# Patient Record
Sex: Male | Born: 1945 | Race: White | Hispanic: No | Marital: Married | Smoking: Current every day smoker
Health system: Southern US, Community
[De-identification: ages and names within clinical notes are randomized; demographics above are authoritative.]

## PROBLEM LIST (undated history)

## (undated) DIAGNOSIS — D494 Neoplasm of unspecified behavior of bladder: Secondary | ICD-10-CM

## (undated) DIAGNOSIS — I714 Abdominal aortic aneurysm, without rupture, unspecified: Secondary | ICD-10-CM

## (undated) DIAGNOSIS — F419 Anxiety disorder, unspecified: Secondary | ICD-10-CM

## (undated) DIAGNOSIS — C679 Malignant neoplasm of bladder, unspecified: Secondary | ICD-10-CM

## (undated) DIAGNOSIS — K219 Gastro-esophageal reflux disease without esophagitis: Secondary | ICD-10-CM

## (undated) DIAGNOSIS — G8929 Other chronic pain: Secondary | ICD-10-CM

## (undated) DIAGNOSIS — C3492 Malignant neoplasm of unspecified part of left bronchus or lung: Principal | ICD-10-CM

## (undated) DIAGNOSIS — I10 Essential (primary) hypertension: Secondary | ICD-10-CM

## (undated) HISTORY — DX: Malignant neoplasm of unspecified part of left bronchus or lung: C34.92

---

## 2011-07-30 ENCOUNTER — Other Ambulatory Visit: Payer: Self-pay | Admitting: Internal Medicine

## 2011-07-30 DIAGNOSIS — K7460 Unspecified cirrhosis of liver: Secondary | ICD-10-CM

## 2011-08-03 ENCOUNTER — Ambulatory Visit
Admission: RE | Admit: 2011-08-03 | Discharge: 2011-08-03 | Disposition: A | Discharge status: Home / Self Care | Payer: Medicare Other | Source: Ambulatory Visit | Attending: Internal Medicine | Admitting: Internal Medicine

## 2011-08-03 DIAGNOSIS — K7460 Unspecified cirrhosis of liver: Secondary | ICD-10-CM

## 2011-08-04 ENCOUNTER — Other Ambulatory Visit: Payer: Self-pay

## 2012-07-27 ENCOUNTER — Other Ambulatory Visit: Payer: Self-pay | Admitting: Internal Medicine

## 2012-07-27 DIAGNOSIS — K824 Cholesterolosis of gallbladder: Secondary | ICD-10-CM

## 2012-07-27 DIAGNOSIS — I714 Abdominal aortic aneurysm, without rupture, unspecified: Secondary | ICD-10-CM

## 2012-07-31 ENCOUNTER — Ambulatory Visit
Admission: RE | Admit: 2012-07-31 | Discharge: 2012-07-31 | Disposition: A | Discharge status: Home / Self Care | Payer: Medicare Other | Source: Ambulatory Visit | Attending: Internal Medicine | Admitting: Internal Medicine

## 2012-07-31 DIAGNOSIS — I714 Abdominal aortic aneurysm, without rupture, unspecified: Secondary | ICD-10-CM

## 2012-07-31 DIAGNOSIS — K824 Cholesterolosis of gallbladder: Secondary | ICD-10-CM

## 2012-08-03 ENCOUNTER — Other Ambulatory Visit: Payer: Self-pay | Admitting: Internal Medicine

## 2012-08-03 ENCOUNTER — Encounter (HOSPITAL_COMMUNITY): Payer: Self-pay | Admitting: Internal Medicine

## 2012-08-03 DIAGNOSIS — I10 Essential (primary) hypertension: Secondary | ICD-10-CM

## 2012-08-09 ENCOUNTER — Ambulatory Visit (HOSPITAL_COMMUNITY)
Admission: RE | Admit: 2012-08-09 | Discharge: 2012-08-09 | Disposition: A | Discharge status: Home / Self Care | Payer: Medicare Other | Source: Ambulatory Visit | Attending: Internal Medicine | Admitting: Internal Medicine

## 2012-08-09 DIAGNOSIS — Z8249 Family history of ischemic heart disease and other diseases of the circulatory system: Secondary | ICD-10-CM

## 2012-08-09 DIAGNOSIS — I10 Essential (primary) hypertension: Secondary | ICD-10-CM

## 2012-08-15 ENCOUNTER — Encounter: Payer: Self-pay | Admitting: Internal Medicine

## 2012-09-14 ENCOUNTER — Other Ambulatory Visit: Payer: Self-pay | Admitting: Urology

## 2012-09-26 ENCOUNTER — Encounter (HOSPITAL_BASED_OUTPATIENT_CLINIC_OR_DEPARTMENT_OTHER): Payer: Self-pay | Admitting: *Deleted

## 2012-09-27 ENCOUNTER — Encounter (HOSPITAL_BASED_OUTPATIENT_CLINIC_OR_DEPARTMENT_OTHER): Payer: Self-pay | Admitting: *Deleted

## 2012-09-27 NOTE — Progress Notes (Addendum)
To [facility] at 0815- istat 8 on arrival ,Ekg in epic-Npo after MN-instructed to take atenolol,omeprazole am of procedure and refrain from smoking.  PT CALLED STATED HE HAD NOT SPOKEN TO ANYONE ABOUT SURGERY.  BUT PT VERBALIZED REMEMBERING SPEAKING TO SHARON LAST WEEK. AND VERBALIZED UNDERSTANDING ABOUT INSTRUCTIONS AS STATED BY SHARON.

## 2012-10-04 ENCOUNTER — Ambulatory Visit (HOSPITAL_COMMUNITY): Payer: Medicare Other

## 2012-10-04 ENCOUNTER — Ambulatory Visit (HOSPITAL_BASED_OUTPATIENT_CLINIC_OR_DEPARTMENT_OTHER)
Admission: RE | Admit: 2012-10-04 | Discharge: 2012-10-04 | Disposition: A | Discharge status: Home / Self Care | Payer: Medicare Other | Source: Ambulatory Visit | Attending: Urology | Admitting: Urology

## 2012-10-04 ENCOUNTER — Encounter (HOSPITAL_BASED_OUTPATIENT_CLINIC_OR_DEPARTMENT_OTHER): Payer: Self-pay | Admitting: Anesthesiology

## 2012-10-04 ENCOUNTER — Ambulatory Visit (HOSPITAL_BASED_OUTPATIENT_CLINIC_OR_DEPARTMENT_OTHER): Payer: Medicare Other | Admitting: Anesthesiology

## 2012-10-04 ENCOUNTER — Encounter (HOSPITAL_BASED_OUTPATIENT_CLINIC_OR_DEPARTMENT_OTHER): Payer: Self-pay | Admitting: *Deleted

## 2012-10-04 ENCOUNTER — Encounter (HOSPITAL_BASED_OUTPATIENT_CLINIC_OR_DEPARTMENT_OTHER)
Admission: RE | Disposition: A | Discharge status: Home / Self Care | Payer: Self-pay | Source: Ambulatory Visit | Attending: Urology

## 2012-10-04 DIAGNOSIS — C672 Malignant neoplasm of lateral wall of bladder: Secondary | ICD-10-CM | POA: Insufficient documentation

## 2012-10-04 DIAGNOSIS — I714 Abdominal aortic aneurysm, without rupture, unspecified: Secondary | ICD-10-CM | POA: Insufficient documentation

## 2012-10-04 DIAGNOSIS — K219 Gastro-esophageal reflux disease without esophagitis: Secondary | ICD-10-CM | POA: Insufficient documentation

## 2012-10-04 DIAGNOSIS — I10 Essential (primary) hypertension: Secondary | ICD-10-CM | POA: Insufficient documentation

## 2012-10-04 DIAGNOSIS — F17200 Nicotine dependence, unspecified, uncomplicated: Secondary | ICD-10-CM | POA: Insufficient documentation

## 2012-10-04 DIAGNOSIS — N35919 Unspecified urethral stricture, male, unspecified site: Secondary | ICD-10-CM | POA: Insufficient documentation

## 2012-10-04 HISTORY — DX: Essential (primary) hypertension: I10

## 2012-10-04 HISTORY — PX: CYSTOSCOPY W/ URETERAL STENT PLACEMENT: SHX1429

## 2012-10-04 HISTORY — PX: MEATOTOMY: SHX5133

## 2012-10-04 HISTORY — DX: Abdominal aortic aneurysm, without rupture, unspecified: I71.40

## 2012-10-04 HISTORY — DX: Anxiety disorder, unspecified: F41.9

## 2012-10-04 HISTORY — DX: Neoplasm of unspecified behavior of bladder: D49.4

## 2012-10-04 HISTORY — DX: Abdominal aortic aneurysm, without rupture: I71.4

## 2012-10-04 HISTORY — PX: TRANSURETHRAL RESECTION OF BLADDER TUMOR WITH GYRUS (TURBT-GYRUS): SHX6458

## 2012-10-04 HISTORY — DX: Gastro-esophageal reflux disease without esophagitis: K21.9

## 2012-10-04 LAB — POCT I-STAT, CHEM 8
Calcium, Ion: 1.23 mmol/L (ref 1.13–1.30)
Chloride: 105 mEq/L (ref 96–112)
Glucose, Bld: 102 mg/dL — ABNORMAL HIGH (ref 70–99)
HCT: 50 % (ref 39.0–52.0)
Hemoglobin: 17 g/dL (ref 13.0–17.0)
TCO2: 26 mmol/L (ref 0–100)

## 2012-10-04 SURGERY — TRANSURETHRAL RESECTION OF BLADDER TUMOR WITH GYRUS (TURBT-GYRUS)
Anesthesia: General | Site: Ureter | Wound class: Clean Contaminated

## 2012-10-04 MED ORDER — SULFAMETHOXAZOLE-TMP DS 800-160 MG PO TABS: 1.0000 | ORAL_TABLET | Freq: Every day | ORAL | Status: DC

## 2012-10-04 MED ORDER — SENNOSIDES-DOCUSATE SODIUM 8.6-50 MG PO TABS: 1.0000 | ORAL_TABLET | Freq: Two times a day (BID) | ORAL | Status: DC

## 2012-10-04 MED ORDER — MIDAZOLAM HCL 5 MG/5ML IJ SOLN
INTRAMUSCULAR | Status: DC | PRN
  Administered 2012-10-04: 2 mg via INTRAVENOUS

## 2012-10-04 MED ORDER — DEXAMETHASONE SODIUM PHOSPHATE 4 MG/ML IJ SOLN
INTRAMUSCULAR | Status: DC | PRN
  Administered 2012-10-04: 10 mg via INTRAVENOUS

## 2012-10-04 MED ORDER — GENTAMICIN IN SALINE 1.6-0.9 MG/ML-% IV SOLN
80.0000 mg | INTRAVENOUS | Status: DC
  Administered 2012-10-04: 400 mg via INTRAVENOUS
  Filled 2012-10-04: qty 50

## 2012-10-04 MED ORDER — OXYBUTYNIN CHLORIDE 5 MG PO TABS
5.0000 mg | ORAL_TABLET | Freq: Three times a day (TID) | ORAL | Status: AC
  Administered 2012-10-04: 5 mg via ORAL
  Filled 2012-10-04: qty 1

## 2012-10-04 MED ORDER — LACTATED RINGERS IV SOLN
INTRAVENOUS | Status: DC
  Administered 2012-10-04 (×3): via INTRAVENOUS
  Filled 2012-10-04: qty 1000

## 2012-10-04 MED ORDER — OXYBUTYNIN CHLORIDE 5 MG PO TABS: 5.0000 mg | ORAL_TABLET | Freq: Three times a day (TID) | ORAL | Status: DC | PRN

## 2012-10-04 MED ORDER — ONDANSETRON HCL 4 MG/2ML IJ SOLN
INTRAMUSCULAR | Status: DC | PRN
  Administered 2012-10-04: 4 mg via INTRAVENOUS

## 2012-10-04 MED ORDER — LACTATED RINGERS IV SOLN
INTRAVENOUS | Status: DC
  Filled 2012-10-04: qty 1000

## 2012-10-04 MED ORDER — NEOSTIGMINE METHYLSULFATE 1 MG/ML IJ SOLN
INTRAMUSCULAR | Status: DC | PRN
  Administered 2012-10-04: 3 mg via INTRAVENOUS

## 2012-10-04 MED ORDER — LIDOCAINE HCL (CARDIAC) 20 MG/ML IV SOLN
INTRAVENOUS | Status: DC | PRN
  Administered 2012-10-04: 80 mg via INTRAVENOUS

## 2012-10-04 MED ORDER — PROMETHAZINE HCL 25 MG/ML IJ SOLN
12.5000 mg | INTRAMUSCULAR | Status: DC | PRN
Start: 2012-10-04 — End: 2012-10-04
  Administered 2012-10-04: 6.25 mg via INTRAVENOUS
  Filled 2012-10-04: qty 1

## 2012-10-04 MED ORDER — PROMETHAZINE HCL 25 MG/ML IJ SOLN
12.5000 mg | INTRAMUSCULAR | Status: DC
  Filled 2012-10-04: qty 1

## 2012-10-04 MED ORDER — FENTANYL CITRATE 0.05 MG/ML IJ SOLN
25.0000 ug | INTRAMUSCULAR | Status: DC | PRN
  Administered 2012-10-04 (×2): 25 ug via INTRAVENOUS
  Filled 2012-10-04: qty 1

## 2012-10-04 MED ORDER — IOHEXOL 300 MG/ML  SOLN
INTRAMUSCULAR | Status: DC | PRN
  Administered 2012-10-04: 20 mL

## 2012-10-04 MED ORDER — SODIUM CHLORIDE 0.9 % IR SOLN
Status: DC | PRN
  Administered 2012-10-04: 6000 mL

## 2012-10-04 MED ORDER — TRAMADOL HCL 50 MG PO TABS: 50.0000 mg | ORAL_TABLET | Freq: Four times a day (QID) | ORAL | Status: DC | PRN

## 2012-10-04 MED ORDER — ROCURONIUM BROMIDE 100 MG/10ML IV SOLN
INTRAVENOUS | Status: DC | PRN
  Administered 2012-10-04: 30 mg via INTRAVENOUS
  Administered 2012-10-04: 5 mg via INTRAVENOUS

## 2012-10-04 MED ORDER — EPHEDRINE SULFATE 50 MG/ML IJ SOLN
INTRAMUSCULAR | Status: DC | PRN
  Administered 2012-10-04 (×2): 10 mg via INTRAVENOUS

## 2012-10-04 MED ORDER — GLYCOPYRROLATE 0.2 MG/ML IJ SOLN
INTRAMUSCULAR | Status: DC | PRN
  Administered 2012-10-04: 0.4 mg via INTRAVENOUS

## 2012-10-04 MED ORDER — PROPOFOL 10 MG/ML IV BOLUS
INTRAVENOUS | Status: DC | PRN
  Administered 2012-10-04: 200 mg via INTRAVENOUS

## 2012-10-04 MED ORDER — FENTANYL CITRATE 0.05 MG/ML IJ SOLN
INTRAMUSCULAR | Status: DC | PRN
  Administered 2012-10-04: 100 ug via INTRAVENOUS

## 2012-10-04 SURGICAL SUPPLY — 37 items
BAG URINE DRAINAGE (UROLOGICAL SUPPLIES) IMPLANT
BAG URINE LEG 19OZ MD ST LTX (BAG) IMPLANT
BAG URINE LEG 500ML (DRAIN) IMPLANT
BAG URO CATCHER STRL LF (DRAPE) ×4 IMPLANT
BASKET LASER NITINOL 1.9FR (BASKET) ×4 IMPLANT
BASKET ZERO TIP NITINOL 2.4FR (BASKET) IMPLANT
CATH FOLEY 2WAY  3CC  8FR (CATHETERS)
CATH FOLEY 2WAY 3CC 8FR (CATHETERS) IMPLANT
CATH FOLEY 2WAY SLVR  5CC 18FR (CATHETERS) ×1
CATH FOLEY 2WAY SLVR  5CC 22FR (CATHETERS)
CATH FOLEY 2WAY SLVR 30CC 20FR (CATHETERS) IMPLANT
CATH FOLEY 2WAY SLVR 5CC 18FR (CATHETERS) ×3 IMPLANT
CATH FOLEY 2WAY SLVR 5CC 22FR (CATHETERS) IMPLANT
CATH INTERMIT  6FR 70CM (CATHETERS) IMPLANT
CLOTH BEACON ORANGE TIMEOUT ST (SAFETY) ×4 IMPLANT
DRAPE CAMERA CLOSED 9X96 (DRAPES) ×4 IMPLANT
ELECT LOOP MED HF 24F 12D CBL (CLIP) ×4 IMPLANT
ELECT REM PT RETURN 9FT ADLT (ELECTROSURGICAL) ×4
ELECT RESECT VAPORIZE 12D CBL (ELECTRODE) IMPLANT
ELECTRODE REM PT RTRN 9FT ADLT (ELECTROSURGICAL) ×3 IMPLANT
EVACUATOR MICROVAS BLADDER (UROLOGICAL SUPPLIES) IMPLANT
GLOVE BIO SURGEON STRL SZ 6.5 (GLOVE) ×4 IMPLANT
GLOVE BIO SURGEON STRL SZ7.5 (GLOVE) ×4 IMPLANT
GLOVE INDICATOR 7.0 STRL GRN (GLOVE) ×4 IMPLANT
GOWN PREVENTION PLUS XLARGE (GOWN DISPOSABLE) ×4 IMPLANT
GOWN STRL NON-REIN LRG LVL3 (GOWN DISPOSABLE) ×4 IMPLANT
GUIDEWIRE ANG ZIPWIRE 038X150 (WIRE) ×4 IMPLANT
GUIDEWIRE STR DUAL SENSOR (WIRE) ×4 IMPLANT
IV NS IRRIG 3000ML ARTHROMATIC (IV SOLUTION) ×8 IMPLANT
KIT ASPIRATION TUBING (SET/KITS/TRAYS/PACK) IMPLANT
PACK CYSTOSCOPY (CUSTOM PROCEDURE TRAY) ×4 IMPLANT
SET ASPIRATION TUBING (TUBING) IMPLANT
STENT URET 6FRX26 CONTOUR (STENTS) ×4 IMPLANT
SUT VIC AB 5-0 RB1 27 (SUTURE) ×4 IMPLANT
SYRINGE 10CC LL (SYRINGE) ×4 IMPLANT
SYRINGE IRR TOOMEY STRL 70CC (SYRINGE) IMPLANT
TUBE FEEDING 8FR 16IN STR KANG (MISCELLANEOUS) IMPLANT

## 2012-10-04 NOTE — Transfer of Care (Signed)
Immediate Anesthesia Transfer of Care Note  Patient: Tyler Mcmillan  Procedure(s) Performed: Procedure(s) (LRB): TRANSURETHRAL RESECTION OF BLADDER TUMOR WITH GYRUS (TURBT-GYRUS) (N/A) CYSTOSCOPY WITH RETROGRADE PYELOGRAM/URETERAL STENT PLACEMENT   "POSSIBLE LEFT STENT" (Left) MEATOTOMY ADULT (N/A)  Patient Location: PACU  Anesthesia Type: General  Level of Consciousness: awake, alert  and oriented  Airway & Oxygen Therapy: Patient Spontanous Breathing and Patient connected to face mask oxygen  Post-op Assessment: Report given to PACU RN and Post -op Vital signs reviewed and stable  Post vital signs: Reviewed and stable  Complications: No apparent anesthesia complications

## 2012-10-04 NOTE — Op Note (Signed)
Tyler Mcmillan, Tyler Mcmillan NO.:  [id number]  MEDICAL RECORD NO.:  [id number]  LOCATION:                                 FACILITY:  PHYSICIAN:  Sebastian Ache, MD     DATE OF BIRTH:  1945-12-29  DATE OF PROCEDURE: 10/04/2012 DATE OF DISCHARGE:                              OPERATIVE REPORT   PREOPERATIVE DIAGNOSIS:  Bladder cancer.  POSTOPERATIVE DIAGNOSIS:  Bladder cancer plus meatal stenosis.  PROCEDURE: 1. Transurethral resection of bladder tumor, volume small. 2. Bilateral retrograde pyelograms with interpretation. 3. Left ureteral stent placement, 6 x 26, no tether. 4. Meatotomy.  FINDINGS: 1. Papillary tumor just adjacent to the left ureteral orifice.  There     are some papillary changes extending medial and distal towards the     bladder neck. 2. Unremarkable retrograde pyelograms. 3. Meatal stenosis necessitating meatotomy for placement of     resectoscope.  SPECIMEN:  Bladder tumor.  INDICATION:  [name] is a pleasant 67 year old gentleman, who was found on workup of hematuria to have a left side of the bladder wall papular lesion, worrisome for bladder cancer.  This was very close proximity to his left ureteral orifice.  Options were discussed including operative endoscopic examination with goal of obtaining tissue and staging and he wished to proceed.  We had counseled him extensively preoperatively about the location of the tumor that very well may require perioperative stenting.  He voiced understanding and wished to proceed.  Informed consent was obtained and placed in medical record.  DESCRIPTION OF PROCEDURE:  The patient being Tyler Mcmillan was verified. Procedure being transitional bladder tumor was confirmed.  Procedure was carried out.  Time-out was performed.  Intravenous antibiotics were administered.  General endotracheal anesthesia was introduced.  The patient was placed into a low lithotomy position.  Sterile field was created  by prepping the patient's penis, perineum, and proximal thighs using iodine x3.  Next, cystourethroscopy was performed using a 22- French rigid cystoscope with 12-degree offset lens.  The patient was noted to have somewhat narrow meatus at this point.  We did accommodate 22-French cystoscope.  Inspection of the anterior and posterior urethra unremarkable.  Inspection of the bladder revealed a solitary papillary appearing lesion approximately 1 cm in diameter just lateral to the left ureteral orifice.  There was some papillary changes distal to this towards the bladder neck worrisome for possible neoplasm as well. Attention was directed to the retrograde pyelography 1st on the right side.  The right ureter was cannulated with a 6-French end-hole catheter and right retrograde pyelogram was obtained.  Retrograde pyelogram demonstrated a single right ureter with single system right kidney.  No filling defects or narrowing noted.  Similarly, left retrograde pyelogram was obtained.  Left retrograde pyelogram demonstrated a single left ureter, single system left kidney.  No filling defects or narrowing noted.  Next, the cystoscope was then exchanged for a 26-French ACMI continuous flow resectoscope sheath, however, the urethral meatus would not accommodate this and after visualization of the urethra, it was felt that this was just due to relative meatal stenosis as such meatotomy was performed. The anterior aspect of the meatus was clamped  with the hemostats, held for 90 seconds and then cut coldly for distance approximately 5 mm thus performing meatotomy and this did then allow easy pass through the resectoscope sheath, visual obturator to the level of urinary bladder. Then, using a medium size bipolar resectoscope loop, careful resection was performed of this papillary lesion as well as papillary changes towards the bladder neck. This was set aside for permanent pathology.  Purposely deep  biopsies were not taken as it was felt that this would a compromising integrity of ureteral orifices.  Given the location of the tumor, it was felt that left ureteral stenting was warranted.  As such, a 0.038 wire was advanced at the level of left renal pelvis over which a new 6 x 26 double-J stent was placed.  Good proximal and distal curl were noted. Bladder was emptied per cystoscope.  The scope was removed.  Upon removal of the scope, the meatotomy area was inspected.  There was some slight bleeding at this location.  Mucosal edges were not approximated. I felt that figure-of-eight stitching was warranted as such a 5-0 Vicryl was used to place a figure-of-eight stitch on the left and right side of the previous meatotomy, thus bringing mucosal edges into apposition providing excellent hemostasis.  This still allowed easy calibration with the resectoscope sheath.  The procedure was then terminated.  The patient tolerated the procedure well.  There were no immediate periprocedural complications.  The patient was taken to postanesthesia care in stable condition.          ______________________________ Sebastian Ache, MD     TM/MEDQ  D:  10/04/2012  T:  10/04/2012  Job:  [id number]

## 2012-10-04 NOTE — Brief Op Note (Signed)
10/04/2012  11:27 AM  PATIENT:  Tyler Mcmillan  67 y.o. male  PRE-OPERATIVE DIAGNOSIS:  BLADDER NEOPLASM  POST-OPERATIVE DIAGNOSIS:  BLADDER NEOPLASM  PROCEDURE:  Procedure(s): TRANSURETHRAL RESECTION OF BLADDER TUMOR WITH GYRUS (TURBT-GYRUS) (N/A) CYSTOSCOPY WITH RETROGRADE PYELOGRAM/URETERAL STENT PLACEMENT   "POSSIBLE LEFT STENT" (Left) MEATOTOMY ADULT (N/A)  SURGEON:  Surgeon(s) and Role:    * Sebastian Ache, MD - Primary  PHYSICIAN ASSISTANT:   ASSISTANTS: none   ANESTHESIA:   general  EBL:  Total I/O In: 1000 [I.V.:1000] Out: -   BLOOD ADMINISTERED:none  DRAINS: none   LOCAL MEDICATIONS USED:  NONE  SPECIMEN:  Source of Specimen:  1 - Left Wall Bladder Tumor  DISPOSITION OF SPECIMEN:  PATHOLOGY  COUNTS:  YES  TOURNIQUET:  * No tourniquets in log *  DICTATION: .Other Dictation: Dictation Number  [id number]  PLAN OF CARE: Discharge to home after PACU  PATIENT DISPOSITION:  PACU - hemodynamically stable.   Delay start of Pharmacological VTE agent (>24hrs) due to surgical blood loss or risk of bleeding: yes

## 2012-10-04 NOTE — Anesthesia Preprocedure Evaluation (Addendum)
Anesthesia Evaluation  Patient identified by MRN, date of birth, ID band Patient awake    Reviewed: Allergy & Precautions, H&P , NPO status , Patient's Chart, lab work & pertinent test results, reviewed documented beta blocker date and time   Airway Mallampati: II TM Distance: >3 FB Neck ROM: full    Dental  (+) Caps and Dental Advisory Given 2 upper front and right lateral upper incissor next to them are capped:   Pulmonary neg pulmonary ROS, Current Smoker,  breath sounds clear to auscultation  Pulmonary exam normal       Cardiovascular Exercise Tolerance: Good hypertension, Pt. on home beta blockers Rhythm:regular Rate:Normal  AAA 3.5 cm   Neuro/Psych Anxiety negative neurological ROS  negative psych ROS   GI/Hepatic negative GI ROS, Neg liver ROS, GERD-  Medicated and Controlled,  Endo/Other  negative endocrine ROS  Renal/GU negative Renal ROS  negative genitourinary   Musculoskeletal   Abdominal   Peds  Hematology negative hematology ROS (+)   Anesthesia Other Findings   Reproductive/Obstetrics negative OB ROS                          Anesthesia Physical Anesthesia Plan  ASA: III  Anesthesia Plan: General   Post-op Pain Management:    Induction: Intravenous  Airway Management Planned: Oral ETT  Additional Equipment:   Intra-op Plan:   Post-operative Plan: Extubation in OR  Informed Consent: I have reviewed the patients History and Physical, chart, labs and discussed the procedure including the risks, benefits and alternatives for the proposed anesthesia with the patient or authorized representative who has indicated his/her understanding and acceptance.   Dental Advisory Given  Plan Discussed with: CRNA and Surgeon  Anesthesia Plan Comments:        Anesthesia Quick Evaluation

## 2012-10-04 NOTE — Anesthesia Postprocedure Evaluation (Signed)
  Anesthesia Post-op Note  Patient: Tyler Mcmillan  Procedure(s) Performed: Procedure(s) (LRB): TRANSURETHRAL RESECTION OF BLADDER TUMOR WITH GYRUS (TURBT-GYRUS) (N/A) CYSTOSCOPY WITH RETROGRADE PYELOGRAM/URETERAL STENT PLACEMENT   "POSSIBLE LEFT STENT" (Left) MEATOTOMY ADULT (N/A)  Patient Location: PACU  Anesthesia Type: General  Level of Consciousness: awake and alert   Airway and Oxygen Therapy: Patient Spontanous Breathing  Post-op Pain: mild  Post-op Assessment: Post-op Vital signs reviewed, Patient's Cardiovascular Status Stable, Respiratory Function Stable, Patent Airway and No signs of Nausea or vomiting  Last Vitals:  Filed Vitals:   10/04/12 1145  BP: 120/70  Pulse: 70  Temp:   Resp: 14    Post-op Vital Signs: stable   Complications: No apparent anesthesia complications

## 2012-10-04 NOTE — Anesthesia Procedure Notes (Signed)
Procedure Name: Intubation Performed by: Briant Sites Pre-anesthesia Checklist: Patient identified, Emergency Drugs available, Suction available and Patient being monitored Patient Re-evaluated:Patient Re-evaluated prior to inductionOxygen Delivery Method: Circle System Utilized Preoxygenation: Pre-oxygenation with 100% oxygen Intubation Type: IV induction Ventilation: Mask ventilation without difficulty Grade View: Grade III Tube type: Oral Tube size: 8.0 mm Number of attempts: 4 Airway Equipment and Method: stylet and oral airway Placement Confirmation: ETT inserted through vocal cords under direct vision,  positive ETCO2 and breath sounds checked- equal and bilateral Secured at: 22 cm Tube secured with: Tape Dental Injury: Teeth and Oropharynx as per pre-operative assessment and Injury to lip  Difficulty Due To: Difficult Airway- due to immobile epiglottis and Difficult Airway- due to anterior larynx Comments: DL with Mac 4 x2  Epiglottis rigid.  Easily ventilated, Intubated with glidescope on 2nd attempt.  Small cut on right upper lip, no dental damage.

## 2012-10-04 NOTE — H&P (Signed)
Tyler Mcmillan is an 67 y.o. male.    Chief Complaint: Pre-Op Transurethral Resection Bladder Tumor  HPI:  1 - Microscopic Hematuria / Bladder Neoplasm - Pt wtih blood on UA x several. 50PY smoker, still smokes. No dye/textile/rubber exposure. Cysto 7/14 by [facility] wtih suspisious polyploid lesion adjacent to left ureteral orifice. CT Urogram 08/2012 unremarkable.  2 - Lower Urinary Tract Symptoms - Pt on observation for slowly increasing obstructive and irritative symptoms presently of minimal bother. PVR 08/2012 55mL. DRE 45gm.  3 - Prostate Screening -  07/2012 - PSA 0.96 (PCP)  PMH sig for <4cm infrarenal AAA (surveillance), No CV disease. No strong blood thinners.  Today Tyler Mcmillan is seen to proceed with transurethral resection of bladder tumor. No interval fevers or gross hematuria. Recent UCX from office negative.  Past Medical History  Diagnosis Date  . Hypertension   . Bladder neoplasm   . AAA (abdominal aortic aneurysm) without rupture     [place] ABD.  DONE  JUNE 2014  3.5  . Chronic anxiety   . GERD (gastroesophageal reflux disease)     History reviewed. No pertinent past surgical history.  History reviewed. No pertinent family history. Social History:  reports that he has been smoking Cigarettes.  He has been smoking about 0.25 packs per day. He does not have any smokeless tobacco history on file. His alcohol and drug histories are not on file.  Allergies: No Known Allergies  No prescriptions prior to admission    No results found for this or any previous visit (from the past 48 hour(s)). No results found.  Review of Systems  Constitutional: Negative.  Negative for fever, chills and malaise/fatigue.  HENT: Negative.   Eyes: Negative.   Respiratory: Negative.   Cardiovascular: Negative.   Gastrointestinal: Negative.   Genitourinary: Negative.  Negative for flank pain.  Musculoskeletal: Negative.   Skin: Negative.   Neurological: Negative.   Endo/Heme/Allergies:  Negative.   Psychiatric/Behavioral: Negative.     Height 5\' 10"  (1.778 m), weight 83.915 kg (185 lb). Physical Exam  Constitutional: He is oriented to person, place, and time. He appears well-developed and well-nourished.  HENT:  Head: Normocephalic and atraumatic.  Eyes: EOM are normal. Pupils are equal, round, and reactive to light.  Neck: Normal range of motion. Neck supple.  Cardiovascular: Normal rate.   Respiratory: Effort normal.  GI: Soft. Bowel sounds are normal.  Genitourinary: Penis normal.  Neurological: He is alert and oriented to person, place, and time.  Skin: Skin is warm and dry.  Psychiatric: He has a normal mood and affect. His behavior is normal. Judgment and thought content normal.     Assessment/Plan  1 - Miccroscopic Hematuria / Bladder Neoplasm - Overall picture worrisome for early bladder cancer.   We rediscussed operative biopsy / transurethral resection as the best next step for diagnostic and therapeutic purposes with goals being to remove all visible cancer and obtain tissue for pathologic exam. We rediscussed that for some low-grade tumors, this may be all the treatment required, but that for many other tumors such as high-grade lesions, further therapy including surgery and or chemotherapy may be warranted. We also outlined the fact that any bladder cancer diagnosis will require close follow-up with periodic upper and lower tract evaluation. We rediscussed risks including bleeding, infection, damage to kidney / ureter / bladder including bladder perforation which can typically managed with prolonged foley catheterization. We rementioned anesthetic and other rare risks including DVT, PE, MI, and mortality. I also rementioned  that adjunctive procedures such as ureteral stenting, retrograde pyelography, and ureteroscopy may be necessary to fully evaluate the urinary tract depending on intra-operative findings. After answering all questions to the patient's  satisfaction, they wish to proceed.   I reiterated the possibility of left ureteral stenting based on left wall tumor location if needed.  2 - Lower Urinary Tract Symptoms - Continue observation, he understands there are medical and surgical therapies should symptoms become more bothersome.  3 - Prostate Screening - up to date this year, continue annual screening.  Tyler Mcmillan 10/04/2012, 6:26 AM

## 2012-10-05 ENCOUNTER — Encounter (HOSPITAL_BASED_OUTPATIENT_CLINIC_OR_DEPARTMENT_OTHER): Payer: Self-pay | Admitting: Urology

## 2013-05-03 ENCOUNTER — Other Ambulatory Visit: Payer: Self-pay | Admitting: Gastroenterology

## 2013-06-17 ENCOUNTER — Ambulatory Visit (INDEPENDENT_AMBULATORY_CARE_PROVIDER_SITE_OTHER): Payer: Medicare Other | Admitting: Family Medicine

## 2013-06-17 ENCOUNTER — Encounter: Payer: Self-pay | Admitting: Family Medicine

## 2013-06-17 ENCOUNTER — Ambulatory Visit: Payer: Medicare Other

## 2013-06-17 VITALS — BP 136/84 | HR 64 | Temp 98.2°F | Resp 16 | Ht 70.0 in | Wt 190.0 lb

## 2013-06-17 DIAGNOSIS — R059 Cough, unspecified: Secondary | ICD-10-CM

## 2013-06-17 DIAGNOSIS — Z720 Tobacco use: Secondary | ICD-10-CM

## 2013-06-17 DIAGNOSIS — J22 Unspecified acute lower respiratory infection: Secondary | ICD-10-CM

## 2013-06-17 DIAGNOSIS — R05 Cough: Secondary | ICD-10-CM

## 2013-06-17 DIAGNOSIS — J988 Other specified respiratory disorders: Secondary | ICD-10-CM

## 2013-06-17 DIAGNOSIS — J9801 Acute bronchospasm: Secondary | ICD-10-CM

## 2013-06-17 DIAGNOSIS — F17200 Nicotine dependence, unspecified, uncomplicated: Secondary | ICD-10-CM

## 2013-06-17 MED ORDER — AZITHROMYCIN 250 MG PO TABS: ORAL_TABLET | ORAL | Status: DC

## 2013-06-17 MED ORDER — PREDNISONE 20 MG PO TABS: 40.0000 mg | ORAL_TABLET | Freq: Every day | ORAL | Status: DC

## 2013-06-17 MED ORDER — ALBUTEROL SULFATE HFA 108 (90 BASE) MCG/ACT IN AERS: 2.0000 | INHALATION_SPRAY | Freq: Four times a day (QID) | RESPIRATORY_TRACT | Status: DC | PRN

## 2013-06-17 NOTE — Progress Notes (Addendum)
Subjective:  This chart was scribed for Tyler Ray, MD by Tyler Mcmillan, Scribe.  This patient was seen in [place] [facility] and the patient's care was started at 5:06 PM.   Patient ID: Tyler Mcmillan, male    DOB: 1945/10/23, 68 y.o.   MRN: [id number]  HPI  Tyler Mcmillan is a 68 y.o. male PCP: Tyler Pel, MD   Pt presents with a 12-day history of persistent unchanged cough and chest congestion.  He states he woke up 12 days ago with his chest "tight" and a subjective fever.  When he checked his temperature that day it was 100.7 F.  He has had no other fever since then but has continued to have chest congestion and cough productive of white sputum.  He also reports some nasal congestion and rhinorrhea.  In addition he complains of some wheezing when he lies down on his side.  He states his chest feels mildly "tight" but denies chest pain or pain radiating into arms.  He has been using Robitussin and Zyrtec, without relief.  He is a current 1/3-pack-per-day smoker and former long-term one-pack-per-day smoker.  He denies prior h/o similar symptoms or any chronic respiratory conditions.  He notes that he was diagnosed with a bladder neoplasm about 8 months ago and received a full body CT scan and was told his lungs appeared normal at that time.  PCP is Tyler Pel, MD  Next appt with Tyler Mcmillan in July - glucose ok in [place] last year, no hx of diabetes.    There are no active problems to display for this patient.   Past Medical History  Diagnosis Date  . Hypertension   . Bladder neoplasm   . AAA (abdominal aortic aneurysm) without rupture     [place] ABD.  DONE  JUNE 2014  3.5  . Chronic anxiety   . GERD (gastroesophageal reflux disease)     Past Surgical History  Procedure Laterality Date  . Transurethral resection of bladder tumor with gyrus (turbt-gyrus) N/A 10/04/2012    Procedure: TRANSURETHRAL RESECTION OF BLADDER TUMOR WITH GYRUS (TURBT-GYRUS);  Surgeon: Tyler Frock, MD;  Location: [facility];  Service: Urology;  Laterality: N/A;  . Cystoscopy w/ ureteral stent placement Left 10/04/2012    Procedure: CYSTOSCOPY WITH RETROGRADE PYELOGRAM/URETERAL STENT PLACEMENT   "POSSIBLE LEFT STENT";  Surgeon: Tyler Frock, MD;  Location: [facility];  Service: Urology;  Laterality: Left;  [name] Meatotomy N/A 10/04/2012    Procedure: MEATOTOMY ADULT;  Surgeon: Tyler Frock, MD;  Location: [facility];  Service: Urology;  Laterality: N/A;    No Known Allergies   Prior to Admission medications   Medication Sig Start Date End Date Taking? Authorizing Provider  atenolol (TENORMIN) 50 MG tablet Take 50 mg by mouth every morning.   Yes Historical Provider, MD  clonazePAM (KLONOPIN) 2 MG tablet Take 1-2 mg by mouth daily as needed for anxiety.   Yes Historical Provider, MD  omeprazole (PRILOSEC) 20 MG capsule Take 20 mg by mouth as needed.     Historical Provider, MD  oxybutynin (DITROPAN) 5 MG tablet Take 1 tablet (5 mg total) by mouth every 8 (eight) hours as needed. For bladder spasms / stent discomfort 10/04/12   Tyler Frock, MD  senna-docusate (SENOKOT-S) 8.6-50 MG per tablet Take 1 tablet by mouth 2 (two) times daily. While taking pain medications to prevent constipation 10/04/12   Tyler Frock, MD  sulfamethoxazole-trimethoprim (BACTRIM DS) 800-160 MG per tablet  Take 1 tablet by mouth daily. X 3 days. Start day prior to next Urology appointment 10/04/12   Tyler Frock, MD  traMADol (ULTRAM) 50 MG tablet Take 1-2 tablets (50-100 mg total) by mouth every 6 (six) hours as needed for pain. 10/04/12   Tyler Frock, MD    History   Social History  . Marital Status: Married    Spouse Name: N/A    Number of Children: N/A  . Years of Education: N/A   Occupational History  . Not on file.   Social History Main Topics  . Smoking status: Current Every Day Smoker -- 0.25 packs/day    Types: Cigarettes  . Smokeless  tobacco: Not on file  . Alcohol Use: Not on file  . Drug Use: Not on file  . Sexual Activity: Not on file   Other Topics Concern  . Not on file   Social History Narrative  . No narrative on file     Review of Systems  HENT: Positive for congestion and rhinorrhea.   Respiratory: Positive for cough, chest tightness and wheezing.   Cardiovascular: Negative for chest pain.         Objective:   Physical Exam  Vitals reviewed. Constitutional: He is oriented to person, place, and time. He appears well-developed and well-nourished.  HENT:  Head: Normocephalic and atraumatic.  Right Ear: Tympanic membrane, external ear and ear canal normal.  Left Ear: Tympanic membrane, external ear and ear canal normal.  Nose: No rhinorrhea.  Mouth/Throat: Oropharynx is clear and moist and mucous membranes are normal. No oropharyngeal exudate or posterior oropharyngeal erythema.  Eyes: Conjunctivae are normal. Pupils are equal, round, and reactive to light.  Neck: Neck supple.  Cardiovascular: Normal rate, regular rhythm, normal heart sounds and intact distal pulses.  Exam reveals no gallop and no friction rub.   No murmur heard. Pulmonary/Chest: Effort normal. He has wheezes. He has no rhonchi. He has no rales.  Single faint expiratory wheeze on the right lower lobe  Abdominal: Soft. There is no tenderness.  Musculoskeletal:  No lower extremity edema  Lymphadenopathy:    He has no cervical adenopathy.  Neurological: He is alert and oriented to person, place, and time.  Skin: Skin is warm and dry. No rash noted.  Psychiatric: He has a normal mood and affect. His behavior is normal.     Filed Vitals:   06/17/13 1548  BP: 136/84  Pulse: 64  Temp: 98.2 F (36.8 C)  TempSrc: Oral  Resp: 16  Height: 5\' 10"  (1.778 m)  Weight: 190 lb (86.183 kg)  SpO2: 97%   [facility] reading (PRIMARY) by  Dr. Carlota Mcmillan: CXR: hyper expanded, flattened diaphragms. Scar vs increased markings RLL>LLL.         Assessment & Plan:   Tyler Mcmillan is a 68 y.o. male Cough - Plan: DG Chest 2 View, albuterol (PROVENTIL HFA;VENTOLIN HFA) 108 (90 BASE) MCG/ACT inhaler, predniSONE (DELTASONE) 20 MG tablet  LRTI (lower respiratory tract infection) - Plan: DG Chest 2 View, azithromycin (ZITHROMAX) 250 MG tablet, predniSONE (DELTASONE) 20 MG tablet  Tobacco abuse - Plan: DG Chest 2 View, predniSONE (DELTASONE) 20 MG tablet  Bronchospasm - Plan: albuterol (PROVENTIL HFA;VENTOLIN HFA) 108 (90 BASE) MCG/ACT inhaler, predniSONE (DELTASONE) 20 MG tablet   Cough x 12 days, with wheeze at night and tight cough - LRTI/ bronchitis vs CAP.  cxr and tobacco hx concerning for COPD, but can have further testing/spirometry by PCP. Start Z pak, albuterol up to every  6 hrs as needed (technique discussed), then start prednisone in 2 days if not improved - SED. Rtc/er precautions discussed, including if any worsening of cough, dyspnea or new chest symptoms.   Meds ordered this encounter  Medications  . azithromycin (ZITHROMAX) 250 MG tablet    Sig: Take 2 pills by mouth on day 1, then 1 pill by mouth per day on days 2 through 5.    Dispense:  6 each    Refill:  0  . albuterol (PROVENTIL HFA;VENTOLIN HFA) 108 (90 BASE) MCG/ACT inhaler    Sig: Inhale 2 puffs into the lungs every 6 (six) hours as needed for wheezing or shortness of breath.    Dispense:  1 Inhaler    Refill:  0  . predniSONE (DELTASONE) 20 MG tablet    Sig: Take 2 tablets (40 mg total) by mouth daily with breakfast.    Dispense:  10 tablet    Refill:  0   Patient Instructions  You appear to have a bronchitis at this point, but some signs of possible COPD.  This can be discussed further with your primary care doctor and further testing can be discussed at that time if needed. Continue to cut back on tobacco use with goal of stopping altogether.  For your current cough - start the Zpak, albuterol if needed every 4-6 hours for tight cough or wheezing, and if  not improving in next 2 days - can start prednisone.   Return to the clinic or go to the nearest emergency room if any of your symptoms worsen or new symptoms occur. Cough, Adult  A cough is a reflex that helps clear your throat and airways. It can help heal the body or may be a reaction to an irritated airway. A cough may only last 2 or 3 weeks (acute) or may last more than 8 weeks (chronic).  CAUSES Acute cough:  Viral or bacterial infections. Chronic cough:  Infections.  Allergies.  Asthma.  Post-nasal drip.  Smoking.  Heartburn or acid reflux.  Some medicines.  Chronic lung problems (COPD).  Cancer. SYMPTOMS   Cough.  Fever.  Chest pain.  Increased breathing rate.  High-pitched whistling sound when breathing (wheezing).  Colored mucus that you cough up (sputum). TREATMENT   A bacterial cough may be treated with antibiotic medicine.  A viral cough must run its course and will not respond to antibiotics.  Your caregiver may recommend other treatments if you have a chronic cough. HOME CARE INSTRUCTIONS   Only take over-the-counter or prescription medicines for pain, discomfort, or fever as directed by your caregiver. Use cough suppressants only as directed by your caregiver.  Use a cold steam vaporizer or humidifier in your bedroom or home to help loosen secretions.  Sleep in a semi-upright position if your cough is worse at night.  Rest as needed.  Stop smoking if you smoke. SEEK IMMEDIATE MEDICAL CARE IF:   You have pus in your sputum.  Your cough starts to worsen.  You cannot control your cough with suppressants and are losing sleep.  You begin coughing up blood.  You have difficulty breathing.  You develop pain which is getting worse or is uncontrolled with medicine.  You have a fever. MAKE SURE YOU:   Understand these instructions.  Will watch your condition.  Will get help right away if you are not doing well or get  worse. Document Released: 07/24/2010 Document Revised: 04/19/2011 Document Reviewed: 07/24/2010 [facility] Patient Information 2014 [place].  Bronchitis Bronchitis is inflammation of the airways that extend from the windpipe into the lungs (bronchi). The inflammation often causes mucus to develop, which leads to a cough. If the inflammation becomes severe, it may cause shortness of breath. CAUSES  Bronchitis may be caused by:   Viral infections.   Bacteria.   Cigarette smoke.   Allergens, pollutants, and other irritants.  SIGNS AND SYMPTOMS  The most common symptom of bronchitis is a frequent cough that produces mucus. Other symptoms include:  Fever.   Body aches.   Chest congestion.   Chills.   Shortness of breath.   Sore throat.  DIAGNOSIS  Bronchitis is usually diagnosed through a medical history and physical exam. Tests, such as chest X-rays, are sometimes done to rule out other conditions.  TREATMENT  You may need to avoid contact with whatever caused the problem (smoking, for example). Medicines are sometimes needed. These may include:  Antibiotics. These may be prescribed if the condition is caused by bacteria.  Cough suppressants. These may be prescribed for relief of cough symptoms.   Inhaled medicines. These may be prescribed to help open your airways and make it easier for you to breathe.   Steroid medicines. These may be prescribed for those with recurrent (chronic) bronchitis. HOME CARE INSTRUCTIONS  Get plenty of rest.   Drink enough fluids to keep your urine clear or pale yellow (unless you have a medical condition that requires fluid restriction). Increasing fluids may help thin your secretions and will prevent dehydration.   Only take over-the-counter or prescription medicines as directed by your health care provider.  Only take antibiotics as directed. Make sure you finish them even if you start to feel better.  Avoid  secondhand smoke, irritating chemicals, and strong fumes. These will make bronchitis worse. If you are a smoker, quit smoking. Consider using nicotine gum or skin patches to help control withdrawal symptoms. Quitting smoking will help your lungs heal faster.   Put a cool-mist humidifier in your bedroom at night to moisten the air. This may help loosen mucus. Change the water in the humidifier daily. You can also run the hot water in your shower and sit in the bathroom with the door closed for 5 10 minutes.   Follow up with your health care provider as directed.   Wash your hands frequently to avoid catching bronchitis again or spreading an infection to others.  SEEK MEDICAL CARE IF: Your symptoms do not improve after 1 week of treatment.  SEEK IMMEDIATE MEDICAL CARE IF:  Your fever increases.  You have chills.   You have chest pain.   You have worsening shortness of breath.   You have bloody sputum.  You faint.  You have lightheadedness.  You have a severe headache.   You vomit repeatedly. MAKE SURE YOU:   Understand these instructions.  Will watch your condition.  Will get help right away if you are not doing well or get worse. Document Released: 01/25/2005 Document Revised: 11/15/2012 Document Reviewed: 09/19/2012 [facility] Patient Information 2014 [place].      I personally performed the services described in this documentation, which was scribed in my presence. The recorded information has been reviewed and considered, and addended by me as needed.

## 2013-06-17 NOTE — Patient Instructions (Addendum)
You appear to have a bronchitis at this point, but some signs of possible COPD.  This can be discussed further with your primary care doctor and further testing can be discussed at that time if needed. Continue to cut back on tobacco use with goal of stopping altogether.  For your current cough - start the Zpak, albuterol if needed every 4-6 hours for tight cough or wheezing, and if not improving in next 2 days - can start prednisone.   Return to the clinic or go to the nearest emergency room if any of your symptoms worsen or new symptoms occur. Cough, Adult  A cough is a reflex that helps clear your throat and airways. It can help heal the body or may be a reaction to an irritated airway. A cough may only last 2 or 3 weeks (acute) or may last more than 8 weeks (chronic).  CAUSES Acute cough:  Viral or bacterial infections. Chronic cough:  Infections.  Allergies.  Asthma.  Post-nasal drip.  Smoking.  Heartburn or acid reflux.  Some medicines.  Chronic lung problems (COPD).  Cancer. SYMPTOMS   Cough.  Fever.  Chest pain.  Increased breathing rate.  High-pitched whistling sound when breathing (wheezing).  Colored mucus that you cough up (sputum). TREATMENT   A bacterial cough may be treated with antibiotic medicine.  A viral cough must run its course and will not respond to antibiotics.  Your caregiver may recommend other treatments if you have a chronic cough. HOME CARE INSTRUCTIONS   Only take over-the-counter or prescription medicines for pain, discomfort, or fever as directed by your caregiver. Use cough suppressants only as directed by your caregiver.  Use a cold steam vaporizer or humidifier in your bedroom or home to help loosen secretions.  Sleep in a semi-upright position if your cough is worse at night.  Rest as needed.  Stop smoking if you smoke. SEEK IMMEDIATE MEDICAL CARE IF:   You have pus in your sputum.  Your cough starts to worsen.  You  cannot control your cough with suppressants and are losing sleep.  You begin coughing up blood.  You have difficulty breathing.  You develop pain which is getting worse or is uncontrolled with medicine.  You have a fever. MAKE SURE YOU:   Understand these instructions.  Will watch your condition.  Will get help right away if you are not doing well or get worse. Document Released: 07/24/2010 Document Revised: 04/19/2011 Document Reviewed: 07/24/2010 [facility] Patient Information 2014 [place].  Bronchitis Bronchitis is inflammation of the airways that extend from the windpipe into the lungs (bronchi). The inflammation often causes mucus to develop, which leads to a cough. If the inflammation becomes severe, it may cause shortness of breath. CAUSES  Bronchitis may be caused by:   Viral infections.   Bacteria.   Cigarette smoke.   Allergens, pollutants, and other irritants.  SIGNS AND SYMPTOMS  The most common symptom of bronchitis is a frequent cough that produces mucus. Other symptoms include:  Fever.   Body aches.   Chest congestion.   Chills.   Shortness of breath.   Sore throat.  DIAGNOSIS  Bronchitis is usually diagnosed through a medical history and physical exam. Tests, such as chest X-rays, are sometimes done to rule out other conditions.  TREATMENT  You may need to avoid contact with whatever caused the problem (smoking, for example). Medicines are sometimes needed. These may include:  Antibiotics. These may be prescribed if the condition is caused  by bacteria.  Cough suppressants. These may be prescribed for relief of cough symptoms.   Inhaled medicines. These may be prescribed to help open your airways and make it easier for you to breathe.   Steroid medicines. These may be prescribed for those with recurrent (chronic) bronchitis. HOME CARE INSTRUCTIONS  Get plenty of rest.   Drink enough fluids to keep your urine clear or pale  yellow (unless you have a medical condition that requires fluid restriction). Increasing fluids may help thin your secretions and will prevent dehydration.   Only take over-the-counter or prescription medicines as directed by your health care provider.  Only take antibiotics as directed. Make sure you finish them even if you start to feel better.  Avoid secondhand smoke, irritating chemicals, and strong fumes. These will make bronchitis worse. If you are a smoker, quit smoking. Consider using nicotine gum or skin patches to help control withdrawal symptoms. Quitting smoking will help your lungs heal faster.   Put a cool-mist humidifier in your bedroom at night to moisten the air. This may help loosen mucus. Change the water in the humidifier daily. You can also run the hot water in your shower and sit in the bathroom with the door closed for 5 10 minutes.   Follow up with your health care provider as directed.   Wash your hands frequently to avoid catching bronchitis again or spreading an infection to others.  SEEK MEDICAL CARE IF: Your symptoms do not improve after 1 week of treatment.  SEEK IMMEDIATE MEDICAL CARE IF:  Your fever increases.  You have chills.   You have chest pain.   You have worsening shortness of breath.   You have bloody sputum.  You faint.  You have lightheadedness.  You have a severe headache.   You vomit repeatedly. MAKE SURE YOU:   Understand these instructions.  Will watch your condition.  Will get help right away if you are not doing well or get worse. Document Released: 01/25/2005 Document Revised: 11/15/2012 Document Reviewed: 09/19/2012 [facility] Patient Information 2014 [place].

## 2013-08-30 ENCOUNTER — Ambulatory Visit: Payer: Medicare Other | Admitting: Neurology

## 2014-01-16 ENCOUNTER — Other Ambulatory Visit: Payer: Self-pay | Admitting: Internal Medicine

## 2014-01-16 DIAGNOSIS — I714 Abdominal aortic aneurysm, without rupture, unspecified: Secondary | ICD-10-CM

## 2014-03-06 ENCOUNTER — Other Ambulatory Visit: Payer: Medicare Other

## 2014-03-13 ENCOUNTER — Ambulatory Visit
Admission: RE | Admit: 2014-03-13 | Discharge: 2014-03-13 | Disposition: A | Discharge status: Home / Self Care | Payer: Medicare Other | Source: Ambulatory Visit | Attending: Internal Medicine | Admitting: Internal Medicine

## 2014-03-13 DIAGNOSIS — I714 Abdominal aortic aneurysm, without rupture, unspecified: Secondary | ICD-10-CM

## 2014-03-22 ENCOUNTER — Other Ambulatory Visit: Payer: Self-pay | Admitting: Internal Medicine

## 2014-03-22 DIAGNOSIS — I714 Abdominal aortic aneurysm, without rupture, unspecified: Secondary | ICD-10-CM

## 2014-06-20 ENCOUNTER — Encounter: Payer: Self-pay | Admitting: Internal Medicine

## 2014-07-05 ENCOUNTER — Encounter: Payer: Self-pay | Admitting: Vascular Surgery

## 2014-07-09 ENCOUNTER — Ambulatory Visit (INDEPENDENT_AMBULATORY_CARE_PROVIDER_SITE_OTHER): Payer: Medicare Other | Admitting: Vascular Surgery

## 2014-07-09 ENCOUNTER — Encounter: Payer: Self-pay | Admitting: Vascular Surgery

## 2014-07-09 VITALS — BP 123/85 | HR 80 | Ht 70.0 in | Wt 200.0 lb

## 2014-07-09 DIAGNOSIS — M51369 Other intervertebral disc degeneration, lumbar region without mention of lumbar back pain or lower extremity pain: Secondary | ICD-10-CM | POA: Insufficient documentation

## 2014-07-09 DIAGNOSIS — I714 Abdominal aortic aneurysm, without rupture, unspecified: Secondary | ICD-10-CM | POA: Insufficient documentation

## 2014-07-09 DIAGNOSIS — M5136 Other intervertebral disc degeneration, lumbar region: Secondary | ICD-10-CM | POA: Diagnosis not present

## 2014-07-09 NOTE — Progress Notes (Signed)
Referred by: Tyler Pretty, MD [address] [place], [place] [address]  Reason for referral: AAA  History of Present Illness  The patient is a 69 y.o. (27-Oct-1945) male who presents with chief complaint: enlarging AAA. Previous ultrasound in February 2016 demonstrated an AAA, measuring 4.1 cm  His aneurysm has been followed for the past 4 years. The patient has chronic back and abdominal pain. His PCP is Dr. Shelia Mcmillan, who he recently saw regarding worsening back pain. He had a lumbar spine x-ray which revealed degenerative disc disease and a 5.3 cm aneurysm and was referred here. The patient does not history of embolic episodes from the AAA. He does complain of bilateral leg and foot numbness. He denies any claudication or rest pain symptoms. However he is unable to ambulate far due to his back pain. The patient's risk factors for AAA included: hypertension, smoking.   He has a PMH of bladder neoplasm s/p resection of bladder tumor in 2014. He complains of intermittent chest pressure unassociated with exertion. He has never seen a cardiologist in the past. He has no history of known CAD or CVA. He has no history of diabetes.   Past Medical History  Diagnosis Date  . Hypertension   . Bladder neoplasm   . AAA (abdominal aortic aneurysm) without rupture     [place] ABD.  DONE  JUNE 2014  3.5  . Chronic anxiety   . GERD (gastroesophageal reflux disease)     Past Surgical History  Procedure Laterality Date  . Transurethral resection of bladder tumor with gyrus (turbt-gyrus) N/A 10/04/2012    Procedure: TRANSURETHRAL RESECTION OF BLADDER TUMOR WITH GYRUS (TURBT-GYRUS);  Surgeon: Tyler Frock, MD;  Location: [facility];  Service: Urology;  Laterality: N/A;  . Cystoscopy w/ ureteral stent placement Left 10/04/2012    Procedure: CYSTOSCOPY WITH RETROGRADE PYELOGRAM/URETERAL STENT PLACEMENT   "POSSIBLE LEFT STENT";  Surgeon: Tyler Frock, MD;  Location: [facility];  Service: Urology;  Laterality: Left;  [name] Meatotomy N/A 10/04/2012    Procedure: MEATOTOMY ADULT;  Surgeon: Tyler Frock, MD;  Location: [facility];  Service: Urology;  Laterality: N/A;    History   Social History  . Marital Status: Married    Spouse Name: N/A  . Number of Children: N/A  . Years of Education: N/A   Occupational History  . Not on file.   Social History Main Topics  . Smoking status: Current Every Day Smoker -- 0.50 packs/day for 50 years    Types: Cigarettes  . Smokeless tobacco: Not on file  . Alcohol Use: 14.4 oz/week    10 Glasses of wine, 14 Cans of beer per week  . Drug Use: No  . Sexual Activity: Not on file   Other Topics Concern  . Not on file   Social History Narrative    Family History  Problem Relation Age of Onset  . Cancer Mother   . Heart disease Father     before age 31  . Heart attack Father   . Cancer Brother     Current Outpatient Prescriptions on File Prior to Visit  Medication Sig Dispense Refill  . atenolol (TENORMIN) 50 MG tablet Take 50 mg by mouth every morning.    [name] albuterol (PROVENTIL HFA;VENTOLIN HFA) 108 (90 BASE) MCG/ACT inhaler Inhale 2 puffs into the lungs every 6 (six) hours as needed for wheezing or shortness of breath. (Patient not taking: Reported on 07/09/2014) 1 Inhaler  0  . azithromycin (ZITHROMAX) 250 MG tablet Take 2 pills by mouth on day 1, then 1 pill by mouth per day on days 2 through 5. (Patient not taking: Reported on 07/09/2014) 6 each 0  . clonazePAM (KLONOPIN) 2 MG tablet Take 0.5 mg by mouth daily as needed for anxiety.     [name] omeprazole (PRILOSEC) 20 MG capsule Take 20 mg by mouth as needed.     [name] oxybutynin (DITROPAN) 5 MG tablet Take 1 tablet (5 mg total) by mouth every 8 (eight) hours as needed. For bladder spasms / stent discomfort (Patient not taking: Reported on 07/09/2014) 30 tablet 1  . predniSONE (DELTASONE) 20 MG tablet Take 2 tablets (40 mg total) by mouth daily  with breakfast. (Patient not taking: Reported on 07/09/2014) 10 tablet 0  . senna-docusate (SENOKOT-S) 8.6-50 MG per tablet Take 1 tablet by mouth 2 (two) times daily. While taking pain medications to prevent constipation (Patient not taking: Reported on 07/09/2014) 30 tablet 1  . sulfamethoxazole-trimethoprim (BACTRIM DS) 800-160 MG per tablet Take 1 tablet by mouth daily. X 3 days. Start day prior to next Urology appointment (Patient not taking: Reported on 07/09/2014) 3 tablet 0  . traMADol (ULTRAM) 50 MG tablet Take 1-2 tablets (50-100 mg total) by mouth every 6 (six) hours as needed for pain. (Patient not taking: Reported on 07/09/2014) 30 tablet 2   No current facility-administered medications on file prior to visit.    No Known Allergies   REVIEW OF SYSTEMS:  (Positives checked otherwise negative)  CARDIOVASCULAR:  '[]'$  chest pain, '[x]'$  chest pressure, '[]'$  palpitations, '[]'$  shortness of breath when laying flat, '[]'$  shortness of breath with exertion,  '[x]'$  pain in feet when walking, '[x]'$  pain in feet when laying flat, '[]'$  history of blood clot in veins (DVT), '[]'$  history of phlebitis, '[]'$  swelling in legs, '[]'$  varicose veins  PULMONARY:  '[x]'$  productive cough, '[]'$  asthma, '[]'$  wheezing  NEUROLOGIC:  '[x]'$  weakness in arms or legs, '[x]'$  numbness in arms or legs, '[]'$  difficulty speaking or slurred speech, '[]'$  temporary loss of vision in one eye, '[]'$  dizziness  HEMATOLOGIC:  '[]'$  bleeding problems, '[]'$  problems with blood clotting too easily  MUSCULOSKEL:  '[]'$  joint pain, '[]'$  joint swelling  GASTROINTEST:  '[]'$  vomiting blood, '[]'$  blood in stool     GENITOURINARY:  '[]'$  burning with urination, '[]'$  blood in urine  PSYCHIATRIC:  '[]'$  history of major depression  INTEGUMENTARY:  '[]'$  rashes, '[]'$  ulcers  CONSTITUTIONAL:  '[]'$  fever, '[]'$  chills  For VQI Use Only  PRE-ADM LIVING: Home  AMB STATUS: Ambulatory  CAD Sx: Intermittent chest pressure unassociated with exertion.  PRIOR CHF: None   STRESS TEST: '[ ]'$  No, '[ ]'$   Normal, '[ ]'$  + ischemia, '[ ]'$  + MI, '[ ]'$  Both    Physical Examination  Filed Vitals:   07/09/14 1320  BP: 123/85  Pulse: 80  Height: '5\' 10"'$  (1.778 m)  Weight: 200 lb (90.719 kg)  SpO2: 96%   Body mass index is 28.7 kg/(m^2).  General: A&O x 3, WDWN male in NAD  Head: [place]/AT  Neck: Supple, no nuchal rigidity  Pulmonary: Sym exp, good air movt, CTAB, no rales, rhonchi, & wheezing  Cardiac: RRR, Nl S1, S2, no Murmurs, rubs or gallops  Vascular: Vessel Right Left  Radial Palpable Palpable  Brachial Palpable Palpable  Carotid Palpable, without bruit Palpable, without bruit  Aorta Not palpable N/A  Femoral Palpable Palpable  Popliteal Not palpable Not palpable  PT Not  palpable Not palpable  DP Palpable Palpable   Gastrointestinal: soft, NTND, -G/R, - HSM, - masses  Musculoskeletal: M/S 5/5 throughout, Extremities without ischemic changes.  Neurologic: CN 2-12 grossly intact, Motor exam as listed above  Psychiatric: Judgment intact, Mood & affect appropriate for pt's clinical situation  Dermatologic: See M/S exam for extremity exam, no rashes otherwise noted  Outside Studies/Documentation 10 pages of outside documents were reviewed including: report from lumbar plain film, medical records from Dr. Shelia Mcmillan.  Medical Decision Making  The patient is a 69 y.o. male who presents with: AAA without rupture. His last AAA ultrasound was in February 2016 which revealed a 4.1 cm AAA. His recent lumbar spine plain film is not an adequate modality for assessment of abdominal aortic aneurysms. His finding of 5.3 cm on recent lumbar x-ray is very likely an overestimation. The threshold for repair is AAA size > 5.5 cm, growth > 1 cm/yr, and symptomatic status. Explained that his bilateral leg paresthesias are likely of spinal etiology. He will need an abdominal ultrasound 6 months from his previous ultrasound in February 2016. He has chosen to have this followed at Dr. Pennie Banter office. He will  follow-up here on an as-needed basis.   Virgina Jock, PA-C [facility] Office: [phone] Pager: [phone]  07/09/2014, 2:10 PM   This patient was seen in conjunction with Dr. Donnetta Hutching  I have examined the patient, reviewed and agree with above. Discussed the significance of his small to moderate infrarenal abdominal aortic aneurysm. Explained the typical overestimation of plain film. Would recommend repeat ultrasound 6 months from his last one which will be in August to September 2016. Assuming this shows no change would recommend yearly duplex. He prefers this be taken care of through Dr. Pennie Banter office. We will be available as needed  Curt Jews, MD 07/09/2014 2:39 PM

## 2014-07-16 ENCOUNTER — Encounter: Payer: Self-pay | Admitting: Internal Medicine

## 2014-07-19 ENCOUNTER — Other Ambulatory Visit: Payer: Self-pay | Admitting: Internal Medicine

## 2014-07-19 DIAGNOSIS — R1084 Generalized abdominal pain: Secondary | ICD-10-CM

## 2014-08-02 ENCOUNTER — Other Ambulatory Visit: Payer: Medicare Other

## 2015-03-19 ENCOUNTER — Other Ambulatory Visit: Payer: Medicare Other

## 2015-06-30 DIAGNOSIS — F419 Anxiety disorder, unspecified: Secondary | ICD-10-CM | POA: Diagnosis not present

## 2015-06-30 DIAGNOSIS — I10 Essential (primary) hypertension: Secondary | ICD-10-CM | POA: Diagnosis not present

## 2015-06-30 DIAGNOSIS — M545 Low back pain: Secondary | ICD-10-CM | POA: Diagnosis not present

## 2015-06-30 DIAGNOSIS — F17210 Nicotine dependence, cigarettes, uncomplicated: Secondary | ICD-10-CM | POA: Diagnosis not present

## 2016-03-08 DIAGNOSIS — Z0000 Encounter for general adult medical examination without abnormal findings: Secondary | ICD-10-CM | POA: Diagnosis not present

## 2016-03-08 DIAGNOSIS — K635 Polyp of colon: Secondary | ICD-10-CM | POA: Diagnosis not present

## 2016-03-08 DIAGNOSIS — I714 Abdominal aortic aneurysm, without rupture: Secondary | ICD-10-CM | POA: Diagnosis not present

## 2016-03-08 DIAGNOSIS — I10 Essential (primary) hypertension: Secondary | ICD-10-CM | POA: Diagnosis not present

## 2016-03-08 DIAGNOSIS — Z125 Encounter for screening for malignant neoplasm of prostate: Secondary | ICD-10-CM | POA: Diagnosis not present

## 2016-03-16 DIAGNOSIS — L719 Rosacea, unspecified: Secondary | ICD-10-CM | POA: Diagnosis not present

## 2016-03-16 DIAGNOSIS — K8020 Calculus of gallbladder without cholecystitis without obstruction: Secondary | ICD-10-CM | POA: Diagnosis not present

## 2016-03-16 DIAGNOSIS — K219 Gastro-esophageal reflux disease without esophagitis: Secondary | ICD-10-CM | POA: Diagnosis not present

## 2016-03-16 DIAGNOSIS — Z0001 Encounter for general adult medical examination with abnormal findings: Secondary | ICD-10-CM | POA: Diagnosis not present

## 2016-03-16 DIAGNOSIS — I10 Essential (primary) hypertension: Secondary | ICD-10-CM | POA: Diagnosis not present

## 2016-03-16 DIAGNOSIS — Z1212 Encounter for screening for malignant neoplasm of rectum: Secondary | ICD-10-CM | POA: Diagnosis not present

## 2016-03-17 ENCOUNTER — Other Ambulatory Visit: Payer: Self-pay | Admitting: Internal Medicine

## 2016-03-17 DIAGNOSIS — K824 Cholesterolosis of gallbladder: Secondary | ICD-10-CM

## 2016-07-02 ENCOUNTER — Telehealth: Payer: Self-pay | Admitting: Internal Medicine

## 2016-07-02 ENCOUNTER — Emergency Department (HOSPITAL_COMMUNITY): Payer: Medicare Other

## 2016-07-02 ENCOUNTER — Emergency Department (HOSPITAL_COMMUNITY)
Admit: 2016-07-02 | Discharge: 2016-07-02 | Disposition: A | Discharge status: Home / Self Care | Payer: Medicare Other | Attending: Emergency Medicine | Admitting: Emergency Medicine

## 2016-07-02 ENCOUNTER — Inpatient Hospital Stay (HOSPITAL_COMMUNITY)
Admission: EM | Admit: 2016-07-02 | Discharge: 2016-07-03 | DRG: 206 | Disposition: A | Discharge status: Home / Self Care | Payer: Medicare Other | Attending: Internal Medicine | Admitting: Internal Medicine

## 2016-07-02 ENCOUNTER — Encounter (HOSPITAL_COMMUNITY)
Admission: EM | Disposition: A | Discharge status: Home / Self Care | Payer: Self-pay | Source: Home / Self Care | Attending: Family Medicine

## 2016-07-02 ENCOUNTER — Encounter (HOSPITAL_COMMUNITY): Payer: Self-pay | Admitting: *Deleted

## 2016-07-02 DIAGNOSIS — M7989 Other specified soft tissue disorders: Secondary | ICD-10-CM

## 2016-07-02 DIAGNOSIS — R079 Chest pain, unspecified: Secondary | ICD-10-CM | POA: Diagnosis present

## 2016-07-02 DIAGNOSIS — Z8551 Personal history of malignant neoplasm of bladder: Secondary | ICD-10-CM

## 2016-07-02 DIAGNOSIS — Z8249 Family history of ischemic heart disease and other diseases of the circulatory system: Secondary | ICD-10-CM

## 2016-07-02 DIAGNOSIS — R0781 Pleurodynia: Secondary | ICD-10-CM | POA: Diagnosis not present

## 2016-07-02 DIAGNOSIS — I25110 Atherosclerotic heart disease of native coronary artery with unstable angina pectoris: Secondary | ICD-10-CM | POA: Diagnosis present

## 2016-07-02 DIAGNOSIS — Z6827 Body mass index (BMI) 27.0-27.9, adult: Secondary | ICD-10-CM

## 2016-07-02 DIAGNOSIS — R071 Chest pain on breathing: Secondary | ICD-10-CM

## 2016-07-02 DIAGNOSIS — I200 Unstable angina: Secondary | ICD-10-CM

## 2016-07-02 DIAGNOSIS — K219 Gastro-esophageal reflux disease without esophagitis: Secondary | ICD-10-CM | POA: Diagnosis present

## 2016-07-02 DIAGNOSIS — F418 Other specified anxiety disorders: Secondary | ICD-10-CM | POA: Diagnosis not present

## 2016-07-02 DIAGNOSIS — I714 Abdominal aortic aneurysm, without rupture, unspecified: Secondary | ICD-10-CM | POA: Diagnosis present

## 2016-07-02 DIAGNOSIS — I2584 Coronary atherosclerosis due to calcified coronary lesion: Secondary | ICD-10-CM | POA: Diagnosis not present

## 2016-07-02 DIAGNOSIS — R911 Solitary pulmonary nodule: Principal | ICD-10-CM | POA: Diagnosis present

## 2016-07-02 DIAGNOSIS — I119 Hypertensive heart disease without heart failure: Secondary | ICD-10-CM | POA: Diagnosis not present

## 2016-07-02 DIAGNOSIS — E669 Obesity, unspecified: Secondary | ICD-10-CM | POA: Diagnosis present

## 2016-07-02 DIAGNOSIS — Z809 Family history of malignant neoplasm, unspecified: Secondary | ICD-10-CM | POA: Diagnosis not present

## 2016-07-02 DIAGNOSIS — F17210 Nicotine dependence, cigarettes, uncomplicated: Secondary | ICD-10-CM | POA: Diagnosis not present

## 2016-07-02 DIAGNOSIS — J984 Other disorders of lung: Secondary | ICD-10-CM | POA: Diagnosis not present

## 2016-07-02 HISTORY — PX: LEFT HEART CATH AND CORONARY ANGIOGRAPHY: CATH118249

## 2016-07-02 LAB — BASIC METABOLIC PANEL
Anion gap: 9 (ref 5–15)
BUN: 8 mg/dL (ref 6–20)
CO2: 24 mmol/L (ref 22–32)
Calcium: 8.7 mg/dL — ABNORMAL LOW (ref 8.9–10.3)
Chloride: 97 mmol/L — ABNORMAL LOW (ref 101–111)
Creatinine, Ser: 0.88 mg/dL (ref 0.61–1.24)
GFR calc non Af Amer: >60 mL/min (ref >60)
GLUCOSE: 160 mg/dL — AB (ref 65–99)
Potassium: 4.4 mmol/L (ref 3.5–5.1)
Sodium: 130 mmol/L — ABNORMAL LOW (ref 135–145)

## 2016-07-02 LAB — CREATININE, SERUM
CREATININE: 0.71 mg/dL (ref 0.61–1.24)
GFR calc Af Amer: >60 mL/min (ref >60)

## 2016-07-02 LAB — CBC
HCT: 47.5 % (ref 39.0–52.0)
HCT: 46.7 % (ref 39.0–52.0)
HEMOGLOBIN: 15.7 g/dL (ref 13.0–17.0)
Hemoglobin: 16.2 g/dL (ref 13.0–17.0)
MCH: 35.5 pg — AB (ref 26.0–34.0)
MCH: 35.2 pg — ABNORMAL HIGH (ref 26.0–34.0)
MCHC: 34.1 g/dL (ref 30.0–36.0)
MCHC: 33.6 g/dL (ref 30.0–36.0)
MCV: 104.2 fL — ABNORMAL HIGH (ref 78.0–100.0)
MCV: 104.7 fL — ABNORMAL HIGH (ref 78.0–100.0)
PLATELETS: 201 10*3/uL (ref 150–400)
Platelets: 200 10*3/uL (ref 150–400)
RBC: 4.56 MIL/uL (ref 4.22–5.81)
RBC: 4.46 MIL/uL (ref 4.22–5.81)
RDW: 13.3 % (ref 11.5–15.5)
RDW: 13.3 % (ref 11.5–15.5)
WBC: 10.5 10*3/uL (ref 4.0–10.5)
WBC: 8.7 10*3/uL (ref 4.0–10.5)

## 2016-07-02 LAB — TROPONIN I

## 2016-07-02 LAB — I-STAT TROPONIN, ED
TROPONIN I, POC: 0 ng/mL (ref 0.00–0.08)
Troponin i, poc: 0 ng/mL (ref 0.00–0.08)

## 2016-07-02 SURGERY — LEFT HEART CATH AND CORONARY ANGIOGRAPHY
Anesthesia: LOCAL

## 2016-07-02 MED ORDER — HEPARIN (PORCINE) IN NACL 2-0.9 UNIT/ML-% IJ SOLN
INTRAMUSCULAR | Status: AC
  Filled 2016-07-02: qty 1000

## 2016-07-02 MED ORDER — HEPARIN SODIUM (PORCINE) 5000 UNIT/ML IJ SOLN
5000.0000 [IU] | Freq: Three times a day (TID) | INTRAMUSCULAR | Status: DC
  Administered 2016-07-03: 5000 [IU] via SUBCUTANEOUS
  Filled 2016-07-02: qty 1

## 2016-07-02 MED ORDER — NITROGLYCERIN 0.4 MG SL SUBL
0.4000 mg | SUBLINGUAL_TABLET | SUBLINGUAL | Status: DC | PRN
Start: 2016-07-02 — End: 2016-07-03
  Administered 2016-07-02 (×2): 0.4 mg via SUBLINGUAL
  Filled 2016-07-02: qty 1

## 2016-07-02 MED ORDER — CLONAZEPAM 0.5 MG PO TABS
0.5000 mg | ORAL_TABLET | Freq: Two times a day (BID) | ORAL | Status: DC | PRN
  Administered 2016-07-02 – 2016-07-03 (×2): 0.5 mg via ORAL
  Filled 2016-07-02 (×2): qty 1

## 2016-07-02 MED ORDER — SODIUM CHLORIDE 0.9% FLUSH: 3.0000 mL | INTRAVENOUS | Status: DC | PRN

## 2016-07-02 MED ORDER — PANTOPRAZOLE SODIUM 40 MG PO TBEC
40.0000 mg | DELAYED_RELEASE_TABLET | Freq: Every day | ORAL | Status: DC
  Filled 2016-07-02: qty 1

## 2016-07-02 MED ORDER — ATENOLOL 50 MG PO TABS
50.0000 mg | ORAL_TABLET | Freq: Every morning | ORAL | Status: DC
  Administered 2016-07-03: 50 mg via ORAL
  Filled 2016-07-02: qty 1

## 2016-07-02 MED ORDER — SODIUM CHLORIDE 0.9% FLUSH: 3.0000 mL | Freq: Two times a day (BID) | INTRAVENOUS | Status: DC

## 2016-07-02 MED ORDER — ONDANSETRON HCL 4 MG/2ML IJ SOLN: 4.0000 mg | Freq: Four times a day (QID) | INTRAMUSCULAR | Status: DC | PRN

## 2016-07-02 MED ORDER — SODIUM CHLORIDE 0.9 % IV SOLN: 250.0000 mL | INTRAVENOUS | Status: DC | PRN

## 2016-07-02 MED ORDER — LORAZEPAM 2 MG/ML IJ SOLN
1.0000 mg | Freq: Once | INTRAMUSCULAR | Status: AC
  Administered 2016-07-02: 1 mg via INTRAVENOUS
  Filled 2016-07-02: qty 1

## 2016-07-02 MED ORDER — ASPIRIN EC 325 MG PO TBEC
325.0000 mg | DELAYED_RELEASE_TABLET | Freq: Every day | ORAL | Status: DC
Start: 2016-07-03 — End: 2016-07-03
  Administered 2016-07-03: 325 mg via ORAL
  Filled 2016-07-02: qty 1

## 2016-07-02 MED ORDER — FENTANYL CITRATE (PF) 100 MCG/2ML IJ SOLN
INTRAMUSCULAR | Status: DC | PRN
  Administered 2016-07-02: 25 ug via INTRAVENOUS

## 2016-07-02 MED ORDER — FENTANYL CITRATE (PF) 100 MCG/2ML IJ SOLN
INTRAMUSCULAR | Status: AC
  Filled 2016-07-02: qty 2

## 2016-07-02 MED ORDER — VERAPAMIL HCL 2.5 MG/ML IV SOLN
INTRAVENOUS | Status: DC | PRN
  Administered 2016-07-02: 10 mL via INTRA_ARTERIAL

## 2016-07-02 MED ORDER — MIDAZOLAM HCL 2 MG/2ML IJ SOLN
INTRAMUSCULAR | Status: DC | PRN
  Administered 2016-07-02: 2 mg via INTRAVENOUS

## 2016-07-02 MED ORDER — KCL IN DEXTROSE-NACL 20-5-0.45 MEQ/L-%-% IV SOLN
INTRAVENOUS | Status: DC
  Administered 2016-07-03: 07:00:00 via INTRAVENOUS
  Filled 2016-07-02 (×2): qty 1000

## 2016-07-02 MED ORDER — SODIUM CHLORIDE 0.9 % WEIGHT BASED INFUSION
1.0000 mL/kg/h | INTRAVENOUS | Status: AC
  Administered 2016-07-02: 1 mL/kg/h via INTRAVENOUS

## 2016-07-02 MED ORDER — LIDOCAINE HCL (PF) 1 % IJ SOLN
INTRAMUSCULAR | Status: DC | PRN
Start: 2016-07-02 — End: 2016-07-02
  Administered 2016-07-02: 2 mL via SUBCUTANEOUS

## 2016-07-02 MED ORDER — IOPAMIDOL (ISOVUE-370) INJECTION 76%
INTRAVENOUS | Status: AC
  Administered 2016-07-02: 100 mL
  Filled 2016-07-02: qty 100

## 2016-07-02 MED ORDER — HEPARIN SODIUM (PORCINE) 5000 UNIT/ML IJ SOLN: 5000.0000 [IU] | Freq: Three times a day (TID) | INTRAMUSCULAR | Status: DC

## 2016-07-02 MED ORDER — HEPARIN SODIUM (PORCINE) 1000 UNIT/ML IJ SOLN
INTRAMUSCULAR | Status: DC | PRN
  Administered 2016-07-02: 4500 [IU] via INTRAVENOUS

## 2016-07-02 MED ORDER — IOPAMIDOL (ISOVUE-370) INJECTION 76%
INTRAVENOUS | Status: DC | PRN
  Administered 2016-07-02: 70 mL via INTRA_ARTERIAL

## 2016-07-02 MED ORDER — ASPIRIN 81 MG PO CHEW
324.0000 mg | CHEWABLE_TABLET | Freq: Once | ORAL | Status: AC
  Administered 2016-07-02: 324 mg via ORAL
  Filled 2016-07-02: qty 4

## 2016-07-02 MED ORDER — MIDAZOLAM HCL 2 MG/2ML IJ SOLN
INTRAMUSCULAR | Status: AC
  Filled 2016-07-02: qty 2

## 2016-07-02 MED ORDER — LIDOCAINE HCL 1 % IJ SOLN
INTRAMUSCULAR | Status: AC
  Filled 2016-07-02: qty 20

## 2016-07-02 MED ORDER — MORPHINE SULFATE (PF) 4 MG/ML IV SOLN: 2.0000 mg | INTRAVENOUS | Status: DC | PRN

## 2016-07-02 MED ORDER — HEPARIN SODIUM (PORCINE) 1000 UNIT/ML IJ SOLN
INTRAMUSCULAR | Status: AC
  Filled 2016-07-02: qty 1

## 2016-07-02 MED ORDER — ACETAMINOPHEN 325 MG PO TABS: 650.0000 mg | ORAL_TABLET | ORAL | Status: DC | PRN

## 2016-07-02 MED ORDER — HEPARIN (PORCINE) IN NACL 2-0.9 UNIT/ML-% IJ SOLN
INTRAMUSCULAR | Status: AC | PRN
  Administered 2016-07-02: 1000 mL

## 2016-07-02 MED ORDER — IOPAMIDOL (ISOVUE-370) INJECTION 76%
INTRAVENOUS | Status: AC
  Filled 2016-07-02: qty 100

## 2016-07-02 MED ORDER — VERAPAMIL HCL 2.5 MG/ML IV SOLN
INTRAVENOUS | Status: AC
  Filled 2016-07-02: qty 2

## 2016-07-02 SURGICAL SUPPLY — 10 items
CATH IMPULSE 5F ANG/FL3.5 (CATHETERS) ×2 IMPLANT
DEVICE RAD COMP TR BAND LRG (VASCULAR PRODUCTS) ×2 IMPLANT
GLIDESHEATH SLEND SS 6F .021 (SHEATH) ×2 IMPLANT
GUIDEWIRE INQWIRE 1.5J.035X260 (WIRE) ×1 IMPLANT
INQWIRE 1.5J .035X260CM (WIRE) ×2
KIT HEART LEFT (KITS) ×2 IMPLANT
PACK CARDIAC CATHETERIZATION (CUSTOM PROCEDURE TRAY) ×2 IMPLANT
SYR MEDRAD MARK V 150ML (SYRINGE) ×2 IMPLANT
TRANSDUCER W/STOPCOCK (MISCELLANEOUS) ×2 IMPLANT
TUBING CIL FLEX 10 FLL-RA (TUBING) ×2 IMPLANT

## 2016-07-02 NOTE — H&P (Signed)
[facility] History and Physical  Daniyal Tabor LZJ:[id number] DOB: 09-02-1945 DOA: 07/02/2016  Referring physician:  PCP: [name], MD   Chief Complaint: CP  HPI: Tyler Mcmillan is a 71 y.o. male  with past medical history of anxiety, bladder cancer, reflux and hypertension presented with a chief complaint of chest discomfort. No chest pain started yesterday it went from the mid chest to the left shoulder. Patient's pain was continuous. It worsened throughout the night. Patient describes the pain as a stabbing that is sharp. In the morning patient may switch emergency room for evaluation.  Denies history of heart attack. No family history heart attack. Denies TB exposure.    Review of Systems:  As per HPI otherwise 10 point review of systems negative.    Past Medical History:  Diagnosis Date  . AAA (abdominal aortic aneurysm) without rupture ([place])    [place] ABD.  DONE  JUNE 2014  3.5  . Bladder neoplasm   . Chronic anxiety   . GERD (gastroesophageal reflux disease)   . Hypertension    Past Surgical History:  Procedure Laterality Date  . CYSTOSCOPY W/ URETERAL STENT PLACEMENT Left 10/04/2012   Procedure: CYSTOSCOPY WITH RETROGRADE PYELOGRAM/URETERAL STENT PLACEMENT   "POSSIBLE LEFT STENT";  Surgeon: Alexis Frock, MD;  Location: [facility];  Service: Urology;  Laterality: Left;  [name] MEATOTOMY N/A 10/04/2012   Procedure: MEATOTOMY ADULT;  Surgeon: Alexis Frock, MD;  Location: [facility];  Service: Urology;  Laterality: N/A;  . TRANSURETHRAL RESECTION OF BLADDER TUMOR WITH GYRUS (TURBT-GYRUS) N/A 10/04/2012   Procedure: TRANSURETHRAL RESECTION OF BLADDER TUMOR WITH GYRUS (TURBT-GYRUS);  Surgeon: Alexis Frock, MD;  Location[facility];  Service: Urology;  Laterality: N/A;   Social History:  reports that he has been smoking Cigarettes.  He has a 25.00 pack-year smoking history. He has never used smokeless tobacco. He reports  that he drinks about 14.4 oz of alcohol per week . He reports that he does not use drugs.  No Known Allergies  Family History  Problem Relation Age of Onset  . Cancer Mother   . Heart disease Father        before age 22  . Heart attack Father   . Cancer Brother      Prior to Admission medications   Medication Sig Start Date End Date Taking? Authorizing Provider  atenolol (TENORMIN) 50 MG tablet Take 50 mg by mouth every morning.   Yes [provider]  clonazePAM (KLONOPIN) 2 MG tablet Take 0.5 mg by mouth daily as needed for anxiety.    Yes [provider]  albuterol (PROVENTIL HFA;VENTOLIN HFA) 108 (90 BASE) MCG/ACT inhaler Inhale 2 puffs into the lungs every 6 (six) hours as needed for wheezing or shortness of breath. Patient not taking: Reported on 07/09/2014 06/17/13   Wendie Agreste, MD  omeprazole (PRILOSEC) 20 MG capsule Take 20 mg by mouth as needed.     [provider]  oxybutynin (DITROPAN) 5 MG tablet Take 1 tablet (5 mg total) by mouth every 8 (eight) hours as needed. For bladder spasms / stent discomfort Patient not taking: Reported on 07/09/2014 10/04/12   Alexis Frock, MD  senna-docusate (SENOKOT-S) 8.6-50 MG per tablet Take 1 tablet by mouth 2 (two) times daily. While taking pain medications to prevent constipation Patient not taking: Reported on 07/09/2014 10/04/12   Alexis Frock, MD  traMADol (ULTRAM) 50 MG tablet Take 1-2 tablets (50-100 mg total) by mouth every 6 (  six) hours as needed for pain. Patient not taking: Reported on 07/09/2014 10/04/12   Alexis Frock, MD   Physical Exam: Vitals:   07/02/16 1706 07/02/16 1711 07/02/16 1716 07/02/16 1721  BP: (!) 132/93 139/81 136/84 (!) 149/83  Pulse: 82 79 80 83  Resp: (!) 33 20 14 20   Temp:      TempSrc:      SpO2: 97% 95% 97% 97%  Weight:      Height:        Wt Readings from Last 3 Encounters:  07/02/16 86.2 kg (190 lb)  07/09/14 90.7 kg (200 lb)  06/17/13 86.2 kg (190 lb)     General:  Appears calm and comfortable; A&Ox3 Eyes:  PERRL, EOMI, normal lids, iris ENT:  grossly normal hearing, lips & tongue Neck:  no LAD, masses or thyromegaly Cardiovascular:  RRR, no m/r/g. No LE edema.  Respiratory:  CTA bilaterally, no w/r/r. Normal respiratory effort. Abdomen:  soft, ntnd Skin:  no rash or induration seen on limited exam Musculoskeletal:  grossly normal tone BUE/BLE Psychiatric:  grossly normal mood and affect, speech fluent and appropriate Neurologic:  CN 2-12 grossly intact, moves all extremities in coordinated fashion.          Labs on Admission:  Basic Metabolic Panel:  Recent Labs Lab 07/02/16 0958  NA 130*  K 4.4  CL 97*  CO2 24  GLUCOSE 160*  BUN 8  CREATININE 0.88  CALCIUM 8.7*   Liver Function Tests: No results for input(s): AST, ALT, ALKPHOS, BILITOT, PROT, ALBUMIN in the last 168 hours. No results for input(s): LIPASE, AMYLASE in the last 168 hours. No results for input(s): AMMONIA in the last 168 hours. CBC:  Recent Labs Lab 07/02/16 0958  WBC 10.5  HGB 16.2  HCT 47.5  MCV 104.2*  PLT 200   Cardiac Enzymes: No results for input(s): CKTOTAL, CKMB, CKMBINDEX, TROPONINI in the last 168 hours.  BNP (last 3 results) No results for input(s): BNP in the last 8760 hours.  ProBNP (last 3 results) No results for input(s): PROBNP in the last 8760 hours.   Serum creatinine: 0.88 mg/dL 07/02/16 0958 Estimated creatinine clearance: 79.5 mL/min  CBG: No results for input(s): GLUCAP in the last 168 hours.  Radiological Exams on Admission: Dg Chest 2 View  Result Date: 07/02/2016 CLINICAL DATA:  Chest pain. EXAM: CHEST  2 VIEW COMPARISON:  Radiographs of Jun 17, 2013. FINDINGS: The heart size and mediastinal contours are within normal limits. Atherosclerosis of thoracic aorta is noted. No pneumothorax or pleural effusion is noted. Interval development of ill-defined opacity in left upper lobe concerning for possible pneumonia.  Stable bibasilar scarring is noted. The visualized skeletal structures are unremarkable. IMPRESSION: Aortic atherosclerosis. Ill-defined left upper lobe opacity is noted concerning for possible pneumonia. Followup PA and lateral chest X-ray is recommended in 3-4 weeks following trial of antibiotic therapy to ensure resolution and exclude underlying malignancy. Electronically Signed   By: Marijo Conception, M.D.   On: 07/02/2016 10:41   Ct Angio Chest/abd/pel For Dissection W And/or Wo Contrast  Result Date: 07/02/2016 CLINICAL DATA:  Chest pain. Pain between shoulder blades. Lower extremity swelling. Sternal fracture. EXAM: CT ANGIOGRAPHY CHEST, ABDOMEN AND PELVIS TECHNIQUE: Multidetector CT imaging through the chest, abdomen and pelvis was performed using the standard protocol during bolus administration of intravenous contrast. Multiplanar reconstructed images and MIPs were obtained and reviewed to evaluate the vascular anatomy. CONTRAST:  100 mL Isovue 370 COMPARISON:  None. FINDINGS: CTA  CHEST FINDINGS Cardiovascular: Noncontrast imaging demonstrates no intramural hematoma within the aorta. Contrast enhanced imaging demonstrates no dissection of the ascending, transverse or descending thoracic aorta. No aneurysm. Great vessels normal. Coronary artery calcification and aortic atherosclerotic calcification. No evidence of pulmonary embolism. Mediastinum/Nodes: No axillary or supraclavicular adenopathy. No mediastinal hilar adenopathy. No pericardial fluid. Esophagus normal. Lungs/Pleura: Cavitary lesion in the LEFT upper lobe with thickened walls measures 3.9 x 2.5 cm. Wall thickness of 7 mm. No additional pulmonary nodules. Musculoskeletal: No aggressive osseous lesion. Review of the MIP images confirms the above findings. CTA ABDOMEN AND PELVIS FINDINGS VASCULAR Aorta: No evidence of dissection of the abdominal aorta. There is aneurysmal dilatation of the infrarenal abdominal aorta to 56 x 59 mm in axial  dimension. On sagittal imaging maximum dimension of the saccular aneurysm is 56 mm. The aneurysm has increased in size from 38 mm on CT 08/31/2012. Iliac arteries are normal caliber with intimal calcification. IMA is patent. Celiac trunk and SMA are patent. Celiac: Patent SMA: Patent Renals: Single and patent.  Ostial calcification IMA: Patent Inflow: Normal Veins: Normal Review of the MIP images confirms the above findings. NON-VASCULAR Hepatobiliary: No focal hepatic lesion.  Small gallstone. Pancreas: Pancreas is normal. No ductal dilatation. No pancreatic inflammation. Spleen: Normal spleen Adrenals/Urinary Tract: Adrenal glands and kidneys are normal. Ureters and bladder normal. Portion of the bladder wall does enter a RIGHT inguinal hernia. Stomach/Bowel: Stomach, small bowel, appendix, and cecum are normal. The colon and rectosigmoid colon are normal. Lymphatic: No adenopathy Reproductive: Prostate normal. Other: Bilateral fat filled inguinal hernias. Musculoskeletal: No aggressive osseous lesion. Review of the MIP images confirms the above findings. IMPRESSION: Chest Impression: 1. No evidence of thoracic aortic aneurysm or dissection. 2. Cavitary lesion in the LEFT upper lobe with thickened walls. Differential includes a pulmonary infection (consider fungal or mycobacterium infection) versus bronchogenic carcinoma (e.g. squamous cell carcinoma). Recommend bronchoscopy for further evaluation with sampling / microbiology. 3. No pulmonary embolism. Abdomen / Pelvis Impression: 1. No aortic dissection. 2. Infrarenal saccular abdominal aortic aneurysm measuring up to 59 mm is increased in size from 2014. No evidence of acute inflammation or hemorrhage. Vascular surgery consultation recommended due to increased risk of rupture for AAA >5.5 cm. This recommendation follows ACR consensus guidelines: White Paper of the ACR Incidental Findings Committee II on Vascular Findings. J Am Coll Radiol 2013; 10:789-794.  Electronically Signed   By: Suzy Bouchard M.D.   On: 07/02/2016 11:48    EKG: Independently reviewed. No STEMI  Assessment/Plan Active Problems:   Chest pain  1) CP Given hear score of 5 in ED. - serial trop ordered, initial neg - prn EKG CP - prn moprhine CP - prn ntg cp - asa in ED and QD - cath by cardio today 5/25 - tele bed, cardiac monitoring - zofran prn for nausea Cardio consulted by EDP  Cavitary lung lesion Resp precautions Pulm consulted by EDP Per pulm OP f/u with [facility] dept Quantiferon cold pending  AAA Vasc surg consulted by EDP Pt will need OP f/u for enlarging AAA  Code Status: FULL  DVT Prophylaxis: heparin Family Communication: wife at bedside Disposition Plan: Pending Improvement  Status: tele, obs  Dispo: when cardio states ready d/c home  Elwin Mocha, MD Family Medicine [facility] [email or web address] Password [email or web address]

## 2016-07-02 NOTE — ED Notes (Signed)
Pt ambulated to restroom located beside room, tolerated well.

## 2016-07-02 NOTE — ED Notes (Signed)
Attempted to call report

## 2016-07-02 NOTE — ED Triage Notes (Signed)
Pt states pain between shoulder blades and sternal pressure since last night.  States some sob and cough.  States some nausea and foot swelling.

## 2016-07-02 NOTE — ED Notes (Signed)
Patient currently in xray ?

## 2016-07-02 NOTE — ED Notes (Signed)
Due to pt's chief complaint, pt stated he had to use the restroom. I told pt I would get a bedside toilet. Pt state, "I have to go to the bathroom for number two and I will not go in that." Pt ambulated to restroom located beside room, tolerated well.

## 2016-07-02 NOTE — ED Notes (Signed)
Pt going to cath lab; 3W RN made aware.

## 2016-07-02 NOTE — Interval H&P Note (Signed)
History and Physical Interval Note:  07/02/2016 4:42 PM  Tyler Mcmillan  has presented today for surgery, with the diagnosis of unstable angina  The various methods of treatment have been discussed with the patient and family. After consideration of risks, benefits and other options for treatment, the patient has consented to  Procedure(s): Left Heart Cath and Coronary Angiography (N/A) as a surgical intervention .  The patient's history has been reviewed, patient examined, no change in status, stable for surgery.  I have reviewed the patient's chart and labs.  Questions were answered to the patient's satisfaction.    Cath Lab Visit (complete for each Cath Lab visit)  Clinical Evaluation Leading to the Procedure:   ACS: Yes.    Non-ACS:    Anginal Classification: CCS IV  Anti-ischemic medical therapy: No Therapy  Non-Invasive Test Results: No non-invasive testing performed  Prior CABG: No previous CABG       [name] [facility] 07/02/2016 4:42 PM

## 2016-07-02 NOTE — H&P (View-Only) (Signed)
Cardiology Consultation:   Patient ID: Tyler Mcmillan; [id number]; Apr 08, 1945   Admit date: 07/02/2016 Date of Consult: 07/02/2016  Primary Care Provider: [name], MD Primary Cardiologist: New to Dr. Gwenlyn Found    Patient Profile:   Tyler Mcmillan is a 71 y.o. male with a hx of AAA, HTN, GERD and ongoing tobacco abuse who is being seen today for the evaluation of CP at the request of Dr. Jeanell Sparrow  History of Present Illness:   Last documented AAA of 3.5cm  in 07/2012. Prior routine exercise stress test was normal in 2014. Patient smokes half a pack for the past 55 years. Father had a MI at age 71. Younger brother has exposure to agent orange and had lung cancer.  Patient presented with substernal chest pressure radiating to his back. Symptoms started last night that spontaneously resolved after aspirin 324 mg. However, he woke up with similar symptoms at 4 AM that was constant leading to ER presentation. He is chest pain free now after sublingual nitroglycerin x 2. He denies associated nausea, vomiting, shortness of breath or diaphoresis. He has being having intermittent episode of chest tightness with radiation with and without activity for the past couple of months. Recently worsened. Mostly sedentary lifestyle.   EKG shows sinus rhythm at rate of 90 bpm, probably peaked T waves and ST segment abnormality in lead V3 and aVF. Point-of-care troponin negative. Potassium 4.4. MCV 104. Chest x-ray concerning for malignancy versus infective process on the left upper lobe.  CT angiogram did not show any dissection or PE. Coronary artery calcification and aortic atherosclerotic.   Also showed Cavitary lesion in the LEFT upper lobe with thickened walls. Differential includes a pulmonary infection (consider fungal or mycobacterium infection) versus bronchogenic carcinoma (e.g. squamous cell carcinoma). Recommend bronchoscopy for further evaluation with sampling / microbiology.  Calcification.  Infrarenal saccular abdominal aortic aneurysm measuring up to 59 mm is increased in size from 2014. No evidence of acute inflammation or hemorrhage. Vascular surgery consultation recommended due to increased risk of rupture for AAA >5.5 cm.   The patient was consulted by pulmonary. Recommended outpatient bronchoscopy and follow-up with health Department for TB work up.   Past Medical History:  Diagnosis Date  . AAA (abdominal aortic aneurysm) without rupture ([place])    [place] ABD.  DONE  JUNE 2014  3.5  . Bladder neoplasm   . Chronic anxiety   . GERD (gastroesophageal reflux disease)   . Hypertension     Past Surgical History:  Procedure Laterality Date  . CYSTOSCOPY W/ URETERAL STENT PLACEMENT Left 10/04/2012   Procedure: CYSTOSCOPY WITH RETROGRADE PYELOGRAM/URETERAL STENT PLACEMENT   "POSSIBLE LEFT STENT";  Surgeon: Alexis Frock, MD;  Location: [facility];  Service: Urology;  Laterality: Left;  [name] MEATOTOMY N/A 10/04/2012   Procedure: MEATOTOMY ADULT;  Surgeon: Alexis Frock, MD;  Location: [facility];  Service: Urology;  Laterality: N/A;  . TRANSURETHRAL RESECTION OF BLADDER TUMOR WITH GYRUS (TURBT-GYRUS) N/A 10/04/2012   Procedure: TRANSURETHRAL RESECTION OF BLADDER TUMOR WITH GYRUS (TURBT-GYRUS);  Surgeon: Alexis Frock, MD;  Location: [facility];  Service: Urology;  Laterality: N/A;     Inpatient Medications: Scheduled Meds:  Continuous Infusions:  PRN Meds: nitroGLYCERIN  Allergies:   No Known Allergies  Social History:   Social History   Social History  . Marital status: Married    Spouse name: N/A  . Number of children: N/A  . Years of education: N/A   Occupational History  .  Not on file.   Social History Main Topics  . Smoking status: Current Every Day Smoker    Packs/day: 0.50    Years: 50.00    Types: Cigarettes  . Smokeless tobacco: Never Used  . Alcohol use 14.4 oz/week    10 Glasses of  wine, 14 Cans of beer per week     Comment: few glases wine per day  . Drug use: No  . Sexual activity: Not on file   Other Topics Concern  . Not on file   Social History Narrative  . No narrative on file    Family History:   The patient's family history includes Cancer in his brother and mother; Heart attack in his father; Heart disease in his father.  ROS:  Please see the history of present illness.  All other ROS reviewed and negative.     Physical Exam/Data:   Vitals:   07/02/16 1115 07/02/16 1130 07/02/16 1215 07/02/16 1230  BP: 126/62 (!) 142/74 (!) 146/87 (!) 149/82  Pulse: 81 82 80 79  Resp: (!) 24 19 (!) 24 (!) 22  Temp:      TempSrc:      SpO2: 92% 96% 95% 94%  Weight:      Height:       No intake or output data in the 24 hours ending 07/02/16 1359 Filed Weights   07/02/16 0950  Weight: 190 lb (86.2 kg)   Body mass index is 27.26 kg/m.  General:  Well nourished, well developed, in no acute distress HEENT: normal Lymph: no adenopathy Neck: no JVD Endocrine:  No thryomegaly Vascular: No carotid bruits; FA pulses 2+ bilaterally without bruits  Cardiac:  normal S1, S2; RRR; no murmur Lungs:Scattered diffuse wheezing. Abd: soft, nontender, no hepatomegaly  Ext: Tress lower extremity edema r> L Musculoskeletal:  No deformities, BUE and BLE strength normal and equal Skin: warm and dry  Neuro:  CNs 2-12 intact, no focal abnormalities noted Psych:  Normal affect    Laboratory Data:  Chemistry Recent Labs Lab 07/02/16 0958  NA 130*  K 4.4  CL 97*  CO2 24  GLUCOSE 160*  BUN 8  CREATININE 0.88  CALCIUM 8.7*  GFRNONAA >60  GFRAA >60  ANIONGAP 9    No results for input(s): PROT, ALBUMIN, AST, ALT, ALKPHOS, BILITOT in the last 168 hours. Hematology Recent Labs Lab 07/02/16 0958  WBC 10.5  RBC 4.56  HGB 16.2  HCT 47.5  MCV 104.2*  MCH 35.5*  MCHC 34.1  RDW 13.3  PLT 200   Cardiac EnzymesNo results for input(s): TROPONINI in the last 168  hours.  Recent Labs Lab 07/02/16 1001  TROPIPOC 0.00    BNPNo results for input(s): BNP, PROBNP in the last 168 hours.  DDimer No results for input(s): DDIMER in the last 168 hours.  Radiology/Studies:  Dg Chest 2 View  Result Date: 07/02/2016 CLINICAL DATA:  Chest pain. EXAM: CHEST  2 VIEW COMPARISON:  Radiographs of Jun 17, 2013. FINDINGS: The heart size and mediastinal contours are within normal limits. Atherosclerosis of thoracic aorta is noted. No pneumothorax or pleural effusion is noted. Interval development of ill-defined opacity in left upper lobe concerning for possible pneumonia. Stable bibasilar scarring is noted. The visualized skeletal structures are unremarkable. IMPRESSION: Aortic atherosclerosis. Ill-defined left upper lobe opacity is noted concerning for possible pneumonia. Followup PA and lateral chest X-ray is recommended in 3-4 weeks following trial of antibiotic therapy to ensure resolution and exclude underlying malignancy. Electronically Signed  By: Marijo Conception, M.D.   On: 07/02/2016 10:41   Ct Angio Chest/abd/pel For Dissection W And/or Wo Contrast  Result Date: 07/02/2016 CLINICAL DATA:  Chest pain. Pain between shoulder blades. Lower extremity swelling. Sternal fracture. EXAM: CT ANGIOGRAPHY CHEST, ABDOMEN AND PELVIS TECHNIQUE: Multidetector CT imaging through the chest, abdomen and pelvis was performed using the standard protocol during bolus administration of intravenous contrast. Multiplanar reconstructed images and MIPs were obtained and reviewed to evaluate the vascular anatomy. CONTRAST:  100 mL Isovue 370 COMPARISON:  None. FINDINGS: CTA CHEST FINDINGS Cardiovascular: Noncontrast imaging demonstrates no intramural hematoma within the aorta. Contrast enhanced imaging demonstrates no dissection of the ascending, transverse or descending thoracic aorta. No aneurysm. Great vessels normal. Coronary artery calcification and aortic atherosclerotic calcification. No  evidence of pulmonary embolism. Mediastinum/Nodes: No axillary or supraclavicular adenopathy. No mediastinal hilar adenopathy. No pericardial fluid. Esophagus normal. Lungs/Pleura: Cavitary lesion in the LEFT upper lobe with thickened walls measures 3.9 x 2.5 cm. Wall thickness of 7 mm. No additional pulmonary nodules. Musculoskeletal: No aggressive osseous lesion. Review of the MIP images confirms the above findings. CTA ABDOMEN AND PELVIS FINDINGS VASCULAR Aorta: No evidence of dissection of the abdominal aorta. There is aneurysmal dilatation of the infrarenal abdominal aorta to 56 x 59 mm in axial dimension. On sagittal imaging maximum dimension of the saccular aneurysm is 56 mm. The aneurysm has increased in size from 38 mm on CT 08/31/2012. Iliac arteries are normal caliber with intimal calcification. IMA is patent. Celiac trunk and SMA are patent. Celiac: Patent SMA: Patent Renals: Single and patent.  Ostial calcification IMA: Patent Inflow: Normal Veins: Normal Review of the MIP images confirms the above findings. NON-VASCULAR Hepatobiliary: No focal hepatic lesion.  Small gallstone. Pancreas: Pancreas is normal. No ductal dilatation. No pancreatic inflammation. Spleen: Normal spleen Adrenals/Urinary Tract: Adrenal glands and kidneys are normal. Ureters and bladder normal. Portion of the bladder wall does enter a RIGHT inguinal hernia. Stomach/Bowel: Stomach, small bowel, appendix, and cecum are normal. The colon and rectosigmoid colon are normal. Lymphatic: No adenopathy Reproductive: Prostate normal. Other: Bilateral fat filled inguinal hernias. Musculoskeletal: No aggressive osseous lesion. Review of the MIP images confirms the above findings. IMPRESSION: Chest Impression: 1. No evidence of thoracic aortic aneurysm or dissection. 2. Cavitary lesion in the LEFT upper lobe with thickened walls. Differential includes a pulmonary infection (consider fungal or mycobacterium infection) versus bronchogenic  carcinoma (e.g. squamous cell carcinoma). Recommend bronchoscopy for further evaluation with sampling / microbiology. 3. No pulmonary embolism. Abdomen / Pelvis Impression: 1. No aortic dissection. 2. Infrarenal saccular abdominal aortic aneurysm measuring up to 59 mm is increased in size from 2014. No evidence of acute inflammation or hemorrhage. Vascular surgery consultation recommended due to increased risk of rupture for AAA >5.5 cm. This recommendation follows ACR consensus guidelines: White Paper of the ACR Incidental Findings Committee II on Vascular Findings. J Am Coll Radiol 2013; 10:789-794. Electronically Signed   By: Suzy Bouchard M.D.   On: 07/02/2016 11:48    Assessment and Plan:   1. Chest pain - Concerning for unstable angina. His symptoms relieved after nitroglycerin x 2 in Er. EKG with nonspecific changes. Point-of-care troponin negative. His cardiac risk factor includes hypertension, obesity, tobacco smoking, family history and AAA.  2. AAA - has enlarged to 5.9cm. Will need vascular consult  3. Abnormal CTA - LUL concerning for for fungal or mycobacterium infection versus bronchogenic carcinoma. Seen by pulmonary. Recommended outpatient bronchoscopy with biopsy and follow-up with  health Department  4. Hypertension - Minimally elevated.  Recommended internal medicine admission for workup. He will need  A1c, Lipid panel and echo. R/o DVT on R leg. He went for doppler US.  He is npo, likely cath today. Dr. Gwenlyn Found to see.   Jarrett Soho, PA  07/02/2016 1:59 PM   Agree with note by Robbie Lis PA-C  71 y/o MWM admitted with crescendo angina. + CRF including Fx, Tob. He has a growing AAA (3.4---> 5.9 cm), as well as a cavitary lung nodule. He has had CP for at least a month, worse yesterday. Currently pain free. Exam benign. EKG w/o acute changes. Enz neg. High suspicion for CAD. Needs cath this afternoon. Very nervous gentleman who insists on going home this  evening.   The patient understands that risks included but are not limited to stroke (1 in 1000), death (1 in 60), kidney failure [usually temporary] (1 in 500), bleeding (1 in 200), allergic reaction [possibly serious] (1 in 200). The patient understands and agrees to proceed  Lorretta Harp, M.D., [place], [facility], [place], [place] [address]. [place], [place]  [address]  [phone] 07/02/2016 3:08 PM

## 2016-07-02 NOTE — Progress Notes (Signed)
**  Preliminary report by tech**  Right lower extremity venous duplex complete. There is no evidence of deep or superficial vein thrombosis involving the right lower extremity. All visualized vessels appear patent and compressible. There is no evidence of a Baker's cyst on the right. Results were given to the patient's nurse, Janett Billow.   07/02/16 2:24 PM Tyler Mcmillan RVT

## 2016-07-02 NOTE — ED Notes (Signed)
Patient reports chest pain 2/10 at this time. Declines 3rd NTG tab.

## 2016-07-02 NOTE — Consult Note (Signed)
Name: Tyler Mcmillan MRN: [id number] DOB: 30-Apr-1945    ADMISSION DATE:  07/02/2016 CONSULTATION DATE:  07/02/2016  REFERRING MD :  Jeanell Sparrow  CHIEF COMPLAINT:  LUL Cavitary lesion   BRIEF PATIENT DESCRIPTION: 71 year old obese male supine on stretcher in ED on RA in NAD.  SIGNIFICANT EVENTS  5/25 admit to ED for L chest and back pain  STUDIES:   CTA 07/02/2016 Cavitary lesion in the LEFT upper lobe with thickened walls. Differential includes a pulmonary infection (consider fungal or mycobacterium infection) versus bronchogenic carcinoma (e.g. squamous cell carcinoma). Recommend bronchoscopy for further evaluation with sampling / microbiology.   HISTORY OF PRESENT ILLNESS:   71 year old male current smoker (  41.25 pack year smoking history) presents to the Ed 5/25 with left sided chest and back pain x 48 hours. He has had non-productive cough, nausea and swelling of the lower extremities. CXR revealed LUL cavitary lesion, which appears to be fluid filled. 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 No elicit drugs or jail time. No clinical S/S of TB. He has recently had mold exposure secondary to leak in roof at home. CCM consulted for evaluation  of LUL cavitary lesion   PAST MEDICAL HISTORY :   has a past medical history of AAA (abdominal aortic aneurysm) without rupture ([place]); Bladder neoplasm; Chronic anxiety; GERD (gastroesophageal reflux disease); and Hypertension.  has a past surgical history that includes Transurethral resection of bladder tumor with gyrus (turbt-gyrus) (N/A, 10/04/2012); Cystoscopy w/ ureteral stent placement (Left, 10/04/2012); and Meatotomy (N/A, 10/04/2012). Prior to Admission medications   Medication Sig Start Date End Date Taking? Authorizing Provider  albuterol (PROVENTIL HFA;VENTOLIN HFA) 108 (90 BASE) MCG/ACT inhaler Inhale 2 puffs into the lungs every 6 (six) hours as needed for wheezing or shortness of breath. Patient not taking: Reported on  07/09/2014 06/17/13   Wendie Agreste, MD  atenolol (TENORMIN) 50 MG tablet Take 50 mg by mouth every morning.    [provider]  azithromycin (ZITHROMAX) 250 MG tablet Take 2 pills by mouth on day 1, then 1 pill by mouth per day on days 2 through 5. Patient not taking: Reported on 07/09/2014 06/17/13   Wendie Agreste, MD  clonazePAM (KLONOPIN) 2 MG tablet Take 0.5 mg by mouth daily as needed for anxiety.     [provider]  omeprazole (PRILOSEC) 20 MG capsule Take 20 mg by mouth as needed.     [provider]  oxybutynin (DITROPAN) 5 MG tablet Take 1 tablet (5 mg total) by mouth every 8 (eight) hours as needed. For bladder spasms / stent discomfort Patient not taking: Reported on 07/09/2014 10/04/12   Alexis Frock, MD  predniSONE (DELTASONE) 20 MG tablet Take 2 tablets (40 mg total) by mouth daily with breakfast. Patient not taking: Reported on 07/09/2014 06/17/13   Wendie Agreste, MD  senna-docusate (SENOKOT-S) 8.6-50 MG per tablet Take 1 tablet by mouth 2 (two) times daily. While taking pain medications to prevent constipation Patient not taking: Reported on 07/09/2014 10/04/12   Alexis Frock, MD  sulfamethoxazole-trimethoprim (BACTRIM DS) 800-160 MG per tablet Take 1 tablet by mouth daily. X 3 days. Start day prior to next Urology appointment Patient not taking: Reported on 07/09/2014 10/04/12   Alexis Frock, MD  traMADol (ULTRAM) 50 MG tablet Take 1-2 tablets (50-100 mg total) by mouth every 6 (six) hours as needed for pain. Patient not taking: Reported on 07/09/2014 10/04/12   Alexis Frock, MD  No Known Allergies  FAMILY HISTORY:  family history includes Cancer in his brother and mother; Heart attack in his father; Heart disease in his father. SOCIAL HISTORY:  reports that he has been smoking Cigarettes.  He has a 25.00 pack-year smoking history. He has never used smokeless tobacco. He reports that he drinks about 14.4 oz of alcohol per week . He  reports that he does not use drugs.  REVIEW OF SYSTEMS:   Constitutional: Negative for fever, chills, weight loss, malaise/fatigue and diaphoresis.  HENT: Negative for hearing loss, ear pain, nosebleeds, congestion, sore throat, neck pain, tinnitus and ear discharge.  + for PND Eyes: Negative for blurred vision, double vision, photophobia, pain, discharge and redness.  Respiratory:Positive for non-productive cough, Negative for hemoptysis, sputum production, +shortness of breath, no wheezing and stridor.   Cardiovascular: Positive  for chest pain,No  palpitations, orthopnea, claudication,+ leg swelling R>L and PND.  Gastrointestinal: Negative for heartburn,+ nausea, no vomiting, abdominal pain, diarrhea, constipation, blood in stool and melena.  Genitourinary: Negative for dysuria, urgency, frequency, hematuria and flank pain.  Musculoskeletal: Negative for myalgias, + LU back pain, no  joint pain and falls.  Skin: Negative for itching and rash.  Neurological: Negative for dizziness, tingling, tremors, sensory change, speech change, focal weakness, seizures, loss of consciousness, weakness and headaches.  Endo/Heme/Allergies: Negative for environmental allergies and polydipsia. Does not bruise/bleed easily.  SUBJECTIVE:  Awake and alert, with c/o left chest pain and left back pain. States he wants to go home as he has company coming.  VITAL SIGNS: Temp:  [99.8 F (37.7 C)] 99.8 F (37.7 C) (05/25 0950) Pulse Rate:  [79-87] 79 (05/25 1230) Resp:  [16-24] 22 (05/25 1230) BP: (121-159)/(62-89) 149/82 (05/25 1230) SpO2:  [92 %-96 %] 94 % (05/25 1230) Weight:  [190 lb (86.2 kg)] 190 lb (86.2 kg) (05/25 0950)  PHYSICAL EXAMINATION: General:  Awake alert and oriented x 3, supine on stretcher in NAD Neuro: MAE x 4, A&O x3, cranial nerves intact HEENT: Normocephalic, atraumatic Cardiovascular:  RRR, S1, S2, No RMG Lungs:  Clear throughout, no nasal flaring, no sternal retractions, saturations  97% on RA Abdomen:Distended, non-tender, obese Musculoskeletal:  No obvious deformities Skin:  Clean dry and intact, no rash, no lesions   Recent Labs Lab 07/02/16 0958  NA 130*  K 4.4  CL 97*  CO2 24  BUN 8  CREATININE 0.88  GLUCOSE 160*    Recent Labs Lab 07/02/16 0958  HGB 16.2  HCT 47.5  WBC 10.5  PLT 200   Dg Chest 2 View  Result Date: 07/02/2016 CLINICAL DATA:  Chest pain. EXAM: CHEST  2 VIEW COMPARISON:  Radiographs of Jun 17, 2013. FINDINGS: The heart size and mediastinal contours are within normal limits. Atherosclerosis of thoracic aorta is noted. No pneumothorax or pleural effusion is noted. Interval development of ill-defined opacity in left upper lobe concerning for possible pneumonia. Stable bibasilar scarring is noted. The visualized skeletal structures are unremarkable. IMPRESSION: Aortic atherosclerosis. Ill-defined left upper lobe opacity is noted concerning for possible pneumonia. Followup PA and lateral chest X-ray is recommended in 3-4 weeks following trial of antibiotic therapy to ensure resolution and exclude underlying malignancy. Electronically Signed   By: Marijo Conception, M.D.   On: 07/02/2016 10:41   Ct Angio Chest/abd/pel For Dissection W And/or Wo Contrast  Result Date: 07/02/2016 CLINICAL DATA:  Chest pain. Pain between shoulder blades. Lower extremity swelling. Sternal fracture. EXAM: CT ANGIOGRAPHY CHEST, ABDOMEN AND PELVIS TECHNIQUE: Multidetector CT  imaging through the chest, abdomen and pelvis was performed using the standard protocol during bolus administration of intravenous contrast. Multiplanar reconstructed images and MIPs were obtained and reviewed to evaluate the vascular anatomy. CONTRAST:  100 mL Isovue 370 COMPARISON:  None. FINDINGS: CTA CHEST FINDINGS Cardiovascular: Noncontrast imaging demonstrates no intramural hematoma within the aorta. Contrast enhanced imaging demonstrates no dissection of the ascending, transverse or descending  thoracic aorta. No aneurysm. Great vessels normal. Coronary artery calcification and aortic atherosclerotic calcification. No evidence of pulmonary embolism. Mediastinum/Nodes: No axillary or supraclavicular adenopathy. No mediastinal hilar adenopathy. No pericardial fluid. Esophagus normal. Lungs/Pleura: Cavitary lesion in the LEFT upper lobe with thickened walls measures 3.9 x 2.5 cm. Wall thickness of 7 mm. No additional pulmonary nodules. Musculoskeletal: No aggressive osseous lesion. Review of the MIP images confirms the above findings. CTA ABDOMEN AND PELVIS FINDINGS VASCULAR Aorta: No evidence of dissection of the abdominal aorta. There is aneurysmal dilatation of the infrarenal abdominal aorta to 56 x 59 mm in axial dimension. On sagittal imaging maximum dimension of the saccular aneurysm is 56 mm. The aneurysm has increased in size from 38 mm on CT 08/31/2012. Iliac arteries are normal caliber with intimal calcification. IMA is patent. Celiac trunk and SMA are patent. Celiac: Patent SMA: Patent Renals: Single and patent.  Ostial calcification IMA: Patent Inflow: Normal Veins: Normal Review of the MIP images confirms the above findings. NON-VASCULAR Hepatobiliary: No focal hepatic lesion.  Small gallstone. Pancreas: Pancreas is normal. No ductal dilatation. No pancreatic inflammation. Spleen: Normal spleen Adrenals/Urinary Tract: Adrenal glands and kidneys are normal. Ureters and bladder normal. Portion of the bladder wall does enter a RIGHT inguinal hernia. Stomach/Bowel: Stomach, small bowel, appendix, and cecum are normal. The colon and rectosigmoid colon are normal. Lymphatic: No adenopathy Reproductive: Prostate normal. Other: Bilateral fat filled inguinal hernias. Musculoskeletal: No aggressive osseous lesion. Review of the MIP images confirms the above findings. IMPRESSION: Chest Impression: 1. No evidence of thoracic aortic aneurysm or dissection. 2. Cavitary lesion in the LEFT upper lobe with  thickened walls. Differential includes a pulmonary infection (consider fungal or mycobacterium infection) versus bronchogenic carcinoma (e.g. squamous cell carcinoma). Recommend bronchoscopy for further evaluation with sampling / microbiology. 3. No pulmonary embolism. Abdomen / Pelvis Impression: 1. No aortic dissection. 2. Infrarenal saccular abdominal aortic aneurysm measuring up to 59 mm is increased in size from 2014. No evidence of acute inflammation or hemorrhage. Vascular surgery consultation recommended due to increased risk of rupture for AAA >5.5 cm. This recommendation follows ACR consensus guidelines: White Paper of the ACR Incidental Findings Committee II on Vascular Findings. J Am Coll Radiol 2013; 10:789-794. Electronically Signed   By: Suzy Bouchard M.D.   On: 07/02/2016 11:48    ASSESSMENT / PLAN:  LUL Cavitary Lesion per CT 5/25 Concerning for fungal or mycobacterium infection versus bronchogenic carcinoma  Will need to R/O TB ( Clinically asymptomatic) Will need to evaluate for  malignancy Saturations are 96% on RA Plan: Quantiferon Gold now( Per ID recommendation) Pt. To call [facility] Dept at [phone] to set up for AFB cultures( Per ID recommendation) Follow up Outpatient Pulmonary Appointment within two weeks for consult eval for bronchoscopy ( Pulmonary Office has been notified and are working on setting up appointment within next 2 weeks) Will need Bronch with biopsies and BAL to R/O primary bronchogenic malignancy vs fungal or micobacterial infection.   Magdalen Spatz, AGACNP-BC [place] Pager # [phone] If no answer Pager: [phone]  07/02/2016,  1:03 PM   STAFF NOTE: I, Merrie Roof, MD FACP have personally reviewed patient's available data, including medical history, events of note, physical examination and test results as part of my evaluation. I have discussed with resident/NP and other care providers such as  pharmacist, RN and RRT. In addition, I personally evaluated patient and elicited key findings of: awake, alert, no distress, chest pain described mid chest relived, lungs clear, no lymphadenopathy neck , abdo soft, edema increase size leg rt  Vs left, he has no h/o fever ( I elicited this), chills, wt loss, hemoptysis or TB exposure, no jail, or drug use, CT I reviewed reveals a cavitary LUL lung mass mostly fluid filled and some thickness on inferior portion, some notion of some mold exposure at home, his presentation does not c/w TB, will send to outpt halth dept for sputum samples and get quant gold now, am concerned mostly for malignancy, although not a classic appearance, no empiric vori, will set up for outpt pulm visit for bronch to follow and bx, if he gets admitted for DV tor Cardiology or AAA will follow, I updated pt in wife in full   Lavon Paganini. Titus Mould, MD, [place] Pgr: [facility] 07/02/2016 1:43 PM

## 2016-07-02 NOTE — ED Notes (Signed)
Pt at vasular [place].

## 2016-07-02 NOTE — Telephone Encounter (Signed)
Patient was scheduled for first available but needs to be seen within 2 weeks with one of our doctors.  Patient is at ED now, Tyler Mcmillan requesting we call patient later at home with appointment date and time.

## 2016-07-02 NOTE — Telephone Encounter (Signed)
Pt has been scheduled for 07/06/16 @ 3:00 with PM. It appears that pt is currently at ED. I will hold message in triage and call with appointment when home.

## 2016-07-02 NOTE — Consult Note (Signed)
Cardiology Consultation:   Patient ID: Tyler Mcmillan; [id number]; 1945-03-01   Admit date: 07/02/2016 Date of Consult: 07/02/2016  Primary Care Provider: [name], MD Primary Cardiologist: New to Dr. Gwenlyn Found    Patient Profile:   Tyler Mcmillan is a 71 y.o. male with a hx of AAA, HTN, GERD and ongoing tobacco abuse who is being seen today for the evaluation of CP at the request of Dr. Jeanell Sparrow  History of Present Illness:   Last documented AAA of 3.5cm  in 07/2012. Prior routine exercise stress test was normal in 2014. Patient smokes half a pack for the past 55 years. Father had a MI at age 40. Younger brother has exposure to agent orange and had lung cancer.  Patient presented with substernal chest pressure radiating to his back. Symptoms started last night that spontaneously resolved after aspirin 324 mg. However, he woke up with similar symptoms at 4 AM that was constant leading to ER presentation. He is chest pain free now after sublingual nitroglycerin x 2. He denies associated nausea, vomiting, shortness of breath or diaphoresis. He has being having intermittent episode of chest tightness with radiation with and without activity for the past couple of months. Recently worsened. Mostly sedentary lifestyle.   EKG shows sinus rhythm at rate of 90 bpm, probably peaked T waves and ST segment abnormality in lead V3 and aVF. Point-of-care troponin negative. Potassium 4.4. MCV 104. Chest x-ray concerning for malignancy versus infective process on the left upper lobe.  CT angiogram did not show any dissection or PE. Coronary artery calcification and aortic atherosclerotic.   Also showed Cavitary lesion in the LEFT upper lobe with thickened walls. Differential includes a pulmonary infection (consider fungal or mycobacterium infection) versus bronchogenic carcinoma (e.g. squamous cell carcinoma). Recommend bronchoscopy for further evaluation with sampling / microbiology.  Calcification.  Infrarenal saccular abdominal aortic aneurysm measuring up to 59 mm is increased in size from 2014. No evidence of acute inflammation or hemorrhage. Vascular surgery consultation recommended due to increased risk of rupture for AAA >5.5 cm.   The patient was consulted by pulmonary. Recommended outpatient bronchoscopy and follow-up with health Department for TB work up.   Past Medical History:  Diagnosis Date  . AAA (abdominal aortic aneurysm) without rupture ([place])    [place] ABD.  DONE  JUNE 2014  3.5  . Bladder neoplasm   . Chronic anxiety   . GERD (gastroesophageal reflux disease)   . Hypertension     Past Surgical History:  Procedure Laterality Date  . CYSTOSCOPY W/ URETERAL STENT PLACEMENT Left 10/04/2012   Procedure: CYSTOSCOPY WITH RETROGRADE PYELOGRAM/URETERAL STENT PLACEMENT   "POSSIBLE LEFT STENT";  Surgeon: Alexis Frock, MD;  Location: [facility];  Service: Urology;  Laterality: Left;  [name] MEATOTOMY N/A 10/04/2012   Procedure: MEATOTOMY ADULT;  Surgeon: Alexis Frock, MD;  Location: [facility];  Service: Urology;  Laterality: N/A;  . TRANSURETHRAL RESECTION OF BLADDER TUMOR WITH GYRUS (TURBT-GYRUS) N/A 10/04/2012   Procedure: TRANSURETHRAL RESECTION OF BLADDER TUMOR WITH GYRUS (TURBT-GYRUS);  Surgeon: Alexis Frock, MD;  Location: [facility];  Service: Urology;  Laterality: N/A;     Inpatient Medications: Scheduled Meds:  Continuous Infusions:  PRN Meds: nitroGLYCERIN  Allergies:   No Known Allergies  Social History:   Social History   Social History  . Marital status: Married    Spouse name: N/A  . Number of children: N/A  . Years of education: N/A   Occupational History  .  Not on file.   Social History Main Topics  . Smoking status: Current Every Day Smoker    Packs/day: 0.50    Years: 50.00    Types: Cigarettes  . Smokeless tobacco: Never Used  . Alcohol use 14.4 oz/week    10 Glasses of  wine, 14 Cans of beer per week     Comment: few glases wine per day  . Drug use: No  . Sexual activity: Not on file   Other Topics Concern  . Not on file   Social History Narrative  . No narrative on file    Family History:   The patient's family history includes Cancer in his brother and mother; Heart attack in his father; Heart disease in his father.  ROS:  Please see the history of present illness.  All other ROS reviewed and negative.     Physical Exam/Data:   Vitals:   07/02/16 1115 07/02/16 1130 07/02/16 1215 07/02/16 1230  BP: 126/62 (!) 142/74 (!) 146/87 (!) 149/82  Pulse: 81 82 80 79  Resp: (!) 24 19 (!) 24 (!) 22  Temp:      TempSrc:      SpO2: 92% 96% 95% 94%  Weight:      Height:       No intake or output data in the 24 hours ending 07/02/16 1359 Filed Weights   07/02/16 0950  Weight: 190 lb (86.2 kg)   Body mass index is 27.26 kg/m.  General:  Well nourished, well developed, in no acute distress HEENT: normal Lymph: no adenopathy Neck: no JVD Endocrine:  No thryomegaly Vascular: No carotid bruits; FA pulses 2+ bilaterally without bruits  Cardiac:  normal S1, S2; RRR; no murmur Lungs:Scattered diffuse wheezing. Abd: soft, nontender, no hepatomegaly  Ext: Tress lower extremity edema r> L Musculoskeletal:  No deformities, BUE and BLE strength normal and equal Skin: warm and dry  Neuro:  CNs 2-12 intact, no focal abnormalities noted Psych:  Normal affect    Laboratory Data:  Chemistry Recent Labs Lab 07/02/16 0958  NA 130*  K 4.4  CL 97*  CO2 24  GLUCOSE 160*  BUN 8  CREATININE 0.88  CALCIUM 8.7*  GFRNONAA >60  GFRAA >60  ANIONGAP 9    No results for input(s): PROT, ALBUMIN, AST, ALT, ALKPHOS, BILITOT in the last 168 hours. Hematology Recent Labs Lab 07/02/16 0958  WBC 10.5  RBC 4.56  HGB 16.2  HCT 47.5  MCV 104.2*  MCH 35.5*  MCHC 34.1  RDW 13.3  PLT 200   Cardiac EnzymesNo results for input(s): TROPONINI in the last 168  hours.  Recent Labs Lab 07/02/16 1001  TROPIPOC 0.00    BNPNo results for input(s): BNP, PROBNP in the last 168 hours.  DDimer No results for input(s): DDIMER in the last 168 hours.  Radiology/Studies:  Dg Chest 2 View  Result Date: 07/02/2016 CLINICAL DATA:  Chest pain. EXAM: CHEST  2 VIEW COMPARISON:  Radiographs of Jun 17, 2013. FINDINGS: The heart size and mediastinal contours are within normal limits. Atherosclerosis of thoracic aorta is noted. No pneumothorax or pleural effusion is noted. Interval development of ill-defined opacity in left upper lobe concerning for possible pneumonia. Stable bibasilar scarring is noted. The visualized skeletal structures are unremarkable. IMPRESSION: Aortic atherosclerosis. Ill-defined left upper lobe opacity is noted concerning for possible pneumonia. Followup PA and lateral chest X-ray is recommended in 3-4 weeks following trial of antibiotic therapy to ensure resolution and exclude underlying malignancy. Electronically Signed  By: Marijo Conception, M.D.   On: 07/02/2016 10:41   Ct Angio Chest/abd/pel For Dissection W And/or Wo Contrast  Result Date: 07/02/2016 CLINICAL DATA:  Chest pain. Pain between shoulder blades. Lower extremity swelling. Sternal fracture. EXAM: CT ANGIOGRAPHY CHEST, ABDOMEN AND PELVIS TECHNIQUE: Multidetector CT imaging through the chest, abdomen and pelvis was performed using the standard protocol during bolus administration of intravenous contrast. Multiplanar reconstructed images and MIPs were obtained and reviewed to evaluate the vascular anatomy. CONTRAST:  100 mL Isovue 370 COMPARISON:  None. FINDINGS: CTA CHEST FINDINGS Cardiovascular: Noncontrast imaging demonstrates no intramural hematoma within the aorta. Contrast enhanced imaging demonstrates no dissection of the ascending, transverse or descending thoracic aorta. No aneurysm. Great vessels normal. Coronary artery calcification and aortic atherosclerotic calcification. No  evidence of pulmonary embolism. Mediastinum/Nodes: No axillary or supraclavicular adenopathy. No mediastinal hilar adenopathy. No pericardial fluid. Esophagus normal. Lungs/Pleura: Cavitary lesion in the LEFT upper lobe with thickened walls measures 3.9 x 2.5 cm. Wall thickness of 7 mm. No additional pulmonary nodules. Musculoskeletal: No aggressive osseous lesion. Review of the MIP images confirms the above findings. CTA ABDOMEN AND PELVIS FINDINGS VASCULAR Aorta: No evidence of dissection of the abdominal aorta. There is aneurysmal dilatation of the infrarenal abdominal aorta to 56 x 59 mm in axial dimension. On sagittal imaging maximum dimension of the saccular aneurysm is 56 mm. The aneurysm has increased in size from 38 mm on CT 08/31/2012. Iliac arteries are normal caliber with intimal calcification. IMA is patent. Celiac trunk and SMA are patent. Celiac: Patent SMA: Patent Renals: Single and patent.  Ostial calcification IMA: Patent Inflow: Normal Veins: Normal Review of the MIP images confirms the above findings. NON-VASCULAR Hepatobiliary: No focal hepatic lesion.  Small gallstone. Pancreas: Pancreas is normal. No ductal dilatation. No pancreatic inflammation. Spleen: Normal spleen Adrenals/Urinary Tract: Adrenal glands and kidneys are normal. Ureters and bladder normal. Portion of the bladder wall does enter a RIGHT inguinal hernia. Stomach/Bowel: Stomach, small bowel, appendix, and cecum are normal. The colon and rectosigmoid colon are normal. Lymphatic: No adenopathy Reproductive: Prostate normal. Other: Bilateral fat filled inguinal hernias. Musculoskeletal: No aggressive osseous lesion. Review of the MIP images confirms the above findings. IMPRESSION: Chest Impression: 1. No evidence of thoracic aortic aneurysm or dissection. 2. Cavitary lesion in the LEFT upper lobe with thickened walls. Differential includes a pulmonary infection (consider fungal or mycobacterium infection) versus bronchogenic  carcinoma (e.g. squamous cell carcinoma). Recommend bronchoscopy for further evaluation with sampling / microbiology. 3. No pulmonary embolism. Abdomen / Pelvis Impression: 1. No aortic dissection. 2. Infrarenal saccular abdominal aortic aneurysm measuring up to 59 mm is increased in size from 2014. No evidence of acute inflammation or hemorrhage. Vascular surgery consultation recommended due to increased risk of rupture for AAA >5.5 cm. This recommendation follows ACR consensus guidelines: White Paper of the ACR Incidental Findings Committee II on Vascular Findings. J Am Coll Radiol 2013; 10:789-794. Electronically Signed   By: Suzy Bouchard M.D.   On: 07/02/2016 11:48    Assessment and Plan:   1. Chest pain - Concerning for unstable angina. His symptoms relieved after nitroglycerin x 2 in [place]. EKG with nonspecific changes. Point-of-care troponin negative. His cardiac risk factor includes hypertension, obesity, tobacco smoking, family history and AAA.  2. AAA - has enlarged to 5.9cm. Will need vascular consult  3. Abnormal CTA - LUL concerning for for fungal or mycobacterium infection versus bronchogenic carcinoma. Seen by pulmonary. Recommended outpatient bronchoscopy with biopsy and follow-up with  health Department  4. Hypertension - Minimally elevated.  Recommended internal medicine admission for workup. He will need  A1c, Lipid panel and echo. R/o DVT on R leg. He went for doppler US.  He is npo, likely cath today. Dr. Gwenlyn Found to see.   Jarrett Soho, PA  07/02/2016 1:59 PM   Agree with note by Robbie Lis PA-C  71 y/o MWM admitted with crescendo angina. + CRF including Fx, Tob. He has a growing AAA (3.4---> 5.9 cm), as well as a cavitary lung nodule. He has had CP for at least a month, worse yesterday. Currently pain free. Exam benign. EKG w/o acute changes. Enz neg. High suspicion for CAD. Needs cath this afternoon. Very nervous gentleman who insists on going home this  evening.   The patient understands that risks included but are not limited to stroke (1 in 1000), death (1 in 57), kidney failure [usually temporary] (1 in 500), bleeding (1 in 200), allergic reaction [possibly serious] (1 in 200). The patient understands and agrees to proceed  Lorretta Harp, M.D., [place], [facility], [employer], [place] [address] [place], [place]  [address]  [phone] 07/02/2016 3:08 PM

## 2016-07-02 NOTE — ED Provider Notes (Addendum)
[facility] DEPT Provider Note   CSN: [id number] Arrival date & time: 07/02/16  1517     History   Chief Complaint Chief Complaint  Patient presents with  . Chest Pain    HPI Tyler Mcmillan is a 71 y.o. male.  HPI  71 year old man presents today complaining of chest pain. He states that he had substernal chest pain that radiated through to his left shoulder yesterday. It Was present at some level through the day and through the night. It did worsen after he went to bed. He describes the pain as sharp. He waited until the morning and had his wife bring him to the hospital. He states it was severe during the night resulting open number on it. He states is currently about 4 out of 10. Some cough but no fever, chills, or sputum production. The pain does not appear to be associated with positioning or exertion. He has had no similar symptoms in the past. History of an abdominal aortic aneurysm but has not been followed for that for the past 2 years. Primary care doctor is Dr. Concha Pyo. He states he has had some unilateral swelling of his right leg. He denies any history of DVT or PE.  Past Medical History:  Diagnosis Date  . AAA (abdominal aortic aneurysm) without rupture ([place])    [place] ABD.  DONE  JUNE 2014  3.5  . Bladder neoplasm   . Chronic anxiety   . GERD (gastroesophageal reflux disease)   . Hypertension     Patient Active Problem List   Diagnosis Date Noted  . AAA (abdominal aortic aneurysm) without rupture ([place]) 07/09/2014  . Lumbar degenerative disc disease 07/09/2014    Past Surgical History:  Procedure Laterality Date  . CYSTOSCOPY W/ URETERAL STENT PLACEMENT Left 10/04/2012   Procedure: CYSTOSCOPY WITH RETROGRADE PYELOGRAM/URETERAL STENT PLACEMENT   "POSSIBLE LEFT STENT";  Surgeon: Alexis Frock, MD;  Location: [facility];  Service: Urology;  Laterality: Left;  [name] MEATOTOMY N/A 10/04/2012   Procedure: MEATOTOMY ADULT;  Surgeon: Alexis Frock, MD;  Location:  [facility];  Service: Urology;  Laterality: N/A;  . TRANSURETHRAL RESECTION OF BLADDER TUMOR WITH GYRUS (TURBT-GYRUS) N/A 10/04/2012   Procedure: TRANSURETHRAL RESECTION OF BLADDER TUMOR WITH GYRUS (TURBT-GYRUS);  Surgeon: Alexis Frock, MD;  Location: [facility];  Service: Urology;  Laterality: N/A;       Home Medications    Prior to Admission medications   Medication Sig Start Date End Date Taking? Authorizing Provider  albuterol (PROVENTIL HFA;VENTOLIN HFA) 108 (90 BASE) MCG/ACT inhaler Inhale 2 puffs into the lungs every 6 (six) hours as needed for wheezing or shortness of breath. Patient not taking: Reported on 07/09/2014 06/17/13   Wendie Agreste, MD  atenolol (TENORMIN) 50 MG tablet Take 50 mg by mouth every morning.    [provider]  azithromycin (ZITHROMAX) 250 MG tablet Take 2 pills by mouth on day 1, then 1 pill by mouth per day on days 2 through 5. Patient not taking: Reported on 07/09/2014 06/17/13   Wendie Agreste, MD  clonazePAM (KLONOPIN) 2 MG tablet Take 0.5 mg by mouth daily as needed for anxiety.     [provider]  omeprazole (PRILOSEC) 20 MG capsule Take 20 mg by mouth as needed.     [provider]  oxybutynin (DITROPAN) 5 MG tablet Take 1 tablet (5 mg total) by mouth every 8 (eight) hours as needed. For bladder spasms / stent discomfort Patient  not taking: Reported on 07/09/2014 10/04/12   Alexis Frock, MD  predniSONE (DELTASONE) 20 MG tablet Take 2 tablets (40 mg total) by mouth daily with breakfast. Patient not taking: Reported on 07/09/2014 06/17/13   Wendie Agreste, MD  senna-docusate (SENOKOT-S) 8.6-50 MG per tablet Take 1 tablet by mouth 2 (two) times daily. While taking pain medications to prevent constipation Patient not taking: Reported on 07/09/2014 10/04/12   Alexis Frock, MD  sulfamethoxazole-trimethoprim (BACTRIM DS) 800-160 MG per tablet Take 1 tablet by mouth daily. X 3 days. Start day  prior to next Urology appointment Patient not taking: Reported on 07/09/2014 10/04/12   Alexis Frock, MD  traMADol (ULTRAM) 50 MG tablet Take 1-2 tablets (50-100 mg total) by mouth every 6 (six) hours as needed for pain. Patient not taking: Reported on 07/09/2014 10/04/12   Alexis Frock, MD    Family History Family History  Problem Relation Age of Onset  . Cancer Mother   . Heart disease Father        before age 86  . Heart attack Father   . Cancer Brother     Social History Social History  Substance Use Topics  . Smoking status: Current Every Day Smoker    Packs/day: 0.50    Years: 50.00    Types: Cigarettes  . Smokeless tobacco: Never Used  . Alcohol use 14.4 oz/week    10 Glasses of wine, 14 Cans of beer per week     Comment: few glases wine per day     Allergies   Patient has no known allergies.   Review of Systems Review of Systems  Constitutional: Negative for activity change, appetite change, chills, diaphoresis, fatigue and fever.  HENT: Negative.  Negative for congestion.   Eyes: Negative.   Respiratory: Positive for cough. Negative for shortness of breath.   Cardiovascular: Positive for chest pain and leg swelling. Negative for palpitations.  Gastrointestinal: Negative.   Endocrine: Negative.   Genitourinary: Negative.   Musculoskeletal: Negative.   Skin: Negative.   Allergic/Immunologic: Negative.   Neurological: Negative.   Hematological: Negative.   Psychiatric/Behavioral: Negative.   All other systems reviewed and are negative.    Physical Exam Updated Vital Signs BP 121/75   Pulse 84   Temp 99.8 F (37.7 C) (Oral)   Resp 16   Ht 1.778 m (5\' 10" )   Wt 86.2 kg (190 lb)   SpO2 93%   BMI 27.26 kg/m   Physical Exam  Constitutional: He is oriented to person, place, and time. He appears well-developed and well-nourished.  HENT:  Head: Normocephalic and atraumatic.  Right Ear: External ear normal.  Left Ear: External ear normal.    Mouth/Throat: Oropharynx is clear and moist.  Eyes: Conjunctivae are normal. Pupils are equal, round, and reactive to light.  Neck: Normal range of motion. Neck supple.  Cardiovascular: Normal rate, regular rhythm and normal heart sounds.   Pulmonary/Chest: Effort normal and breath sounds normal.  Abdominal: Soft. Bowel sounds are normal. He exhibits no distension. There is no tenderness. There is no guarding.  Musculoskeletal: Normal range of motion.  Neurological: He is alert and oriented to person, place, and time.  Skin: Skin is warm. Capillary refill takes less than 2 seconds.  Psychiatric: He has a normal mood and affect.  Nursing note and vitals reviewed.    ED Treatments / Results  Labs (all labs ordered are listed, but only abnormal results are displayed) Labs Reviewed  BASIC METABOLIC PANEL - Abnormal;  Notable for the following:       Result Value   Sodium 130 (*)    Chloride 97 (*)    Glucose, Bld 160 (*)    Calcium 8.7 (*)    All other components within normal limits  CBC - Abnormal; Notable for the following:    MCV 104.2 (*)    MCH 35.5 (*)    All other components within normal limits  I-STAT TROPOININ, ED    EKG  EKG Interpretation  Date/Time:  Friday Jul 02 2016 09:41:59 EDT Ventricular Rate:  90 PR Interval:  170 QRS Duration: 92 QT Interval:  346 QTC Calculation: 423 R Axis:   43 Text Interpretation:  Normal sinus rhythm Normal ECG t waves peaked. normal intervals. no STEMI, no old comparison Confirmed by Charlesetta Shanks [phone]) on 07/02/2016 9:48:01 AM       Radiology Dg Chest 2 View  Result Date: 07/02/2016 CLINICAL DATA:  Chest pain. EXAM: CHEST  2 VIEW COMPARISON:  Radiographs of Jun 17, 2013. FINDINGS: The heart size and mediastinal contours are within normal limits. Atherosclerosis of thoracic aorta is noted. No pneumothorax or pleural effusion is noted. Interval development of ill-defined opacity in left upper lobe concerning for possible  pneumonia. Stable bibasilar scarring is noted. The visualized skeletal structures are unremarkable. IMPRESSION: Aortic atherosclerosis. Ill-defined left upper lobe opacity is noted concerning for possible pneumonia. Followup PA and lateral chest X-[place] [facility] is recommended in 3-4 weeks following trial of antibiotic therapy to ensure resolution and exclude underlying malignancy. Electronically Signed   By: Marijo Conception, M.D.   On: 07/02/2016 10:41    Procedures Procedures (including critical care time)  Medications Ordered in ED Medications  nitroGLYCERIN (NITROSTAT) SL tablet 0.4 mg (0.4 mg Sublingual Given 07/02/16 1041)  aspirin chewable tablet 324 mg (324 mg Oral Given 07/02/16 1035)  iopamidol (ISOVUE-370) 76 % injection (100 mLs  Contrast Given 07/02/16 1112)     Initial Impression / Assessment and Plan / ED Course  I have reviewed the triage vital signs and the nursing notes.  Pertinent labs & imaging results that were available during my care of the patient were reviewed by me and considered in my medical decision making (see chart for details).   1- chest pain- nsst changes ekg, initial troponin negative, repeat pending.   ? Etiology.  Patient with co rle swelling, no pe seen on ct.  Cavitary lesion noted lul.  Cardiology and pulmonary cosulted.  Cardiology advises admission for further evaluation and will cath asap 2- rle swelling- doppler pending- doppler negative 3-aaa- increased from to 3,8 to 5.9 cm- patient had previously been seen by Dr. Donnetta Hutching and last office [place]- 4.1-febvruary 2016-  If admitted, vascular will consult , if d/c'd they will call Tuesday to arrange for follow up 4-pulmonary lesion- discussed with Dr. Titus Mould and quantiferon ordered- will advise of likely admission.  5- anxiety-Patient on clonazepam at home 0.5 and states that it does not work. He is given 1 mg of Ativan IV here.    Final Clinical Impressions(s) / ED Diagnoses   Final diagnoses:  Unstable angina  ([place])  Abdominal aortic aneurysm (AAA) without rupture ([place])  Pulmonary lesion, left   Discussed with Dr. Aggie Moats and he will see for admission. New Prescriptions New Prescriptions   No medications on file     Pattricia Boss, MD 07/02/16 1512    Pattricia Boss, MD 07/02/16 1521    Pattricia Boss, MD 07/02/16 [phone]

## 2016-07-02 NOTE — ED Notes (Signed)
Dr. Jeanell Sparrow at bedside at this time.

## 2016-07-03 DIAGNOSIS — R911 Solitary pulmonary nodule: Principal | ICD-10-CM

## 2016-07-03 DIAGNOSIS — I119 Hypertensive heart disease without heart failure: Secondary | ICD-10-CM

## 2016-07-03 DIAGNOSIS — I714 Abdominal aortic aneurysm, without rupture: Secondary | ICD-10-CM

## 2016-07-03 MED ORDER — AMLODIPINE BESYLATE 2.5 MG PO TABS: 2.5000 mg | ORAL_TABLET | Freq: Every day | ORAL | Status: DC

## 2016-07-03 MED ORDER — AMLODIPINE BESYLATE 2.5 MG PO TABS: 2.5000 mg | ORAL_TABLET | Freq: Every day | ORAL | 0 refills | Status: DC

## 2016-07-03 NOTE — Consult Note (Signed)
[facility]ist of [place]  Patient name: Tyler Mcmillan MRN: [id number] DOB: [birth year] Sex: male  REASON FOR VISIT: Abdominal aortic aneurysm with some enlargement  HPI: Tyler Mcmillan is a 71 y.o. male I had seen as an outpatient 2 years ago. At that time he had a moderate size abdominal aortic aneurysm by ultrasound. This was less than 5 cm and he was to see [place] back for ongoing surveillance. He was lost to follow-up. He presented to the emergency room yesterday with chest pain and workup included CT of chest and abdomen. CT of his chest revealed cavitary lesion which will be worked up by pulmonary. He was found to have enlargement of his aneurysm now to 5.9 cm with no evidence of rupture. He also underwent a cardiac catheterization yesterday which showed no evidence of coronary disease. He has no symptoms referable to his aneurysm.  Past Medical History:  Diagnosis Date  . AAA (abdominal aortic aneurysm) without rupture ([place])    [place] ABD.  DONE  JUNE 2014  3.5  . Bladder neoplasm   . Chronic anxiety   . GERD (gastroesophageal reflux disease)   . Hypertension     Family History  Problem Relation Age of Onset  . Cancer Mother   . Heart disease Father        before age 52  . Heart attack Father   . Cancer Brother     SOCIAL HISTORY: Social History  Substance Use Topics  . Smoking status: Current Every Day Smoker    Packs/day: 0.50    Years: 50.00    Types: Cigarettes  . Smokeless tobacco: Never Used  . Alcohol use 14.4 oz/week    10 Glasses of wine, 14 Cans of beer per week     Comment: few glases wine per day    No Known Allergies  Current Facility-Administered Medications  Medication Dose Route Frequency Provider Last Rate Last Dose  . 0.9 %  sodium chloride infusion  250 mL Intravenous PRN Martinique, Peter M, MD      . acetaminophen (TYLENOL) tablet 650 mg  650 mg Oral Q4H PRN Elwin Mocha, MD      . amLODipine  (NORVASC) tablet 2.5 mg  2.5 mg Oral Daily Dorothy Spark, MD      . aspirin EC tablet 325 mg  325 mg Oral Daily Elwin Mocha, MD   325 mg at 07/03/16 3154  . atenolol (TENORMIN) tablet 50 mg  50 mg Oral q morning - 10a Elwin Mocha, MD   50 mg at 07/03/16 0086  . clonazePAM (KLONOPIN) tablet 0.5 mg  0.5 mg Oral BID PRN Elwin Mocha, MD   0.5 mg at 07/03/16 7619  . dextrose 5 % and 0.45 % NaCl with KCl 20 mEq/L infusion   Intravenous Continuous Elwin Mocha, MD 75 mL/hr at 07/03/16 0707    . heparin injection 5,000 Units  5,000 Units Subcutaneous Q8H [place], Peter M, MD   5,000 Units at 07/03/16 [phone]  . morphine 4 MG/ML injection 2 mg  2 mg Intravenous Q2H PRN Elwin Mocha, MD      . nitroGLYCERIN (NITROSTAT) SL tablet 0.4 mg  0.4 mg Sublingual Q5 min PRN Pattricia Boss, MD   0.4 mg at 07/02/16 1041  . ondansetron (ZOFRAN) injection 4 mg  4 mg Intravenous Q6H PRN Elwin Mocha, MD      . pantoprazole (PROTONIX) EC tablet 40 mg  40 mg Oral  Daily Elwin Mocha, MD      . sodium chloride flush (NS) 0.9 % injection 3 mL  3 mL Intravenous Q12H [place], Peter M, MD      . sodium chloride flush (NS) 0.9 % injection 3 mL  3 mL Intravenous PRN [place], Peter M, MD        REVIEW OF SYSTEMS:  [X]  denotes positive finding, [ ]  denotes negative finding Cardiac  Comments:  Chest pain or chest pressure: x   Shortness of breath upon exertion:    Short of breath when lying flat:    Irregular heart rhythm:        Vascular    Pain in calf, thigh, or hip brought on by ambulation:    Pain in feet at night that wakes you up from your sleep:     Blood clot in your veins:    Leg swelling:           PHYSICAL EXAM: Vitals:   07/02/16 2300 07/02/16 2345 07/03/16 0046 07/03/16 0451  BP: 140/77 137/73 (!) 152/82 (!) 141/75  Pulse: 90 91 91 85  Resp:  18  17  Temp:  98.8 F (37.1 C)  99.1 F (37.3 C)  TempSrc:  Oral  Oral  SpO2:  95%  93%  Weight:      Height:         GENERAL: The patient is a well-nourished male, in no acute distress. The vital signs are documented above. CARDIOVASCULAR: 2+ radial 2+ popliteal and 2+ dorsalis pedis pulses bilaterally. Abdomen soft with moderate obesity. No aneurysm palpable PULMONARY: There is good air exchange  MUSCULOSKELETAL: There are no major deformities or cyanosis. NEUROLOGIC: No focal weakness or paresthesias are detected. SKIN: There are no ulcers or rashes noted. PSYCHIATRIC: The patient has a normal affect.  DATA:  CT scan was reviewed. This does show enlargement of 5.9 cm. No evidence of leak  MEDICAL ISSUES: I discussed this at length with the patient. I have recommended elective repair. Would the defer elective repair until the workup of his lung lesion is complete. Assuming no life-threatening issue is found, would recommend elective repair in the near future. He does appear to be a candidate of stent graft repair and discussed this and open repair with the patient. Will continue to follow pulmonary status and workup. We will plan elective aneurysm repair after this is been resolved    [name], MD [facility] [phone] Pager [phone]

## 2016-07-03 NOTE — Progress Notes (Signed)
Patient discharged home with family, IV removed and instructions given on follow up care. No questions at this time.

## 2016-07-03 NOTE — Progress Notes (Signed)
Progress Note  Patient Name: Tyler Mcmillan Date of Encounter: 07/03/2016  Primary Cardiologist: Dr Gwenlyn Found  Subjective   No chest pain. No pain in the right wrist.   Inpatient Medications    Scheduled Meds: . aspirin EC  325 mg Oral Daily  . atenolol  50 mg Oral q morning - 10a  . heparin  5,000 Units Subcutaneous Q8H  . pantoprazole  40 mg Oral Daily  . sodium chloride flush  3 mL Intravenous Q12H   Continuous Infusions: . sodium chloride    . dextrose 5 % and 0.45 % NaCl with KCl 20 mEq/L 75 mL/hr at 07/03/16 0707   PRN Meds: sodium chloride, acetaminophen, clonazePAM, morphine injection, nitroGLYCERIN, ondansetron (ZOFRAN) IV, sodium chloride flush   Vital Signs    Vitals:   07/02/16 2300 07/02/16 2345 07/03/16 0046 07/03/16 0451  BP: 140/77 137/73 (!) 152/82 (!) 141/75  Pulse: 90 91 91 85  Resp:  18  17  Temp:  98.8 F (37.1 C)  99.1 F (37.3 C)  TempSrc:  Oral  Oral  SpO2:  95%  93%  Weight:      Height:        Intake/Output Summary (Last 24 hours) at 07/03/16 0907 Last data filed at 07/03/16 0200  Gross per 24 hour  Intake           382.23 ml  Output                0 ml  Net           382.23 ml   Filed Weights   07/02/16 0950 07/02/16 2220  Weight: 190 lb (86.2 kg) 197 lb 11.2 oz (89.7 kg)    Telemetry    SR - Personally Reviewed  ECG    SR, normal ECG - Personally Reviewed  Physical Exam   GEN: No acute distress.   Neck: No JVD Cardiac: RRR, no murmurs, rubs, or gallops.  Respiratory: Clear to auscultation bilaterally. GI: Soft, nontender, non-distended  MS: No edema; No deformity. Neuro:  Nonfocal  Psych: Normal affect   Labs    Chemistry Recent Labs Lab 07/02/16 0958 07/02/16 1802  NA 130*  --   K 4.4  --   CL 97*  --   CO2 24  --   GLUCOSE 160*  --   BUN 8  --   CREATININE 0.88 0.71  CALCIUM 8.7*  --   GFRNONAA >60 >60  GFRAA >60 >60  ANIONGAP 9  --      Hematology Recent Labs Lab 07/02/16 0958 07/02/16 1802    WBC 10.5 8.7  RBC 4.56 4.46  HGB 16.2 15.7  HCT 47.5 46.7  MCV 104.2* 104.7*  MCH 35.5* 35.2*  MCHC 34.1 33.6  RDW 13.3 13.3  PLT 200 201    Cardiac Enzymes Recent Labs Lab 07/02/16 1802 07/02/16 2124  TROPONINI <0.03 <0.03    Recent Labs Lab 07/02/16 1001 07/02/16 1455  TROPIPOC 0.00 0.00     BNPNo results for input(s): BNP, PROBNP in the last 168 hours.   DDimer No results for input(s): DDIMER in the last 168 hours.   Radiology    Dg Chest 2 View  Result Date: 07/02/2016 CLINICAL DATA:  Chest pain. EXAM: CHEST  2 VIEW COMPARISON:  Radiographs of Jun 17, 2013. FINDINGS: The heart size and mediastinal contours are within normal limits. Atherosclerosis of thoracic aorta is noted. No pneumothorax or pleural effusion is noted. Interval development of ill-defined  opacity in left upper lobe concerning for possible pneumonia. Stable bibasilar scarring is noted. The visualized skeletal structures are unremarkable. IMPRESSION: Aortic atherosclerosis. Ill-defined left upper lobe opacity is noted concerning for possible pneumonia. Followup PA and lateral chest X-ray is recommended in 3-4 weeks following trial of antibiotic therapy to ensure resolution and exclude underlying malignancy. Electronically Signed   By: Marijo Conception, M.D.   On: 07/02/2016 10:41   Ct Angio Chest/abd/pel For Dissection W And/or Wo Contrast  Result Date: 07/02/2016 CLINICAL DATA:  Chest pain. Pain between shoulder blades. Lower extremity swelling. Sternal fracture. EXAM: CT ANGIOGRAPHY CHEST, ABDOMEN AND PELVIS TECHNIQUE: Multidetector CT imaging through the chest, abdomen and pelvis was performed using the standard protocol during bolus administration of intravenous contrast. Multiplanar reconstructed images and MIPs were obtained and reviewed to evaluate the vascular anatomy. CONTRAST:  100 mL Isovue 370 COMPARISON:  None. FINDINGS: CTA CHEST FINDINGS Cardiovascular: Noncontrast imaging demonstrates no  intramural hematoma within the aorta. Contrast enhanced imaging demonstrates no dissection of the ascending, transverse or descending thoracic aorta. No aneurysm. Great vessels normal. Coronary artery calcification and aortic atherosclerotic calcification. No evidence of pulmonary embolism. Mediastinum/Nodes: No axillary or supraclavicular adenopathy. No mediastinal hilar adenopathy. No pericardial fluid. Esophagus normal. Lungs/Pleura: Cavitary lesion in the LEFT upper lobe with thickened walls measures 3.9 x 2.5 cm. Wall thickness of 7 mm. No additional pulmonary nodules. Musculoskeletal: No aggressive osseous lesion. Review of the MIP images confirms the above findings. CTA ABDOMEN AND PELVIS FINDINGS VASCULAR Aorta: No evidence of dissection of the abdominal aorta. There is aneurysmal dilatation of the infrarenal abdominal aorta to 56 x 59 mm in axial dimension. On sagittal imaging maximum dimension of the saccular aneurysm is 56 mm. The aneurysm has increased in size from 38 mm on CT 08/31/2012. Iliac arteries are normal caliber with intimal calcification. IMA is patent. Celiac trunk and SMA are patent. Celiac: Patent SMA: Patent Renals: Single and patent.  Ostial calcification IMA: Patent Inflow: Normal Veins: Normal Review of the MIP images confirms the above findings. NON-VASCULAR Hepatobiliary: No focal hepatic lesion.  Small gallstone. Pancreas: Pancreas is normal. No ductal dilatation. No pancreatic inflammation. Spleen: Normal spleen Adrenals/Urinary Tract: Adrenal glands and kidneys are normal. Ureters and bladder normal. Portion of the bladder wall does enter a RIGHT inguinal hernia. Stomach/Bowel: Stomach, small bowel, appendix, and cecum are normal. The colon and rectosigmoid colon are normal. Lymphatic: No adenopathy Reproductive: Prostate normal. Other: Bilateral fat filled inguinal hernias. Musculoskeletal: No aggressive osseous lesion. Review of the MIP images confirms the above findings.  IMPRESSION: Chest Impression: 1. No evidence of thoracic aortic aneurysm or dissection. 2. Cavitary lesion in the LEFT upper lobe with thickened walls. Differential includes a pulmonary infection (consider fungal or mycobacterium infection) versus bronchogenic carcinoma (e.g. squamous cell carcinoma). Recommend bronchoscopy for further evaluation with sampling / microbiology. 3. No pulmonary embolism. Abdomen / Pelvis Impression: 1. No aortic dissection. 2. Infrarenal saccular abdominal aortic aneurysm measuring up to 59 mm is increased in size from 2014. No evidence of acute inflammation or hemorrhage. Vascular surgery consultation recommended due to increased risk of rupture for AAA >5.5 cm. This recommendation follows ACR consensus guidelines: White Paper of the ACR Incidental Findings Committee II on Vascular Findings. J Am Coll Radiol 2013; 10:789-794. Electronically Signed   By: Suzy Bouchard M.D.   On: 07/02/2016 11:48    Cardiac Studies   Cath- mild nonobstructive CAD  Patient Profile     71 y.o. 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 BP elevated, I would add amlodipine 2.5 mg po daily to his regimen.  Right wrist with no bleeding, bruit, good pulses.   We will sign off.   Signed, Ena Dawley, MD  07/03/2016, 9:07 AM

## 2016-07-06 ENCOUNTER — Institutional Professional Consult (permissible substitution): Payer: Medicare Other | Admitting: Pulmonary Disease

## 2016-07-06 ENCOUNTER — Telehealth: Payer: Self-pay | Admitting: Vascular Surgery

## 2016-07-06 ENCOUNTER — Encounter (HOSPITAL_COMMUNITY): Payer: Self-pay | Admitting: Cardiology

## 2016-07-06 LAB — QUANTIFERON IN TUBE
QFT TB AG MINUS NIL VALUE: 0.01 [IU]/mL
QUANTIFERON MITOGEN VALUE: 8.17 [IU]/mL
QUANTIFERON NIL VALUE: 0.05 [IU]/mL
QUANTIFERON TB AG VALUE: 0.06 IU/mL
QUANTIFERON TB GOLD: NEGATIVE

## 2016-07-06 LAB — QUANTIFERON TB GOLD ASSAY (BLOOD)

## 2016-07-06 NOTE — Telephone Encounter (Signed)
-----   Message from Mena Goes, RN sent at 07/02/2016  3:11 PM EDT ----- Regarding: asap appt w/ [facility] is pt is discharged   ----- Message ----- From: [name], MD Sent: 07/02/2016  12:56 PM To: [facility] Clinical Pool  The patient presented to the emergency room and had a CT scan which showed enlargement of his aneurysm which was asymptomatic. Will be seen in consult if he is admitted. May be discharged home. If so can someone please call him early next week to arrange a office visit with me in the next several weeks

## 2016-07-06 NOTE — Telephone Encounter (Signed)
Called and spoke with pt and he is aware of appt with PM today.

## 2016-07-06 NOTE — Telephone Encounter (Signed)
Sched appt 07/07/16 at 10:15. Pt said he wanted to wait and will call back when he's ready to come in. Cxl'd the appt.

## 2016-07-07 ENCOUNTER — Ambulatory Visit: Payer: Medicare Other | Admitting: Vascular Surgery

## 2016-07-12 NOTE — Discharge Summary (Signed)
Physician Discharge Summary  Tyler Mcmillan UJW:[id number] DOB: 1945/07/06 DOA: 07/02/2016  PCP: Tyler Pretty, MD  Admit date: 07/02/2016 Discharge date: 07/03/2016  Admitted From: Home Disposition:  HOme.   Recommendations for Outpatient Follow-up:  1. Follow up with PCP in 1-2 weeks 2. Please obtain BMP/CBC in one week 3. Please follow up with vascular surgeon for the aneurysm 4. Please follow upw ith pulmonology as recommended.  5. Please follow up with health department for sputum afb.:    Discharge Condition: stable.  CODE STATUS: full code.  Diet recommendation: Heart Healthy   Brief/Interim Summary: Tyler Mcmillan is a 71 y.o. male  with past medical history of anxiety, bladder cancer, reflux and hypertension presented with a chief complaint of chest discomfort.  Discharge Diagnoses:  Principal Problem:   Chest pain Active Problems:   AAA (abdominal aortic aneurysm) ([name])   Cavitary lesion of lung   Hypertensive heart disease without heart failure  Chest discomfort:  Cardiology consulted and he underwent cardiac cath, which shows non obstructive CAD.  Chest discomfort resolved.   AAA; Vascular surgery consulted and plan for elective repair after pulmonology clears him.    Cavitary lesion of the lung:  Unlikely TB,  guantiferon gold ordered.  Recommended outpatient follow up with heath department.  Outpatient pulm follow up as recommended.   Discharge Instructions  Discharge Instructions    Diet - low sodium heart healthy    Complete by:  As directed    Discharge instructions    Complete by:  As directed    Please follow up with pulmonology as recommended in 2 weeks, for outpatient follow up and bronchoscopy with biopsies.  Please follow up with health department for sputum samples.  The number to call is Health Dept at [phone] to set up for AFB cultures( Per ID recommendation)  Please follow up with Dr Donnetta Hutching , vascular surgeon to follow up elective  repair of AAA.  Please follow up with your PCP in one to two weeks.     Allergies as of 07/03/2016   No Known Allergies     Medication List    STOP taking these medications   oxybutynin 5 MG tablet Commonly known as:  DITROPAN   senna-docusate 8.6-50 MG tablet Commonly known as:  Senokot-S   traMADol 50 MG tablet Commonly known as:  ULTRAM     TAKE these medications   albuterol 108 (90 Base) MCG/ACT inhaler Commonly known as:  PROVENTIL HFA;VENTOLIN HFA Inhale 2 puffs into the lungs every 6 (six) hours as needed for wheezing or shortness of breath.   amLODipine 2.5 MG tablet Commonly known as:  NORVASC Take 1 tablet (2.5 mg total) by mouth daily.   atenolol 50 MG tablet Commonly known as:  TENORMIN Take 50 mg by mouth every morning.   clonazePAM 2 MG tablet Commonly known as:  KLONOPIN Take 0.5 mg by mouth daily as needed for anxiety.   omeprazole 20 MG capsule Commonly known as:  PRILOSEC Take 20 mg by mouth as needed.      Follow-up Information    Tyler Pretty, MD. Schedule an appointment as soon as possible for a visit in 1 week(s).   Specialty:  Internal Medicine Contact information: [address] [place] [address] [phone]        Tyler Posner, MD. Schedule an appointment as soon as possible for a visit in 4 week(s).   Specialties:  Vascular Surgery, Cardiology Contact information: [place]  [address] [phone]        Department, [facility] Follow up in 2 day(s).   Why:  for AFB sputum samples and culture  Contact information: [address] [place] [address] [phone]        Tyler Miyamoto, MD Follow up in 2 week(s).   Specialty:  Pulmonary Disease Why:  for outpatient follow up of the lung lesion, for bronchoscopy and biopsies and follow up on the quantiferon gold test.  Contact information: [address] [place] [address] [phone]          No  Known Allergies  Consultations:  Cardiology.    Procedures/Studies: Dg Chest 2 View  Result Date: 07/02/2016 CLINICAL DATA:  Chest pain. EXAM: CHEST  2 VIEW COMPARISON:  Radiographs of Jun 17, 2013. FINDINGS: The heart size and mediastinal contours are within normal limits. Atherosclerosis of thoracic aorta is noted. No pneumothorax or pleural effusion is noted. Interval development of ill-defined opacity in left upper lobe concerning for possible pneumonia. Stable bibasilar scarring is noted. The visualized skeletal structures are unremarkable. IMPRESSION: Aortic atherosclerosis. Ill-defined left upper lobe opacity is noted concerning for possible pneumonia. Followup PA and lateral chest X-ray is recommended in 3-4 weeks following trial of antibiotic therapy to ensure resolution and exclude underlying malignancy. Electronically Signed   By: Marijo Conception, M.D.   On: 07/02/2016 10:41   Ct Angio Chest/abd/pel For Dissection W And/or Wo Contrast  Result Date: 07/02/2016 CLINICAL DATA:  Chest pain. Pain between shoulder blades. Lower extremity swelling. Sternal fracture. EXAM: CT ANGIOGRAPHY CHEST, ABDOMEN AND PELVIS TECHNIQUE: Multidetector CT imaging through the chest, abdomen and pelvis was performed using the standard protocol during bolus administration of intravenous contrast. Multiplanar reconstructed images and MIPs were obtained and reviewed to evaluate the vascular anatomy. CONTRAST:  100 mL Isovue 370 COMPARISON:  None. FINDINGS: CTA CHEST FINDINGS Cardiovascular: Noncontrast imaging demonstrates no intramural hematoma within the aorta. Contrast enhanced imaging demonstrates no dissection of the ascending, transverse or descending thoracic aorta. No aneurysm. Great vessels normal. Coronary artery calcification and aortic atherosclerotic calcification. No evidence of pulmonary embolism. Mediastinum/Nodes: No axillary or supraclavicular adenopathy. No mediastinal hilar adenopathy. No  pericardial fluid. Esophagus normal. Lungs/Pleura: Cavitary lesion in the LEFT upper lobe with thickened walls measures 3.9 x 2.5 cm. Wall thickness of 7 mm. No additional pulmonary nodules. Musculoskeletal: No aggressive osseous lesion. Review of the MIP images confirms the above findings. CTA ABDOMEN AND PELVIS FINDINGS VASCULAR Aorta: No evidence of dissection of the abdominal aorta. There is aneurysmal dilatation of the infrarenal abdominal aorta to 56 x 59 mm in axial dimension. On sagittal imaging maximum dimension of the saccular aneurysm is 56 mm. The aneurysm has increased in size from 38 mm on CT 08/31/2012. Iliac arteries are normal caliber with intimal calcification. IMA is patent. Celiac trunk and SMA are patent. Celiac: Patent SMA: Patent Renals: Single and patent.  Ostial calcification IMA: Patent Inflow: Normal Veins: Normal Review of the MIP images confirms the above findings. NON-VASCULAR Hepatobiliary: No focal hepatic lesion.  Small gallstone. Pancreas: Pancreas is normal. No ductal dilatation. No pancreatic inflammation. Spleen: Normal spleen Adrenals/Urinary Tract: Adrenal glands and kidneys are normal. Ureters and bladder normal. Portion of the bladder wall does enter a RIGHT inguinal hernia. Stomach/Bowel: Stomach, small bowel, appendix, and cecum are normal. The colon and rectosigmoid colon are normal. Lymphatic: No adenopathy Reproductive: Prostate normal. Other: Bilateral fat filled inguinal hernias. Musculoskeletal: No aggressive osseous lesion. Review of  the MIP images confirms the above findings. IMPRESSION: Chest Impression: 1. No evidence of thoracic aortic aneurysm or dissection. 2. Cavitary lesion in the LEFT upper lobe with thickened walls. Differential includes a pulmonary infection (consider fungal or mycobacterium infection) versus bronchogenic carcinoma (e.g. squamous cell carcinoma). Recommend bronchoscopy for further evaluation with sampling / microbiology. 3. No pulmonary  embolism. Abdomen / Pelvis Impression: 1. No aortic dissection. 2. Infrarenal saccular abdominal aortic aneurysm measuring up to 59 mm is increased in size from 2014. No evidence of acute inflammation or hemorrhage. Vascular surgery consultation recommended due to increased risk of rupture for AAA >5.5 cm. This recommendation follows ACR consensus guidelines: White Paper of the ACR Incidental Findings Committee II on Vascular Findings. J Am Coll Radiol 2013; 10:789-794. Electronically Signed   By: Suzy Bouchard M.D.   On: 07/02/2016 11:48     Subjective: No new complaints.   Discharge Exam: Vitals:   07/03/16 0046 07/03/16 0451  BP: (!) 152/82 (!) 141/75  Pulse: 91 85  Resp:  17  Temp:  99.1 F (37.3 C)   Vitals:   07/02/16 2300 07/02/16 2345 07/03/16 0046 07/03/16 0451  BP: 140/77 137/73 (!) 152/82 (!) 141/75  Pulse: 90 91 91 85  Resp:  18  17  Temp:  98.8 F (37.1 C)  99.1 F (37.3 C)  TempSrc:  Oral  Oral  SpO2:  95%  93%  Weight:      Height:        General: Pt is alert, awake, not in acute distress Cardiovascular: RRR, S1/S2 +, no rubs, no gallops Respiratory: CTA bilaterally, no wheezing, no rhonchi Abdominal: Soft, NT, ND, bowel sounds + Extremities: no edema, no cyanosis    The results of significant diagnostics from this hospitalization (including imaging, microbiology, ancillary and laboratory) are listed below for reference.     Microbiology: No results found for this or any previous visit (from the past 240 hour(s)).   Labs: BNP (last 3 results) No results for input(s): BNP in the last 8760 hours. Basic Metabolic Panel: No results for input(s): NA, K, CL, CO2, GLUCOSE, BUN, CREATININE, CALCIUM, MG, PHOS in the last 168 hours. Liver Function Tests: No results for input(s): AST, ALT, ALKPHOS, BILITOT, PROT, ALBUMIN in the last 168 hours. No results for input(s): LIPASE, AMYLASE in the last 168 hours. No results for input(s): AMMONIA in the last 168  hours. CBC: No results for input(s): WBC, NEUTROABS, HGB, HCT, MCV, PLT in the last 168 hours. Cardiac Enzymes: No results for input(s): CKTOTAL, CKMB, CKMBINDEX, TROPONINI in the last 168 hours. BNP: Invalid input(s): POCBNP CBG: No results for input(s): GLUCAP in the last 168 hours. D-Dimer No results for input(s): DDIMER in the last 72 hours. Hgb A1c No results for input(s): HGBA1C in the last 72 hours. Lipid Profile No results for input(s): CHOL, HDL, LDLCALC, TRIG, CHOLHDL, LDLDIRECT in the last 72 hours. Thyroid function studies No results for input(s): TSH, T4TOTAL, T3FREE, THYROIDAB in the last 72 hours.  Invalid input(s): FREET3 Anemia work up No results for input(s): VITAMINB12, FOLATE, FERRITIN, TIBC, IRON, RETICCTPCT in the last 72 hours. Urinalysis No results found for: COLORURINE, APPEARANCEUR, LABSPEC, [place], GLUCOSEU, HGBUR, BILIRUBINUR, KETONESUR, PROTEINUR, UROBILINOGEN, NITRITE, LEUKOCYTESUR Sepsis Labs Invalid input(s): PROCALCITONIN,  WBC,  LACTICIDVEN Microbiology No results found for this or any previous visit (from the past 240 hour(s)).   Time coordinating discharge: Over 30 minutes  SIGNED:   Hosie Poisson, MD  [facility] 07/12/2016, 11:26 PM Pager  If 7PM-7AM, please contact night-coverage [email or web address] Password [email or web address]

## 2016-07-13 ENCOUNTER — Encounter: Payer: Self-pay | Admitting: Pulmonary Disease

## 2016-07-13 ENCOUNTER — Ambulatory Visit (INDEPENDENT_AMBULATORY_CARE_PROVIDER_SITE_OTHER): Payer: Medicare Other | Admitting: Pulmonary Disease

## 2016-07-13 ENCOUNTER — Telehealth: Payer: Self-pay

## 2016-07-13 VITALS — BP 144/80 | HR 81 | Ht 70.0 in | Wt 211.8 lb

## 2016-07-13 DIAGNOSIS — J984 Other disorders of lung: Secondary | ICD-10-CM | POA: Diagnosis not present

## 2016-07-13 NOTE — Progress Notes (Signed)
Tyler Mcmillan    [id number]    1945-05-08  Primary Care Physician:Pharr, Thayer Jew, MD  Referring Physician: [name], [place] [address] [place], [place] [address]  Chief complaint:   Follow-up for cavitary lesion of the lung  HPI: 71 year old active smoker admitted to the hospital on 5/25 with left chest and back pain, associated with nonproductive cough. He had lung imaging which showed left upper lobe cavitary lesion. PCCM was consulted and recommended outpatient follow-up and consideration for bronchoscopy. He was evaluated by cardiology for atypical chest pain and had a cardiac catheterization which was unremarkable.  He continues to have cough without sputum. Denies any dyspnea, fevers, chills, hemoptysis. He is an active smoker and continues to smoke half pack per day. 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 There are no risk factors for TB.  Outpatient Encounter Prescriptions as of 07/13/2016  Medication Sig  . albuterol (PROVENTIL HFA;VENTOLIN HFA) 108 (90 BASE) MCG/ACT inhaler Inhale 2 puffs into the lungs every 6 (six) hours as needed for wheezing or shortness of breath.  [name] amLODipine (NORVASC) 2.5 MG tablet Take 1 tablet (2.5 mg total) by mouth daily.  [name] atenolol (TENORMIN) 50 MG tablet Take 50 mg by mouth every morning.  . clonazePAM (KLONOPIN) 2 MG tablet Take 0.5 mg by mouth daily as needed for anxiety.   . [DISCONTINUED] omeprazole (PRILOSEC) 20 MG capsule Take 20 mg by mouth as needed.    No facility-administered encounter medications on file as of 07/13/2016.     Allergies as of 07/13/2016  . (No Known Allergies)    Past Medical History:  Diagnosis Date  . AAA (abdominal aortic aneurysm) without rupture ([place])    [place] ABD.  DONE  JUNE 2014  3.5  . Bladder neoplasm   . Chronic anxiety   . GERD (gastroesophageal reflux disease)   . Hypertension     Past Surgical History:  Procedure Laterality Date  . CYSTOSCOPY W/ URETERAL  STENT PLACEMENT Left 10/04/2012   Procedure: CYSTOSCOPY WITH RETROGRADE PYELOGRAM/URETERAL STENT PLACEMENT   "POSSIBLE LEFT STENT";  Surgeon: Alexis Frock, MD;  Location: [facility];  Service: Urology;  Laterality: Left;  . LEFT HEART CATH AND CORONARY ANGIOGRAPHY N/A 07/02/2016   Procedure: Left Heart Cath and Coronary Angiography;  Surgeon: Martinique, Peter M, MD;  Location: [facility];  Service: Cardiovascular;  Laterality: N/A;  . MEATOTOMY N/A 10/04/2012   Procedure: MEATOTOMY ADULT;  Surgeon: Alexis Frock, MD;  Location[facility];  Service: Urology;  Laterality: N/A;  . TRANSURETHRAL RESECTION OF BLADDER TUMOR WITH GYRUS (TURBT-GYRUS) N/A 10/04/2012   Procedure: TRANSURETHRAL RESECTION OF BLADDER TUMOR WITH GYRUS (TURBT-GYRUS);  Surgeon: Alexis Frock, MD;  Location: [facility];  Service: Urology;  Laterality: N/A;    Family History  Problem Relation Age of Onset  . Cancer Mother   . Heart disease Father        before age 74  . Heart attack Father   . Cancer Brother     Social History   Social History  . Marital status: Married    Spouse name: N/A  . Number of children: N/A  . Years of education: N/A   Occupational History  . Not on file.   Social History Main Topics  . Smoking status: Current Every Day Smoker    Packs/day: 0.25    Years: 50.00    Types: Cigarettes  .  Smokeless tobacco: Never Used  . Alcohol use 14.4 oz/week    10 Glasses of wine, 14 Cans of beer per week     Comment: few glases wine per day  . Drug use: No  . Sexual activity: Not on file   Other Topics Concern  . Not on file   Social History Narrative  . No narrative on file    Review of systems: Review of Systems  Constitutional: Negative for fever and chills.  HENT: Negative.   Eyes: Negative for blurred vision.  Respiratory: as per HPI  Cardiovascular: Negative for chest pain and palpitations.  Gastrointestinal: Negative for  vomiting, diarrhea, blood per rectum. Genitourinary: Negative for dysuria, urgency, frequency and hematuria.  Musculoskeletal: Negative for myalgias, back pain and joint pain.  Skin: Negative for itching and rash.  Neurological: Negative for dizziness, tremors, focal weakness, seizures and loss of consciousness.  Endo/Heme/Allergies: Negative for environmental allergies.  Psychiatric/Behavioral: Negative for depression, suicidal ideas and hallucinations.  All other systems reviewed and are negative.  Physical Exam: Blood pressure (!) 144/80, pulse 81, height 5\' 10"  (1.778 m), weight 211 lb 12.8 oz (96.1 kg), SpO2 93 %. Gen:      No acute distress HEENT:  EOMI, sclera anicteric Neck:     No masses; no thyromegaly Lungs:    Clear to auscultation bilaterally; normal respiratory effort CV:         Regular rate and rhythm; no murmurs Abd:      + bowel sounds; soft, non-tender; no palpable masses, no distension Ext:    No edema; adequate peripheral perfusion Skin:      Warm and dry; no rash Neuro: alert and oriented x 3 Psych: normal mood and affect  Data Reviewed: CT chest 07/02/16-cavitary left upper lobe lesion. No pulmonary embolism. I had reviewed all images personally.  QuantiFERON 07/02/16-negative  Assessment:  Left upper lobe cavitary lesion Differential diagnoses include infections such as mycobacterial, fungal or malignancy. He does not appear particularly toxic or symptomatic to suggest a bacterial process.  I'll check a beta D glucan and schedule for bronchoscopy at [facility] next week. We'll contact the patient with the date and time. All risks and benefits of the procedure discussed with the patient and he is agreeable to proceed.  Plan/Recommendations: - Check PT/INR, beta D glucan - Schedule for bronchoscopy  Marshell Garfinkel MD [facility] Pager [phone] 07/13/2016, 4:58 PM  CC: [name], MD

## 2016-07-13 NOTE — Patient Instructions (Signed)
We'll call you with the dates the bronchoscopy He will need labs before the procedure

## 2016-07-14 ENCOUNTER — Telehealth: Payer: Self-pay

## 2016-07-14 ENCOUNTER — Other Ambulatory Visit: Payer: Self-pay

## 2016-07-14 DIAGNOSIS — J984 Other disorders of lung: Secondary | ICD-10-CM

## 2016-07-14 NOTE — Telephone Encounter (Signed)
Patient returning call - pt can be reached at [phone]

## 2016-07-14 NOTE — Progress Notes (Signed)
Spoke with patient and informed him of results. He verbalized understanding. Nothing further is needed.

## 2016-07-14 NOTE — Telephone Encounter (Addendum)
lmtcb x1 to give pt date/time of scheduled bronch for 07/19/16 @ 10:00 @ [facility]. Pt needs labs prior. Labs have been ordered.

## 2016-07-14 NOTE — Telephone Encounter (Signed)
Left message for patient to call back  

## 2016-07-15 NOTE — Telephone Encounter (Signed)
Pt returning call and can be reached @ same #.Tyler Mcmillan

## 2016-07-16 NOTE — Telephone Encounter (Signed)
Patient is returning call, CB is [phone] or [phone].  Patient's bronch is scheduled for Monday.

## 2016-07-16 NOTE — Telephone Encounter (Signed)
Pt returned phone call would like to know more about Broncho appt scheduled for 6/11..the patient contact #  [phone].Marland Kitchenert

## 2016-07-16 NOTE — Telephone Encounter (Signed)
I have left messages on both numbers that were left by the pt. Await his call back.

## 2016-07-16 NOTE — Telephone Encounter (Signed)
lmtcb x1 for pt. 

## 2016-07-19 ENCOUNTER — Ambulatory Visit (HOSPITAL_COMMUNITY)
Admission: RE | Admit: 2016-07-19 | Discharge: 2016-07-19 | Disposition: A | Discharge status: Home / Self Care | Payer: Medicare Other | Source: Ambulatory Visit | Attending: Pulmonary Disease | Admitting: Pulmonary Disease

## 2016-07-19 ENCOUNTER — Encounter (HOSPITAL_COMMUNITY)
Admission: RE | Disposition: A | Discharge status: Home / Self Care | Payer: Self-pay | Source: Ambulatory Visit | Attending: Pulmonary Disease

## 2016-07-19 ENCOUNTER — Encounter (HOSPITAL_COMMUNITY): Payer: Self-pay | Admitting: Respiratory Therapy

## 2016-07-19 ENCOUNTER — Other Ambulatory Visit: Payer: Self-pay

## 2016-07-19 ENCOUNTER — Ambulatory Visit (HOSPITAL_COMMUNITY): Payer: Medicare Other

## 2016-07-19 ENCOUNTER — Ambulatory Visit (HOSPITAL_BASED_OUTPATIENT_CLINIC_OR_DEPARTMENT_OTHER)
Admission: RE | Admit: 2016-07-19 | Discharge: 2016-07-19 | Disposition: A | Discharge status: Home / Self Care | Payer: Medicare Other | Source: Ambulatory Visit | Attending: Pulmonary Disease | Admitting: Pulmonary Disease

## 2016-07-19 DIAGNOSIS — C3412 Malignant neoplasm of upper lobe, left bronchus or lung: Secondary | ICD-10-CM | POA: Diagnosis not present

## 2016-07-19 DIAGNOSIS — F419 Anxiety disorder, unspecified: Secondary | ICD-10-CM | POA: Diagnosis not present

## 2016-07-19 DIAGNOSIS — J984 Other disorders of lung: Secondary | ICD-10-CM

## 2016-07-19 DIAGNOSIS — J9811 Atelectasis: Secondary | ICD-10-CM | POA: Diagnosis not present

## 2016-07-19 DIAGNOSIS — Z79899 Other long term (current) drug therapy: Secondary | ICD-10-CM | POA: Diagnosis not present

## 2016-07-19 DIAGNOSIS — C3492 Malignant neoplasm of unspecified part of left bronchus or lung: Secondary | ICD-10-CM | POA: Diagnosis not present

## 2016-07-19 DIAGNOSIS — R911 Solitary pulmonary nodule: Secondary | ICD-10-CM | POA: Diagnosis present

## 2016-07-19 DIAGNOSIS — Z9889 Other specified postprocedural states: Secondary | ICD-10-CM

## 2016-07-19 DIAGNOSIS — F17210 Nicotine dependence, cigarettes, uncomplicated: Secondary | ICD-10-CM | POA: Insufficient documentation

## 2016-07-19 DIAGNOSIS — R848 Other abnormal findings in specimens from respiratory organs and thorax: Secondary | ICD-10-CM | POA: Diagnosis not present

## 2016-07-19 DIAGNOSIS — I10 Essential (primary) hypertension: Secondary | ICD-10-CM | POA: Diagnosis not present

## 2016-07-19 DIAGNOSIS — C3490 Malignant neoplasm of unspecified part of unspecified bronchus or lung: Secondary | ICD-10-CM | POA: Diagnosis not present

## 2016-07-19 HISTORY — PX: VIDEO BRONCHOSCOPY: SHX5072

## 2016-07-19 LAB — PNEUMOCYSTIS JIROVECI SMEAR BY DFA: PNEUMOCYSTIS JIROVECI AG: NEGATIVE

## 2016-07-19 LAB — BODY FLUID CELL COUNT WITH DIFFERENTIAL
EOS FL: 4 %
LYMPHS FL: 18 %
MONOCYTE-MACROPHAGE-SEROUS FLUID: 17 % — AB (ref 50–90)
Neutrophil Count, Fluid: 61 % — ABNORMAL HIGH (ref 0–25)
Total Nucleated Cell Count, Fluid: 13 cu mm (ref 0–1000)

## 2016-07-19 SURGERY — BRONCHOSCOPY, WITH FLUOROSCOPY
Anesthesia: Moderate Sedation | Laterality: Bilateral

## 2016-07-19 MED ORDER — PHENYLEPHRINE HCL 0.25 % NA SOLN
NASAL | Status: DC | PRN
Start: 2016-07-19 — End: 2016-07-19
  Administered 2016-07-19: 2 via NASAL

## 2016-07-19 MED ORDER — MIDAZOLAM HCL 5 MG/ML IJ SOLN
INTRAMUSCULAR | Status: AC
  Filled 2016-07-19: qty 2

## 2016-07-19 MED ORDER — FENTANYL CITRATE (PF) 100 MCG/2ML IJ SOLN
INTRAMUSCULAR | Status: AC
  Filled 2016-07-19: qty 4

## 2016-07-19 MED ORDER — LEVOFLOXACIN 750 MG PO TABS: 750.0000 mg | ORAL_TABLET | Freq: Every day | ORAL | 0 refills | Status: AC

## 2016-07-19 MED ORDER — LIDOCAINE HCL 2 % EX GEL: 1.0000 "application " | Freq: Once | CUTANEOUS | Status: DC

## 2016-07-19 MED ORDER — MIDAZOLAM HCL 10 MG/2ML IJ SOLN
INTRAMUSCULAR | Status: DC | PRN
Start: 2016-07-19 — End: 2016-07-19
  Administered 2016-07-19 (×4): 1 mg via INTRAVENOUS

## 2016-07-19 MED ORDER — BUTAMBEN-TETRACAINE-BENZOCAINE 2-2-14 % EX AERO: 1.0000 | INHALATION_SPRAY | Freq: Once | CUTANEOUS | Status: DC

## 2016-07-19 MED ORDER — SODIUM CHLORIDE 0.9 % IV SOLN
INTRAVENOUS | Status: DC
  Administered 2016-07-19: 10:00:00 via INTRAVENOUS

## 2016-07-19 MED ORDER — LIDOCAINE HCL 1 % IJ SOLN
INTRAMUSCULAR | Status: DC | PRN
  Administered 2016-07-19: 6 mg

## 2016-07-19 MED ORDER — PHENYLEPHRINE HCL 0.25 % NA SOLN: 1.0000 | Freq: Four times a day (QID) | NASAL | Status: DC | PRN

## 2016-07-19 MED ORDER — FENTANYL CITRATE (PF) 100 MCG/2ML IJ SOLN
INTRAMUSCULAR | Status: DC | PRN
Start: 2016-07-19 — End: 2016-07-19
  Administered 2016-07-19 (×4): 25 ug via INTRAVENOUS

## 2016-07-19 MED ORDER — LIDOCAINE HCL 2 % EX GEL
CUTANEOUS | Status: DC | PRN
  Administered 2016-07-19: 1

## 2016-07-19 NOTE — Progress Notes (Signed)
Video Bronchoscopy done  Intervention bronchial biopsy Intervention Bronchial washing Intervention Bronchial brushing  Procedure tolerated well

## 2016-07-19 NOTE — Progress Notes (Signed)
Bronchoscopy note:   Pt given sedation for bronchoscpy  10:06    1 versed and 25 fentanly 10:09    1 versed and 25 fentanlly 10:13    1 versed and 25 fentanly 10:18    1 versed and 25 fentanly; 10:22    2 versed and 50 fentanly;  10:27    1 versed and 25 fentanly 10:30    1 versed 10:39    1 versed 10:49    25  fentanly Total of 9 versed and 200 fentanly

## 2016-07-19 NOTE — Op Note (Signed)
[facility] Cardiopulmonary Patient Name: Tyler Mcmillan Procedure Date: 07/19/2016 MRN: [id number] Attending MD: Marshell Garfinkel , MD Date of Birth: [birth year] CSN: [id number] Age: 71 Admit Type: Outpatient Ethnicity: Unknown Procedure:            Bronchoscopy Indications:          Left upper lobe cavitary lesion Providers:            Marshell Garfinkel, MD, Andre Lefort RRT,RCP, Ashley Mariner RRT,RCP Referring MD:          Medicines:            Midazolam 9 mg IV, Fentanyl 573 mcg IV Complications:        No immediate complications Estimated Blood Loss: Estimated blood loss: none. Procedure:      Pre-Anesthesia Assessment:      - A History and Physical has been performed. Patient meds and allergies       have been reviewed. The risks and benefits of the procedure and the       sedation options and risks were discussed with the patient. All       questions were answered and informed consent was obtained. Patient       identification and proposed procedure were verified prior to the       procedure by the physician in the procedure room. Mental Status       Examination: normal. Airway Examination: normal oropharyngeal airway.       Respiratory Examination: clear to auscultation. CV Examination: RRR, no       murmurs, no S3 or S4. ASA Grade Assessment: II - A patient with mild       systemic disease. After reviewing the risks and benefits, the patient       was deemed in satisfactory condition to undergo the procedure. The       anesthesia plan was to use moderate sedation / analgesia (conscious       sedation). Immediately prior to administration of medications, the       patient was re-assessed for adequacy to receive sedatives. The heart       rate, respiratory rate, oxygen saturations, blood pressure, adequacy of       pulmonary ventilation, and response to care were monitored throughout       the procedure. The physical status of the patient  was re-assessed after       the procedure.      After obtaining informed consent, the bronchoscope was passed under       direct vision. Throughout the procedure, the patient's blood pressure,       pulse, and oxygen saturations were monitored continuously. the [id number]       [id number] scope was introduced through the right nostril and advanced to       the tracheobronchial tree. Findings:      Left Lung Abnormalities: A small non-obstructing submucosal and raised       lesion was found proximally, at the take off to left upper lobe and       lingula (picture). BAL was performed in the LUL anterior segment (B3) of       the lung and sent for cell count, bacterial culture, viral smears &       culture, and fungal & AFB analysis and cytology. 180 mL of fluid were  instilled. 80 mL were returned. The return was cloudy. There were no       mucoid plugs in the return fluid. Multiple specimens were obtained and       pooled into one specimen, which was sent for analysis.      Brushings were obtained in the left upper lobe musocal lesion and sent       for routine cytology. Two samples were obtained. Fluoroscopically guided       transbronchial brushings were obtained in the anterior segment of the       left upper lobe and sent for routine cytology. Four samples were       obtained. Transbronchial biopsies were performed in the anterior segment       of the left upper lobe using alligator forceps and sent for       histopathology examination. The procedure was guided by fluoroscopy.       Transbronchial biopsy technique was selected because the sampling site       was not visible endoscopically. Three biopsy passes were performed.       Three biopsy samples were obtained. Impression:      - Left upper lobe cavitary lesion      - A mucosal lesion was found in the left upper lobe.      - Bronchoalveolar lavage was performed.      - Brushings were obtained.      - Fluoroscopically guided  transbronchial brushings were obtained.      - Transbronchial lung biopsies were performed. Moderate Sedation:      Moderate (conscious) sedation was administered by the endoscopy nurse       and supervised by the endoscopist. The following parameters were       monitored: oxygen saturation, heart rate, blood pressure, respiratory       rate, EKG, adequacy of pulmonary ventilation, and response to care.       Total physician intraservice time was 30 minutes. Recommendation:      - Follow up in clinic in 3-4 weeks.      - Start levaquin 750 mg daily for 14 days Procedure Code(s):      --- Professional ---      [phone], Bronchoscopy, rigid or flexible, including fluoroscopic guidance,       when performed; with transbronchial lung biopsy(s), single lobe      31624, Bronchoscopy, rigid or flexible, including fluoroscopic guidance,       when performed; with bronchial alveolar lavage      31623, Bronchoscopy, rigid or flexible, including fluoroscopic guidance,       when performed; with brushing or protected brushings      99152, Moderate sedation services provided by the same physician or       other qualified health care professional performing the diagnostic or       therapeutic service that the sedation supports, requiring the presence       of an independent trained observer to assist in the monitoring of the       patient's level of consciousness and physiological status; initial 15       minutes of intraservice time, patient age 30 years or older      [phone], Moderate sedation services; each additional 15 minutes       intraservice time Diagnosis Code(s):      --- Professional ---      R91.8, Other nonspecific abnormal finding of lung field CPT copyright 2016 American Medical  Association. All rights reserved. The codes documented in this report are preliminary and upon coder review may  be revised to meet current compliance requirements. Marshell Garfinkel, MD 07/19/2016 11:16:30  AM Number of Addenda: 0 Scope In: 10:25:52 AM Scope Out: 10:53:12 AM

## 2016-07-19 NOTE — Discharge Instructions (Signed)
Flexible Bronchoscopy, Care After These instructions give you information on caring for yourself after your procedure. Your doctor may also give you more specific instructions. Call your doctor if you have any problems or questions after your procedure. Follow these instructions at home:  Do not eat or drink anything for 2 hours after your procedure. If you try to eat or drink before the medicine wears off, food or drink could go into your lungs. You could also burn yourself.  After 2 hours have passed and when you can cough and gag normally, you may eat soft food and drink liquids slowly.  The day after the test, you may eat your normal diet.  You may do your normal activities.  Keep all doctor visits. Get help right away if:  You get more and more short of breath.  You get light-headed.  You feel like you are going to pass out (faint).  You have chest pain.  You have new problems that worry you.  You cough up more than a little blood.  You cough up more blood than before. This information is not intended to replace advice given to you by your health care provider. Make sure you discuss any questions you have with your health care provider. Document Released: 11/22/2008 Document Revised: 07/03/2015 Document Reviewed: 09/29/2012 Elsevier Interactive Patient Education  2017 [place].  Nothing to eat or drink until 12:30   pm   today 07/19/2016

## 2016-07-20 ENCOUNTER — Encounter (HOSPITAL_COMMUNITY): Payer: Self-pay | Admitting: Pulmonary Disease

## 2016-07-20 LAB — ACID FAST SMEAR (AFB, MYCOBACTERIA): Acid Fast Smear: NEGATIVE

## 2016-07-20 LAB — ACID FAST SMEAR (AFB)

## 2016-07-21 LAB — CULTURE, BAL-QUANTITATIVE W GRAM STAIN

## 2016-07-21 LAB — CULTURE, BAL-QUANTITATIVE
CULTURE: NORMAL — AB
SPECIAL REQUESTS: NORMAL

## 2016-07-23 ENCOUNTER — Telehealth: Payer: Self-pay | Admitting: Pulmonary Disease

## 2016-07-23 DIAGNOSIS — C3490 Malignant neoplasm of unspecified part of unspecified bronchus or lung: Secondary | ICD-10-CM

## 2016-07-23 DIAGNOSIS — F17200 Nicotine dependence, unspecified, uncomplicated: Secondary | ICD-10-CM

## 2016-07-23 DIAGNOSIS — R911 Solitary pulmonary nodule: Secondary | ICD-10-CM

## 2016-07-23 NOTE — Telephone Encounter (Signed)
Spoke with the pt  He states having some ankle pain and swelling since starting on levaquin- I advised due to s/e of tendon problems with this med, to not take this again until he hears from [place] He also requests bronch results- they are in the system  Please advise ASAP, thanks!  No Known Allergies

## 2016-07-23 NOTE — Telephone Encounter (Signed)
Pt has been scheduled for PFT at 4:00 on 07/30/16. PET and oncology ref have been placed. Pt is aware and voiced his understanding. Nothing further needed.

## 2016-07-23 NOTE — Telephone Encounter (Signed)
I called and spoke with the patient regarding the results. 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 Please order PET scan for staging, PFTs and refer to oncology.  All cultures are negative to date. I told him it is ok to stop the antibiotics and monitor leg swelling.   Marshell Garfinkel MD

## 2016-07-26 ENCOUNTER — Telehealth: Payer: Self-pay | Admitting: Pulmonary Disease

## 2016-07-26 ENCOUNTER — Telehealth: Payer: Self-pay | Admitting: *Deleted

## 2016-07-26 DIAGNOSIS — J984 Other disorders of lung: Secondary | ICD-10-CM

## 2016-07-26 NOTE — Telephone Encounter (Signed)
lmtcb x1 for pt. 

## 2016-07-26 NOTE — Telephone Encounter (Signed)
That is OK  Tyler Garfinkel MD

## 2016-07-26 NOTE — Telephone Encounter (Signed)
PFT has already been cancelled.  Will close encounter.

## 2016-07-26 NOTE — Telephone Encounter (Signed)
Pt returned phone call..ert ° ° °

## 2016-07-26 NOTE — Telephone Encounter (Signed)
Spoke with pt, who states due to multiple appt between he and his wife he would like to cancel scheduled PFT. Pt states he will reschedule if needed after meeting with Dr. [name] on 08/05/16.  Will route to PM as a FYI. Thanks.

## 2016-07-26 NOTE — Telephone Encounter (Signed)
Oncology Nurse Navigator Documentation  Oncology Nurse Navigator Flowsheets 07/26/2016  Navigator Location [facility]  Referral date to RadOnc/MedOnc 07/26/2016  Navigator Encounter Type Telephone/I received referral on Tyler Mcmillan today.  I called him with an appt to see Dr. [name] on 08/05/16 with labs at 1:30 and Dr. [name] at 2:00.  He verbalized understanding of appt time and place.   Telephone Outgoing Call  Treatment Phase Pre-Tx/Tx Discussion  Barriers/Navigation Needs Coordination of Care  Interventions Coordination of Care  Coordination of Care Appts  Acuity Level 2  Time Spent with Patient 15

## 2016-07-29 ENCOUNTER — Telehealth: Payer: Self-pay | Admitting: *Deleted

## 2016-07-29 ENCOUNTER — Other Ambulatory Visit: Payer: Self-pay | Admitting: *Deleted

## 2016-07-29 NOTE — Telephone Encounter (Signed)
Oncology Nurse Navigator Documentation  Oncology Nurse Navigator Flowsheets 07/29/2016  Navigator Location [facility]  Navigator Encounter Type Telephone/per Dr. [name] I called and updated patient on appt change.  He verbalized understanding of appt change.    Telephone Outgoing Call  Treatment Phase Pre-Tx/Tx Discussion  Barriers/Navigation Needs Coordination of Care  Interventions Coordination of Care  Coordination of Care Appts  Acuity Level 1  Time Spent with Patient 15

## 2016-08-03 ENCOUNTER — Encounter (HOSPITAL_COMMUNITY)
Admission: RE | Admit: 2016-08-03 | Discharge: 2016-08-03 | Disposition: A | Discharge status: Home / Self Care | Payer: Medicare Other | Source: Ambulatory Visit | Attending: Pulmonary Disease | Admitting: Pulmonary Disease

## 2016-08-03 ENCOUNTER — Ambulatory Visit (HOSPITAL_COMMUNITY): Payer: Medicare Other

## 2016-08-03 DIAGNOSIS — R911 Solitary pulmonary nodule: Secondary | ICD-10-CM | POA: Diagnosis not present

## 2016-08-03 LAB — GLUCOSE, CAPILLARY: Glucose-Capillary: 146 mg/dL — ABNORMAL HIGH (ref 65–99)

## 2016-08-03 MED ORDER — FLUDEOXYGLUCOSE F - 18 (FDG) INJECTION
10.0000 | Freq: Once | INTRAVENOUS | Status: AC | PRN
  Administered 2016-08-03: 10 via INTRAVENOUS

## 2016-08-05 ENCOUNTER — Other Ambulatory Visit: Payer: Medicare Other

## 2016-08-05 ENCOUNTER — Ambulatory Visit: Payer: Medicare Other | Admitting: Internal Medicine

## 2016-08-09 ENCOUNTER — Institutional Professional Consult (permissible substitution): Payer: Medicare Other | Admitting: Internal Medicine

## 2016-08-09 ENCOUNTER — Other Ambulatory Visit (HOSPITAL_BASED_OUTPATIENT_CLINIC_OR_DEPARTMENT_OTHER): Payer: Medicare Other

## 2016-08-09 ENCOUNTER — Telehealth: Payer: Self-pay | Admitting: Internal Medicine

## 2016-08-09 ENCOUNTER — Encounter: Payer: Self-pay | Admitting: Internal Medicine

## 2016-08-09 ENCOUNTER — Ambulatory Visit (HOSPITAL_BASED_OUTPATIENT_CLINIC_OR_DEPARTMENT_OTHER): Payer: Medicare Other | Admitting: Internal Medicine

## 2016-08-09 DIAGNOSIS — C3412 Malignant neoplasm of upper lobe, left bronchus or lung: Secondary | ICD-10-CM

## 2016-08-09 DIAGNOSIS — I714 Abdominal aortic aneurysm, without rupture: Secondary | ICD-10-CM | POA: Diagnosis not present

## 2016-08-09 DIAGNOSIS — G893 Neoplasm related pain (acute) (chronic): Secondary | ICD-10-CM | POA: Diagnosis not present

## 2016-08-09 DIAGNOSIS — Z801 Family history of malignant neoplasm of trachea, bronchus and lung: Secondary | ICD-10-CM

## 2016-08-09 DIAGNOSIS — J984 Other disorders of lung: Secondary | ICD-10-CM

## 2016-08-09 DIAGNOSIS — C3492 Malignant neoplasm of unspecified part of left bronchus or lung: Secondary | ICD-10-CM

## 2016-08-09 DIAGNOSIS — Z8052 Family history of malignant neoplasm of bladder: Secondary | ICD-10-CM

## 2016-08-09 DIAGNOSIS — F17218 Nicotine dependence, cigarettes, with other nicotine-induced disorders: Secondary | ICD-10-CM

## 2016-08-09 DIAGNOSIS — J449 Chronic obstructive pulmonary disease, unspecified: Secondary | ICD-10-CM | POA: Diagnosis not present

## 2016-08-09 HISTORY — DX: Malignant neoplasm of unspecified part of left bronchus or lung: C34.92

## 2016-08-09 LAB — CBC WITH DIFFERENTIAL/PLATELET
BASO%: 0.9 % (ref 0.0–2.0)
BASOS ABS: 0.1 10*3/uL (ref 0.0–0.1)
EOS%: 3 % (ref 0.0–7.0)
Eosinophils Absolute: 0.2 10*3/uL (ref 0.0–0.5)
HEMATOCRIT: 47.1 % (ref 38.4–49.9)
HGB: 16 g/dL (ref 13.0–17.1)
LYMPH#: 0.8 10*3/uL — AB (ref 0.9–3.3)
LYMPH%: 12.2 % — AB (ref 14.0–49.0)
MCH: 35.8 pg — AB (ref 27.2–33.4)
MCHC: 34 g/dL (ref 32.0–36.0)
MCV: 105.3 fL — ABNORMAL HIGH (ref 79.3–98.0)
MONO#: 0.6 10*3/uL (ref 0.1–0.9)
MONO%: 8.9 % (ref 0.0–14.0)
NEUT#: 4.9 10*3/uL (ref 1.5–6.5)
NEUT%: 75 % (ref 39.0–75.0)
PLATELETS: 233 10*3/uL (ref 140–400)
RBC: 4.47 10*6/uL (ref 4.20–5.82)
RDW: 14.4 % (ref 11.0–14.6)
WBC: 6.6 10*3/uL (ref 4.0–10.3)

## 2016-08-09 LAB — COMPREHENSIVE METABOLIC PANEL
ALT: 45 U/L (ref 0–55)
ANION GAP: 9 meq/L (ref 3–11)
AST: 85 U/L — AB (ref 5–34)
Albumin: 3.4 g/dL — ABNORMAL LOW (ref 3.5–5.0)
Alkaline Phosphatase: 105 U/L (ref 40–150)
BUN: 3.9 mg/dL — ABNORMAL LOW (ref 7.0–26.0)
CALCIUM: 9.6 mg/dL (ref 8.4–10.4)
CHLORIDE: 97 meq/L — AB (ref 98–109)
CO2: 27 meq/L (ref 22–29)
Creatinine: 0.8 mg/dL (ref 0.7–1.3)
EGFR: >90 mL/min/{1.73_m2} (ref >90)
Glucose: 106 mg/dl (ref 70–140)
POTASSIUM: 4.7 meq/L (ref 3.5–5.1)
Sodium: 133 mEq/L — ABNORMAL LOW (ref 136–145)
Total Bilirubin: 1.04 mg/dL (ref 0.20–1.20)
Total Protein: 7.9 g/dL (ref 6.4–8.3)

## 2016-08-09 NOTE — Progress Notes (Signed)
[facility] [phone]   Fax:([phone]  CONSULT NOTE  REFERRING PHYSICIAN: Dr. Marshell Garfinkel  REASON FOR CONSULTATION:  71 years old white male recently diagnosed with lung cancer.  HPI Tyler Mcmillan is a 71 y.o. male was past medical history significant for anxiety, history of bladder cancer status post local resection under the care of Dr. [name], hypertension, GERD and long history of smoking. The patient mentions that he was complaining of chest discomfort with radiation to the upper back. He was initially evaluated at the emergency department for cardiac condition. During his evaluation chest x-ray was performed on 07/02/2016 and it showed ill-defined left upper lobe opacity concerning for possible pneumonia. This was followed by CT angiogram of the chest on 07/02/2016 and it showed cavitary lesion in the left upper lobe with thickened wall measured 3.9 x 2.5 cm. The wall thickness was 7 mm. The scan also showed infrarenal second or abdominal aortic aneurysm measuring 5.9 cm increased in size from 2014. He was evaluated at that time by Dr. Donnetta Hutching. The patient was referred to Dr. Vaughan Browner and on 07/19/2016, he underwent a video bronchoscopy with biopsy of the left upper lobe lung cavitary lesion. 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 A PET scan on 08/03/2016 showed 3.8 cm left upper lobe cavitary lesion with second RAM of soft tissue and less central cavitation when compared to the most recent CT scan. The lesion was hypermetabolic with SUV max of 76.7 cm and concerning for squamous cell carcinoma. There was no enlarged or hypermetabolic mediastinal lymph nodes to suggest metastatic adenopathy and there was no evidence for metastatic disease in the neck, abdomen or pelvis. The patient was referred to me today for evaluation and recommendation regarding treatment of his condition. When seen today  he continues to complain of the chronic back pain as well as pain left axilla. He also has intermittent headache. He denied having any chest pain but continues to have dry cough with no shortness breath or hemoptysis. He denied having any weight loss or night sweats. He has no nausea, vomiting, diarrhea or constipation. Family history significant for a mother with bladder cancer, father had heart attack and congestive heart failure and brother had lung cancer. The patient is married and has 2 children. He was accompanied today by his wife Tyler Mcmillan. He has a history of smoking 1 pack per day for over 50 years and unfortunately he continues to smoke. He has a history of alcohol abuse but not recently and no history of drug abuse.  HPI  Past Medical History:  Diagnosis Date  . AAA (abdominal aortic aneurysm) without rupture ([place])    [place] ABD.  DONE  JUNE 2014  3.5  . Bladder neoplasm   . Chronic anxiety   . GERD (gastroesophageal reflux disease)   . Hypertension   . 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 CYSTOSCOPY W/ URETERAL STENT PLACEMENT Left 10/04/2012   Procedure: CYSTOSCOPY WITH RETROGRADE PYELOGRAM/URETERAL STENT PLACEMENT   "POSSIBLE LEFT STENT";  Surgeon: Alexis Frock, MD;  Location: [facility];  Service: Urology;  Laterality: Left;  . LEFT HEART CATH AND CORONARY ANGIOGRAPHY N/A 07/02/2016   Procedure: Left Heart Cath and Coronary Angiography;  Surgeon: Martinique, Peter M, MD;  Location: [facility];  Service: Cardiovascular;  Laterality: N/A;  . MEATOTOMY N/A 10/04/2012  Procedure: MEATOTOMY ADULT;  Surgeon: Alexis Frock, MD;  Location: [facility];  Service: Urology;  Laterality: N/A;  . TRANSURETHRAL RESECTION OF BLADDER TUMOR WITH GYRUS (TURBT-GYRUS) N/A 10/04/2012   Procedure: TRANSURETHRAL RESECTION OF BLADDER TUMOR WITH GYRUS (TURBT-GYRUS);  Surgeon: Alexis Frock,  MD;  Location: [facility];  Service: Urology;  Laterality: N/A;  . VIDEO BRONCHOSCOPY Bilateral 07/19/2016   Procedure: VIDEO BRONCHOSCOPY WITH FLUORO;  Surgeon: Marshell Garfinkel, MD;  Location: [facility];  Service: Cardiopulmonary;  Laterality: Bilateral;    Family History  Problem Relation Age of Onset  . Cancer Mother   . Heart disease Father        before age 45  . Heart attack Father   . Cancer Brother     Social History Social History  Substance Use Topics  . Smoking status: Current Every Day Smoker    Packs/day: 0.50    Years: 50.00    Types: Cigarettes  . Smokeless tobacco: Never Used  . Alcohol use 14.4 oz/week    10 Glasses of wine, 14 Cans of beer per week     Comment: few glases wine per day, beer    No Known Allergies  Current Outpatient Prescriptions  Medication Sig Dispense Refill  . amLODipine (NORVASC) 2.5 MG tablet Take 1 tablet (2.5 mg total) by mouth daily. 30 tablet 0  . atenolol (TENORMIN) 50 MG tablet Take 50 mg by mouth daily.     . clonazePAM (KLONOPIN) 0.5 MG tablet Take 0.5 mg by mouth 2 (two) times daily as needed for anxiety.    [name] ibuprofen (ADVIL,MOTRIN) 200 MG tablet Take 400 mg by mouth daily as needed for mild pain.     No current facility-administered medications for this visit.     Review of Systems  Constitutional: positive for fatigue Eyes: negative Ears, nose, mouth, throat, and face: negative Respiratory: positive for cough and dyspnea on exertion Cardiovascular: negative Gastrointestinal: negative Genitourinary:negative Integument/breast: negative Hematologic/lymphatic: negative Musculoskeletal:positive for back pain Neurological: negative Behavioral/Psych: negative Endocrine: negative Allergic/Immunologic: negative  Physical Exam  ZOX:WRUEA, healthy, no distress, well nourished, well developed and anxious SKIN: skin color, texture, turgor are normal, no rashes or significant lesions HEAD:  Normocephalic, No masses, lesions, tenderness or abnormalities EYES: normal, PERRLA EARS: External ears normal, Canals clear OROPHARYNX:no exudate, no erythema and lips, buccal mucosa, and tongue normal  NECK: supple, no adenopathy, no JVD LYMPH:  no palpable lymphadenopathy, no hepatosplenomegaly LUNGS: clear to auscultation , and palpation HEART: regular rate & rhythm, no murmurs and no gallops ABDOMEN:abdomen soft, non-tender, normal bowel sounds and no masses or organomegaly BACK: Back symmetric, no curvature., No CVA tenderness, Range of motion is normal EXTREMITIES:no joint deformities, effusion, or inflammation, no edema, no skin discoloration  NEURO: alert & oriented x 3 with fluent speech, no focal motor/sensory deficits  PERFORMANCE STATUS: ECOG 1  LABORATORY DATA: Lab Results  Component Value Date   WBC 6.6 08/09/2016   HGB 16.0 08/09/2016   HCT 47.1 08/09/2016   MCV 105.3 (H) 08/09/2016   PLT 233 08/09/2016      Chemistry      Component Value Date/Time   NA 133 (L) 08/09/2016 1520   K 4.7 08/09/2016 1520   CL 97 (L) 07/02/2016 0958   CO2 27 08/09/2016 1520   BUN 3.9 (L) 08/09/2016 1520   CREATININE 0.8 08/09/2016 1520      Component Value Date/Time   CALCIUM 9.6 08/09/2016 1520   ALKPHOS 105 08/09/2016 1520  AST 85 (H) 08/09/2016 1520   ALT 45 08/09/2016 1520   BILITOT 1.04 08/09/2016 1520       RADIOGRAPHIC STUDIES: Nm Pet Image Initial (pi) Skull Base To Thigh  Result Date: 08/03/2016 CLINICAL DATA:  Initial treatment strategy for cavitary lung lesion. EXAM: NUCLEAR MEDICINE PET SKULL BASE TO THIGH TECHNIQUE: 10.0 mCi F-18 FDG was injected intravenously. Full-ring PET imaging was performed from the skull base to thigh after the radiotracer. CT data was obtained and used for attenuation correction and anatomic localization. FASTING BLOOD GLUCOSE:  Value: 146 mg/dl COMPARISON:  CT chest 07/02/2016 FINDINGS: NECK No hypermetabolic lymph nodes in the neck.  CHEST 3.8 cm left upper lobe cavitary lesion demonstrates a thicker ram of soft tissue and less central cavitation when compared to the chest CT from 1 month ago. The lesion is hypermetabolic with SUV max of 42.7 and is concerning for squamous cell neoplasm. No enlarged or hypermetabolic mediastinal/hilar lymph nodes to suggest metastatic adenopathy. Stable nodularity at the left lung base in the lingula but no hypermetabolism. Recommend attention on future scans. Stable emphysematous changes. Stable atherosclerotic calcifications involving the aorta and branch vessels including the coronary arteries. ABDOMEN/PELVIS No abnormal hypermetabolic activity within the liver, pancreas, adrenal glands, or spleen. No hypermetabolic lymph nodes in the abdomen or pelvis. Marked hypermetabolism noted at the anal rectal junction without discrete mass in the CT scan. This could be due to internal hemorrhoids but recommend correlation with physical examination. Additional findings include diffuse fatty infiltration of the liver, gallstones, 5.7 x 5.6 infrarenal abdominal aortic aneurysm, bilateral inguinal hernias with the bladder partially in the right inguinal hernia. The aneurysm measured 3.7 x 3.8 cm on the prior CT scan from 2014. SKELETON No focal hypermetabolic activity to suggest skeletal metastasis. IMPRESSION: 1. Hypermetabolic cavitary left upper lobe lung mass consistent with neoplasm. No enlarged or hypermetabolic mediastinal/hilar lymph nodes or evidence of metastatic disease elsewhere. 2. 5.7 x 5.6 cm infrarenal abdominal aortic aneurysm increased since prior CT scan 2014 Vascular surgery consultation recommended due to increased risk of rupture for AAA >5.5 cm. This recommendation follows ACR consensus guidelines: White Paper of the ACR Incidental Findings Committee II on Vascular Findings. J Am Coll Radiol 2013; 10:789-794. 3. Focal area of marked increased uptake in the anal rectal junction region. This could be  due to internal hemorrhoids but recommend correlation with physical examination. Electronically Signed   By: Marijo Sanes M.D.   On: 08/03/2016 10:07   Dg Chest Port 1 View  Result Date: 07/19/2016 CLINICAL DATA:  Post bronchoscopy of the left upper lobe. EXAM: PORTABLE CHEST 1 VIEW COMPARISON:  PA and lateral chest x-ray of Jul 02, 2016 FINDINGS: There is diffuse increase in interstitial density in the left lung. The known area of density in the left upper lobe is slightly more conspicuous today. The right lung is adequately inflated and clear. Neither lung exhibits pneumothorax or pleural effusion. The heart and pulmonary vascularity are normal. There is calcification in the wall of the aortic arch. IMPRESSION: Increased interstitial density within the left lung may reflect bronchial lavage fluid or asymmetric pulmonary edema. No pneumothorax, significant atelectasis, or pleural effusion. Thoracic aortic atherosclerosis. Electronically Signed   By: David  Martinique M.D.   On: 07/19/2016 11:17   Dg C-arm Bronchoscopy  Result Date: 07/19/2016 C-ARM BRONCHOSCOPY: Fluoroscopy was utilized by the requesting physician.  No radiographic interpretation.    ASSESSMENT: This is a very pleasant 71 years old white male recently diagnosed with a  stage IB (T2a, N0, M0) non-small cell lung cancer, squamous cell carcinoma presented with cavitary left upper lobe lung mass diagnosed in June 2018.   PLAN: I had a lengthy discussion with the patient and his wife today about his current disease stage, prognosis and treatment options. I recommended for the patient to complete the staging workup by ordering a MRI of the brain to rule out brain metastasis. I will also arrange for the patient to have complete pulmonary function tests to evaluate his pulmonary function before seeing thoracic surgery for discussion of surgical resection. If the patient is not a good surgical candidate, I will arrange for him to see radiation  oncology for consideration of curative radiotherapy. We will arrange for the patient to come back for follow-up visit at the multidisciplinary thoracic oncology clinic for more discussion of his treatment options. For the COPD he will continue his routine follow-up visit with his pulmonologist. For the abdominal aortic aneurysm, the patient is followed by Dr. Donnetta Hutching. For smoking cessation, I strongly encouraged the patient to quit smoking and offered him a smoke cessation program. The patient was advised to call immediately if he has any concerning symptoms in the interval. The patient voices understanding of current disease status and treatment options and is in agreement with the current care plan.  All questions were answered. The patient knows to call the clinic with any problems, questions or concerns. We can certainly see the patient much sooner if necessary.  Thank you so much for allowing me to participate in the care of Tyler Mcmillan. I will continue to follow up the patient with you and assist in his care.  I spent 40 minutes counseling the patient face to face. The total time spent in the appointment was 60 minutes.  Disclaimer: This note was dictated with voice recognition software. Similar sounding words can inadvertently be transcribed and may not be corrected upon review.   Collen Vincent K. August 09, 2016, 4:45 PM

## 2016-08-09 NOTE — Telephone Encounter (Signed)
No additional appts scheduled per los 7/2 - Follow-up visit at [facility]. Hinton Dyer will arrange.

## 2016-08-10 ENCOUNTER — Telehealth: Payer: Self-pay | Admitting: *Deleted

## 2016-08-10 ENCOUNTER — Encounter: Payer: Self-pay | Admitting: Vascular Surgery

## 2016-08-10 NOTE — Telephone Encounter (Signed)
Oncology Nurse Navigator Documentation  Oncology Nurse Navigator Flowsheets 08/10/2016  Navigator Location [facility]  Navigator Encounter Type Telephone/I called resp care and was given an appt for Mr. Baylock for PFT's.  I called patient and updated him on appt.  I also gave him pre-procedure instruction.  Patient verbalized understanding of appt.  I contacted authorization coordinator to get PET authorized.   Telephone Outgoing Call  Treatment Phase Pre-Tx/Tx Discussion  Barriers/Navigation Needs Coordination of Care  Interventions Coordination of Care  Coordination of Care Appts;Other  Acuity Level 2  Time Spent with Patient 30

## 2016-08-12 ENCOUNTER — Encounter: Payer: Self-pay | Admitting: *Deleted

## 2016-08-12 ENCOUNTER — Telehealth: Payer: Self-pay | Admitting: *Deleted

## 2016-08-12 NOTE — Progress Notes (Signed)
Sent patient a map in the mail on location of MRI Brain.

## 2016-08-12 NOTE — Telephone Encounter (Signed)
Oncology Nurse Navigator Documentation  Oncology Nurse Navigator Flowsheets 08/12/2016  Navigator Location [facility]  Navigator Encounter Type Telephone/I followed up on Tyler Mcmillan MRI, it has not been scheduled but has bee authorized.  I called central scheduling to get an appt for patient.  I called patient back with an appt for MRI Brain and [place].  He verbalize understanding of appt time and place.   Telephone Outgoing Call  Treatment Phase Pre-Tx/Tx Discussion  Barriers/Navigation Needs Coordination of Care  Interventions Coordination of Care  Coordination of Care Appts;Radiology  Acuity Level 2  Time Spent with Patient 30

## 2016-08-13 NOTE — Telephone Encounter (Signed)
Spoke with pt. by phone 08/10/16.  Stated he would like to plan for EVAR on 09/08/16.  Will make Dr. Donnetta Hutching aware, and check for Pulmonology clearance.

## 2016-08-16 ENCOUNTER — Ambulatory Visit (HOSPITAL_COMMUNITY)
Admission: RE | Admit: 2016-08-16 | Discharge: 2016-08-16 | Disposition: A | Discharge status: Home / Self Care | Payer: Medicare Other | Source: Ambulatory Visit | Attending: Internal Medicine | Admitting: Internal Medicine

## 2016-08-16 DIAGNOSIS — C3492 Malignant neoplasm of unspecified part of left bronchus or lung: Secondary | ICD-10-CM | POA: Diagnosis present

## 2016-08-16 DIAGNOSIS — C3490 Malignant neoplasm of unspecified part of unspecified bronchus or lung: Secondary | ICD-10-CM | POA: Diagnosis not present

## 2016-08-16 MED ORDER — GADOBENATE DIMEGLUMINE 529 MG/ML IV SOLN
20.0000 mL | Freq: Once | INTRAVENOUS | Status: AC | PRN
  Administered 2016-08-16: 20 mL via INTRAVENOUS

## 2016-08-17 ENCOUNTER — Telehealth: Payer: Self-pay

## 2016-08-17 ENCOUNTER — Telehealth: Payer: Self-pay | Admitting: *Deleted

## 2016-08-17 ENCOUNTER — Encounter (HOSPITAL_COMMUNITY): Payer: Medicare Other

## 2016-08-17 LAB — FUNGUS CULTURE RESULT

## 2016-08-17 LAB — FUNGUS CULTURE WITH STAIN

## 2016-08-17 LAB — FUNGAL ORGANISM REFLEX

## 2016-08-17 NOTE — Telephone Encounter (Signed)
Oncology Nurse Navigator Documentation  Oncology Nurse Navigator Flowsheets 08/17/2016  Navigator Location [facility]  Navigator Encounter Type Telephone/Mr. Leonhard called with questions about appt.  He states he cancelled his PFT's.  I stated we need to re-schedule with T surgery.  He states he will call them back and get the appt back.  I updated him on appt for 08/19/16 with T surgery.   Telephone Outgoing Call  Treatment Phase Pre-Tx/Tx Discussion  Barriers/Navigation Needs Education  Education Other  Interventions Education  Education Method Verbal  Acuity Level 2  Time Spent with Patient 30

## 2016-08-17 NOTE — Telephone Encounter (Signed)
Pt. Called and stated he wanted to move ahead and have his abdominal aneurysm repaired.  Reported that his GI doctor told him recently that he should plan to get the aneurysm repaired.    Discussed with Dr. Donnetta Hutching.  Recommended that the pt. Follow through with Oncology for staging and treatment options of his lung cancer, before the decision is made to move forward with an EVAR.    Called pt. Back.  Advised of Dr. Luther Parody recommendation. Voiced concern that he isn't getting the care he should be getting.  Stated his lung cancer is a small area, and he is not going to go through with surgery.  Stated he hasn't met with the Radiation Oncologist yet, to determine about receiving radiation.  Pt. agreed to make an appt., to further discuss the aneurysm with Dr. Donnetta Hutching. Advised will have an Market researcher contact him with an appt.

## 2016-08-17 NOTE — Telephone Encounter (Signed)
Oncology Nurse Navigator Documentation  Oncology Nurse Navigator Flowsheets 08/17/2016  Navigator Location [facility]  Navigator Encounter Type Telephone/I called Tyler Mcmillan back today.  He states he does not want to be seen by T surgery this week. I listened as he explained.  I educated on why he was being seen.  He states he will call me in Aug to be set up for an appt.   Telephone Outgoing Call  Treatment Phase Pre-Tx/Tx Discussion  Barriers/Navigation Needs Coordination of Care;Education  Education Other  Interventions Coordination of Care;Education  Coordination of Care Other  Education Method Verbal  Acuity Level 2  Time Spent with Patient 30

## 2016-08-18 ENCOUNTER — Encounter: Payer: Self-pay | Admitting: *Deleted

## 2016-08-18 ENCOUNTER — Telehealth: Payer: Self-pay | Admitting: Medical Oncology

## 2016-08-18 DIAGNOSIS — C3492 Malignant neoplasm of unspecified part of left bronchus or lung: Secondary | ICD-10-CM

## 2016-08-18 NOTE — Progress Notes (Signed)
Oncology Nurse Navigator Documentation  Oncology Nurse Navigator Flowsheets 08/18/2016  Navigator Location [facility]  Navigator Encounter Type Telephone/Tyler Mcmillan called and left me a vm message.  He then called again and I was able to answer the phone.  He states he cancelled appt's but would now like to be seen at the same time.  I undated him that unfortunately, the appt was not available now.  I updated him that I will make referral for him to be seen by T surgery and they will call with an appt.  He verbalized understanding of process for appt  Telephone Incoming Call  Treatment Phase Pre-Tx/Tx Discussion  Barriers/Navigation Needs Coordination of Care  Interventions Coordination of Care  Coordination of Care Other  Acuity Level 2  Time Spent with Patient 30

## 2016-08-18 NOTE — Telephone Encounter (Signed)
Needs to r/s . Transferred to [place].

## 2016-08-18 NOTE — Telephone Encounter (Signed)
Sched appt 09/15/16 at 3:45. Pt does not want to come in until after his cancer is treated. cxl'd appt.

## 2016-08-19 ENCOUNTER — Encounter (HOSPITAL_COMMUNITY): Payer: Medicare Other

## 2016-08-23 ENCOUNTER — Ambulatory Visit (HOSPITAL_COMMUNITY)
Admission: RE | Admit: 2016-08-23 | Discharge: 2016-08-23 | Disposition: A | Discharge status: Home / Self Care | Payer: Medicare Other | Source: Ambulatory Visit | Attending: Internal Medicine | Admitting: Internal Medicine

## 2016-08-23 ENCOUNTER — Encounter: Payer: Self-pay | Admitting: *Deleted

## 2016-08-23 DIAGNOSIS — C3492 Malignant neoplasm of unspecified part of left bronchus or lung: Secondary | ICD-10-CM | POA: Diagnosis not present

## 2016-08-23 LAB — PULMONARY FUNCTION TEST
DL/VA % PRED: 79 %
DL/VA: 3.63 ml/min/mmHg/L
DLCO UNC % PRED: 63 %
DLCO unc: 20.58 ml/min/mmHg
FEF 25-75 POST: 1.04 L/s
FEF 25-75 PRE: 0.84 L/s
FEF2575-%CHANGE-POST: 23 %
FEF2575-%PRED-POST: 43 %
FEF2575-%PRED-PRE: 34 %
FEV1-%Change-Post: 5 %
FEV1-%PRED-POST: 66 %
FEV1-%Pred-Pre: 63 %
FEV1-PRE: 2.02 L
FEV1-Post: 2.13 L
FEV1FVC-%CHANGE-POST: -3 %
FEV1FVC-%PRED-PRE: 78 %
FEV6-%CHANGE-POST: 7 %
FEV6-%PRED-PRE: 78 %
FEV6-%Pred-Post: 83 %
FEV6-Post: 3.45 L
FEV6-Pre: 3.22 L
FEV6FVC-%Change-Post: -2 %
FEV6FVC-%Pred-Post: 94 %
FEV6FVC-%Pred-Pre: 97 %
FVC-%CHANGE-POST: 9 %
FVC-%PRED-PRE: 80 %
FVC-%Pred-Post: 88 %
FVC-POST: 3.87 L
FVC-PRE: 3.53 L
POST FEV1/FVC RATIO: 55 %
POST FEV6/FVC RATIO: 89 %
PRE FEV1/FVC RATIO: 57 %
Pre FEV6/FVC Ratio: 91 %
RV % pred: 133 %
RV: 3.31 L
TLC % pred: 98 %
TLC: 6.95 L

## 2016-08-23 MED ORDER — ALBUTEROL SULFATE (2.5 MG/3ML) 0.083% IN NEBU
2.5000 mg | INHALATION_SOLUTION | Freq: Once | RESPIRATORY_TRACT | Status: AC
  Administered 2016-08-23: 2.5 mg via RESPIRATORY_TRACT

## 2016-08-25 NOTE — Progress Notes (Signed)
[facility] Social Work  Clinical Social Work was referred by patient to review and complete healthcare advance directives.  Clinical Social Worker met with patient and spouse in [place] office.  The patient designated spouse, Ondre Salvetti, as their primary healthcare agent and son, Tito Dine, as their secondary agent.  Patient also completed healthcare living will.    Clinical Social Worker notarized documents and made copies for patient/family. Clinical Social Worker will send documents to medical records to be scanned into patient's chart. Clinical Social Worker encouraged patient/family to contact with any additional questions or concerns.  Maryjean Morn, MSW, LCSW Clinical Social Worker [facility] [phone]

## 2016-08-26 ENCOUNTER — Institutional Professional Consult (permissible substitution) (INDEPENDENT_AMBULATORY_CARE_PROVIDER_SITE_OTHER): Payer: Medicare Other | Admitting: Cardiothoracic Surgery

## 2016-08-26 ENCOUNTER — Encounter: Payer: Self-pay | Admitting: Cardiothoracic Surgery

## 2016-08-26 VITALS — BP 142/79 | HR 76 | Resp 18 | Ht 70.0 in | Wt 205.0 lb

## 2016-08-26 DIAGNOSIS — C3412 Malignant neoplasm of upper lobe, left bronchus or lung: Secondary | ICD-10-CM

## 2016-08-26 NOTE — Progress Notes (Signed)
[address]       [place] [address]             [id number]                    [name] [place] Medical Record #[id number] Date of Birth: November 30, 1945  Referring: Curt Bears, MD Primary Care: [name], MD  Chief Complaint:    Chief Complaint  Patient presents with  . Lung Cancer    eval for resection.Marland KitchenMarland KitchenCTA 5/25, PET 6/26, BX 6/11, PFT 7/16, MRI BRAIN 7/9    History of Present Illness:    Tyler Mcmillan 71 y.o. male is seen in the office  today for For evaluation of abdominal aortic aneurysm and left lung mass. On Memorial Day weekend the patient noted increasing shortness of breath and chest discomfort ultimately he was admitted to the hospital, had CT scan of the chest and cardiac catheterization. His catheterization was without significant disease and he was discharged home. Since discharge she's had further evaluation with PET scan. On June 11 he underwent bronchoscopy by the pulmonary service. With biopsy of the left upper lobe:  Diagnosis Lung, transbronchial biopsy - NON-SMALL CELL CARCINOMA, SEE COMMENT. Microscopic Comment There is a fragment with malignant cells and desmoplastic stroma. The morphology favors a squamous cell carcinoma. Other fragments show inflammation including giant cells and fragments of necrosis. AFB, GMS, and PAS are negative for organisms. Dr. Lyndon Code has reviewed the case. Dr. Vaughan Browner was paged on 07/20/2016. There is likely insufficient tissue for ancillary studies. [name] MD    Current Activity/ Functional Status:  Patient is independent with mobility/ambulation, transfers, ADL's, IADL's.   Zubrod Score: At the time of surgery this patient's most appropriate activity status/level should be described as: []     0    Normal activity, no symptoms [x]     1    Restricted in physical strenuous activity but ambulatory, able to do out light work []     2    Ambulatory and capable of self care, unable to do  work activities, up and about               >50 % of waking hours                              []     3    Only limited self care, in bed greater than 50% of waking hours []     4    Completely disabled, no self care, confined to bed or chair []     5    Moribund   Past Medical History:  Diagnosis Date  . AAA (abdominal aortic aneurysm) without rupture ([place])    [place] ABD.  DONE  JUNE 2014  3.5  . Bladder neoplasm   . Chronic anxiety   . GERD (gastroesophageal reflux disease)   . Hypertension   . 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 CYSTOSCOPY W/ URETERAL STENT PLACEMENT Left 10/04/2012   Procedure: CYSTOSCOPY WITH RETROGRADE PYELOGRAM/URETERAL STENT PLACEMENT   "POSSIBLE LEFT STENT";  Surgeon: Alexis Frock, MD;  Location: [facility];  Service: Urology;  Laterality: Left;  . LEFT HEART CATH AND CORONARY ANGIOGRAPHY N/A 07/02/2016   Procedure: Left Heart Cath and Coronary Angiography;  Surgeon: Martinique, Peter M, MD;  Location[facility]  INVASIVE CV LAB;  Service: Cardiovascular;  Laterality: N/A;  . MEATOTOMY N/A 10/04/2012   Procedure: MEATOTOMY ADULT;  Surgeon: Alexis Frock, MD;  Location: [facility];  Service: Urology;  Laterality: N/A;  . TRANSURETHRAL RESECTION OF BLADDER TUMOR WITH GYRUS (TURBT-GYRUS) N/A 10/04/2012   Procedure: TRANSURETHRAL RESECTION OF BLADDER TUMOR WITH GYRUS (TURBT-GYRUS);  Surgeon: Alexis Frock, MD;  Location: [facility];  Service: Urology;  Laterality: N/A;  . VIDEO BRONCHOSCOPY Bilateral 07/19/2016   Procedure: VIDEO BRONCHOSCOPY WITH FLUORO;  Surgeon: Marshell Garfinkel, MD;  Location: [facility];  Service: Cardiopulmonary;  Laterality: Bilateral;    Family History  Problem Relation Age of Onset  . Cancer Mother   . Heart disease Father        before age 37  . Heart attack Father   . Cancer Brother     Social History   Social History    . Marital status: Married    Spouse name: N/A  . Number of children: N/A  . Years of education: N/A   Occupational History  . Not on file.   Social History Main Topics  . Smoking status: Current Every Day Smoker    Packs/day: 0.50    Years: 50.00    Types: Cigarettes  . Smokeless tobacco: Never Used  . Alcohol use 14.4 oz/week    10 Glasses of wine, 14 Cans of beer per week     Comment: few glases wine per day, beer  . Drug use: No  . Sexual activity: Not on file   Other Topics Concern  . Not on file   Social History Narrative  . No narrative on file    History  Smoking Status  . Current Every Day Smoker  . Packs/day: 0.50  . Years: 50.00  . Types: Cigarettes  Smokeless Tobacco  . Never Used    History  Alcohol Use  . 14.4 oz/week  . 10 Glasses of wine, 14 Cans of beer per week    Comment: few glases wine per day, beer     No Known Allergies  Current Outpatient Prescriptions  Medication Sig Dispense Refill  . ALPRAZolam (XANAX) 0.5 MG tablet Take 0.5 mg by mouth 2 (two) times daily.    [name] amLODipine (NORVASC) 2.5 MG tablet Take 1 tablet (2.5 mg total) by mouth daily. 30 tablet 0  . atenolol (TENORMIN) 50 MG tablet Take 50 mg by mouth daily.     . clonazePAM (KLONOPIN) 0.5 MG tablet Take 0.5 mg by mouth 2 (two) times daily as needed for anxiety.    [name] ibuprofen (ADVIL,MOTRIN) 200 MG tablet Take 400 mg by mouth daily as needed for mild pain.     No current facility-administered medications for this visit.     Pertinent items are noted in HPI.   Review of Systems:     Cardiac Review of Systems: Y or N  Chest Pain [ y   ]  Resting SOB Blue.Reese   ] Exertional SOB  Blue.Reese  ]  Orthopnea [ y ]   Pedal Edema [ n  ]    Palpitations [ n ] Syncope  [ n ]   Presyncope [  n ]  General Review of Systems: [Y] = yes [  ]=no Constitional: recent weight change [ n ];  Wt loss over the last 3 months [   ] anorexia [  ]; fatigue [  ]; nausea [  ]; night sweats [  ]; fever [  ];  or  chills [  ];          Dental: poor dentition[  ]; Last Dentist visit:   Eye : blurred vision [  ]; diplopia [   ]; vision changes [  ];  Amaurosis fugax[  ]; Resp: cough [ y ];  wheezing[ y ];  hemoptysis[n  ]; shortness of breath[y  ]; paroxysmal nocturnal dyspnea[y  ]; dyspnea on exertion[ y ]; or orthopnea[  ];  GI:  gallstones[  ], vomiting[  ];  dysphagia[  ]; melena[  ];  hematochezia [  ]; heartburn[  ];   Hx of  Colonoscopy[  ]; GU: kidney stones [  ]; hematuria[  ];   dysuria [  ];  nocturia[  ];  history of     obstruction [  ]; urinary frequency [  ]             Skin: rash, swelling[  ];, hair loss[  ];  peripheral edema[  ];  or itching[  ]; Musculosketetal: myalgias[  ];  joint swelling[  ];  joint erythema[  ];  joint pain[  ];  back pain[  ];  Heme/Lymph: bruising[  ];  bleeding[  ];  anemia[  ];  Neuro: TIA[ n];  headaches[ n ];  stroke[n  ];  vertigo[n  ];  seizures[ n ];   paresthesias[n  ];  difficulty walking[ n ];  Psych:depression[  ]; anxiety[  ];  Endocrine: diabetes[  n];  thyroid dysfunction[  n];  Immunizations: Flu up to date Florencio.Farrier  ]; Pneumococcal up to date [ n ];  Other:  Physical Exam: BP (!) 142/79 (BP Location: Right Arm, Patient Position: Sitting, Cuff Size: Large)   Pulse 76   Resp 18   Ht 5\' 10"  (1.778 m)   Wt 205 lb (93 kg)   SpO2 96% Comment: ON RA  BMI 29.41 kg/m   PHYSICAL EXAMINATION: General appearance: alert, cooperative, appears older than stated age and mild distress Head: Normocephalic, without obvious abnormality, atraumatic Neck: no adenopathy, no carotid bruit, no JVD, supple, symmetrical, trachea midline and thyroid not enlarged, symmetric, no tenderness/mass/nodules Lymph nodes: Cervical, supraclavicular, and axillary nodes normal. Resp: diminished breath sounds bibasilar Back: symmetric, no curvature. ROM normal. No CVA tenderness. Cardio: regular rate and rhythm, S1, S2 normal, no murmur, click, rub or gallop GI: soft, non-tender;  bowel sounds normal; no masses,  no organomegaly Extremities: extremities normal, atraumatic, no cyanosis or edema Neurologic: Grossly normal Even at rest in the exam room the patient's mildly short of breath.  Diagnostic Studies & Laboratory data:     Recent Radiology Findings:   Mr Jeri Cos UX Contrast  Result Date: 08/16/2016 CLINICAL DATA:  Squamous cell lung cancer.  Initial staging. EXAM: MRI HEAD WITHOUT AND WITH CONTRAST TECHNIQUE: Multiplanar, multiecho pulse sequences of the brain and surrounding structures were obtained without and with intravenous contrast. CONTRAST:  61mL MULTIHANCE GADOBENATE DIMEGLUMINE 529 MG/ML IV SOLN COMPARISON:  None. FINDINGS: Brain: No acute infarction, hemorrhage, hydrocephalus, extra-axial collection or mass lesion. Generalized cortical atrophy. Minimal microvascular ischemic type changes in the cerebral white matter. Vascular: Major flow voids are present Skull and upper cervical spine: No evidence of marrow lesion Sinuses/Orbits: Negative IMPRESSION: Negative for intracranial metastasis. Electronically Signed   By: Monte Fantasia M.D.   On: 08/16/2016 08:28   Nm Pet Image Initial (pi) Skull Base To Thigh  Result Date: 08/03/2016 CLINICAL DATA:  Initial treatment strategy for cavitary lung lesion.  EXAM: NUCLEAR MEDICINE PET SKULL BASE TO THIGH TECHNIQUE: 10.0 mCi F-18 FDG was injected intravenously. Full-ring PET imaging was performed from the skull base to thigh after the radiotracer. CT data was obtained and used for attenuation correction and anatomic localization. FASTING BLOOD GLUCOSE:  Value: 146 mg/dl COMPARISON:  CT chest 07/02/2016 FINDINGS: NECK No hypermetabolic lymph nodes in the neck. CHEST 3.8 cm left upper lobe cavitary lesion demonstrates a thicker ram of soft tissue and less central cavitation when compared to the chest CT from 1 month ago. The lesion is hypermetabolic with SUV max of 70.6 and is concerning for squamous cell neoplasm. No  enlarged or hypermetabolic mediastinal/hilar lymph nodes to suggest metastatic adenopathy. Stable nodularity at the left lung base in the lingula but no hypermetabolism. Recommend attention on future scans. Stable emphysematous changes. Stable atherosclerotic calcifications involving the aorta and branch vessels including the coronary arteries. ABDOMEN/PELVIS No abnormal hypermetabolic activity within the liver, pancreas, adrenal glands, or spleen. No hypermetabolic lymph nodes in the abdomen or pelvis. Marked hypermetabolism noted at the anal rectal junction without discrete mass in the CT scan. This could be due to internal hemorrhoids but recommend correlation with physical examination. Additional findings include diffuse fatty infiltration of the liver, gallstones, 5.7 x 5.6 infrarenal abdominal aortic aneurysm, bilateral inguinal hernias with the bladder partially in the right inguinal hernia. The aneurysm measured 3.7 x 3.8 cm on the prior CT scan from 2014. SKELETON No focal hypermetabolic activity to suggest skeletal metastasis. IMPRESSION: 1. Hypermetabolic cavitary left upper lobe lung mass consistent with neoplasm. No enlarged or hypermetabolic mediastinal/hilar lymph nodes or evidence of metastatic disease elsewhere. 2. 5.7 x 5.6 cm infrarenal abdominal aortic aneurysm increased since prior CT scan 2014 Vascular surgery consultation recommended due to increased risk of rupture for AAA >5.5 cm. This recommendation follows ACR consensus guidelines: White Paper of the ACR Incidental Findings Committee II on Vascular Findings. J Am Coll Radiol 2013; 10:789-794. 3. Focal area of marked increased uptake in the anal rectal junction region. This could be due to internal hemorrhoids but recommend correlation with physical examination. Electronically Signed   By: Marijo Sanes M.D.   On: 08/03/2016 10:07    I have independently reviewed the above radiology studies  and reviewed the findings with the  patient.  CATH: Procedures   Left Heart Cath and Coronary Angiography  Conclusion     The left ventricular systolic function is normal.  LV end diastolic pressure is normal.  The left ventricular ejection fraction is 55-65% by visual estimate.   1. Nonobstructive CAD 2. Normal LV function 3. Normal LVEDP  Plan: risk factor modification. I suspect his chest pain is more related to his pulmonary abnormality and cough.     Recent Lab Findings: Lab Results  Component Value Date   WBC 6.6 08/09/2016   HGB 16.0 08/09/2016   HCT 47.1 08/09/2016   PLT 233 08/09/2016   GLUCOSE 106 08/09/2016   ALT 45 08/09/2016   AST 85 (H) 08/09/2016   NA 133 (L) 08/09/2016   K 4.7 08/09/2016   CL 97 (L) 07/02/2016   CREATININE 0.8 08/09/2016   BUN 3.9 (L) 08/09/2016   CO2 27 08/09/2016   PFT's  FEV1 2.02 63% 20.58 63 %   Pulmonary Function Diagnosis: Moderate Obstructive Airways Disease Restriction -Probable Moderate Diffusion Defect  Assessment / Plan:    3.8 cm left upper lobe cavitary lesion  Hypermetabolic  consistent with neoplasm.  Clinical stage IB squamous cell carcinoma  the lung, left upper-lobe.  the patient has moderate airway disease and diffusion deficit , he continues to smoke, before proceeding with upper lobectomy further evaluation of his pulmonary reserve is indicated with CPX testing. I discussed with he and his wife the risks and options of surgical treatment versus radiation. If his functional status is adequate lobectomy would be preferred treatment.   5.7 x 5.6 infrarenal abdominal aortic aneurysm, The aneurysm measured 3.7 x 3.8 cm on the prior CT scan from 2014.-With significant enlargement over fairly short period time averaging greater than half a centimeter per year. With the fairly rapid expansion of the patient's abdominal aortic aneurysm now almost 6 cm in growing half a centimeter a year and suitable for stent grafting- I discussed with Dr. early for  seeding in the very near future with stenting of the abdominal aortic aneurysm to prevent rupture as we are treating his what appears to be early stage lung cancer. This is tentatively plan for August 1. I will see the patient back the following week   bilateral inguinal hernias with the bladder partially in the right inguinal hernia.  Patient admits to fairly heavy daily alcohol use, in addition he continues to smoke daily    I  spent 40 minutes counseling the patient face to face and 50% or more the  time was spent in counseling and coordination of care. The total time spent in the appointment was 60 minutes.  Grace Isaac MD      [address] [place] [address] Office [phone]   Beeper [phone]  08/26/2016 10:07 AM

## 2016-08-30 ENCOUNTER — Other Ambulatory Visit: Payer: Self-pay

## 2016-09-01 LAB — ACID FAST CULTURE WITH REFLEXED SENSITIVITIES (MYCOBACTERIA): Acid Fast Culture: NEGATIVE

## 2016-09-03 NOTE — Pre-Procedure Instructions (Signed)
Tyler Mcmillan  09/03/2016      [id number] - [place], [place] - [address] [place] [address] Phone: [phone] Fax: [place], [place] [address] Phone: [phone] Fax: [phone]    Your procedure is scheduled on Wednesday August 1.  Report to [facility] Admitting at 6:30 A.M.  Call this number if you have problems the morning of surgery:  [phone]   Remember:  Do not eat food or drink liquids after midnight.  Take these medicines the morning of surgery with A SIP OF WATER: amlodipine (norvasc), atenolol (tenormin), clonazepam (klonopin) if needed or alprazolam (Xanax) if needed, flonase if needed  7 days prior to surgery STOP taking any Aspirin, Aleve, Naproxen, Ibuprofen, Motrin, Advil, Goody's, BC's, all herbal medications, fish oil, and all vitamins    Do not wear jewelry  Do not wear lotions, powders, or colognes, or deoderant.  Men may shave face and neck.  Do not bring valuables to the hospital.  [facility] is not responsible for any belongings or valuables.  Contacts, dentures or bridgework may not be worn into surgery.  Leave your suitcase in the car.  After surgery it may be brought to your room.  For patients admitted to the hospital, discharge time will be determined by your treatment team.  Patients discharged the day of surgery will not be allowed to drive home.     Special instructions:    [facility]- Preparing For Surgery  Before surgery, you can play an important role. Because skin is not sterile, your skin needs to be as free of germs as possible. You can reduce the number of germs on your skin by washing with CHG (chlorahexidine gluconate) Soap before surgery.  CHG is an antiseptic cleaner which kills germs and bonds with the skin to continue killing germs even after washing.  Please do not use if  you have an allergy to CHG or antibacterial soaps. If your skin becomes reddened/irritated stop using the CHG.  Do not shave (including legs and underarms) for at least 48 hours prior to first CHG shower. It is OK to shave your face.  Please follow these instructions carefully.   1. Shower the NIGHT BEFORE SURGERY and the MORNING OF SURGERY with CHG.   2. If you chose to wash your hair, wash your hair first as usual with your normal shampoo.  3. After you shampoo, rinse your hair and body thoroughly to remove the shampoo.  4. Use CHG as you would any other liquid soap. You can apply CHG directly to the skin and wash gently with a scrungie or a clean washcloth.   5. Apply the CHG Soap to your body ONLY FROM THE NECK DOWN.  Do not use on open wounds or open sores. Avoid contact with your eyes, ears, mouth and genitals (private parts). Wash genitals (private parts) with your normal soap.  6. Wash thoroughly, paying special attention to the area where your surgery will be performed.  7. Thoroughly rinse your body with warm water from the neck down.  8. DO NOT shower/wash with your normal soap after using and rinsing off the CHG Soap.  9. Pat yourself dry with a CLEAN TOWEL.   10. Wear CLEAN PAJAMAS   11. Place CLEAN SHEETS on your bed the night of your first shower and DO NOT SLEEP WITH PETS.  Day of Surgery: Do not apply any deodorants/lotions. Please wear clean clothes to the hospital/surgery center.      Please read over the following fact sheets that you were given. MRSA Information

## 2016-09-06 ENCOUNTER — Encounter (HOSPITAL_COMMUNITY): Payer: Self-pay

## 2016-09-06 ENCOUNTER — Encounter (HOSPITAL_COMMUNITY)
Admission: RE | Admit: 2016-09-06 | Discharge: 2016-09-06 | Disposition: A | Discharge status: Home / Self Care | Payer: Medicare Other | Source: Ambulatory Visit | Attending: Vascular Surgery | Admitting: Vascular Surgery

## 2016-09-06 DIAGNOSIS — R05 Cough: Secondary | ICD-10-CM | POA: Diagnosis not present

## 2016-09-06 DIAGNOSIS — R079 Chest pain, unspecified: Secondary | ICD-10-CM | POA: Diagnosis not present

## 2016-09-06 DIAGNOSIS — F17210 Nicotine dependence, cigarettes, uncomplicated: Secondary | ICD-10-CM | POA: Diagnosis not present

## 2016-09-06 DIAGNOSIS — K219 Gastro-esophageal reflux disease without esophagitis: Secondary | ICD-10-CM | POA: Diagnosis not present

## 2016-09-06 DIAGNOSIS — Z8249 Family history of ischemic heart disease and other diseases of the circulatory system: Secondary | ICD-10-CM | POA: Diagnosis not present

## 2016-09-06 DIAGNOSIS — I119 Hypertensive heart disease without heart failure: Secondary | ICD-10-CM | POA: Diagnosis not present

## 2016-09-06 DIAGNOSIS — I739 Peripheral vascular disease, unspecified: Secondary | ICD-10-CM | POA: Diagnosis not present

## 2016-09-06 DIAGNOSIS — Z8551 Personal history of malignant neoplasm of bladder: Secondary | ICD-10-CM | POA: Diagnosis not present

## 2016-09-06 DIAGNOSIS — Z6831 Body mass index (BMI) 31.0-31.9, adult: Secondary | ICD-10-CM | POA: Diagnosis not present

## 2016-09-06 DIAGNOSIS — E669 Obesity, unspecified: Secondary | ICD-10-CM | POA: Diagnosis not present

## 2016-09-06 DIAGNOSIS — Z01812 Encounter for preprocedural laboratory examination: Secondary | ICD-10-CM | POA: Insufficient documentation

## 2016-09-06 DIAGNOSIS — I714 Abdominal aortic aneurysm, without rupture: Secondary | ICD-10-CM | POA: Diagnosis not present

## 2016-09-06 DIAGNOSIS — C3492 Malignant neoplasm of unspecified part of left bronchus or lung: Secondary | ICD-10-CM | POA: Diagnosis not present

## 2016-09-06 DIAGNOSIS — I10 Essential (primary) hypertension: Secondary | ICD-10-CM | POA: Diagnosis not present

## 2016-09-06 DIAGNOSIS — F419 Anxiety disorder, unspecified: Secondary | ICD-10-CM | POA: Diagnosis not present

## 2016-09-06 HISTORY — DX: Malignant neoplasm of bladder, unspecified: C67.9

## 2016-09-06 LAB — URINALYSIS, ROUTINE W REFLEX MICROSCOPIC
Bacteria, UA: NONE SEEN
Bilirubin Urine: NEGATIVE
Glucose, UA: NEGATIVE mg/dL
KETONES UR: NEGATIVE mg/dL
LEUKOCYTES UA: NEGATIVE
Nitrite: NEGATIVE
PROTEIN: NEGATIVE mg/dL
SQUAMOUS EPITHELIAL / LPF: NONE SEEN
Specific Gravity, Urine: 1.003 — ABNORMAL LOW (ref 1.005–1.030)
pH: 8 (ref 5.0–8.0)

## 2016-09-06 LAB — BLOOD GAS, ARTERIAL
Acid-Base Excess: 2.3 mmol/L — ABNORMAL HIGH (ref 0.0–2.0)
Bicarbonate: 26 mmol/L (ref 20.0–28.0)
DRAWN BY: 470591
FIO2: 21
O2 Saturation: 96.5 %
PCO2 ART: 38.5 mmHg (ref 32.0–48.0)
PH ART: 7.445 (ref 7.350–7.450)
Patient temperature: 98.6
pO2, Arterial: 80.9 mmHg — ABNORMAL LOW (ref 83.0–108.0)

## 2016-09-06 LAB — COMPREHENSIVE METABOLIC PANEL
ALBUMIN: 3.4 g/dL — AB (ref 3.5–5.0)
ALT: 33 U/L (ref 17–63)
AST: 84 U/L — AB (ref 15–41)
Alkaline Phosphatase: 89 U/L (ref 38–126)
Anion gap: 9 (ref 5–15)
BILIRUBIN TOTAL: 1.5 mg/dL — AB (ref 0.3–1.2)
CO2: 23 mmol/L (ref 22–32)
CREATININE: 0.65 mg/dL (ref 0.61–1.24)
Calcium: 8.8 mg/dL — ABNORMAL LOW (ref 8.9–10.3)
Chloride: 96 mmol/L — ABNORMAL LOW (ref 101–111)
GFR calc Af Amer: >60 mL/min (ref >60)
GLUCOSE: 109 mg/dL — AB (ref 65–99)
Potassium: 4.3 mmol/L (ref 3.5–5.1)
Sodium: 128 mmol/L — ABNORMAL LOW (ref 135–145)
TOTAL PROTEIN: 7.3 g/dL (ref 6.5–8.1)

## 2016-09-06 LAB — PROTIME-INR
INR: 1.15
PROTHROMBIN TIME: 14.8 s (ref 11.4–15.2)

## 2016-09-06 LAB — CBC
HEMATOCRIT: 45.1 % (ref 39.0–52.0)
HEMOGLOBIN: 14.5 g/dL (ref 13.0–17.0)
MCH: 33.6 pg (ref 26.0–34.0)
MCHC: 32.2 g/dL (ref 30.0–36.0)
MCV: 104.4 fL — AB (ref 78.0–100.0)
Platelets: 176 10*3/uL (ref 150–400)
RBC: 4.32 MIL/uL (ref 4.22–5.81)
RDW: 13.5 % (ref 11.5–15.5)
WBC: 5.9 10*3/uL (ref 4.0–10.5)

## 2016-09-06 LAB — ABO/RH: ABO/RH(D): AB POS

## 2016-09-06 LAB — SURGICAL PCR SCREEN
MRSA, PCR: NEGATIVE
STAPHYLOCOCCUS AUREUS: NEGATIVE

## 2016-09-06 LAB — PREPARE RBC (CROSSMATCH)

## 2016-09-06 LAB — APTT: aPTT: 38 seconds — ABNORMAL HIGH (ref 24–36)

## 2016-09-06 MED ORDER — CHLORHEXIDINE GLUCONATE 4 % EX LIQD: 60.0000 mL | Freq: Once | CUTANEOUS | Status: DC

## 2016-09-06 MED ORDER — CHLORHEXIDINE GLUCONATE 4 % EX LIQD
60.0000 mL | Freq: Once | CUTANEOUS | Status: DC
Start: 2016-09-07 — End: 2016-09-07

## 2016-09-06 NOTE — Progress Notes (Signed)
PCP - [name] Cardiologist - pt denies cardiac history, states he does not remember all of his doctors.   Chest x-ray - 07/19/16 EKG - 07/03/16 Stress Test - 08/09/12 Cardiac Cath - 07/02/16  Pt takes amlodipine for blood pressure but states he has not been taking this, but has been taking his atenolol. Pt states he does not always need the amlodipine.    Patient denies shortness of breath, fever, cough and chest pain at PAT appointment   Patient verbalized understanding of instructions that were given to them at the PAT appointment. Patient was also instructed that they will need to review over the PAT instructions again at home before surgery.

## 2016-09-07 ENCOUNTER — Encounter (HOSPITAL_COMMUNITY): Payer: Self-pay | Admitting: Anesthesiology

## 2016-09-07 NOTE — Anesthesia Preprocedure Evaluation (Addendum)
Anesthesia Evaluation  Patient identified by MRN, date of birth, ID band Patient awake    Reviewed: Allergy & Precautions, NPO status , Patient's Chart, lab work & pertinent test results  Airway Mallampati: IV  TM Distance: >3 FB Neck ROM: Full    Dental  (+) Teeth Intact, Dental Advisory Given   Pulmonary neg pulmonary ROS, Current Smoker,     + decreased breath sounds      Cardiovascular hypertension, Pt. on medications and Pt. on home beta blockers + Peripheral Vascular Disease  negative cardio ROS   Rhythm:Regular Rate:Normal     Neuro/Psych Anxiety negative neurological ROS     GI/Hepatic Neg liver ROS, GERD  ,  Endo/Other  negative endocrine ROS  Renal/GU negative Renal ROS     Musculoskeletal  (+) Arthritis ,   Abdominal   Peds  Hematology negative hematology ROS (+)   Anesthesia Other Findings Day of surgery medications reviewed with the patient.  Reproductive/Obstetrics                            Lab Results  Component Value Date   WBC 5.9 09/06/2016   HGB 14.5 09/06/2016   HCT 45.1 09/06/2016   MCV 104.4 (H) 09/06/2016   PLT 176 09/06/2016   Lab Results  Component Value Date   CREATININE 0.65 09/06/2016   BUN <5 (L) 09/06/2016   NA 128 (L) 09/06/2016   K 4.3 09/06/2016   CL 96 (L) 09/06/2016   CO2 23 09/06/2016   Lab Results  Component Value Date   INR 1.15 09/06/2016   EKG: NSR  Anesthesia Physical Anesthesia Plan  ASA: III  Anesthesia Plan: General   Post-op Pain Management:    Induction: Intravenous  PONV Risk Score and Plan: 2 and Ondansetron and Dexamethasone  Airway Management Planned: Oral ETT and Video Laryngoscope Planned  Additional Equipment: Arterial line  Intra-op Plan:   Post-operative Plan: Extubation in OR  Informed Consent: I have reviewed the patients History and Physical, chart, labs and discussed the procedure including the  risks, benefits and alternatives for the proposed anesthesia with the patient or authorized representative who has indicated his/her understanding and acceptance.     Plan Discussed with: CRNA  Anesthesia Plan Comments:        Anesthesia Quick Evaluation

## 2016-09-07 NOTE — Progress Notes (Signed)
Anesthesia Chart Review: Patient is a 71 year old male scheduled for EVAR AAA on 09/08/16 by Dr. Curt Jews.  History includes smoking, HTN, AAA, GERD, chronic anxiety, LUL lung cancer (squamous cell carcinoma) 07/19/16, bladder cancer s/p TURBT 10/04/12, TCTS noted mention "fairly heavy daily alcohol use."  - PCP is Dr. [name]. - Pulmonologist is Dr. Marshell Garfinkel. - Cardiologist is Dr. Quay Burow. - HEM-ONC is Dr. Curt Bears. - CT surgeon is Dr. Lanelle Bal. He is in agreement to proceed with stenting of AAA prior to treating lung cancer, given the rapid increase in size of his AAA since 2014. - Urologist is Dr. Phebe Colla.  Meds include Xanax, amlodipine (takes inconsistently), atenolol, Klonopin, Flonase.  BP (!) 164/88   Pulse 78   Temp (!) 36.4 C (Oral)   Resp 18   Ht 5\' 10"  (1.778 m)   Wt 210 lb 1.6 oz (95.3 kg)   SpO2 97%   BMI 30.15 kg/m   EKG 07/03/16: NSR.  Cardiac cath 07/02/16:  The left ventricular systolic function is normal.  LV end diastolic pressure is normal.  The left ventricular ejection fraction is 55-65% by visual estimate.  1. Nonobstructive CAD (mild diffuse disease, severely calcified LAD, LCX; minimal luminal irregularities RCA) 2. Normal LV function 3. Normal LVEDP Plan: risk factor modification. I suspect his chest pain is more related to his pulmonary abnormality and cough.  CTA Chest/abd/pelvis 07/02/16: IMPRESSION: Chest Impression: 1. No evidence of thoracic aortic aneurysm or dissection. 2. Cavitary lesion in the LEFT upper lobe with thickened walls. Differential includes a pulmonary infection (consider fungal or mycobacterium infection) versus bronchogenic carcinoma (e.g. squamous cell carcinoma). Recommend bronchoscopy for further evaluation with sampling / microbiology. 3. No pulmonary embolism. Abdomen / Pelvis Impression: 1. No aortic dissection. 2. Infrarenal saccular abdominal aortic aneurysm measuring up to  59 mm is increased in size from 2014. No evidence of acute inflammation or hemorrhage. Vascular surgery consultation recommended due to increased risk of rupture for AAA >5.5 cm.  1V CXR 07/19/16: IMPRESSION: Increased interstitial density within the left lung may reflect bronchial lavage fluid or asymmetric pulmonary edema. No pneumothorax, significant atelectasis, or pleural effusion. Thoracic aortic atherosclerosis.  PFT's 08/23/16: FVC 3.53 (80%). FEV1 2.02 (63%). DLCO unc 20.58 (63%). Pulmonary Function Diagnosis: Moderate Obstructive Airways Disease Restriction -Probable Moderate Diffusion Defect  Preoperative labs noted. Na 128 (down from 133 on 08/09/16, 130 07/02/16), Cl 96. Glucose 109. Cr 0.65. Ca 8.8. AST 84 (stable when compared to 08/09/16 labs), ALT 33. Total bilirubin 1.5. H/H 14.5/45.1. PLT 176. PT/INR 14.8/1.15. PTT 38. T&S. Will recheck ISTAT4 to re-evaluate for hyponatremia. He was recently diagnosed with lung cancer, which could be a factor.   If follow-up labs stable and otherwise no acute changes then I would anticipate that he can proceed as planned.  George [facility] Short Stay Center/Anesthesiology Phone [phone] 09/07/2016 9:49 AM

## 2016-09-08 ENCOUNTER — Inpatient Hospital Stay (HOSPITAL_COMMUNITY): Payer: Medicare Other | Admitting: Vascular Surgery

## 2016-09-08 ENCOUNTER — Inpatient Hospital Stay (HOSPITAL_COMMUNITY): Payer: Medicare Other

## 2016-09-08 ENCOUNTER — Encounter (HOSPITAL_COMMUNITY): Payer: Self-pay | Admitting: *Deleted

## 2016-09-08 ENCOUNTER — Inpatient Hospital Stay (HOSPITAL_COMMUNITY)
Admission: RE | Admit: 2016-09-08 | Discharge: 2016-09-09 | DRG: 269 | Disposition: A | Discharge status: Home / Self Care | Payer: Medicare Other | Source: Ambulatory Visit | Attending: Vascular Surgery | Admitting: Vascular Surgery

## 2016-09-08 ENCOUNTER — Encounter (HOSPITAL_COMMUNITY)
Admission: RE | Disposition: A | Discharge status: Home / Self Care | Payer: Self-pay | Source: Ambulatory Visit | Attending: Vascular Surgery

## 2016-09-08 DIAGNOSIS — R079 Chest pain, unspecified: Secondary | ICD-10-CM | POA: Diagnosis not present

## 2016-09-08 DIAGNOSIS — F419 Anxiety disorder, unspecified: Secondary | ICD-10-CM | POA: Diagnosis present

## 2016-09-08 DIAGNOSIS — F17210 Nicotine dependence, cigarettes, uncomplicated: Secondary | ICD-10-CM | POA: Diagnosis present

## 2016-09-08 DIAGNOSIS — I119 Hypertensive heart disease without heart failure: Secondary | ICD-10-CM | POA: Diagnosis not present

## 2016-09-08 DIAGNOSIS — Z9889 Other specified postprocedural states: Secondary | ICD-10-CM

## 2016-09-08 DIAGNOSIS — I714 Abdominal aortic aneurysm, without rupture, unspecified: Secondary | ICD-10-CM | POA: Diagnosis present

## 2016-09-08 DIAGNOSIS — Z8551 Personal history of malignant neoplasm of bladder: Secondary | ICD-10-CM

## 2016-09-08 DIAGNOSIS — C3492 Malignant neoplasm of unspecified part of left bronchus or lung: Secondary | ICD-10-CM | POA: Diagnosis present

## 2016-09-08 DIAGNOSIS — R05 Cough: Secondary | ICD-10-CM | POA: Diagnosis not present

## 2016-09-08 DIAGNOSIS — K219 Gastro-esophageal reflux disease without esophagitis: Secondary | ICD-10-CM | POA: Diagnosis present

## 2016-09-08 DIAGNOSIS — I739 Peripheral vascular disease, unspecified: Secondary | ICD-10-CM | POA: Diagnosis present

## 2016-09-08 DIAGNOSIS — Z6831 Body mass index (BMI) 31.0-31.9, adult: Secondary | ICD-10-CM | POA: Diagnosis not present

## 2016-09-08 DIAGNOSIS — E669 Obesity, unspecified: Secondary | ICD-10-CM | POA: Diagnosis present

## 2016-09-08 DIAGNOSIS — I10 Essential (primary) hypertension: Secondary | ICD-10-CM | POA: Diagnosis present

## 2016-09-08 DIAGNOSIS — Z8249 Family history of ischemic heart disease and other diseases of the circulatory system: Secondary | ICD-10-CM

## 2016-09-08 HISTORY — PX: ABDOMINAL AORTIC ENDOVASCULAR STENT GRAFT: SHX5707

## 2016-09-08 LAB — POCT I-STAT 4, (NA,K, GLUC, HGB,HCT)
Glucose, Bld: 121 mg/dL — ABNORMAL HIGH (ref 65–99)
HEMATOCRIT: 48 % (ref 39.0–52.0)
HEMOGLOBIN: 16.3 g/dL (ref 13.0–17.0)
Potassium: 4.2 mmol/L (ref 3.5–5.1)
Sodium: 136 mmol/L (ref 135–145)

## 2016-09-08 LAB — CBC
HCT: 42.5 % (ref 39.0–52.0)
Hemoglobin: 14.1 g/dL (ref 13.0–17.0)
MCH: 34.8 pg — ABNORMAL HIGH (ref 26.0–34.0)
MCHC: 33.2 g/dL (ref 30.0–36.0)
MCV: 104.9 fL — ABNORMAL HIGH (ref 78.0–100.0)
PLATELETS: 210 10*3/uL (ref 150–400)
RBC: 4.05 MIL/uL — ABNORMAL LOW (ref 4.22–5.81)
RDW: 13.5 % (ref 11.5–15.5)
WBC: 7.4 10*3/uL (ref 4.0–10.5)

## 2016-09-08 LAB — BASIC METABOLIC PANEL
Anion gap: 6 (ref 5–15)
CALCIUM: 8.2 mg/dL — AB (ref 8.9–10.3)
CO2: 26 mmol/L (ref 22–32)
CREATININE: 0.68 mg/dL (ref 0.61–1.24)
Chloride: 102 mmol/L (ref 101–111)
GFR calc non Af Amer: >60 mL/min (ref >60)
Glucose, Bld: 130 mg/dL — ABNORMAL HIGH (ref 65–99)
Potassium: 4.2 mmol/L (ref 3.5–5.1)
SODIUM: 134 mmol/L — AB (ref 135–145)

## 2016-09-08 LAB — PROTIME-INR
INR: 1.27
PROTHROMBIN TIME: 16 s — AB (ref 11.4–15.2)

## 2016-09-08 LAB — MAGNESIUM: MAGNESIUM: 1.7 mg/dL (ref 1.7–2.4)

## 2016-09-08 LAB — APTT: aPTT: 40 seconds — ABNORMAL HIGH (ref 24–36)

## 2016-09-08 SURGERY — INSERTION, ENDOVASCULAR STENT GRAFT, AORTA, ABDOMINAL
Anesthesia: General

## 2016-09-08 MED ORDER — ONDANSETRON HCL 4 MG/2ML IJ SOLN: 4.0000 mg | Freq: Four times a day (QID) | INTRAMUSCULAR | Status: DC | PRN

## 2016-09-08 MED ORDER — ONDANSETRON HCL 4 MG/2ML IJ SOLN
INTRAMUSCULAR | Status: AC
  Filled 2016-09-08: qty 4

## 2016-09-08 MED ORDER — CLONAZEPAM 0.5 MG PO TABS
0.5000 mg | ORAL_TABLET | Freq: Two times a day (BID) | ORAL | Status: DC
  Administered 2016-09-08 – 2016-09-09 (×2): 0.5 mg via ORAL
  Filled 2016-09-08 (×2): qty 1

## 2016-09-08 MED ORDER — SUGAMMADEX SODIUM 200 MG/2ML IV SOLN
INTRAVENOUS | Status: DC | PRN
  Administered 2016-09-08: 200 mg via INTRAVENOUS

## 2016-09-08 MED ORDER — FLUTICASONE PROPIONATE 50 MCG/ACT NA SUSP
1.0000 | Freq: Every day | NASAL | Status: DC | PRN
  Filled 2016-09-08: qty 16

## 2016-09-08 MED ORDER — PROMETHAZINE HCL 25 MG/ML IJ SOLN: 6.2500 mg | INTRAMUSCULAR | Status: DC | PRN

## 2016-09-08 MED ORDER — SUCCINYLCHOLINE CHLORIDE 200 MG/10ML IV SOSY
PREFILLED_SYRINGE | INTRAVENOUS | Status: DC | PRN
  Administered 2016-09-08: 120 mg via INTRAVENOUS

## 2016-09-08 MED ORDER — ALPRAZOLAM 0.5 MG PO TABS: 0.5000 mg | ORAL_TABLET | Freq: Two times a day (BID) | ORAL | Status: DC

## 2016-09-08 MED ORDER — SODIUM CHLORIDE 0.9 % IV SOLN: 500.0000 mL | Freq: Once | INTRAVENOUS | Status: DC | PRN

## 2016-09-08 MED ORDER — POTASSIUM CHLORIDE CRYS ER 20 MEQ PO TBCR: 20.0000 meq | EXTENDED_RELEASE_TABLET | Freq: Every day | ORAL | Status: DC | PRN

## 2016-09-08 MED ORDER — ATENOLOL 25 MG PO TABS
50.0000 mg | ORAL_TABLET | Freq: Every day | ORAL | Status: DC
  Administered 2016-09-09: 50 mg via ORAL
  Filled 2016-09-08: qty 2

## 2016-09-08 MED ORDER — GUAIFENESIN-DM 100-10 MG/5ML PO SYRP
15.0000 mL | ORAL_SOLUTION | ORAL | Status: DC | PRN
Start: 2016-09-08 — End: 2016-09-09

## 2016-09-08 MED ORDER — BISACODYL 5 MG PO TBEC: 5.0000 mg | DELAYED_RELEASE_TABLET | Freq: Every day | ORAL | Status: DC | PRN

## 2016-09-08 MED ORDER — HYDRALAZINE HCL 20 MG/ML IJ SOLN: 5.0000 mg | INTRAMUSCULAR | Status: DC | PRN

## 2016-09-08 MED ORDER — FENTANYL CITRATE (PF) 100 MCG/2ML IJ SOLN
INTRAMUSCULAR | Status: DC | PRN
  Administered 2016-09-08 (×4): 50 ug via INTRAVENOUS

## 2016-09-08 MED ORDER — PROPOFOL 10 MG/ML IV BOLUS
INTRAVENOUS | Status: AC
  Filled 2016-09-08: qty 20

## 2016-09-08 MED ORDER — METOPROLOL TARTRATE 5 MG/5ML IV SOLN: 2.0000 mg | INTRAVENOUS | Status: DC | PRN

## 2016-09-08 MED ORDER — PROTAMINE SULFATE 10 MG/ML IV SOLN
INTRAVENOUS | Status: DC | PRN
  Administered 2016-09-08: 50 mg via INTRAVENOUS

## 2016-09-08 MED ORDER — LACTATED RINGERS IV SOLN
INTRAVENOUS | Status: DC | PRN
  Administered 2016-09-08: 08:00:00 via INTRAVENOUS

## 2016-09-08 MED ORDER — DEXAMETHASONE SODIUM PHOSPHATE 10 MG/ML IJ SOLN
INTRAMUSCULAR | Status: DC | PRN
  Administered 2016-09-08: 10 mg via INTRAVENOUS

## 2016-09-08 MED ORDER — SUGAMMADEX SODIUM 200 MG/2ML IV SOLN
INTRAVENOUS | Status: AC
  Filled 2016-09-08: qty 2

## 2016-09-08 MED ORDER — MEPERIDINE HCL 25 MG/ML IJ SOLN: 6.2500 mg | INTRAMUSCULAR | Status: DC | PRN

## 2016-09-08 MED ORDER — PHENYLEPHRINE 40 MCG/ML (10ML) SYRINGE FOR IV PUSH (FOR BLOOD PRESSURE SUPPORT)
PREFILLED_SYRINGE | INTRAVENOUS | Status: AC
  Filled 2016-09-08: qty 10

## 2016-09-08 MED ORDER — PHENYLEPHRINE 40 MCG/ML (10ML) SYRINGE FOR IV PUSH (FOR BLOOD PRESSURE SUPPORT)
PREFILLED_SYRINGE | INTRAVENOUS | Status: DC | PRN
  Administered 2016-09-08: 80 ug via INTRAVENOUS
  Administered 2016-09-08: 40 ug via INTRAVENOUS
  Administered 2016-09-08: 80 ug via INTRAVENOUS

## 2016-09-08 MED ORDER — MORPHINE SULFATE (PF) 2 MG/ML IV SOLN: 1.0000 mg | INTRAVENOUS | Status: DC | PRN

## 2016-09-08 MED ORDER — OXYCODONE-ACETAMINOPHEN 5-325 MG PO TABS
1.0000 | ORAL_TABLET | ORAL | Status: DC | PRN
  Administered 2016-09-08: 2 via ORAL
  Filled 2016-09-08: qty 2

## 2016-09-08 MED ORDER — LABETALOL HCL 5 MG/ML IV SOLN: 10.0000 mg | INTRAVENOUS | Status: DC | PRN

## 2016-09-08 MED ORDER — SENNOSIDES-DOCUSATE SODIUM 8.6-50 MG PO TABS: 1.0000 | ORAL_TABLET | Freq: Every evening | ORAL | Status: DC | PRN

## 2016-09-08 MED ORDER — DOCUSATE SODIUM 100 MG PO CAPS
100.0000 mg | ORAL_CAPSULE | Freq: Every day | ORAL | Status: DC
  Administered 2016-09-09: 100 mg via ORAL
  Filled 2016-09-08: qty 1

## 2016-09-08 MED ORDER — ALUM & MAG HYDROXIDE-SIMETH 200-200-20 MG/5ML PO SUSP: 15.0000 mL | ORAL | Status: DC | PRN

## 2016-09-08 MED ORDER — SODIUM CHLORIDE 0.9 % IV SOLN: INTRAVENOUS | Status: DC

## 2016-09-08 MED ORDER — PROPOFOL 10 MG/ML IV BOLUS
INTRAVENOUS | Status: DC | PRN
  Administered 2016-09-08: 140 mg via INTRAVENOUS

## 2016-09-08 MED ORDER — ACETAMINOPHEN 325 MG PO TABS: 325.0000 mg | ORAL_TABLET | ORAL | Status: DC | PRN

## 2016-09-08 MED ORDER — HEPARIN SODIUM (PORCINE) 1000 UNIT/ML IJ SOLN
INTRAMUSCULAR | Status: DC | PRN
  Administered 2016-09-08: 7000 [IU] via INTRAVENOUS

## 2016-09-08 MED ORDER — LACTATED RINGERS IV SOLN: INTRAVENOUS | Status: DC

## 2016-09-08 MED ORDER — PHENOL 1.4 % MT LIQD: 1.0000 | OROMUCOSAL | Status: DC | PRN

## 2016-09-08 MED ORDER — DEXTROSE 5 % IV SOLN
1.5000 g | INTRAVENOUS | Status: AC
  Administered 2016-09-08: 1.5 g via INTRAVENOUS
  Filled 2016-09-08: qty 1.5

## 2016-09-08 MED ORDER — FENTANYL CITRATE (PF) 250 MCG/5ML IJ SOLN
INTRAMUSCULAR | Status: AC
  Filled 2016-09-08: qty 5

## 2016-09-08 MED ORDER — LACTATED RINGERS IV SOLN
INTRAVENOUS | Status: DC | PRN
  Administered 2016-09-08 (×2): via INTRAVENOUS

## 2016-09-08 MED ORDER — AMLODIPINE BESYLATE 5 MG PO TABS
2.5000 mg | ORAL_TABLET | Freq: Every day | ORAL | Status: DC
  Administered 2016-09-09: 2.5 mg via ORAL
  Filled 2016-09-08: qty 1

## 2016-09-08 MED ORDER — 0.9 % SODIUM CHLORIDE (POUR BTL) OPTIME
TOPICAL | Status: DC | PRN
  Administered 2016-09-08: 1000 mL

## 2016-09-08 MED ORDER — ROCURONIUM BROMIDE 10 MG/ML (PF) SYRINGE
PREFILLED_SYRINGE | INTRAVENOUS | Status: AC
  Filled 2016-09-08: qty 5

## 2016-09-08 MED ORDER — HYDROMORPHONE HCL 1 MG/ML IJ SOLN
INTRAMUSCULAR | Status: AC
  Administered 2016-09-08: 0.5 mg via INTRAVENOUS
  Filled 2016-09-08: qty 1

## 2016-09-08 MED ORDER — ACETAMINOPHEN 650 MG RE SUPP: 325.0000 mg | RECTAL | Status: DC | PRN

## 2016-09-08 MED ORDER — PANTOPRAZOLE SODIUM 40 MG PO TBEC
40.0000 mg | DELAYED_RELEASE_TABLET | Freq: Every day | ORAL | Status: DC
  Administered 2016-09-08 – 2016-09-09 (×2): 40 mg via ORAL
  Filled 2016-09-08 (×2): qty 1

## 2016-09-08 MED ORDER — DEXTROSE 5 % IV SOLN
1.5000 g | Freq: Two times a day (BID) | INTRAVENOUS | Status: AC
  Administered 2016-09-08 – 2016-09-09 (×2): 1.5 g via INTRAVENOUS
  Filled 2016-09-08 (×2): qty 1.5

## 2016-09-08 MED ORDER — MAGNESIUM SULFATE 2 GM/50ML IV SOLN
2.0000 g | Freq: Every day | INTRAVENOUS | Status: DC | PRN
  Filled 2016-09-08: qty 50

## 2016-09-08 MED ORDER — HEPARIN SODIUM (PORCINE) 5000 UNIT/ML IJ SOLN
INTRAMUSCULAR | Status: DC | PRN
  Administered 2016-09-08: 09:00:00

## 2016-09-08 MED ORDER — LIDOCAINE 2% (20 MG/ML) 5 ML SYRINGE
INTRAMUSCULAR | Status: DC | PRN
  Administered 2016-09-08: 60 mg via INTRAVENOUS

## 2016-09-08 MED ORDER — MIDAZOLAM HCL 5 MG/5ML IJ SOLN
INTRAMUSCULAR | Status: DC | PRN
  Administered 2016-09-08: 2 mg via INTRAVENOUS

## 2016-09-08 MED ORDER — ONDANSETRON HCL 4 MG/2ML IJ SOLN
INTRAMUSCULAR | Status: DC | PRN
  Administered 2016-09-08: 4 mg via INTRAVENOUS

## 2016-09-08 MED ORDER — LIDOCAINE 2% (20 MG/ML) 5 ML SYRINGE
INTRAMUSCULAR | Status: AC
  Filled 2016-09-08: qty 10

## 2016-09-08 MED ORDER — SUCCINYLCHOLINE CHLORIDE 200 MG/10ML IV SOSY
PREFILLED_SYRINGE | INTRAVENOUS | Status: AC
  Filled 2016-09-08: qty 10

## 2016-09-08 MED ORDER — PHENYLEPHRINE HCL 10 MG/ML IJ SOLN
INTRAMUSCULAR | Status: DC | PRN
  Administered 2016-09-08: 25 ug/min via INTRAVENOUS

## 2016-09-08 MED ORDER — ROCURONIUM BROMIDE 10 MG/ML (PF) SYRINGE
PREFILLED_SYRINGE | INTRAVENOUS | Status: DC | PRN
  Administered 2016-09-08: 50 mg via INTRAVENOUS

## 2016-09-08 MED ORDER — SODIUM CHLORIDE 0.9 % IV SOLN
INTRAVENOUS | Status: DC
  Administered 2016-09-08 – 2016-09-09 (×2): via INTRAVENOUS

## 2016-09-08 MED ORDER — MIDAZOLAM HCL 2 MG/2ML IJ SOLN
INTRAMUSCULAR | Status: AC
Start: 2016-09-08 — End: ?
  Filled 2016-09-08: qty 2

## 2016-09-08 MED ORDER — ENOXAPARIN SODIUM 40 MG/0.4ML ~~LOC~~ SOLN: 40.0000 mg | SUBCUTANEOUS | Status: DC

## 2016-09-08 MED ORDER — DEXAMETHASONE SODIUM PHOSPHATE 10 MG/ML IJ SOLN
INTRAMUSCULAR | Status: AC
  Filled 2016-09-08: qty 1

## 2016-09-08 MED ORDER — IODIXANOL 320 MG/ML IV SOLN
INTRAVENOUS | Status: DC | PRN
Start: 2016-09-08 — End: 2016-09-08
  Administered 2016-09-08: 150 mL via INTRAVENOUS

## 2016-09-08 MED ORDER — SODIUM CHLORIDE 0.9 % IJ SOLN
INTRAMUSCULAR | Status: AC
  Filled 2016-09-08: qty 20

## 2016-09-08 MED ORDER — IBUPROFEN 400 MG PO TABS: 400.0000 mg | ORAL_TABLET | Freq: Every day | ORAL | Status: DC | PRN

## 2016-09-08 MED ORDER — HYDROMORPHONE HCL 1 MG/ML IJ SOLN
0.2500 mg | INTRAMUSCULAR | Status: DC | PRN
  Administered 2016-09-08 (×3): 0.5 mg via INTRAVENOUS

## 2016-09-08 SURGICAL SUPPLY — 46 items
CANISTER SUCT 3000ML PPV (MISCELLANEOUS) ×2 IMPLANT
CATH ANGIO 5F BER2 65CM (CATHETERS) ×2 IMPLANT
CATH OMNI FLUSH .035X70CM (CATHETERS) ×2 IMPLANT
CLIP LIGATING EXTRA MED SLVR (CLIP) IMPLANT
CLIP LIGATING EXTRA SM BLUE (MISCELLANEOUS) IMPLANT
COVER PROBE W GEL 5X96 (DRAPES) ×2 IMPLANT
DERMABOND ADVANCED (GAUZE/BANDAGES/DRESSINGS) ×2
DERMABOND ADVANCED .7 DNX12 (GAUZE/BANDAGES/DRESSINGS) ×2 IMPLANT
DEVICE CLOSURE PERCLS PRGLD 6F (VASCULAR PRODUCTS) ×5 IMPLANT
DRSG TEGADERM 2-3/8X2-3/4 SM (GAUZE/BANDAGES/DRESSINGS) ×4 IMPLANT
ELECT REM PT RETURN 9FT ADLT (ELECTROSURGICAL) ×4
ELECTRODE REM PT RTRN 9FT ADLT (ELECTROSURGICAL) ×2 IMPLANT
EXCLUDER TNK LEG 23MX12X14 (Endovascular Graft) ×1 IMPLANT
EXCLUDER TRUNK LEG 23MX12X14 (Endovascular Graft) ×2 IMPLANT
GAUZE SPONGE 2X2 8PLY NS (GAUZE/BANDAGES/DRESSINGS) ×4 IMPLANT
GLOVE SS BIOGEL STRL SZ 7.5 (GLOVE) ×1 IMPLANT
GLOVE SUPERSENSE BIOGEL SZ 7.5 (GLOVE) ×1
GOWN STRL REUS W/ TWL LRG LVL3 (GOWN DISPOSABLE) ×3 IMPLANT
GOWN STRL REUS W/TWL LRG LVL3 (GOWN DISPOSABLE) ×3
GRAFT BALLN CATH 65CM (STENTS) ×1 IMPLANT
KIT BASIN OR (CUSTOM PROCEDURE TRAY) ×2 IMPLANT
KIT ROOM TURNOVER OR (KITS) ×2 IMPLANT
LEG CONTRALATERAL 18X11.5 (Endovascular Graft) ×2 IMPLANT
NEEDLE PERC 18GX7CM (NEEDLE) ×2 IMPLANT
NS IRRIG 1000ML POUR BTL (IV SOLUTION) ×2 IMPLANT
PACK ENDOVASCULAR (PACKS) ×2 IMPLANT
PAD ARMBOARD 7.5X6 YLW CONV (MISCELLANEOUS) ×4 IMPLANT
PERCLOSE PROGLIDE 6F (VASCULAR PRODUCTS) ×10
SHEATH AVANTI 11CM 8FR (MISCELLANEOUS) IMPLANT
SHEATH BRITE TIP 8FR 23CM (MISCELLANEOUS) ×2 IMPLANT
SHEATH PINNACLE 8F 10CM (SHEATH) ×2 IMPLANT
STAPLER VISISTAT 35W (STAPLE) IMPLANT
STENT GRAFT BALLN CATH 65CM (STENTS) ×1
STOPCOCK MORSE 400PSI 3WAY (MISCELLANEOUS) ×2 IMPLANT
SUT ETHILON 3 0 PS 1 (SUTURE) IMPLANT
SUT PROLENE 5 0 C 1 24 (SUTURE) IMPLANT
SUT VIC AB 2-0 CTX 36 (SUTURE) IMPLANT
SUT VIC AB 3-0 SH 18 (SUTURE) IMPLANT
SUT VIC AB 3-0 SH 27 (SUTURE)
SUT VIC AB 3-0 SH 27X BRD (SUTURE) IMPLANT
SUT VICRYL 4-0 PS2 18IN ABS (SUTURE) ×4 IMPLANT
SYR 30ML LL (SYRINGE) ×2 IMPLANT
TRAY FOLEY W/METER SILVER 16FR (SET/KITS/TRAYS/PACK) ×2 IMPLANT
TUBING HIGH PRESSURE 120CM (CONNECTOR) ×2 IMPLANT
WIRE AMPLATZ SS-J .035X180CM (WIRE) ×4 IMPLANT
WIRE BENTSON .035X145CM (WIRE) ×4 IMPLANT

## 2016-09-08 NOTE — Anesthesia Procedure Notes (Signed)
Arterial Line Insertion Start/End8/02/2016 7:50 AM, 09/08/2016 7:55 AM Performed by: Verdie Drown, CRNA  Patient location: Pre-op. Preanesthetic checklist: patient identified, IV checked, site marked, risks and benefits discussed, surgical consent, monitors and equipment checked, pre-op evaluation and timeout performed Lidocaine 1% used for infiltration Right, radial was placed Catheter size: 20 G Hand hygiene performed  and maximum sterile barriers used  Allen's test indicative of satisfactory collateral circulation Attempts: 1 Procedure performed without using ultrasound guided technique. Following insertion, Biopatch. Post procedure assessment: normal  Patient tolerated the procedure well with no immediate complications.

## 2016-09-08 NOTE — Progress Notes (Signed)
Patient states that when he arrived to short stay this AM, his belongings were placed into a plastic white bag. Patient and wife states bag contents included; sweatpants, underwear, t-shirt, v-neck jacket, and brown shoes. Belongings did not arrive to 4E12 with patient from PACU this afternoon. PACU and Short Stay were called and stated that there were no patient belongings left in those areas. Report to be passed on to next nurse regarding belongings for further investigation.

## 2016-09-08 NOTE — Transfer of Care (Signed)
Immediate Anesthesia Transfer of Care Note  Patient: Tyler Mcmillan  Procedure(s) Performed: Procedure(s): ABDOMINAL AORTIC ENDOVASCULAR STENT GRAFT (N/A)  Patient Location: PACU  Anesthesia Type:General  Level of Consciousness: awake, alert  and oriented  Airway & Oxygen Therapy: Patient Spontanous Breathing and Patient connected to nasal cannula oxygen  Post-op Assessment: Report given to RN, Post -op Vital signs reviewed and stable and Patient moving all extremities X 4  Post vital signs: Reviewed and stable  Last Vitals:  Vitals:   09/08/16 0715 09/08/16 1040  BP: (!) 154/76 117/61  Pulse: 75 82  Resp: 18 (!) 23  Temp: 36.8 C (P) 36.7 C    Last Pain:  Vitals:   09/08/16 0715  TempSrc: Oral  PainSc: 3       Patients Stated Pain Goal: 3 (62/95/28 4132)  Complications: No apparent anesthesia complications

## 2016-09-08 NOTE — Discharge Instructions (Signed)
° °  [facility] of [facility]   Discharge Instructions  Endovascular Aortic Aneurysm Repair  Please refer to the following instructions for your post-procedure care. Your surgeon or Physician Assistant will discuss any changes with you.  Activity  You are encouraged to walk as much as you can. You can slowly return to normal activities but must avoid strenuous activity and heavy lifting until your doctor tells you it's OK. Avoid activities such as vacuuming or swinging a gold club. It is normal to feel tired for several weeks after your surgery. Do not drive until your doctor gives the OK and you are no longer taking prescription pain medications. It is also normal to have difficulty with sleep habits, eating, and bowel movements after surgery. These will go away with time.  Bathing/Showering  You may shower after you go home. If you have an incision, do not soak in a bathtub, hot tub, or swim until the incision heals completely.  If you have incisions in your groin, sash the groin wounds with soap and water daily and pat dry. (No tub bath-only shower)  Then put a dry gauze or washcloth there to keep this area dry to help prevent wound infection daily and as needed.  Do not use Vaseline or neosporin on your incisions.  Only use soap and water on your incisions and then protect and keep dry.  Incision Care  Shower every day. Clean your incision with mild soap and water. Pat the area dry with a clean towel. You do not need a bandage unless otherwise instructed. Do not apply any ointments or creams to your incision. If you clothing is irritating, you may cover your incision with a dry gauze pad.  Diet  Resume your normal diet. There are no special food restrictions following this procedure. A low fat/low cholesterol diet is recommended for all patients with vascular disease. In order to heal from your surgery, it is CRITICAL to get adequate nutrition. Your body requires  vitamins, minerals, and protein. Vegetables are the best source of vitamins and minerals. Vegetables also provide the perfect balance of protein. Processed food has little nutritional value, so try to avoid this.  Medications  Resume taking all of your medications unless your doctor or nurse practitioner tells you not to. If your incision is causing pain, you may take over-the-counter pain relievers such as acetaminophen (Tylenol). If you were prescribed a stronger pain medication, please be aware these medications can cause nausea and constipation. Prevent nausea by taking the medication with a snack or meal. Avoid constipation by drinking plenty of fluids and eating foods with a high amount of fiber, such as fruits, vegetables, and grains. Do not take Tylenol if you are taking prescription pain medications.   Follow up  [place] office will schedule a follow-up appointment with a C.T. scan 3-4 weeks after your surgery.  Please call us immediately for any of the following conditions  Severe or worsening pain in your legs or feet or in your abdomen back or chest. Increased pain, redness, drainage (pus) from your incision sit. Increased abdominal pain, bloating, nausea, vomiting or persistent diarrhea. Fever of 101 degrees or higher. Swelling in your leg (s),  Reduce your risk of vascular disease  Stop smoking. If you would like help call [facility] at 1-800-QUIT-NOW (516) 595-3008) or [place] at [phone]. Manage your cholesterol Maintain a desired weight Control your diabetes Keep your blood pressure down  If you have questions, please call the office at [phone].

## 2016-09-08 NOTE — Progress Notes (Signed)
  Day of Surgery Note    Subjective:  Resting and awakes to voice.  Says he is a little sore in the left groin   Vitals:   09/08/16 1125 09/08/16 1140  BP: 127/69 124/71  Pulse: 78 78  Resp: 18 13  Temp:      Incisions:   Bilateral groins are soft without hematoma Extremities:  Easily palpable DP pulses bilaterally Cardiac:  regular Lungs:  Non labored Abdomen:  Soft, NT/ND   Assessment/Plan:  This is a 71 y.o. male who is s/p stent graft repair of AAA  -pt doing well in recovery -he has palpable DP pulses bilaterally -to 4 east when bed available -most likely discharge tomorrow.   Leontine Locket, PA-C 09/08/2016 12:00 PM [phone]

## 2016-09-08 NOTE — H&P (Signed)
Consult Note Date of Service: 07/03/2016 10:37 AM  Gardner Servantes, Arvilla Meres, MD  Vascular Surgery    Hide copied text Hover for attribution information   [facility] of [facility]  Patient name: Tyler Mcmillan MRN: [id number] DOB: December 07, 1945 Sex: male  REASON FOR VISIT: Abdominal aortic aneurysm with some enlargement  HPI:  Tyler Mcmillan is a 71 y.o. male I had seen as an outpatient 2 years ago. At that time he had a moderate size abdominal aortic aneurysm by ultrasound. This was less than 5 cm and he was to see [place] back for ongoing surveillance. He was lost to follow-up. He presented to the emergency room yesterday with chest pain and workup included CT of chest and abdomen. CT of his chest revealed cavitary lesion which will be worked up by pulmonary. He was found to have enlargement of his aneurysm now to 5.9 cm with no evidence of rupture. He also underwent a cardiac catheterization yesterday which showed no evidence of coronary disease. He has no symptoms referable to his aneurysm.       Past Medical History:  Diagnosis Date  . AAA (abdominal aortic aneurysm) without rupture ([place])    [place] ABD. DONE JUNE 2014 3.5  . Bladder neoplasm   . Chronic anxiety   . GERD (gastroesophageal reflux disease)   . Hypertension            Family History   Problem Relation Age of Onset   . Cancer Mother    . Heart disease Father     before age 26   . Heart attack Father    . Cancer Brother     SOCIAL HISTORY:            Social History   Substance Use Topics   . Smoking status: Current Every Day Smoker     Packs/day: 0.50     Years: 50.00     Types: Cigarettes   . Smokeless tobacco: Never Used   . Alcohol use 14.4 oz/week      10 Glasses of wine, 14 Cans of beer per week       Comment: few glases wine per day    No Known Allergies              Current Facility-Administered Medications   Medication Dose Route Frequency Provider Last Rate Last Dose   . 0.9 % sodium  chloride infusion 250 mL Intravenous PRN [place], Peter M, MD     . acetaminophen (TYLENOL) tablet 650 mg 650 mg Oral Q4H PRN Elwin Mocha, MD     . amLODipine (NORVASC) tablet 2.5 mg 2.5 mg Oral Daily Dorothy Spark, MD     . aspirin EC tablet 325 mg 325 mg Oral Daily Elwin Mocha, MD  325 mg at 07/03/16 5465   . atenolol (TENORMIN) tablet 50 mg 50 mg Oral q morning - 10a Elwin Mocha, MD  50 mg at 07/03/16 6812   . clonazePAM (KLONOPIN) tablet 0.5 mg 0.5 mg Oral BID PRN Elwin Mocha, MD  0.5 mg at 07/03/16 7517   . dextrose 5 % and 0.45 % NaCl with KCl 20 mEq/L infusion  Intravenous Continuous Elwin Mocha, MD 75 mL/hr at 07/03/16 0707    . heparin injection 5,000 Units 5,000 Units Subcutaneous Q8H [place], Peter M, MD  5,000 Units at 07/03/16 [phone]   . morphine 4 MG/ML injection 2  mg 2 mg Intravenous Q2H PRN Elwin Mocha, MD     . nitroGLYCERIN (NITROSTAT) SL tablet 0.4 mg 0.4 mg Sublingual Q5 min PRN Pattricia Boss, MD  0.4 mg at 07/02/16 1041   . ondansetron (ZOFRAN) injection 4 mg 4 mg Intravenous Q6H PRN Elwin Mocha, MD     . pantoprazole (PROTONIX) EC tablet 40 mg 40 mg Oral Daily Elwin Mocha, MD     . sodium chloride flush (NS) 0.9 % injection 3 mL 3 mL Intravenous Q12H [place], Peter M, MD     . sodium chloride flush (NS) 0.9 % injection 3 mL 3 mL Intravenous PRN [place], Peter M, MD      REVIEW OF SYSTEMS:  [X]  denotes positive finding, [ ]  denotes negative finding    Cardiac  Comments:   Chest pain or chest pressure: x    Shortness of breath upon exertion:     Short of breath when lying flat:     Irregular heart rhythm:          Vascular     Pain in calf, thigh, or hip brought on by ambulation:     Pain in feet at night that wakes you up from your sleep:      Blood clot in your veins:     Leg swelling:           PHYSICAL EXAM:           Vitals:    07/02/16 2300 07/02/16 2345 07/03/16 0046 07/03/16 0451   BP: 140/77 137/73 (!) 152/82 (!)  141/75   Pulse: 90 91 91 85   Resp:  18  17   Temp:  98.8 F (37.1 C)  99.1 F (37.3 C)   TempSrc:  Oral  Oral   SpO2:  95%  93%   Weight:       Height:        GENERAL: The patient is a well-nourished male, in no acute distress. The vital signs are documented above.  CARDIOVASCULAR: 2+ radial 2+ popliteal and 2+ dorsalis pedis pulses bilaterally.  Abdomen soft with moderate obesity. No aneurysm palpable  PULMONARY: There is good air exchange  MUSCULOSKELETAL: There are no major deformities or cyanosis.  NEUROLOGIC: No focal weakness or paresthesias are detected.  SKIN: There are no ulcers or rashes noted.  PSYCHIATRIC: The patient has a normal affect.  DATA:  CT scan was reviewed. This does show enlargement of 5.9 cm. No evidence of leak  MEDICAL ISSUES:  I discussed this at length with the patient. I have recommended elective repair. Would the defer elective repair until the workup of his lung lesion is complete. Assuming no life-threatening issue is found, would recommend elective repair in the near future. He does appear to be a candidate of stent graft repair and discussed this and open repair with the patient. Will continue to follow pulmonary status and workup. We will plan elective aneurysm repair after this is been resolved  [name], MD [facility]  [facility] of [facility] [phone]  Pager [phone]    Electronically signed by [name], MD at 07/03/2016 10:40 AM          Addendum:  The patient has been re-examined and re-evaluated.  The patient's history and physical has been reviewed and is unchanged.    Tyler Mcmillan is a 71 y.o. male is being admitted with Abdominal Aortic Aneurysm  I71.4. All the  risks, benefits and other treatment options have been discussed with the patient. The patient has consented to proceed with Procedure(s): ABDOMINAL AORTIC ENDOVASCULAR STENT GRAFT as a surgical intervention.  Curt Jews 09/08/2016 8:01 AM Vascular and Vein Surgery

## 2016-09-08 NOTE — Anesthesia Procedure Notes (Signed)
Procedure Name: Intubation Date/Time: 09/08/2016 8:41 AM Performed by: Merrilyn Puma B Pre-anesthesia Checklist: Patient identified, Emergency Drugs available, Suction available, Patient being monitored and Timeout performed Patient Re-evaluated:Patient Re-evaluated prior to induction Oxygen Delivery Method: Circle system utilized Preoxygenation: Pre-oxygenation with 100% oxygen Induction Type: IV induction Ventilation: Mask ventilation without difficulty and Oral airway inserted - appropriate to patient size Grade View: Grade I Tube type: Oral Tube size: 7.5 mm Number of attempts: 1 Airway Equipment and Method: Stylet and Video-laryngoscopy Placement Confirmation: ETT inserted through vocal cords under direct vision,  positive ETCO2,  CO2 detector and breath sounds checked- equal and bilateral Secured at: 23 cm Tube secured with: Tape Dental Injury: Teeth and Oropharynx as per pre-operative assessment  Difficulty Due To: Difficult Airway- due to limited oral opening and Difficulty was anticipated

## 2016-09-08 NOTE — Anesthesia Postprocedure Evaluation (Signed)
Anesthesia Post Note  Patient: Tyler Mcmillan  Procedure(s) Performed: Procedure(s) (LRB): ABDOMINAL AORTIC ENDOVASCULAR STENT GRAFT (N/A)     Patient location during evaluation: PACU Anesthesia Type: General Level of consciousness: awake and alert Pain management: pain level controlled Vital Signs Assessment: post-procedure vital signs reviewed and stable Respiratory status: spontaneous breathing, nonlabored ventilation, respiratory function stable and patient connected to nasal cannula oxygen Cardiovascular status: blood pressure returned to baseline and stable Postop Assessment: no signs of nausea or vomiting Anesthetic complications: no    Last Vitals:  Vitals:   09/08/16 1528 09/08/16 1546  BP: 132/71 (!) 143/79  Pulse: 74 75  Resp: 17 14  Temp: 36.7 C 37.1 C    Last Pain:  Vitals:   09/08/16 1546  TempSrc: Oral  PainSc: 4     LLE Motor Response: (P) Purposeful movement;Responds to commands (09/08/16 1546) LLE Sensation: (P) Full sensation (09/08/16 1546) RLE Motor Response: (P) Purposeful movement;Responds to commands (09/08/16 1546) RLE Sensation: (P) Full sensation (09/08/16 1546)      Effie Berkshire

## 2016-09-08 NOTE — Op Note (Signed)
    OPERATIVE REPORT  DATE OF SURGERY: 09/08/2016  PATIENT: Tyler Mcmillan, 71 y.o. male MRN: [id number]  DOB: [birth year]  PRE-OPERATIVE DIAGNOSIS: Abdominal aortic aneurysm  POST-OPERATIVE DIAGNOSIS:  Same  PROCEDURE: Stent graft repair of abdominal aortic aneurysm  SURGEON:  Curt Jews, M.D.  PHYSICIAN ASSISTANT: Gerri Lins PA-C  ANESTHESIA:  Gen.  EBL: 100 ml  Total I/O In: 1200 [I.V.:1200] Out: 300 [Urine:200; Blood:100]  BLOOD ADMINISTERED: None  DRAINS: None  SPECIMEN: None  COUNTS CORRECT:  YES  PLAN OF CARE: PACU   PATIENT DISPOSITION:  PACU - hemodynamically stable  PROCEDURE DETAILS: Patient was taken to the operative placed supine position where the area both groins prepped in usual sterile fashion. Using SonoSite ultrasound the common femoral artery was accessed bilaterally with an 18-gauge needle. 2 separate Perclose devices were positioned at the 10:00 and 2:00 position and were partially deployed left in place for closure at the end of the case. 8 French sheaths were placed over the guidewires bilaterally. Using a Bentson Bentson wire and a better guiding catheter the catheter was positioned in the level of the suprarenal aorta bilaterally. The Bentson wire was exchanged for Amplatz superstiff wires bilaterally. A extend free [place] catheter was positioned to the left groin and a 12 French sheath was passed through the right groin. The patient was given 7000 units intravenous heparin. The main body device was a 23 x 12 x 14 and this was positioned in a crosslegged position and placed in the through the left femoral sheath up to the level of the renal arteries. Pigtail catheter is positioned through the right groin to the suprarenal area and that arteriogram was obtained revealing the level of the renal artery takeoffs. The main portion main trunk was deployed just below the level renal arteries. Repeat injection of contrast confirmed this was a good location  just directly below the left renal artery takeoff. The pigtail catheter was then removed down into the aneurysm sac. The bare guiding catheter again was used to cannulate the contralateral gate and this was confirmed by replacing the pigtail catheter in the main body and suspending it to confirm that this was indeed in the main body. Amplatz superstiff wire was placed through the pigtail catheter pigtail catheter was removed. Hand injection through the right femoral sheath revealed the level of the hypogastric artery takeoff. A 18 x 11 half centimeter contralateral limb was chosen and was position with the appropriate 2 cm overlap in the main body endplate 11 just above the level of the hypogastric takeoff. This was deployed. Finally the ipsilateral limb was deployed from the main body device. Acute 50 balloon was used to gently dilate call overlap joints and also the proximal and distal junctions. Pigtail catheter was repositioned above the level the renal arteries and a final image revealed excellent positioning with no evidence of type I endoleak. There was a late type II endoleak related to lumbar vessels. The Amplatz wires were exchanged for a Bentson wires. The sheaths were removed and the prior placed Perclose devices were secured giving excellent hemostasis. The patient was given 50 mg of protamine to reverse the heparin. The stab incisions the groin were closed with single 4 subcuticular Vicryl suture. The patient had palpable dorsalis pedis pulses was transferred to the recovery room in stable fashion   [name], M.D., [facility] 09/08/2016 10:56 AM

## 2016-09-09 ENCOUNTER — Other Ambulatory Visit: Payer: Self-pay

## 2016-09-09 ENCOUNTER — Encounter (HOSPITAL_COMMUNITY): Payer: Self-pay | Admitting: Vascular Surgery

## 2016-09-09 ENCOUNTER — Other Ambulatory Visit: Payer: Self-pay | Admitting: *Deleted

## 2016-09-09 DIAGNOSIS — Z95828 Presence of other vascular implants and grafts: Secondary | ICD-10-CM

## 2016-09-09 LAB — BASIC METABOLIC PANEL
ANION GAP: 8 (ref 5–15)
BUN: 6 mg/dL (ref 6–20)
CALCIUM: 8.2 mg/dL — AB (ref 8.9–10.3)
CO2: 24 mmol/L (ref 22–32)
Chloride: 105 mmol/L (ref 101–111)
Creatinine, Ser: 0.77 mg/dL (ref 0.61–1.24)
GFR calc Af Amer: >60 mL/min (ref >60)
GFR calc non Af Amer: >60 mL/min (ref >60)
GLUCOSE: 126 mg/dL — AB (ref 65–99)
POTASSIUM: 4.5 mmol/L (ref 3.5–5.1)
Sodium: 137 mmol/L (ref 135–145)

## 2016-09-09 LAB — CBC
HEMATOCRIT: 43.7 % (ref 39.0–52.0)
Hemoglobin: 14.3 g/dL (ref 13.0–17.0)
MCH: 34.8 pg — ABNORMAL HIGH (ref 26.0–34.0)
MCHC: 32.7 g/dL (ref 30.0–36.0)
MCV: 106.3 fL — AB (ref 78.0–100.0)
Platelets: 189 10*3/uL (ref 150–400)
RBC: 4.11 MIL/uL — ABNORMAL LOW (ref 4.22–5.81)
RDW: 13.4 % (ref 11.5–15.5)
WBC: 9.1 10*3/uL (ref 4.0–10.5)

## 2016-09-09 LAB — TYPE AND SCREEN
ABO/RH(D): AB POS
Antibody Screen: NEGATIVE
Unit division: 0
Unit division: 0

## 2016-09-09 LAB — BPAM RBC
BLOOD PRODUCT EXPIRATION DATE: 201808232359
Blood Product Expiration Date: 201808232359
UNIT TYPE AND RH: 8400
Unit Type and Rh: 8400

## 2016-09-09 MED ORDER — ALPRAZOLAM 0.5 MG PO TABS: 0.5000 mg | ORAL_TABLET | Freq: Two times a day (BID) | ORAL | Status: DC | PRN

## 2016-09-09 MED ORDER — OXYCODONE-ACETAMINOPHEN 5-325 MG PO TABS: 1.0000 | ORAL_TABLET | Freq: Four times a day (QID) | ORAL | 0 refills | Status: DC | PRN

## 2016-09-09 NOTE — Care Management Note (Signed)
Case Management Note Marvetta Gibbons RN, BSN Unit 4E-Case Manager [phone]  Patient Details  Name: Masoud Nyce MRN: [id number] Date of Birth: 06/26/1945  Subjective/Objective:   Pt admitted s/p AAA repair with sent                 Action/Plan: PTA pt lived at home - plan to return home- no CM needs noted for discharge.   Expected Discharge Date:  09/09/16               Expected Discharge Plan:  Home/Self Care  In-House Referral:     Discharge planning Services  CM Consult  Post Acute Care Choice:  NA Choice offered to:  NA  DME Arranged:    DME Agency:     HH Arranged:    HH Agency:     Status of Service:  Completed, signed off  If discussed at [place] of Stay Meetings, dates discussed:    Discharge Disposition: home/self care   Additional Comments:  Dawayne Patricia, RN 09/09/2016, 11:48 AM

## 2016-09-09 NOTE — Progress Notes (Signed)
D/C foley and A line per order. Pt tolerated Well. Will continue to monitor. Isac Caddy, RN

## 2016-09-09 NOTE — Progress Notes (Addendum)
[facility]   Subjective  - Very anxious, worried about lung cancer.   Objective 137/66 77 97.7 F (36.5 C) (Oral) 14 94%  Intake/Output Summary (Last 24 hours) at 09/09/16 0735 Last data filed at 09/09/16 3710  Gross per 24 hour  Intake          3380.41 ml  Output             2925 ml  Net           455.41 ml   B LE edema, feet warm and well perfused, sensation and active range of motion intact. Groin soft without hematoma, min ecchymosis Lungs non labored breathing Heart RRR   Assessment/Planning: POD # 1 EVAR  He is ambulating and tolerating PO's.  He has not voided since the foley was D/C'd.  Once he independently voids he can be discharged home. F/U in 4 weeks with CTA AB/pelvis  Laurence Slate [facility] 09/09/2016 7:35 AM --  Laboratory Lab Results:  Recent Labs  09/08/16 1050 09/09/16 0220  WBC 7.4 9.1  HGB 14.1 14.3  HCT 42.5 43.7  PLT 210 189   BMET  Recent Labs  09/08/16 1136 09/09/16 0220  NA 134* 137  K 4.2 4.5  CL 102 105  CO2 26 24  GLUCOSE 130* 126*  BUN <5* 6  CREATININE 0.68 0.77  CALCIUM 8.2* 8.2*    COAG Lab Results  Component Value Date   INR 1.27 09/08/2016   INR 1.15 09/06/2016   No results found for: PTT  I have examined the patient, reviewed and agree with above.  Curt Jews, MD 09/09/2016 9:22 AM

## 2016-09-10 NOTE — Discharge Summary (Signed)
[facility] Discharge Summary   Patient ID:  Tyler Mcmillan MRN: [id number] DOB/AGE: May 06, 1945 71 y.o.  Admit date: 09/08/2016 Discharge date: 09/09/2016 Date of Surgery: 09/08/2016 Surgeon: Juliann Mule): Early, Arvilla Meres, MD  Admission Diagnosis: Abdominal Aortic Aneurysm  I71.4  Discharge Diagnoses:  Abdominal Aortic Aneurysm  I71.4  Secondary Diagnoses: Past Medical History:  Diagnosis Date  . AAA (abdominal aortic aneurysm) without rupture ([place])    [place] ABD.  DONE  JUNE 2014  3.5  . Bladder cancer ([place])   . Bladder neoplasm   . Chronic anxiety   . GERD (gastroesophageal reflux disease)   . Hypertension   . 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 male I had seen as an outpatient 2 years ago. At that time he had a moderate size abdominal aortic aneurysm by ultrasound. This was less than 5 cm and he was to see [place] back for ongoing surveillance. He was lost to follow-up. He presented to the emergency room yesterday with chest pain and workup included CT of chest and abdomen. CT of his chest revealed cavitary lesion which will be worked up by pulmonary. He was found to have enlargement of his aneurysm now to 5.9 cm with no evidence of rupture. He also underwent a cardiac catheterization yesterday which showed no evidence of coronary disease. He has no symptoms referable to his aneurysm.   Hospital Course:  Tyler Mcmillan is a 71 y.o. male is S/P Procedure(s): ABDOMINAL AORTIC ENDOVASCULAR STENT GRAFT B LE edema, feet warm and well perfused, sensation and active range of motion intact. Groin soft without hematoma, min ecchymosis Lungs non labored breathing Heart RRR   Assessment/Planning: POD # 1 EVAR  He is ambulating and tolerating PO's.  He has  not voided since the foley was D/C'd.  Once he independently voids he can be discharged home. F/U in 4 weeks with CTA AB/pelvis   Significant Diagnostic Studies: CBC Lab Results  Component Value Date   WBC 9.1 09/09/2016   HGB 14.3 09/09/2016   HCT 43.7 09/09/2016   MCV 106.3 (H) 09/09/2016   PLT 189 09/09/2016    BMET    Component Value Date/Time   NA 137 09/09/2016 0220   NA 133 (L) 08/09/2016 1520   K 4.5 09/09/2016 0220   K 4.7 08/09/2016 1520   CL 105 09/09/2016 0220   CO2 24 09/09/2016 0220   CO2 27 08/09/2016 1520   GLUCOSE 126 (H) 09/09/2016 0220   GLUCOSE 106 08/09/2016 1520   BUN 6 09/09/2016 0220   BUN 3.9 (L) 08/09/2016 1520   CREATININE 0.77 09/09/2016 0220   CREATININE 0.8 08/09/2016 1520   CALCIUM 8.2 (L) 09/09/2016 0220   CALCIUM 9.6 08/09/2016 1520   GFRNONAA >60 09/09/2016 0220   GFRAA >60 09/09/2016 0220   COAG Lab Results  Component Value Date   INR 1.27 09/08/2016   INR 1.15 09/06/2016     Disposition:  Discharge to :Home Discharge Instructions    Call MD for:  redness, tenderness, or signs of infection (pain, swelling, bleeding, redness, odor or green/yellow discharge around incision site)    Complete by:  As directed    Call MD for:  severe or increased pain, loss or decreased feeling  in affected limb(s)    Complete by:  As directed    Call MD for:  temperature >100.5    Complete by:  As directed    Discharge instructions    Complete by:  As directed    You may shower daily, remove the dressing from both groins prior to showering.  Gradually increase your daily activity as you tolerate.   Discharge patient    Complete by:  As directed    Discharge once he is able to independently void.   Discharge disposition:  01-Home or Self Care   Discharge patient date:  09/09/2016   Driving Restrictions    Complete by:  As directed    No driving for 1 week   Lifting restrictions    Complete by:  As directed    No heavy lifting for 4  weeks    Resume previous diet    Complete by:  As directed      Allergies as of 09/09/2016   No Known Allergies     Medication List    TAKE these medications   ALPRAZolam 0.5 MG tablet Commonly known as:  XANAX Take 0.5 mg by mouth 2 (two) times daily.   amLODipine 2.5 MG tablet Commonly known as:  NORVASC Take 1 tablet (2.5 mg total) by mouth daily.   atenolol 50 MG tablet Commonly known as:  TENORMIN Take 50 mg by mouth daily.   clonazePAM 0.5 MG tablet Commonly known as:  KLONOPIN Take 0.5 mg by mouth 2 (two) times daily.   fluticasone 50 MCG/ACT nasal spray Commonly known as:  FLONASE Place 1 spray into both nostrils daily as needed for allergies or rhinitis.   ibuprofen 200 MG tablet Commonly known as:  ADVIL,MOTRIN Take 400 mg by mouth daily as needed for mild pain.   oxyCODONE-acetaminophen 5-325 MG tablet Commonly known as:  PERCOCET/ROXICET Take 1 tablet by mouth every 6 (six) hours as needed for moderate pain.      Verbal and written Discharge instructions given to the patient. Wound care per Discharge AVS Follow-up Information    Grace Isaac, MD Follow up on 09/28/2016.   Specialty:  Cardiothoracic Surgery Why:  12:30 pm  Contact information: [address] [place] [address] [phone]        [name], MD Follow up in 4 week(s).   Specialties:  Vascular Surgery, Cardiology Why:  Office will call you to arrange your appt (sent) Contact information: [place] [address] [phone]           Signed: Laurence Slate [facility] 09/10/2016, 10:23 AM - For VQI Registry use --- Instructions: Press F2 to tab through selections.  Delete question if not applicable.   Post-op:  Time to Extubation: [x ] In OR, [ ]  < 12 hrs, [ ]  12-24 hrs, [ ]  >=24 hrs Vasopressors Req. Post-op: No MI: [ ]  No, [ ]  Troponin only, [ ]  EKG or Clinical New Arrhythmia: No CHF: No ICU Stay: 0 days Transfusion: No  If yes, 0 units  given  Complications: Resp failure: [x ] none, [ ]  Pneumonia, [ ]  Ventilator Chg in renal function: [ x] none, [ ]  Inc. Cr > 0.5, [ ]  Temp. Dialysis, [ ]  Permanent dialysis Leg ischemia: [x ] No, [ ]  Yes, no Surgery needed, [ ]  Yes, Surgery needed, [ ]  Amputation Bowel ischemia: [x ] No, [ ]  Medical Rx, [ ]  Surgical Rx Wound complication: [x ] No, [ ]  Superficial separation/infection, [ ]  Return to OR  Return to OR: No  Return to OR for bleeding: No Stroke: [x ] None, [ ]  Minor, [ ]  Major  Discharge medications: Statin use:  No  for medical reason   ASA use:  No  for medical reason   Plavix use:  No  for medical reason   Beta blocker use:  Yes

## 2016-09-14 ENCOUNTER — Telehealth: Payer: Self-pay | Admitting: Vascular Surgery

## 2016-09-14 NOTE — Telephone Encounter (Signed)
Sched CTA 10/19/16 at 9:30 at [place]. Sched MD 10/26/16 at 1:45. Spoke to pt.

## 2016-09-14 NOTE — Telephone Encounter (Signed)
-----   Message from Mena Goes, RN sent at 09/08/2016 11:24 AM EDT ----- Regarding: 4 weeks w/ CTA    ----- Message ----- From: Ulyses Amor, PA-C Sent: 09/08/2016  10:40 AM To: Vvs Charge Pool  F/U with DR. Early s/p EVAR needs CTA 4 weeks

## 2016-09-15 ENCOUNTER — Ambulatory Visit: Payer: Medicare Other | Admitting: Vascular Surgery

## 2016-09-17 ENCOUNTER — Telehealth: Payer: Self-pay | Admitting: *Deleted

## 2016-09-17 DIAGNOSIS — G8918 Other acute postprocedural pain: Secondary | ICD-10-CM

## 2016-09-17 MED ORDER — OXYCODONE-ACETAMINOPHEN 5-325 MG PO TABS: 1.0000 | ORAL_TABLET | Freq: Four times a day (QID) | ORAL | 0 refills | Status: DC | PRN

## 2016-09-17 NOTE — Telephone Encounter (Signed)
Patient called to report that he was having problems with urination x 2 days. He says that when he pulls the foreskin back to urinate he has pain and also the tip of his penis is red and irritated. He has a history of bladder cancer and had a TURBT by Dr. [name] in 2014. I have advised him to call [facility] and get instruction from them on this penile irritation.   He is also having pain and bruising in his groin since his EVAR on 09-08-16 by Dr. Donnetta Hutching. He is afebrile. I spoke with Dr. Donnetta Hutching yesterday about his discharge pain medication because he was only Rx'd #6 Percocet instead of the #15 that is our protocol for EVAR. He told me that Mr. Cundari could get a full prescription if needed.

## 2016-09-27 ENCOUNTER — Encounter: Payer: Self-pay | Admitting: Vascular Surgery

## 2016-09-27 ENCOUNTER — Encounter (HOSPITAL_COMMUNITY): Payer: Medicare Other

## 2016-09-27 ENCOUNTER — Other Ambulatory Visit: Payer: Self-pay

## 2016-09-27 ENCOUNTER — Ambulatory Visit (HOSPITAL_COMMUNITY)
Admission: RE | Admit: 2016-09-27 | Discharge: 2016-09-27 | Disposition: A | Discharge status: Home / Self Care | Payer: Medicare Other | Source: Ambulatory Visit | Attending: Surgery | Admitting: Surgery

## 2016-09-27 DIAGNOSIS — I714 Abdominal aortic aneurysm, without rupture, unspecified: Secondary | ICD-10-CM

## 2016-09-27 DIAGNOSIS — M79609 Pain in unspecified limb: Secondary | ICD-10-CM | POA: Diagnosis present

## 2016-09-27 DIAGNOSIS — M7989 Other specified soft tissue disorders: Secondary | ICD-10-CM | POA: Insufficient documentation

## 2016-09-28 ENCOUNTER — Ambulatory Visit (INDEPENDENT_AMBULATORY_CARE_PROVIDER_SITE_OTHER): Payer: Self-pay | Admitting: Vascular Surgery

## 2016-09-28 ENCOUNTER — Other Ambulatory Visit: Payer: Self-pay | Admitting: Vascular Surgery

## 2016-09-28 ENCOUNTER — Encounter: Payer: Self-pay | Admitting: Vascular Surgery

## 2016-09-28 ENCOUNTER — Ambulatory Visit: Payer: Medicare Other | Admitting: Cardiothoracic Surgery

## 2016-09-28 VITALS — BP 136/82 | HR 78 | Temp 98.7°F | Ht 70.0 in | Wt 210.0 lb

## 2016-09-28 DIAGNOSIS — M7989 Other specified soft tissue disorders: Secondary | ICD-10-CM

## 2016-09-28 MED ORDER — FUROSEMIDE 20 MG PO TABS: 20.0000 mg | ORAL_TABLET | Freq: Every day | ORAL | 0 refills | Status: DC

## 2016-09-28 NOTE — Progress Notes (Signed)
   Patient name: Tyler Mcmillan MRN: [id number] DOB: 1945-09-18 Sex: male  REASON FOR VISIT: Patient's here today for evaluation of bilateral lower from the swelling. He underwent stent graft repair with biplane bilateral percutaneous access to both groins on 09/08/2016. He did well and was discharged home. He reports noting progressive swelling since discharge from surgery. He does report some tenderness over his pretibial area. He underwent duplex in our office just initiated no evidence of DVT.  HPI: Tyler Mcmillan is a 71 y.o. male scooter well-perfused with 2+ dorsalis pedis pulses bilaterally. He does have pitting edema from his knees distally bilaterally.  Current Outpatient Prescriptions  Medication Sig Dispense Refill  . ALPRAZolam (XANAX) 0.5 MG tablet Take 0.5 mg by mouth 2 (two) times daily.    [name] amLODipine (NORVASC) 2.5 MG tablet Take 1 tablet (2.5 mg total) by mouth daily. 30 tablet 0  . atenolol (TENORMIN) 50 MG tablet Take 50 mg by mouth daily.     . clonazePAM (KLONOPIN) 0.5 MG tablet Take 0.5 mg by mouth 2 (two) times daily.     . fluticasone (FLONASE) 50 MCG/ACT nasal spray Place 1 spray into both nostrils daily as needed for allergies or rhinitis.    [name] ibuprofen (ADVIL,MOTRIN) 200 MG tablet Take 400 mg by mouth daily as needed for mild pain.    [name] oxyCODONE-acetaminophen (PERCOCET/ROXICET) 5-325 MG tablet Take 1 tablet by mouth every 6 (six) hours as needed for moderate pain. (Patient not taking: Reported on 09/28/2016) 15 tablet 0   No current facility-administered medications for this visit.      PHYSICAL EXAM: Vitals:   09/28/16 1346  BP: 136/82  Pulse: 78  Temp: 98.7 F (37.1 C)  TempSrc: Oral  SpO2: 93%  Weight: 210 lb (95.3 kg)  Height: 5\' 10"  (1.778 m)    GENERAL: The patient is a well-nourished male, in no acute distress. The vital signs are documented above. Pitting edema bilaterally with erythema as well.  MEDICAL  ISSUES: Unclear as to the etiologyof his swelling. Doesn't related to the generalized volume overload versus a local issue. Suspect this is related to volume overload. Have written him for Lasix 20 mg by mouth daily. He reports that he is taking Lasix in the past. He is to contact Dr.Pharr's office to obtain blood work in 1-2 weeks to assure his potassium level is adequate. I will see him as scheduled on 10/26/2016 with CT scan of his abdomen and pelvis for follow-up of his stent graft repair   [name], MD [facility] Tel [phone] Pager [phone]

## 2016-09-30 ENCOUNTER — Encounter: Payer: Self-pay | Admitting: Radiation Oncology

## 2016-09-30 ENCOUNTER — Ambulatory Visit (INDEPENDENT_AMBULATORY_CARE_PROVIDER_SITE_OTHER): Payer: Medicare Other | Admitting: Cardiothoracic Surgery

## 2016-09-30 ENCOUNTER — Encounter: Payer: Self-pay | Admitting: Cardiothoracic Surgery

## 2016-09-30 VITALS — BP 138/75 | HR 78 | Resp 20 | Ht 70.0 in | Wt 208.0 lb

## 2016-09-30 DIAGNOSIS — C3412 Malignant neoplasm of upper lobe, left bronchus or lung: Secondary | ICD-10-CM

## 2016-09-30 NOTE — Progress Notes (Signed)
[address] Mcmillan [address]       [place] [address]             [phone]                    Tyler Mcmillan [place] Medical Record #[id number] Date of Birth: 30-Aug-1945  Referring: Tyler Posner, MD Primary Care: Tyler Pretty, MD  Chief Complaint:    Chief Complaint  Patient presents with  . Lung Cancer    s/p EVAR -Dr Tyler Mcmillan 09/08/2016    History of Present Illness:    Tyler Mcmillan 71 y.o. male was seen in the office  today for For evaluation of abdominal aortic aneurysm and left lung mass. On Memorial Day weekend the patient noted increasing shortness of breath and chest discomfort ultimately he was admitted to the hospital, had CT scan of the chest and cardiac catheterization. His catheterization was without significant disease and he was discharged home. Since discharge she's had further evaluation with PET scan. On June 11 he underwent bronchoscopy by the pulmonary service. With biopsy of the left upper lobe:  Diagnosis Lung, transbronchial biopsy - NON-SMALL CELL CARCINOMA, SEE COMMENT. Microscopic Comment There is a fragment with malignant cells and desmoplastic stroma. The morphology favors a squamous cell carcinoma. Other fragments show inflammation including giant cells and fragments of necrosis. AFB, GMS, and PAS are negative for organisms. Dr. Lyndon Mcmillan has reviewed the case. Dr. Vaughan Mcmillan was paged on 07/20/2016. There is likely insufficient tissue for ancillary studies. Tyler Males MD  The patient was also noted to have a large abdominal aortic aneurysm, which was repaired with a stent graft August 1. He now returns to decide about treatment of his lung malignancy.  Current Activity/ Functional Status:  Patient is independent with mobility/ambulation, transfers, ADL's, IADL's.   Zubrod Score: At the time of surgery this patient's most appropriate activity status/level should be described as: []     0    Normal activity, no symptoms [x]     1    Restricted in  physical strenuous activity but ambulatory, able to do out light work []     2    Ambulatory and capable of self care, unable to do work activities, up and about               >50 % of waking hours                              []     3    Only limited self care, in bed greater than 50% of waking hours []     4    Completely disabled, no self care, confined to bed or chair []     5    Moribund   Past Medical History:  Diagnosis Date  . AAA (abdominal aortic aneurysm) without rupture ([place])    [place] ABD.  DONE  JUNE 2014  3.5  . Bladder cancer ([place])   . Bladder neoplasm   . Chronic anxiety   . GERD (gastroesophageal reflux disease)   . Hypertension   . 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 ABDOMINAL AORTIC ENDOVASCULAR STENT GRAFT N/A 09/08/2016   Procedure: ABDOMINAL AORTIC ENDOVASCULAR STENT GRAFT;  Surgeon: Tyler Posner, MD;  Location: [facility];  Service: Vascular;  Laterality: N/A;  . CYSTOSCOPY W/ URETERAL  STENT PLACEMENT Left 10/04/2012   Procedure: CYSTOSCOPY WITH RETROGRADE PYELOGRAM/URETERAL STENT PLACEMENT   "POSSIBLE LEFT STENT";  Surgeon: Tyler Frock, MD;  [facility];  Service: Urology;  Laterality: Left;  . LEFT HEART CATH AND CORONARY ANGIOGRAPHY N/A 07/02/2016   Procedure: Left Heart Cath and Coronary Angiography;  Surgeon: Martinique, Peter M, MD;  Location: [place] [facility];  Service: Cardiovascular;  Laterality: N/A;  . MEATOTOMY N/A 10/04/2012   Procedure: MEATOTOMY ADULT;  Surgeon: Tyler Frock, MD;  Location: [facility];  Service: Urology;  Laterality: N/A;  . TRANSURETHRAL RESECTION OF BLADDER TUMOR WITH GYRUS (TURBT-GYRUS) N/A 10/04/2012   Procedure: TRANSURETHRAL RESECTION OF BLADDER TUMOR WITH GYRUS (TURBT-GYRUS);  Surgeon: Tyler Frock, MD;  Location: [facility];  Service: Urology;  Laterality: N/A;  . VIDEO BRONCHOSCOPY Bilateral  07/19/2016   Procedure: VIDEO BRONCHOSCOPY WITH FLUORO;  Surgeon: Tyler Garfinkel, MD;  Location: [facility];  Service: Cardiopulmonary;  Laterality: Bilateral;    Family History  Problem Relation Age of Onset  . Cancer Mother   . Heart disease Father        before age 58  . Heart attack Father   . Cancer Brother     Social History   Social History  . Marital status: Married    Spouse name: N/A  . Number of children: N/A  . Years of education: N/A   Occupational History  . Not on file.   Social History Main Topics  . Smoking status: Current Every Day Smoker    Packs/day: 0.50    Years: 50.00    Types: Cigarettes  . Smokeless tobacco: Never Used     Comment: pt refuses info  . Alcohol use 14.4 oz/week    10 Glasses of wine, 14 Cans of beer per week     Comment: few glases wine per day, beer  . Drug use: No  . Sexual activity: Not on file   Other Topics Concern  . Not on file   Social History Narrative  . No narrative on file    History  Smoking Status  . Current Every Day Smoker  . Packs/day: 0.50  . Years: 50.00  . Types: Cigarettes  Smokeless Tobacco  . Never Used    Comment: pt refuses info    History  Alcohol Use  . 14.4 oz/week  . 10 Glasses of wine, 14 Cans of beer per week    Comment: few glases wine per day, beer     No Known Allergies  Current Outpatient Prescriptions  Medication Sig Dispense Refill  . amLODipine (NORVASC) 2.5 MG tablet Take 1 tablet (2.5 mg total) by mouth daily. 30 tablet 0  . atenolol (TENORMIN) 50 MG tablet Take 50 mg by mouth daily.     . clonazePAM (KLONOPIN) 0.5 MG tablet Take 0.5 mg by mouth 2 (two) times daily.     . furosemide (LASIX) 20 MG tablet Take 1 tablet (20 mg total) by mouth daily. (Patient taking differently: Take 20 mg by mouth as needed. ) 30 tablet 0  . ibuprofen (ADVIL,MOTRIN) 200 MG tablet Take 400 mg by mouth daily as needed for mild pain.     No current facility-administered medications for  this visit.     Pertinent items are noted in HPI.   Review of Systems:     Cardiac Review of Systems: Y or N  Chest Pain [ y   ]  Resting SOB Blue.Reese   ]  Exertional SOB  Blue.Reese  ]  [place].Latina [ y ]   Pedal Edema [ n  ]    Palpitations [ n ] Syncope  [ n ]   Presyncope [  n ]  General Review of Systems: [Y] = yes [  ]=no Constitional: recent weight change [ n ];  Wt loss over the last 3 months [   ] anorexia [  ]; fatigue [  ]; nausea [  ]; night sweats [  ]; fever [  ]; or chills [  ];          Dental: poor dentition[  ]; Last Dentist visit:   Eye : blurred vision [  ]; diplopia [   ]; vision changes [  ];  Amaurosis fugax[  ]; Resp: cough [ y ];  wheezing[ y ];  hemoptysis[n  ]; shortness of breath[y  ]; paroxysmal nocturnal dyspnea[y  ]; dyspnea on exertion[ y ]; or orthopnea[  ];  GI:  gallstones[  ], vomiting[  ];  dysphagia[  ]; melena[  ];  hematochezia [  ]; heartburn[  ];   Hx of  Colonoscopy[  ]; GU: kidney stones [  ]; hematuria[  ];   dysuria [  ];  nocturia[  ];  history of     obstruction [  ]; urinary frequency [  ]             Skin: rash, swelling[  ];, hair loss[  ];  peripheral edema[  ];  or itching[  ]; Musculosketetal: myalgias[  ];  joint swelling[  ];  joint erythema[  ];  joint pain[  ];  back pain[  ];  Heme/Lymph: bruising[  ];  bleeding[  ];  anemia[  ];  Neuro: TIA[ n];  headaches[ n ];  stroke[n  ];  vertigo[n  ];  seizures[ n ];   paresthesias[n  ];  difficulty walking[ n ];  Psych:depression[  ]; anxiety[  ];  Endocrine: diabetes[  n];  thyroid dysfunction[  n];  Immunizations: Flu up to date Florencio.Farrier  ]; Pneumococcal up to date [ n ];  Other:  Physical Exam: BP 138/75   Pulse 78   Resp 20   Ht 5\' 10"  (1.778 Mcmillan)   Wt 208 lb (94.3 kg)   SpO2 93% Comment: RA  BMI 29.84 kg/Mcmillan   PHYSICAL EXAMINATION: General appearance: alert, cooperative, appears older than stated age and mild distress Head: Normocephalic, without obvious abnormality, atraumatic Neck: no adenopathy, no  carotid bruit, no JVD, supple, symmetrical, trachea midline and thyroid not enlarged, symmetric, no tenderness/mass/nodules Lymph nodes: Cervical, supraclavicular, and axillary nodes normal. Resp: diminished breath sounds bibasilar Back: symmetric, no curvature. ROM normal. No CVA tenderness. Cardio: regular rate and rhythm, S1, S2 normal, no murmur, click, rub or gallop GI: soft, non-tender; bowel sounds normal; no masses,  no organomegaly Extremities: extremities normal, atraumatic, no cyanosis, 2+ edema both lower extremities  Neurologic: Grossly normal Patient mildly short of breath at rest,   Diagnostic Studies & Laboratory data:     Recent Radiology Findings:   Mr Jeri Cos Wo Contrast  Result Date: 08/16/2016 CLINICAL DATA:  Squamous cell lung cancer.  Initial staging. EXAM: MRI HEAD WITHOUT AND WITH CONTRAST TECHNIQUE: Multiplanar, multiecho pulse sequences of the brain and surrounding structures were obtained without and with intravenous contrast. CONTRAST:  53mL MULTIHANCE GADOBENATE DIMEGLUMINE 529 MG/ML IV SOLN COMPARISON:  None. FINDINGS: Brain: No acute infarction, hemorrhage, hydrocephalus, extra-axial collection or mass lesion.  Generalized cortical atrophy. Minimal microvascular ischemic type changes in the cerebral white matter. Vascular: Major flow voids are present Skull and upper cervical spine: No evidence of marrow lesion Sinuses/Orbits: Negative IMPRESSION: Negative for intracranial metastasis. Electronically Signed   By: Monte Fantasia Mcmillan.D.   On: 08/16/2016 08:28   Nm Pet Image Initial (pi) Skull Base To Thigh  Result Date: 08/03/2016 CLINICAL DATA:  Initial treatment strategy for cavitary lung lesion. EXAM: NUCLEAR MEDICINE PET SKULL BASE TO THIGH TECHNIQUE: 10.0 mCi F-18 FDG was injected intravenously. Full-ring PET imaging was performed from the skull base to thigh after the radiotracer. CT data was obtained and used for attenuation correction and anatomic localization.  FASTING BLOOD GLUCOSE:  Value: 146 mg/dl COMPARISON:  CT chest 07/02/2016 FINDINGS: NECK No hypermetabolic lymph nodes in the neck. CHEST 3.8 cm left upper lobe cavitary lesion demonstrates a thicker ram of soft tissue and less central cavitation when compared to the chest CT from 1 month ago. The lesion is hypermetabolic with SUV max of 33.8 and is concerning for squamous cell neoplasm. No enlarged or hypermetabolic mediastinal/hilar lymph nodes to suggest metastatic adenopathy. Stable nodularity at the left lung base in the lingula but no hypermetabolism. Recommend attention on future scans. Stable emphysematous changes. Stable atherosclerotic calcifications involving the aorta and branch vessels including the coronary arteries. ABDOMEN/PELVIS No abnormal hypermetabolic activity within the liver, pancreas, adrenal glands, or spleen. No hypermetabolic lymph nodes in the abdomen or pelvis. Marked hypermetabolism noted at the anal rectal junction without discrete mass in the CT scan. This could be due to internal hemorrhoids but recommend correlation with physical examination. Additional findings include diffuse fatty infiltration of the liver, gallstones, 5.7 x 5.6 infrarenal abdominal aortic aneurysm, bilateral inguinal hernias with the bladder partially in the right inguinal hernia. The aneurysm measured 3.7 x 3.8 cm on the prior CT scan from 2014. SKELETON No focal hypermetabolic activity to suggest skeletal metastasis. IMPRESSION: 1. Hypermetabolic cavitary left upper lobe lung mass consistent with neoplasm. No enlarged or hypermetabolic mediastinal/hilar lymph nodes or evidence of metastatic disease elsewhere. 2. 5.7 x 5.6 cm infrarenal abdominal aortic aneurysm increased since prior CT scan 2014 Vascular surgery consultation recommended due to increased risk of rupture for AAA >5.5 cm. This recommendation follows ACR consensus guidelines: White Paper of the ACR Incidental Findings Committee II on Vascular  Findings. J Am Coll Radiol 2013; 10:789-794. 3. Focal area of marked increased uptake in the anal rectal junction region. This could be due to internal hemorrhoids but recommend correlation with physical examination. Electronically Signed   By: Marijo Sanes Mcmillan.D.   On: 08/03/2016 10:07    I have independently reviewed the above radiology studies  and reviewed the findings with the patient.  CATH: Procedures   Left Heart Cath and Coronary Angiography  Conclusion     The left ventricular systolic function is normal.  LV end diastolic pressure is normal.  The left ventricular ejection fraction is 55-65% by visual estimate.   1. Nonobstructive CAD 2. Normal LV function 3. Normal LVEDP  Plan: risk factor modification. I suspect his chest pain is more related to his pulmonary abnormality and cough.     Recent Lab Findings: Lab Results  Component Value Date   WBC 9.1 09/09/2016   HGB 14.3 09/09/2016   HCT 43.7 09/09/2016   PLT 189 09/09/2016   GLUCOSE 126 (H) 09/09/2016   ALT 33 09/06/2016   AST 84 (H) 09/06/2016   NA 137 09/09/2016   K  4.5 09/09/2016   CL 105 09/09/2016   CREATININE 0.77 09/09/2016   BUN 6 09/09/2016   CO2 24 09/09/2016   INR 1.27 09/08/2016   PFT's  FEV1 2.02 63% 20.58 63 %   Pulmonary Function Diagnosis: Moderate Obstructive Airways Disease Restriction -Probable Moderate Diffusion Defect  Assessment / Plan:    3.8 cm left upper lobe cavitary lesion  Hypermetabolic  consistent with neoplasm.  Clinical stage IB squamous cell carcinoma the lung, left upper-lobe.  the patient has moderate airway disease and diffusion deficit , he continues to smoke,I discussed with he and his wife the risks and options of surgical treatment versus radiation and again with the patient today. He comes into dtoday and preferrs to proceed with Radiation therapy. Overall his functional status is limited so his decision is reasonable. Will refer for Radiation therapy.    5.7 x 5.6 infrarenal abdominal aortic aneurysm, stent graft done several weeks ago by Dr Tyler Mcmillan, has some pedal edam started on lasix several days ago and got duplex to check for dvt     bilateral inguinal hernias with the bladder partially in the right inguinal hernia.  Grace Isaac MD      [place] [place] [address] Office [phone]   Beeper [phone]  09/30/2016 1:27 PM

## 2016-10-04 NOTE — Progress Notes (Signed)
Thoracic Location of Tumor / Histology: left lung mass, Left Upper lobe  3.8cm    Patient presented with symptoms of: increasing shortness of breath and chest discomfort.none at present  Biopsies revealed:   07/19/16 Diagnosis BRONCHIAL LAVAGE, LUL (SPECIMEN 1 OF 2 COLLECTED 07/19/16): BENIGN REACTIVE/REPARATIVE CHANGES.  Diagnosis BRONCHIAL BRUSHING, LEFT (SPECIMEN 2 OF 2 COLLECTED 07/19/16): MALIGNANT CELLS PRESENT CONSISTENT WITH NON-SMALL CELL CARCINOMA, SEE COMMENT.  Diagnosis Lung, transbronchial biopsy - NON-SMALL CELL CARCINOMA, SEE COMMENT.  Tobacco/Marijuana/Snuff/ETOH use: current smoker, smoked 0.5 ppd for 50 years, drinks 10 beers per week, 14 glasses of wine per week  Past/Anticipated interventions by cardiothoracic surgery, if NGE:XBMWUX 1,2018 Dr. Donnetta Hutching  AAA surgery  Past/Anticipated interventions by medical oncology, if any: Dr. [name] seen 08/09/16  Signs/Symptoms  Weight changes, if any: No  Respiratory complaints, if any: none cough congestive,   Hemoptysis, if any: No  Pain issues, if any: abdomen  SAFETY ISSUES:  Prior radiation? No  Pacemaker/ICD?  NO    Is the patient on methotrexate? NO  Current Complaints / other details:  Married,  Mother Colon, went to kidney,  Cancer,, Father Mi , Brother Lung  Cancer from [place] Orange  BP 133/62   Pulse 84   Temp 98.3 F (36.8 C) (Oral)   Resp 20   Ht 5\' 10"  (1.778 m)   Wt 213 lb 3.2 oz (96.7 kg)   SpO2 97% Comment: room air  BMI 30.59 kg/m   Wt Readings from Last 3 Encounters:  10/05/16 213 lb 3.2 oz (96.7 kg)  09/30/16 208 lb (94.3 kg)  09/28/16 210 lb (95.3 kg)

## 2016-10-05 ENCOUNTER — Ambulatory Visit
Admission: RE | Admit: 2016-10-05 | Discharge: 2016-10-05 | Disposition: A | Discharge status: Home / Self Care | Payer: Medicare Other | Source: Ambulatory Visit

## 2016-10-05 ENCOUNTER — Ambulatory Visit
Admission: RE | Admit: 2016-10-05 | Discharge: 2016-10-05 | Disposition: A | Discharge status: Home / Self Care | Payer: Medicare Other | Source: Ambulatory Visit | Attending: Radiation Oncology | Admitting: Radiation Oncology

## 2016-10-05 ENCOUNTER — Encounter: Payer: Self-pay | Admitting: Radiation Oncology

## 2016-10-05 VITALS — BP 133/62 | HR 84 | Temp 98.3°F | Resp 20 | Ht 70.0 in | Wt 213.2 lb

## 2016-10-05 DIAGNOSIS — R109 Unspecified abdominal pain: Secondary | ICD-10-CM | POA: Diagnosis not present

## 2016-10-05 DIAGNOSIS — Z79899 Other long term (current) drug therapy: Secondary | ICD-10-CM | POA: Diagnosis not present

## 2016-10-05 DIAGNOSIS — I10 Essential (primary) hypertension: Secondary | ICD-10-CM | POA: Insufficient documentation

## 2016-10-05 DIAGNOSIS — C3412 Malignant neoplasm of upper lobe, left bronchus or lung: Secondary | ICD-10-CM

## 2016-10-05 DIAGNOSIS — Z8551 Personal history of malignant neoplasm of bladder: Secondary | ICD-10-CM | POA: Diagnosis not present

## 2016-10-05 DIAGNOSIS — K219 Gastro-esophageal reflux disease without esophagitis: Secondary | ICD-10-CM | POA: Diagnosis not present

## 2016-10-05 DIAGNOSIS — Z8249 Family history of ischemic heart disease and other diseases of the circulatory system: Secondary | ICD-10-CM | POA: Insufficient documentation

## 2016-10-05 DIAGNOSIS — F419 Anxiety disorder, unspecified: Secondary | ICD-10-CM | POA: Insufficient documentation

## 2016-10-05 DIAGNOSIS — C3492 Malignant neoplasm of unspecified part of left bronchus or lung: Secondary | ICD-10-CM

## 2016-10-05 DIAGNOSIS — F17210 Nicotine dependence, cigarettes, uncomplicated: Secondary | ICD-10-CM | POA: Diagnosis not present

## 2016-10-05 DIAGNOSIS — G8918 Other acute postprocedural pain: Secondary | ICD-10-CM | POA: Diagnosis not present

## 2016-10-05 NOTE — Progress Notes (Addendum)
Radiation Oncology         (336) [phone] ________________________________  Name: Tyler Mcmillan        MRN: [id number]  Date of Service: 10/05/2016 DOB: 05/14/1945  EU:MPNTI, Thayer Jew, MD  Grace Isaac, MD     REFERRING PHYSICIAN: Grace Isaac, MD   DIAGNOSIS: The primary encounter diagnosis was Primary malignant neoplasm of bronchus of left upper lobe ([place]). 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   HISTORY OF PRESENT ILLNESS: Tyler Mcmillan is a 70 y.o. male seen at the request of Dr. Servando Snare for a recent diagnosis of early stage lung cancer. He presented with abdominal discomfort and shortness of breath in the ED on 07/02/16, and CTPA was negative for PE, but revealed a 3.9 x 2.5 cm lesion in the left upper lobe, and a 56 x 59 mm abdominal infrarenal aortic aneurysm. He underwent bronchoscopy on 07/19/16 revealing a squamous cell carcinoma of the lung. He had a PET scan on 08/03/16 that revealed hypermetabolic change within the left upper lobe mass, and no evidence of disease. He underwent abdominal aortic endovascular stent graft with Dr. Donnetta Hutching on 09/08/2016. He met with Dr. Servando Snare in cardiothoracic surgery to discuss options for surgical resection, however has declined and would rather move forward with less invasive approach, he comes today to discuss stereotactic body radiotherapy.    PREVIOUS RADIATION THERAPY: No   PAST MEDICAL HISTORY:  Past Medical History:  Diagnosis Date  . AAA (abdominal aortic aneurysm) without rupture ([place])    [place] ABD.  DONE  JUNE 2014  3.5  . Bladder cancer ([place])   . Bladder neoplasm   . Chronic anxiety   . GERD (gastroesophageal reflux disease)   . Hypertension   . 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 ABDOMINAL AORTIC ENDOVASCULAR STENT GRAFT N/A 09/08/2016   Procedure:  ABDOMINAL AORTIC ENDOVASCULAR STENT GRAFT;  Surgeon: [name], MD;  Location: [place];  Service: Vascular;  Laterality: N/A;  . CYSTOSCOPY W/ URETERAL STENT PLACEMENT Left 10/04/2012   Procedure: CYSTOSCOPY WITH RETROGRADE PYELOGRAM/URETERAL STENT PLACEMENT   "POSSIBLE LEFT STENT";  Surgeon: Alexis Frock, MD;  Location: [facility];  Service: Urology;  Laterality: Left;  . LEFT HEART CATH AND CORONARY ANGIOGRAPHY N/A 07/02/2016   Procedure: Left Heart Cath and Coronary Angiography;  Surgeon: Martinique, Peter M, MD;  Location: [facility];  Service: Cardiovascular;  Laterality: N/A;  . MEATOTOMY N/A 10/04/2012   Procedure: MEATOTOMY ADULT;  Surgeon: Alexis Frock, MD;  Location: [facility];  Service: Urology;  Laterality: N/A;  . TRANSURETHRAL RESECTION OF BLADDER TUMOR WITH GYRUS (TURBT-GYRUS) N/A 10/04/2012   Procedure: TRANSURETHRAL RESECTION OF BLADDER TUMOR WITH GYRUS (TURBT-GYRUS);  Surgeon: Alexis Frock, MD;  [facility];  Service: Urology;  Laterality: N/A;  . VIDEO BRONCHOSCOPY Bilateral 07/19/2016   Procedure: VIDEO BRONCHOSCOPY WITH FLUORO;  Surgeon: Marshell Garfinkel, MD;  Location: [facility];  Service: Cardiopulmonary;  Laterality: Bilateral;     FAMILY HISTORY:  Family History  Problem Relation Age of Onset  . Cancer Mother   . Heart disease Father        before age 18  . Heart attack Father   . Cancer Brother      SOCIAL HISTORY:  reports that he has been  smoking Cigarettes.  He has a 25.00 pack-year smoking history. He has never used smokeless tobacco. He reports that he drinks about 14.4 oz of alcohol per week . He reports that he does not use drugs. 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    ALLERGIES: Patient has no known allergies.   MEDICATIONS:  Current Outpatient Prescriptions  Medication Sig Dispense Refill  . amLODipine (NORVASC) 2.5 MG tablet Take 1 tablet (2.5 mg total) by  mouth daily. 30 tablet 0  . atenolol (TENORMIN) 50 MG tablet Take 50 mg by mouth daily.     . clonazePAM (KLONOPIN) 0.5 MG tablet Take 0.5 mg by mouth 2 (two) times daily.     . furosemide (LASIX) 20 MG tablet Take 1 tablet (20 mg total) by mouth daily. (Patient taking differently: Take 20 mg by mouth as needed. ) 30 tablet 0  . ibuprofen (ADVIL,MOTRIN) 200 MG tablet Take 400 mg by mouth daily as needed for mild pain.     No current facility-administered medications for this encounter.      REVIEW OF SYSTEMS: On review of systems, the patient reports that he is doing well overall. He has intermittent abdominal discomfort since his procedure and has an upcoming CT scan in a few weeks to assess his healing. He denies any chest pain, shortness of breath, cough, fevers, chills, night sweats, unintended weight changes. He denies any bowel or bladder disturbances, and denies nausea or vomiting. He denies any new musculoskeletal or joint aches or pains. A complete review of systems is obtained and is otherwise negative.     PHYSICAL EXAM:  Wt Readings from Last 3 Encounters:  10/05/16 213 lb 3.2 oz (96.7 kg)  09/30/16 208 lb (94.3 kg)  09/28/16 210 lb (95.3 kg)   Temp Readings from Last 3 Encounters:  10/05/16 98.3 F (36.8 C) (Oral)  09/28/16 98.7 F (37.1 C) (Oral)  09/09/16 97.7 F (36.5 C) (Oral)   BP Readings from Last 3 Encounters:  10/05/16 133/62  09/30/16 138/75  09/28/16 136/82   Pulse Readings from Last 3 Encounters:  10/05/16 84  09/30/16 78  09/28/16 78   Pain Assessment Pain Score: 4  Pain Loc: Abdomen/10  In general this is a well appearing caucasian male in no acute distress. He is alert and oriented x4 and appropriate throughout the examination. HEENT reveals that the patient is normocephalic, atraumatic. EOMs are intact. PERRLA. Skin is intact without any evidence of gross lesions. Cardiovascular exam reveals a regular rate and rhythm, no clicks rubs or murmurs  are auscultated. Chest is clear to auscultation bilaterally. Lymphatic assessment is performed and does not reveal any adenopathy in the cervical, supraclavicular, axillary, or inguinal chains. Abdomen has active bowel sounds in all quadrants and is intact. The abdomen is soft, non tender, non distended. Lower extremities are negative for pretibial pitting edema, deep calf tenderness, cyanosis or clubbing.   ECOG = 0  0 - Asymptomatic (Fully active, able to carry on all predisease activities without restriction)  1 - Symptomatic but completely ambulatory (Restricted in physically strenuous activity but ambulatory and able to carry out work of a light or sedentary nature. For example, light housework, office work)  2 - Symptomatic, <50% in bed during the day (Ambulatory and capable of all self care but unable to carry out any work activities. Up and about more than 50% of waking hours)  3 - Symptomatic, >50% in bed, but not bedbound (Capable of only  limited self-care, confined to bed or chair 50% or more of waking hours)  4 - Bedbound (Completely disabled. Cannot carry on any self-care. Totally confined to bed or chair)  5 - Death   Eustace Pen MM, Creech RH, Tormey DC, et al. [id number]). "Toxicity and response criteria of the [facility]". [place] Oncol. 5 (6): 649-55    LABORATORY DATA:  Lab Results  Component Value Date   WBC 9.1 09/09/2016   HGB 14.3 09/09/2016   HCT 43.7 09/09/2016   MCV 106.3 (H) 09/09/2016   PLT 189 09/09/2016   Lab Results  Component Value Date   NA 137 09/09/2016   K 4.5 09/09/2016   CL 105 09/09/2016   CO2 24 09/09/2016   Lab Results  Component Value Date   ALT 33 09/06/2016   AST 84 (H) 09/06/2016   ALKPHOS 89 09/06/2016   BILITOT 1.5 (H) 09/06/2016      RADIOGRAPHY: Dg Chest Port 1 View  Result Date: 09/08/2016 CLINICAL DATA:  Cough and hypertension.  Lung carcinoma EXAM: PORTABLE CHEST 1 VIEW COMPARISON:  Chest radiograph  July 19, 2016 and PET-CT August 03, 2016 FINDINGS: The previously noted cavitary lesion in the left upper lobe is again noted, measuring 3.5 x 2.5 cm. There is no new parenchymal lung lesion. Heart size and pulmonary vascularity are within normal limits. No adenopathy is appreciable. There is aortic atherosclerosis. No bone lesions. IMPRESSION: Persistent and essentially stable cavitary lesion left upper lobe. No new parenchymal lung lesion. Stable cardiac silhouette. There is aortic atherosclerosis. Aortic Atherosclerosis (ICD10-I70.0). Electronically Signed   By: Lowella Grip III M.D.   On: 09/08/2016 10:59       IMPRESSION/PLAN: 1. Stage IB, cT2aN0M0 NSCLC, squamous cell carcinoma of the LUL. Dr. [name] discusses the pathology findings and reviews the nature of early stage lung cancer. Dr. [name] outlines options of SBRT for patient who are unable to undergo surgery, or who elect for alternative means of treatment.  We discussed the risks, benefits, short, and long term effects of radiotherapy, and the patient is interested in proceeding. Dr. [name] discusses the delivery and logistics of radiotherapy and would anticipate a course of 3-5 treatments. The patient is interested in proceeding. He will return next week for simulation, and we will proceed with treatment. Written consent is obtained and placed in the chart, a copy was provided to the patient. 2. Abdominal discomfort. He will follow up with his interventionalist and we will follow this expectantly.  The above documentation reflects my direct findings during this shared patient visit. Please see the separate note by Dr. [name] on this date for the remainder of the patient's plan of care.    Carola Rhine, PAC

## 2016-10-05 NOTE — Progress Notes (Signed)
Please see the Nurse Progress Note in the MD Initial Consult Encounter for this patient. 

## 2016-10-06 ENCOUNTER — Ambulatory Visit
Admission: RE | Admit: 2016-10-06 | Discharge: 2016-10-06 | Disposition: A | Discharge status: Home / Self Care | Payer: Medicare Other | Source: Ambulatory Visit | Attending: Radiation Oncology | Admitting: Radiation Oncology

## 2016-10-06 ENCOUNTER — Ambulatory Visit: Payer: Medicare Other

## 2016-10-13 ENCOUNTER — Other Ambulatory Visit: Payer: Self-pay | Admitting: Radiation Oncology

## 2016-10-13 ENCOUNTER — Ambulatory Visit
Admission: RE | Admit: 2016-10-13 | Discharge: 2016-10-13 | Disposition: A | Discharge status: Home / Self Care | Payer: Medicare Other | Source: Ambulatory Visit | Attending: Radiation Oncology | Admitting: Radiation Oncology

## 2016-10-13 ENCOUNTER — Encounter (INDEPENDENT_AMBULATORY_CARE_PROVIDER_SITE_OTHER): Payer: Self-pay

## 2016-10-13 DIAGNOSIS — F17210 Nicotine dependence, cigarettes, uncomplicated: Secondary | ICD-10-CM | POA: Diagnosis not present

## 2016-10-13 DIAGNOSIS — Z8249 Family history of ischemic heart disease and other diseases of the circulatory system: Secondary | ICD-10-CM | POA: Diagnosis not present

## 2016-10-13 DIAGNOSIS — Z79899 Other long term (current) drug therapy: Secondary | ICD-10-CM | POA: Diagnosis not present

## 2016-10-13 DIAGNOSIS — C3412 Malignant neoplasm of upper lobe, left bronchus or lung: Secondary | ICD-10-CM | POA: Diagnosis not present

## 2016-10-13 DIAGNOSIS — K219 Gastro-esophageal reflux disease without esophagitis: Secondary | ICD-10-CM | POA: Diagnosis not present

## 2016-10-13 DIAGNOSIS — C3492 Malignant neoplasm of unspecified part of left bronchus or lung: Secondary | ICD-10-CM

## 2016-10-13 DIAGNOSIS — Z8551 Personal history of malignant neoplasm of bladder: Secondary | ICD-10-CM | POA: Diagnosis not present

## 2016-10-13 DIAGNOSIS — R109 Unspecified abdominal pain: Secondary | ICD-10-CM | POA: Diagnosis not present

## 2016-10-13 DIAGNOSIS — F419 Anxiety disorder, unspecified: Secondary | ICD-10-CM | POA: Diagnosis not present

## 2016-10-13 DIAGNOSIS — I10 Essential (primary) hypertension: Secondary | ICD-10-CM | POA: Diagnosis not present

## 2016-10-13 MED ORDER — AMOXICILLIN-POT CLAVULANATE 875-125 MG PO TABS: 1.0000 | ORAL_TABLET | Freq: Two times a day (BID) | ORAL | 0 refills | Status: AC

## 2016-10-13 NOTE — Progress Notes (Signed)
Tyler Mcmillan was seen following his simulation appointment today to treat his LUL NSCLC with SBRT radioatherapy. He has a new area on CT imaging in the lung measuring about 2 cm in the lower lobe of the left lung below the carina. Dr. [name] has evaluated this an it appears to be consistent with inflammatory change, most consistent with infection in the interval since his PET scan which did not reveal any findings at this site. The patient's wife reports he's been coughing more frequently,and the patient reports clear sputum. He denies any fevers, or chest pain. He denies any shortness of breath. He has noticed more lower extremity edema however since I saw him, and states that this has been intermittent previously, though is more diffuse and persistent of both lower extremities. He reports normal doppler ultrasound with Dr. Donnetta Hutching at his postop check (following endograft AAA repair) on 8/27. In reviewing his symptoms he describes this at the feet and extending up to the thighs. He does not have any scrotal edema. He denies any abdominal pain at this time.   Wt Readings from Last 3 Encounters:  10/05/16 213 lb 3.2 oz (96.7 kg)  09/30/16 208 lb (94.3 kg)  09/28/16 210 lb (95.3 kg)   Temp Readings from Last 3 Encounters:  10/05/16 98.3 F (36.8 C) (Oral)  09/28/16 98.7 F (37.1 C) (Oral)  09/09/16 97.7 F (36.5 C) (Oral)   BP Readings from Last 3 Encounters:  10/05/16 133/62  09/30/16 138/75  09/28/16 136/82   Pulse Readings from Last 3 Encounters:  10/05/16 84  09/30/16 78  09/28/16 78  In general this is a well appearing caucasian male in no acute distress. He's alert and oriented x4 and appropriate throughout the examination. Cardiopulmonary assessment is negative for acute distress and he exhibits normal effort. Lower extremities reveal 2+ pitting edema bilaterally from the dorsal aspects of his feet to the mid thigh. No calf tenderness is noted, but he has pain at the tibial tuberosities,  L>R.   Impression/Plan: 1. Probable early inflammatory changes in the LLL consistent with infection. We will begin empiric antibiotics and reassess this site with a post treatment CT scan 4 weeks following SBRT. Orders are placed for this.  2. Stage IB cT2aN0M0 NSCLC, squamous cell carcinoma of the LUL. The patient will proceed with SBRT on 10/21/16.  3. Bilateral lower extremity edema. I've enocuraged the patient to reach out to Dr. Gwenlyn Found or Dr. Martinique, cardiologists who he's seen in the past as Dr. Luther Parody out of the office. They will proceed to the ER if they're not able to be seen within the next 24 hours. I also suggested discussing switching his antihypertensive to something different as this edema could be a result of amlodipine. We also discussed the use of compression hose, and to meet with nutrition if this is due to protein calorie malnutrition. The patient declines to meet with nutrition.      Tyler Mcmillan, PAC

## 2016-10-13 NOTE — Progress Notes (Signed)
1110 Brought around after his SBRT CT simulation appointment per Dr. [name].  Vitals signs taken 98.1-78-18 122/61 97%.   Reported having swelling to both legs since having stents placed in his groins abdominal aortic endovascular stent graft procedure on August 1.  Both feet and lower legs with edema, no mention of having difficulty breathing.  Having pain 5/10 to legs at the moment stated the pain is higher at times.  He had an appointment with Dr. Donnetta Hutching 10-04-16 and was told he did not have blood clots after receiving a U/S procedure and was placed on a fluid pill to help with the swelling. He is scheduled for a post op CAT scan on September 11,2018. [place], P.A. came to evaluate Mr. Tyler Mcmillan.  Requested a dietary referral order placed today an appointment for 10-19-16 at 1115 with Jennet Maduro, R.D.

## 2016-10-18 ENCOUNTER — Telehealth: Payer: Self-pay | Admitting: *Deleted

## 2016-10-18 NOTE — Telephone Encounter (Signed)
Returned Forensic psychologist with patient and asked how I could help him, went over his upcoming appts, he stated"Cancel the nutrition appt, I thought that was already done, I don't need it", he is aware of Ct appt tomorrow and SBRT on 10/21/16, informed him I would cancel the nutrition appt ,  9:31 AM

## 2016-10-19 ENCOUNTER — Ambulatory Visit
Admission: RE | Admit: 2016-10-19 | Discharge: 2016-10-19 | Disposition: A | Discharge status: Home / Self Care | Payer: Medicare Other | Source: Ambulatory Visit | Attending: Vascular Surgery | Admitting: Vascular Surgery

## 2016-10-19 DIAGNOSIS — K8020 Calculus of gallbladder without cholecystitis without obstruction: Secondary | ICD-10-CM | POA: Diagnosis not present

## 2016-10-19 DIAGNOSIS — Z95828 Presence of other vascular implants and grafts: Secondary | ICD-10-CM

## 2016-10-19 MED ORDER — IOPAMIDOL (ISOVUE-370) INJECTION 76%
75.0000 mL | Freq: Once | INTRAVENOUS | Status: AC | PRN
  Administered 2016-10-19: 75 mL via INTRAVENOUS

## 2016-10-20 DIAGNOSIS — C3412 Malignant neoplasm of upper lobe, left bronchus or lung: Secondary | ICD-10-CM | POA: Diagnosis not present

## 2016-10-21 ENCOUNTER — Ambulatory Visit
Admission: RE | Admit: 2016-10-21 | Discharge: 2016-10-21 | Disposition: A | Discharge status: Home / Self Care | Payer: Medicare Other | Source: Ambulatory Visit | Attending: Radiation Oncology | Admitting: Radiation Oncology

## 2016-10-21 DIAGNOSIS — C3412 Malignant neoplasm of upper lobe, left bronchus or lung: Secondary | ICD-10-CM

## 2016-10-22 ENCOUNTER — Ambulatory Visit: Payer: Medicare Other

## 2016-10-25 ENCOUNTER — Ambulatory Visit
Admission: RE | Admit: 2016-10-25 | Discharge: 2016-10-25 | Disposition: A | Discharge status: Home / Self Care | Payer: Medicare Other | Source: Ambulatory Visit | Attending: Radiation Oncology | Admitting: Radiation Oncology

## 2016-10-25 DIAGNOSIS — C3412 Malignant neoplasm of upper lobe, left bronchus or lung: Secondary | ICD-10-CM | POA: Diagnosis not present

## 2016-10-26 ENCOUNTER — Encounter: Payer: Self-pay | Admitting: Vascular Surgery

## 2016-10-26 ENCOUNTER — Ambulatory Visit (INDEPENDENT_AMBULATORY_CARE_PROVIDER_SITE_OTHER): Payer: Self-pay | Admitting: Vascular Surgery

## 2016-10-26 VITALS — BP 151/87 | HR 85 | Temp 98.4°F | Resp 20 | Ht 70.0 in | Wt 212.0 lb

## 2016-10-26 DIAGNOSIS — I714 Abdominal aortic aneurysm, without rupture, unspecified: Secondary | ICD-10-CM

## 2016-10-26 DIAGNOSIS — M7989 Other specified soft tissue disorders: Secondary | ICD-10-CM

## 2016-10-26 NOTE — Progress Notes (Signed)
   Patient name: Tyler Mcmillan MRN: [id number] DOB: October 23, 1945 Sex: male  REASON FOR VISIT: CT scan follow-up stent graft repair infrarenal abdominal aortic aneurysm on 09/08/2016  HPI: Tyler Mcmillan is a 71 y.o. male here today for follow-up. He is a very uncomfortable from a progressive bilateral lower extremity edema. This is from his knees distally into his feet with some marked edema. No open ulceration. I'm unclear as to the etiology of this. He underwent duplex in our office-one month ago showing no evidence of DVT. He had a cardiac catheterization in May 2018 showing normal ejection fraction. His renal function is normal. Had suggested diuresis and he reports this gave him no benefit. He does elevate his legs when possible. He is very upset about this. This began around the time of his stent graft and feels that this must be related. I have explained that I'm cannot find any mechanical cause for his swelling and at and M at a loss as why he is having this progressive swelling. He does have lung cancer which is being treated for his well.  Current Outpatient Prescriptions  Medication Sig Dispense Refill  . amLODipine (NORVASC) 2.5 MG tablet Take 1 tablet (2.5 mg total) by mouth daily. 30 tablet 0  . atenolol (TENORMIN) 50 MG tablet Take 50 mg by mouth daily.     . clonazePAM (KLONOPIN) 0.5 MG tablet Take 0.5 mg by mouth 2 (two) times daily.     . furosemide (LASIX) 20 MG tablet Take 1 tablet (20 mg total) by mouth daily. (Patient taking differently: Take 20 mg by mouth as needed. ) 30 tablet 0  . ibuprofen (ADVIL,MOTRIN) 200 MG tablet Take 400 mg by mouth daily as needed for mild pain.     No current facility-administered medications for this visit.      PHYSICAL EXAM: Vitals:   10/26/16 1346  BP: (!) 151/87  Pulse: 85  Resp: 20  Temp: 98.4 F (36.9 C)  SpO2: 92%  Weight: 212 lb (96.2 kg)  Height: 5\' 10"  (1.778 m)    GENERAL: The patient is a  well-nourished male, in no acute distress. The vital signs are documented above. Both groin puncture sites are without false aneurysm. He does have 2-3+ dorsalis pedis pulses bilaterally. He has marked pitting edema from his knees into his feet bilaterally.  CT scan from 10/19/2016 was reviewed and discussed with the patient's. This shows excellent positioning of his stent graft. 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 This is not causing the enlargement of his aneurysm sac. He does not have any evidence of extrinsic compression on his groins bilaterally  MEDICAL ISSUES: Stable status post stent graft repair of abdominal aortic aneurysm. I recommend that we see him with a repeat CT scan one year. He is very disgruntled regarding his leg swelling and generalized debility from this. I have suggested that he discuss this with Dr.Pharrand with Dr. Earlie Server. He left our office stating that this visit was a waste of time. I unfortunately do not have any further recommendations regarding his swelling. I did also discuss compression and he reports that he is unable to wear the garments and I suspect this is case.   [name], MD FACS [facility] Tel [phone] Pager [phone]

## 2016-10-27 ENCOUNTER — Telehealth: Payer: Self-pay | Admitting: *Deleted

## 2016-10-27 NOTE — Telephone Encounter (Signed)
Patient called, requesting to speak with Bryson Ha, about his AAA, he didn't want to talk with anyone but her,  Did let patient know that Bryson Ha was in with consults all afternoon and will ibasket her to call , tried to transfer call to our PA ,no anwser or no voice message to leave 3:47 PM

## 2016-10-28 ENCOUNTER — Ambulatory Visit
Admission: RE | Admit: 2016-10-28 | Discharge: 2016-10-28 | Disposition: A | Discharge status: Home / Self Care | Payer: Medicare Other | Source: Ambulatory Visit | Attending: Radiation Oncology | Admitting: Radiation Oncology

## 2016-10-28 ENCOUNTER — Encounter: Payer: Self-pay | Admitting: Radiation Oncology

## 2016-10-28 DIAGNOSIS — C3412 Malignant neoplasm of upper lobe, left bronchus or lung: Secondary | ICD-10-CM | POA: Diagnosis not present

## 2016-11-01 ENCOUNTER — Telehealth: Payer: Self-pay | Admitting: Radiation Oncology

## 2016-11-01 NOTE — Telephone Encounter (Signed)
LM for the patient to call me back.

## 2016-11-03 NOTE — Progress Notes (Signed)
  Radiation Oncology         (336) [phone] ________________________________  Name: Tyler Mcmillan MRN: [id number]  Date: 10/28/2016  DOB: Mar 20, 1945  End of Treatment Note  Diagnosis:   Stage IB, cT2aN0M0 NSCLC, squamous cell carcinoma of the LUL.     Indication for treatment:  curative       Radiation treatment dates:   10/21/2016 to 10/28/2016  Site/dose:   The Left lung was treated to 54 Gy in 3 fractions at 18 Gy per fraction.   Beams/energy:   SBRT-3D // 6X  Narrative: The patient tolerated radiation treatment relatively well.   Denies any pain. Reports some fatigue. Denies any shortness of breath, difficulty swallowing, or coughing. Denies any skin issues.   Plan: The patient has completed radiation treatment. The patient will return to radiation oncology clinic for routine followup in one month. I advised them to call or return sooner if they have any questions or concerns related to their recovery or treatment.  ------------------------------------------------  Jodelle Gross, MD, PhD  This document serves as a record of services personally performed by Kyung Rudd, MD. It was created on his behalf by Arlyce Harman, a trained medical scribe. The creation of this record is based on the scribe's personal observations and the provider's statements to them. This document has been checked and approved by the attending provider.

## 2016-11-16 DIAGNOSIS — R1031 Right lower quadrant pain: Secondary | ICD-10-CM | POA: Diagnosis not present

## 2016-11-16 DIAGNOSIS — R600 Localized edema: Secondary | ICD-10-CM | POA: Diagnosis not present

## 2016-11-17 ENCOUNTER — Other Ambulatory Visit (HOSPITAL_COMMUNITY): Payer: Self-pay | Admitting: Internal Medicine

## 2016-11-17 ENCOUNTER — Ambulatory Visit (HOSPITAL_COMMUNITY)
Admission: RE | Admit: 2016-11-17 | Discharge: 2016-11-17 | Disposition: A | Discharge status: Home / Self Care | Payer: Medicare Other | Source: Ambulatory Visit | Attending: Vascular Surgery | Admitting: Vascular Surgery

## 2016-11-17 DIAGNOSIS — R1031 Right lower quadrant pain: Secondary | ICD-10-CM | POA: Diagnosis not present

## 2016-11-17 DIAGNOSIS — R600 Localized edema: Secondary | ICD-10-CM | POA: Insufficient documentation

## 2016-11-20 ENCOUNTER — Encounter (HOSPITAL_COMMUNITY): Payer: Self-pay | Admitting: Emergency Medicine

## 2016-11-20 ENCOUNTER — Emergency Department (HOSPITAL_COMMUNITY): Payer: Medicare Other

## 2016-11-20 ENCOUNTER — Emergency Department (HOSPITAL_COMMUNITY)
Admission: EM | Admit: 2016-11-20 | Discharge: 2016-11-20 | Disposition: A | Discharge status: Home / Self Care | Payer: Medicare Other | Attending: Emergency Medicine | Admitting: Emergency Medicine

## 2016-11-20 DIAGNOSIS — Z79899 Other long term (current) drug therapy: Secondary | ICD-10-CM | POA: Insufficient documentation

## 2016-11-20 DIAGNOSIS — Y929 Unspecified place or not applicable: Secondary | ICD-10-CM | POA: Insufficient documentation

## 2016-11-20 DIAGNOSIS — W0110XA Fall on same level from slipping, tripping and stumbling with subsequent striking against unspecified object, initial encounter: Secondary | ICD-10-CM | POA: Diagnosis not present

## 2016-11-20 DIAGNOSIS — M5136 Other intervertebral disc degeneration, lumbar region: Secondary | ICD-10-CM

## 2016-11-20 DIAGNOSIS — R1031 Right lower quadrant pain: Secondary | ICD-10-CM | POA: Diagnosis present

## 2016-11-20 DIAGNOSIS — I10 Essential (primary) hypertension: Secondary | ICD-10-CM | POA: Diagnosis not present

## 2016-11-20 DIAGNOSIS — Y939 Activity, unspecified: Secondary | ICD-10-CM | POA: Insufficient documentation

## 2016-11-20 DIAGNOSIS — M8560 Other cyst of bone, unspecified site: Secondary | ICD-10-CM

## 2016-11-20 DIAGNOSIS — Z8551 Personal history of malignant neoplasm of bladder: Secondary | ICD-10-CM | POA: Diagnosis not present

## 2016-11-20 DIAGNOSIS — M48061 Spinal stenosis, lumbar region without neurogenic claudication: Secondary | ICD-10-CM | POA: Diagnosis not present

## 2016-11-20 DIAGNOSIS — S0990XA Unspecified injury of head, initial encounter: Secondary | ICD-10-CM | POA: Insufficient documentation

## 2016-11-20 DIAGNOSIS — Z85118 Personal history of other malignant neoplasm of bronchus and lung: Secondary | ICD-10-CM | POA: Insufficient documentation

## 2016-11-20 DIAGNOSIS — Y999 Unspecified external cause status: Secondary | ICD-10-CM | POA: Diagnosis not present

## 2016-11-20 DIAGNOSIS — R1030 Lower abdominal pain, unspecified: Secondary | ICD-10-CM | POA: Insufficient documentation

## 2016-11-20 DIAGNOSIS — F17210 Nicotine dependence, cigarettes, uncomplicated: Secondary | ICD-10-CM | POA: Insufficient documentation

## 2016-11-20 DIAGNOSIS — I517 Cardiomegaly: Secondary | ICD-10-CM | POA: Diagnosis not present

## 2016-11-20 DIAGNOSIS — M25551 Pain in right hip: Secondary | ICD-10-CM | POA: Diagnosis not present

## 2016-11-20 DIAGNOSIS — S79911A Unspecified injury of right hip, initial encounter: Secondary | ICD-10-CM | POA: Diagnosis not present

## 2016-11-20 DIAGNOSIS — R609 Edema, unspecified: Secondary | ICD-10-CM | POA: Diagnosis not present

## 2016-11-20 DIAGNOSIS — R1032 Left lower quadrant pain: Secondary | ICD-10-CM

## 2016-11-20 DIAGNOSIS — M79609 Pain in unspecified limb: Secondary | ICD-10-CM | POA: Diagnosis not present

## 2016-11-20 LAB — URINALYSIS, ROUTINE W REFLEX MICROSCOPIC
BACTERIA UA: NONE SEEN
BILIRUBIN URINE: NEGATIVE
Glucose, UA: NEGATIVE mg/dL
KETONES UR: NEGATIVE mg/dL
LEUKOCYTES UA: NEGATIVE
NITRITE: NEGATIVE
PROTEIN: NEGATIVE mg/dL
SQUAMOUS EPITHELIAL / LPF: NONE SEEN
Specific Gravity, Urine: 1.006 (ref 1.005–1.030)
WBC UA: NONE SEEN WBC/hpf (ref 0–5)
pH: 5 (ref 5.0–8.0)

## 2016-11-20 LAB — CBC WITH DIFFERENTIAL/PLATELET
Basophils Absolute: 0 10*3/uL (ref 0.0–0.1)
Basophils Relative: 0 %
EOS ABS: 0.1 10*3/uL (ref 0.0–0.7)
Eosinophils Relative: 1 %
HCT: 42.5 % (ref 39.0–52.0)
HEMOGLOBIN: 14.8 g/dL (ref 13.0–17.0)
Lymphocytes Relative: 4 %
Lymphs Abs: 0.5 10*3/uL — ABNORMAL LOW (ref 0.7–4.0)
MCH: 35.7 pg — ABNORMAL HIGH (ref 26.0–34.0)
MCHC: 34.8 g/dL (ref 30.0–36.0)
MCV: 102.7 fL — ABNORMAL HIGH (ref 78.0–100.0)
MONOS PCT: 9 %
Monocytes Absolute: 1 10*3/uL (ref 0.1–1.0)
NEUTROS PCT: 86 %
Neutro Abs: 10.4 10*3/uL — ABNORMAL HIGH (ref 1.7–7.7)
Platelets: 203 10*3/uL (ref 150–400)
RBC: 4.14 MIL/uL — AB (ref 4.22–5.81)
RDW: 13.3 % (ref 11.5–15.5)
WBC: 12 10*3/uL — AB (ref 4.0–10.5)

## 2016-11-20 LAB — PROTIME-INR
INR: 1.07
PROTHROMBIN TIME: 13.8 s (ref 11.4–15.2)

## 2016-11-20 LAB — I-STAT CHEM 8, ED
BUN: 8 mg/dL (ref 6–20)
CALCIUM ION: 0.98 mmol/L — AB (ref 1.15–1.40)
CHLORIDE: 86 mmol/L — AB (ref 101–111)
CREATININE: 0.8 mg/dL (ref 0.61–1.24)
GLUCOSE: 125 mg/dL — AB (ref 65–99)
HEMATOCRIT: 48 % (ref 39.0–52.0)
Hemoglobin: 16.3 g/dL (ref 13.0–17.0)
POTASSIUM: 3.8 mmol/L (ref 3.5–5.1)
SODIUM: 125 mmol/L — AB (ref 135–145)
TCO2: 24 mmol/L (ref 22–32)

## 2016-11-20 MED ORDER — TETANUS-DIPHTH-ACELL PERTUSSIS 5-2.5-18.5 LF-MCG/0.5 IM SUSP
0.5000 mL | Freq: Once | INTRAMUSCULAR | Status: AC
  Administered 2016-11-20: 0.5 mL via INTRAMUSCULAR
  Filled 2016-11-20: qty 0.5

## 2016-11-20 MED ORDER — FENTANYL CITRATE (PF) 100 MCG/2ML IJ SOLN
50.0000 ug | Freq: Once | INTRAMUSCULAR | Status: AC
  Administered 2016-11-20: 50 ug via INTRAVENOUS
  Filled 2016-11-20: qty 2

## 2016-11-20 MED ORDER — KETOROLAC TROMETHAMINE 30 MG/ML IJ SOLN
15.0000 mg | Freq: Once | INTRAMUSCULAR | Status: AC
  Administered 2016-11-20: 15 mg via INTRAVENOUS
  Filled 2016-11-20: qty 1

## 2016-11-20 MED ORDER — SODIUM CHLORIDE 0.9 % IV BOLUS (SEPSIS)
500.0000 mL | Freq: Once | INTRAVENOUS | Status: AC
  Administered 2016-11-20: 500 mL via INTRAVENOUS

## 2016-11-20 MED ORDER — DICLOFENAC SODIUM ER 100 MG PO TB24: 100.0000 mg | ORAL_TABLET | Freq: Every day | ORAL | 0 refills | Status: DC

## 2016-11-20 NOTE — ED Notes (Signed)
Patient transported to X-ray 

## 2016-11-20 NOTE — ED Triage Notes (Signed)
Patient arrives from home with complaint of right groin pain. States that he was sitting at home tonight, saw a spider on the floor, stood to kill the spider, bent over, and his "right leg gave out". History of AAA with graft and stent placement on 09/08/16. Patient states he had been pain free, but pain in bilateral groins started 2 weeks ago. Has seen his vascular doctors since that time; 2 dopplers done over the last week for rule out DVT in bilateral LE; patient reports those were negative. Edema noted to both LE, patient states it appears better than usual. Palpable pedal pulses bilaterally.

## 2016-11-20 NOTE — ED Notes (Signed)
Pt ambulated, without difficulty, around Pod A's nurse's station. Standby assist only.

## 2016-11-20 NOTE — ED Provider Notes (Addendum)
[facility] DEPT Provider Note   CSN: [id number] Arrival date & time: 11/20/16  0132     History   Chief Complaint Chief Complaint  Patient presents with  . Groin Pain    HPI Tyler Mcmillan is a 71 y.o. male.  The history is provided by the patient.  Groin Pain  This is a chronic problem. The current episode started more than 1 week ago (Since surgery the first week of August ). The problem occurs constantly. The problem has not changed since onset.Pertinent negatives include no chest pain, no abdominal pain, no headaches and no shortness of breath. Nothing aggravates the symptoms. Nothing relieves the symptoms. He has tried nothing for the symptoms. The treatment provided no relief.  States he feel this evening secondary to pain.  No f/c/r.  No weakness or numbness.  Has hyperesthesia in the groin and legs ruled out for DVTS x 2 since surgery.    Past Medical History:  Diagnosis Date  . AAA (abdominal aortic aneurysm) without rupture ([place])    [place] ABD.  DONE  JUNE 2014  3.5  . Bladder cancer ([place])   . Bladder neoplasm   . Chronic anxiety   . GERD (gastroesophageal reflux disease)   . Hypertension   . 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 Hypertensive heart disease without heart failure   . Chest pain 07/02/2016  . Cavitary lesion of lung 07/02/2016  . Unstable angina ([place])   . AAA (abdominal aortic aneurysm) ([place]) 07/09/2014  . Lumbar degenerative disc disease 07/09/2014    Past Surgical History:  Procedure Laterality Date  . ABDOMINAL AORTIC ENDOVASCULAR STENT GRAFT N/A 09/08/2016   Procedure: ABDOMINAL AORTIC ENDOVASCULAR STENT GRAFT;  Surgeon: [name], MD;  Location: [facility];  Service: Vascular;  Laterality: N/A;  . CYSTOSCOPY W/ URETERAL STENT PLACEMENT Left 10/04/2012   Procedure: CYSTOSCOPY WITH RETROGRADE  PYELOGRAM/URETERAL STENT PLACEMENT   "POSSIBLE LEFT STENT";  Surgeon: Alexis Frock, MD;  Location: [facility];  Service: Urology;  Laterality: Left;  . LEFT HEART CATH AND CORONARY ANGIOGRAPHY N/A 07/02/2016   Procedure: Left Heart Cath and Coronary Angiography;  Surgeon: Martinique, Peter M, MD;  Location: [facility];  Service: Cardiovascular;  Laterality: N/A;  . MEATOTOMY N/A 10/04/2012   Procedure: MEATOTOMY ADULT;  Surgeon: Alexis Frock, MD;  Location: [facility];  Service: Urology;  Laterality: N/A;  . TRANSURETHRAL RESECTION OF BLADDER TUMOR WITH GYRUS (TURBT-GYRUS) N/A 10/04/2012   Procedure: TRANSURETHRAL RESECTION OF BLADDER TUMOR WITH GYRUS (TURBT-GYRUS);  Surgeon: Alexis Frock, MD;  Location: [facility];  Service: Urology;  Laterality: N/A;  . VIDEO BRONCHOSCOPY Bilateral 07/19/2016   Procedure: VIDEO BRONCHOSCOPY WITH FLUORO;  Surgeon: Marshell Garfinkel, MD;  Location: [facility];  Service: Cardiopulmonary;  Laterality: Bilateral;       Home Medications    Prior to Admission medications   Medication Sig Start Date End Date Taking? Authorizing Provider  amLODipine (NORVASC) 2.5 MG tablet Take 1 tablet (2.5 mg total) by mouth daily. 07/04/16  Yes Hosie Poisson, MD  atenolol (TENORMIN) 50 MG tablet Take 50 mg by mouth daily.    Yes [provider]  clonazePAM (KLONOPIN) 0.5 MG tablet Take 0.5 mg by mouth 2 (two) times daily.    Yes [provider]  ibuprofen (ADVIL,MOTRIN)  200 MG tablet Take 400 mg by mouth daily as needed for mild pain.   Yes [provider]  triamterene-hydrochlorothiazide (MAXZIDE-25) 37.5-25 MG tablet Take 1 tablet by mouth daily.   Yes [provider]  Diclofenac Sodium CR (VOLTAREN-XR) 100 MG 24 hr tablet Take 1 tablet (100 mg total) by mouth daily. 11/20/16   Wilma Michaelson, MD    Family History Family History  Problem Relation Age of Onset  . Cancer Mother   .  Heart disease Father        before age 15  . Heart attack Father   . Cancer Brother     Social History Social History  Substance Use Topics  . Smoking status: Current Every Day Smoker    Packs/day: 0.50    Years: 50.00    Types: Cigarettes  . Smokeless tobacco: Never Used     Comment: pt refuses info  . Alcohol use 14.4 oz/week    10 Glasses of wine, 14 Cans of beer per week     Comment: few glases wine per day, beer     Allergies   Patient has no known allergies.   Review of Systems Review of Systems  Respiratory: Negative for shortness of breath.   Cardiovascular: Negative for chest pain.  Gastrointestinal: Negative for abdominal pain.  Genitourinary: Negative for difficulty urinating, frequency and hematuria.  Musculoskeletal: Positive for arthralgias. Negative for back pain and joint swelling.  Neurological: Negative for headaches.  All other systems reviewed and are negative.    Physical Exam Updated Vital Signs BP (!) 145/77   Pulse 89   Temp 97.6 F (36.4 C) (Oral)   Resp 14   SpO2 94%   Physical Exam  Constitutional: He is oriented to person, place, and time. He appears well-developed and well-nourished. No distress.  HENT:  Head: Normocephalic and atraumatic.  Mouth/Throat: No oropharyngeal exudate.  Eyes: Pupils are equal, round, and reactive to light. Conjunctivae are normal.  Neck: Normal range of motion. Neck supple.  Cardiovascular: Normal rate, regular rhythm, normal heart sounds and intact distal pulses.   Intact dorsalis pedis B  Pulmonary/Chest: Effort normal and breath sounds normal. No stridor. He has no wheezes. He has no rales.  Abdominal: Soft. Bowel sounds are normal. He exhibits no mass. There is no tenderness. There is no rebound and no guarding.  Musculoskeletal: Normal range of motion. He exhibits no edema or tenderness.       Right hip: He exhibits normal range of motion, normal strength, no tenderness, no bony tenderness, no  swelling, no crepitus, no deformity and no laceration.       Right knee: Normal.       Left knee: Normal.       Right ankle: Normal. Achilles tendon normal.       Left ankle: Normal.       Cervical back: Normal.       Thoracic back: Normal.       Lumbar back: Normal.  No foreshortening no rotation, ambulated about the department without limp or pain  Neurological: He is alert and oriented to person, place, and time. He displays normal reflexes. Coordination normal.  Skin: Skin is warm and dry. Capillary refill takes less than 2 seconds.  Psychiatric: He has a normal mood and affect.     ED Treatments / Results   Vitals:   11/20/16 0545 11/20/16 0600  BP: (!) 147/80 (!) 145/77  Pulse: 91 89  Resp:    Temp:  SpO2: 94% 94%    Labs (all labs ordered are listed, but only abnormal results are displayed)  Results for orders placed or performed during the hospital encounter of 11/20/16  CBC with Differential/Platelet  Result Value Ref Range   WBC 12.0 (H) 4.0 - 10.5 K/uL   RBC 4.14 (L) 4.22 - 5.81 MIL/uL   Hemoglobin 14.8 13.0 - 17.0 g/dL   HCT 42.5 39.0 - 52.0 %   MCV 102.7 (H) 78.0 - 100.0 fL   MCH 35.7 (H) 26.0 - 34.0 pg   MCHC 34.8 30.0 - 36.0 g/dL   RDW 13.3 11.5 - 15.5 %   Platelets 203 150 - 400 K/uL   Neutrophils Relative % 86 %   Neutro Abs 10.4 (H) 1.7 - 7.7 K/uL   Lymphocytes Relative 4 %   Lymphs Abs 0.5 (L) 0.7 - 4.0 K/uL   Monocytes Relative 9 %   Monocytes Absolute 1.0 0.1 - 1.0 K/uL   Eosinophils Relative 1 %   Eosinophils Absolute 0.1 0.0 - 0.7 K/uL   Basophils Relative 0 %   Basophils Absolute 0.0 0.0 - 0.1 K/uL  Protime-INR  Result Value Ref Range   Prothrombin Time 13.8 11.4 - 15.2 seconds   INR 1.07   Urinalysis, Routine w reflex microscopic  Result Value Ref Range   Color, Urine YELLOW YELLOW   APPearance CLEAR CLEAR   Specific Gravity, Urine 1.006 1.005 - 1.030   pH 5.0 5.0 - 8.0   Glucose, UA NEGATIVE NEGATIVE mg/dL   Hgb urine dipstick  SMALL (A) NEGATIVE   Bilirubin Urine NEGATIVE NEGATIVE   Ketones, ur NEGATIVE NEGATIVE mg/dL   Protein, ur NEGATIVE NEGATIVE mg/dL   Nitrite NEGATIVE NEGATIVE   Leukocytes, UA NEGATIVE NEGATIVE   RBC / HPF 0-5 0 - 5 RBC/hpf   WBC, UA NONE SEEN 0 - 5 WBC/hpf   Bacteria, UA NONE SEEN NONE SEEN   Squamous Epithelial / LPF NONE SEEN NONE SEEN   Hyaline Casts, UA PRESENT   I-Stat Chem 8, ED  Result Value Ref Range   Sodium 125 (L) 135 - 145 mmol/L   Potassium 3.8 3.5 - 5.1 mmol/L   Chloride 86 (L) 101 - 111 mmol/L   BUN 8 6 - 20 mg/dL   Creatinine, Ser 0.80 0.61 - 1.24 mg/dL   Glucose, Bld 125 (H) 65 - 99 mg/dL   Calcium, Ion 0.98 (L) 1.15 - 1.40 mmol/L   TCO2 24 22 - 32 mmol/L   Hemoglobin 16.3 13.0 - 17.0 g/dL   HCT 48.0 39.0 - 52.0 %   Dg Chest 2 View  Result Date: 11/20/2016 CLINICAL DATA:  Preoperative evaluation. EXAM: CHEST  2 VIEW COMPARISON:  Prior radiograph from 09/08/2016. FINDINGS: Mild cardiomegaly, stable. Mediastinal silhouette normal. Aortic atherosclerosis. Lungs normally inflated. Cavitary lesion at the left upper lobe is relatively stable from previous. No new focal infiltrates. No pulmonary edema or pleural effusion. No pneumothorax. No acute osseus abnormality. Mild chronic wedging within the midthoracic spine noted. IMPRESSION: 1. No significant interval change in cavitary left upper lobe lesion. 2. No other active cardiopulmonary disease. 3. Stable cardiomegaly with aortic atherosclerosis. Electronically Signed   By: Jeannine Boga M.D.   On: 11/20/2016 04:38   Ct Head Wo Contrast  Result Date: 11/20/2016 CLINICAL DATA:  Status post fall, with concern for head injury. Initial encounter. EXAM: CT HEAD WITHOUT CONTRAST TECHNIQUE: Contiguous axial images were obtained from the base of the skull through the vertex without intravenous contrast.  COMPARISON:  MRI of the brain performed 08/16/2016 FINDINGS: Brain: No evidence of acute infarction, hemorrhage,  hydrocephalus, extra-axial collection or mass lesion/mass effect. Prominence of the ventricles and sulci reflects mild to moderate cortical volume loss. Mild periventricular white matter change likely reflects small vessel ischemic microangiopathy. Mild cerebellar atrophy is noted. The brainstem and fourth ventricle are within normal limits. The basal ganglia are unremarkable in appearance. The cerebral hemispheres demonstrate grossly normal gray-white differentiation. No mass effect or midline shift is seen. Vascular: No hyperdense vessel or unexpected calcification. Skull: There is no evidence of fracture; visualized osseous structures are unremarkable in appearance. Sinuses/Orbits: The orbits are within normal limits. The paranasal sinuses and mastoid air cells are well-aerated. Other: No significant soft tissue abnormalities are seen. IMPRESSION: 1. No evidence of traumatic intracranial injury or fracture. 2. Mild to moderate cortical volume loss and scattered small vessel ischemic microangiopathy. Electronically Signed   By: Garald Balding M.D.   On: 11/20/2016 03:58   Ct Lumbar Spine Wo Contrast  Result Date: 11/20/2016 CLINICAL DATA:  Initial evaluation for acute right groin pain. EXAM: CT LUMBAR SPINE WITHOUT CONTRAST TECHNIQUE: Multidetector CT imaging of the lumbar spine was performed without intravenous contrast administration. Multiplanar CT image reconstructions were also generated. COMPARISON:  Prior CT from 10/19/2016. FINDINGS: Segmentation: Normal segmentation. Lowest well-formed disc labeled the L5-S1 level. Alignment: Trace 2 mm anterolisthesis of L4 on L5. Trace retrolisthesis of L5 on S1. Vertebral bodies otherwise normally aligned with preservation of the normal lumbar lordosis. Vertebrae: Vertebral body heights are maintained. No evidence for acute or chronic fracture. No discrete lytic or blastic osseous lesions. Visualized sacrum intact. SI joints approximated and symmetric. Paraspinal  and other soft tissues: Paraspinous soft tissues demonstrate no acute abnormality. Chronic fatty atrophy noted within the lower paraspinous musculature. Intra-abdominal aneurysm with aorto bi-iliac stent endograft in place. Disc levels: L1-2:  Mild bilateral facet hypertrophy.  Otherwise negative. L2-3: Mild annular disc bulge. Minimal facet hypertrophy. No stenosis. L3-4: Mild diffuse disc bulge. Mild facet and ligamentum flavum hypertrophy. No canal or neural foraminal stenosis. L4-5: Trace 2 mm anterolisthesis. Associated diffuse disc bulge. Advanced bilateral facet arthrosis, right greater than left. Resultant mild canal with moderate bilateral subarticular stenosis. Either of the descending L5 nerve roots could be affected. Foramina are elongated but remain relatively patent. L5-S1: Trace retrolisthesis. Diffuse disc bulge with disc desiccation and intervertebral disc space narrowing. Chronic reactive endplate changes, slightly greater on the right. Posterior disc bulge closely approximates the descending S1 nerve roots without neural impingement or displacement. Mild bilateral facet arthrosis. Epidural lipomatosis with a trefoil appearance of the thecal sac. No other canal stenosis. Moderate right L5 foraminal narrowing. IMPRESSION: 1. No acute abnormality within the lumbar spine. 2. 2 mm anterolisthesis of L4 on L5 with associated disc bulge and advanced facet arthropathy, resulting in moderate bilateral lateral recess stenosis. Either of the descending L5 nerve roots could be affected. 3. Shallow posterior disc bulge at L5-S1, closely approximating the descending S1 nerve roots without neural impingement or displacement. 4. Moderate right L5 foraminal stenosis related to disc bulge, reactive endplate changes, and facet disease. 5. 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 Electronically Signed   By: Jeannine Boga M.D.   On: 11/20/2016 04:15   Ct Hip Right Wo  Contrast  Result Date: 11/20/2016 CLINICAL DATA:  Acute onset of right groin pain after fall. Initial encounter. EXAM: CT OF THE RIGHT HIP WITHOUT CONTRAST TECHNIQUE: Multidetector CT imaging of the  right hip was performed according to the standard protocol. Multiplanar CT image reconstructions were also generated. COMPARISON:  CT of the abdomen and pelvis performed 10/19/2016 FINDINGS: Bones/Joint/Cartilage The suspected fracture on right hip radiograph is not well seen on CT. It may have been artifactual in nature secondary to right femoral head osteophytes, or it may simply be less conspicuous on CT imaging. Subcortical cyst formation is noted at the anterior aspect of the right femoral head and at the superior acetabulum. No hip joint effusion is seen at this time. The cartilage is not well assessed on CT. Ligaments Suboptimally assessed by CT. Muscles and Tendons The visualized musculature is grossly unremarkable. No focal tendon abnormalities are seen. Soft tissues There is mild soft tissue injury overlying the right greater femoral trochanter. Scattered vascular calcifications are seen. A small to moderate right inguinal hernia is noted, containing only fat. The bladder is mildly distended and grossly unremarkable. IMPRESSION: 1. Suspected fracture on right hip radiograph is not well seen on CT. It may have been artifactual in nature secondary to right femoral head osteophytes, or it may simply be less conspicuous on CT imaging. If there is significant persistent clinical concern for fracture, MRI of the right hip could be considered for further evaluation. 2. Mild subcortical cyst formation at the right hip joint, without significant joint space narrowing. 3. Mild soft tissue injury overlying the right greater femoral trochanter. 4. Small to moderate right inguinal hernia, containing only fat. 5. Scattered vascular calcifications seen. Electronically Signed   By: Garald Balding M.D.   On: 11/20/2016 04:12    Dg Hip Unilat  With Pelvis 2-3 Views Right  Result Date: 11/20/2016 CLINICAL DATA:  Status post fall, with right hip pain. Initial encounter. EXAM: DG HIP (WITH OR WITHOUT PELVIS) 2-3V RIGHT COMPARISON:  None. FINDINGS: There is question of a minimally displaced subcapital fracture of the right femoral neck. Both femoral heads are seated normally within their respective acetabula. No significant degenerative change is appreciated. The sacroiliac joints are unremarkable in appearance. The visualized bowel gas pattern is grossly unremarkable in appearance. Scattered phleboliths are noted within the pelvis. IMPRESSION: Question of minimally displaced subcapital fracture of the right femoral neck, seen only on a single image. This could be better assessed on MRI of the right hip, as deemed clinically appropriate. Electronically Signed   By: Garald Balding M.D.   On: 11/20/2016 02:45   Radiology Dg Chest 2 View  Result Date: 11/20/2016 CLINICAL DATA:  Preoperative evaluation. EXAM: CHEST  2 VIEW COMPARISON:  Prior radiograph from 09/08/2016. FINDINGS: Mild cardiomegaly, stable. Mediastinal silhouette normal. Aortic atherosclerosis. Lungs normally inflated. Cavitary lesion at the left upper lobe is relatively stable from previous. No new focal infiltrates. No pulmonary edema or pleural effusion. No pneumothorax. No acute osseus abnormality. Mild chronic wedging within the midthoracic spine noted. IMPRESSION: 1. No significant interval change in cavitary left upper lobe lesion. 2. No other active cardiopulmonary disease. 3. Stable cardiomegaly with aortic atherosclerosis. Electronically Signed   By: Jeannine Boga M.D.   On: 11/20/2016 04:38   Ct Head Wo Contrast  Result Date: 11/20/2016 CLINICAL DATA:  Status post fall, with concern for head injury. Initial encounter. EXAM: CT HEAD WITHOUT CONTRAST TECHNIQUE: Contiguous axial images were obtained from the base of the skull through the vertex  without intravenous contrast. COMPARISON:  MRI of the brain performed 08/16/2016 FINDINGS: Brain: No evidence of acute infarction, hemorrhage, hydrocephalus, extra-axial collection or mass lesion/mass effect. Prominence of the ventricles  and sulci reflects mild to moderate cortical volume loss. Mild periventricular white matter change likely reflects small vessel ischemic microangiopathy. Mild cerebellar atrophy is noted. The brainstem and fourth ventricle are within normal limits. The basal ganglia are unremarkable in appearance. The cerebral hemispheres demonstrate grossly normal gray-white differentiation. No mass effect or midline shift is seen. Vascular: No hyperdense vessel or unexpected calcification. Skull: There is no evidence of fracture; visualized osseous structures are unremarkable in appearance. Sinuses/Orbits: The orbits are within normal limits. The paranasal sinuses and mastoid air cells are well-aerated. Other: No significant soft tissue abnormalities are seen. IMPRESSION: 1. No evidence of traumatic intracranial injury or fracture. 2. Mild to moderate cortical volume loss and scattered small vessel ischemic microangiopathy. Electronically Signed   By: Garald Balding M.D.   On: 11/20/2016 03:58   Ct Lumbar Spine Wo Contrast  Result Date: 11/20/2016 CLINICAL DATA:  Initial evaluation for acute right groin pain. EXAM: CT LUMBAR SPINE WITHOUT CONTRAST TECHNIQUE: Multidetector CT imaging of the lumbar spine was performed without intravenous contrast administration. Multiplanar CT image reconstructions were also generated. COMPARISON:  Prior CT from 10/19/2016. FINDINGS: Segmentation: Normal segmentation. Lowest well-formed disc labeled the L5-S1 level. Alignment: Trace 2 mm anterolisthesis of L4 on L5. Trace retrolisthesis of L5 on S1. Vertebral bodies otherwise normally aligned with preservation of the normal lumbar lordosis. Vertebrae: Vertebral body heights are maintained. No evidence for acute  or chronic fracture. No discrete lytic or blastic osseous lesions. Visualized sacrum intact. SI joints approximated and symmetric. Paraspinal and other soft tissues: Paraspinous soft tissues demonstrate no acute abnormality. Chronic fatty atrophy noted within the lower paraspinous musculature. Intra-abdominal aneurysm with aorto bi-iliac stent endograft in place. Disc levels: L1-2:  Mild bilateral facet hypertrophy.  Otherwise negative. L2-3: Mild annular disc bulge. Minimal facet hypertrophy. No stenosis. L3-4: Mild diffuse disc bulge. Mild facet and ligamentum flavum hypertrophy. No canal or neural foraminal stenosis. L4-5: Trace 2 mm anterolisthesis. Associated diffuse disc bulge. Advanced bilateral facet arthrosis, right greater than left. Resultant mild canal with moderate bilateral subarticular stenosis. Either of the descending L5 nerve roots could be affected. Foramina are elongated but remain relatively patent. L5-S1: Trace retrolisthesis. Diffuse disc bulge with disc desiccation and intervertebral disc space narrowing. Chronic reactive endplate changes, slightly greater on the right. Posterior disc bulge closely approximates the descending S1 nerve roots without neural impingement or displacement. Mild bilateral facet arthrosis. Epidural lipomatosis with a trefoil appearance of the thecal sac. No other canal stenosis. Moderate right L5 foraminal narrowing. IMPRESSION: 1. No acute abnormality within the lumbar spine. 2. 2 mm anterolisthesis of L4 on L5 with associated disc bulge and advanced facet arthropathy, resulting in moderate bilateral lateral recess stenosis. Either of the descending L5 nerve roots could be affected. 3. Shallow posterior disc bulge at L5-S1, closely approximating the descending S1 nerve roots without neural impingement or displacement. 4. Moderate right L5 foraminal stenosis related to disc bulge, reactive endplate changes, and facet disease. 5. Intra-abdominal aortic aneurysm with  aorto bi-iliac stent endograft in place. Electronically Signed   By: Jeannine Boga M.D.   On: 11/20/2016 04:15   Ct Hip Right Wo Contrast  Result Date: 11/20/2016 CLINICAL DATA:  Acute onset of right groin pain after fall. Initial encounter. EXAM: CT OF THE RIGHT HIP WITHOUT CONTRAST TECHNIQUE: Multidetector CT imaging of the right hip was performed according to the standard protocol. Multiplanar CT image reconstructions were also generated. COMPARISON:  CT of the abdomen and pelvis performed 10/19/2016 FINDINGS: Bones/Joint/Cartilage  The suspected fracture on right hip radiograph is not well seen on CT. It may have been artifactual in nature secondary to right femoral head osteophytes, or it may simply be less conspicuous on CT imaging. Subcortical cyst formation is noted at the anterior aspect of the right femoral head and at the superior acetabulum. No hip joint effusion is seen at this time. The cartilage is not well assessed on CT. Ligaments Suboptimally assessed by CT. Muscles and Tendons The visualized musculature is grossly unremarkable. No focal tendon abnormalities are seen. Soft tissues There is mild soft tissue injury overlying the right greater femoral trochanter. Scattered vascular calcifications are seen. A small to moderate right inguinal hernia is noted, containing only fat. The bladder is mildly distended and grossly unremarkable. IMPRESSION: 1. Suspected fracture on right hip radiograph is not well seen on CT. It may have been artifactual in nature secondary to right femoral head osteophytes, or it may simply be less conspicuous on CT imaging. If there is significant persistent clinical concern for fracture, MRI of the right hip could be considered for further evaluation. 2. Mild subcortical cyst formation at the right hip joint, without significant joint space narrowing. 3. Mild soft tissue injury overlying the right greater femoral trochanter. 4. Small to moderate right inguinal  hernia, containing only fat. 5. Scattered vascular calcifications seen. Electronically Signed   By: Garald Balding M.D.   On: 11/20/2016 04:12   Dg Hip Unilat  With Pelvis 2-3 Views Right  Result Date: 11/20/2016 CLINICAL DATA:  Status post fall, with right hip pain. Initial encounter. EXAM: DG HIP (WITH OR WITHOUT PELVIS) 2-3V RIGHT COMPARISON:  None. FINDINGS: There is question of a minimally displaced subcapital fracture of the right femoral neck. Both femoral heads are seated normally within their respective acetabula. No significant degenerative change is appreciated. The sacroiliac joints are unremarkable in appearance. The visualized bowel gas pattern is grossly unremarkable in appearance. Scattered phleboliths are noted within the pelvis. IMPRESSION: Question of minimally displaced subcapital fracture of the right femoral neck, seen only on a single image. This could be better assessed on MRI of the right hip, as deemed clinically appropriate. Electronically Signed   By: Garald Balding M.D.   On: 11/20/2016 02:45    Procedures Procedures (including critical care time)  Medications Ordered in ED Medications  fentaNYL (SUBLIMAZE) injection 50 mcg (50 mcg Intravenous Given 11/20/16 0407)  Tdap (BOOSTRIX) injection 0.5 mL (0.5 mLs Intramuscular Given 11/20/16 0407)  ketorolac (TORADOL) 30 MG/ML injection 15 mg (15 mg Intravenous Given 11/20/16 0548)  sodium chloride 0.9 % bolus 500 mL (0 mLs Intravenous Stopped 11/20/16 0623)    Patient is denying any acute hip pain from the fall. No pain with ambulation and ambulated about the department.  Informed patient of results and he states "I didn't hurt my hip when I fell. I've had pain in both legs since my surgery."  Patient just wants to know what is causing his ongoing pain.  I do not believe the patient fractured his hip and have shared findings with patient and we will not proceed with MRI.  Referral to ortho for DDD.  Strict return precautions  given for worsening hip pain or foreshortening.    Final Clinical Impressions(s) / ED Diagnoses   Final diagnoses:  Bilateral groin pain  Bone cyst  DDD (degenerative disc disease), lumbar   Will refer to orthopedics, for DDD.  May need to see neuro for nerve conduction studies or the legs, but  I suspect the patient needs B12/folate levels as patient is an alcoholic and some of the issues may be due to B12 deficiency as patient is reporting foot pain as well. Has intact groin pulses and has B dorsalis pedis    Strict return precautions given for  Shortness of breath, swelling or the lips or tongue, chest pain, dyspnea on exertion, new weakness or numbness changes in vision or speech,  Inability to tolerate liquids or food, changes in voice cough, altered mental status or any concerns. No signs of systemic illness or infection. The patient is nontoxic-appearing on exam and vital signs are within normal limits.    I have reviewed the triage vital signs and the nursing notes. Pertinent labs &imaging results that were available during my care of the patient were reviewed by me and considered in my medical decision making (see chart for details).  After history, exam, and medical workup I feel the patient has been appropriately medically screened and is safe for discharge home. Pertinent diagnoses were discussed with the patient. Patient was given return precautions.     New Prescriptions New Prescriptions   DICLOFENAC SODIUM CR (VOLTAREN-XR) 100 MG 24 HR TABLET    Take 1 tablet (100 mg total) by mouth daily.     Julianny Milstein, MD 11/20/16 [place], Otillia Cordone, MD 11/21/16 [id number]

## 2016-11-20 NOTE — ED Notes (Signed)
Patient transported to CT SCAN . 

## 2016-11-20 NOTE — ED Notes (Signed)
Delay in lab draw pt enroute to xray. 

## 2016-12-01 ENCOUNTER — Telehealth: Payer: Self-pay | Admitting: *Deleted

## 2016-12-01 NOTE — Telephone Encounter (Signed)
Tried to speak with the patient, too much crackling on his cell phone, asked him to call me back  8:48 AM

## 2016-12-01 NOTE — Telephone Encounter (Signed)
Patient returned call back to me, still wants Bryson Ha to cal him, he feels his same symtpoms are from the radiation , asked if he had called his Dr. Donnetta Hutching, he stated no, waiting for Bryson Ha to call back 9:06 AM

## 2016-12-08 ENCOUNTER — Encounter (HOSPITAL_COMMUNITY): Payer: Self-pay

## 2016-12-08 ENCOUNTER — Ambulatory Visit (HOSPITAL_COMMUNITY)
Admission: RE | Admit: 2016-12-08 | Discharge: 2016-12-08 | Disposition: A | Discharge status: Home / Self Care | Payer: Medicare Other | Source: Ambulatory Visit | Attending: Radiation Oncology | Admitting: Radiation Oncology

## 2016-12-08 DIAGNOSIS — C3492 Malignant neoplasm of unspecified part of left bronchus or lung: Secondary | ICD-10-CM

## 2016-12-08 DIAGNOSIS — I700 Atherosclerosis of aorta: Secondary | ICD-10-CM | POA: Diagnosis not present

## 2016-12-08 DIAGNOSIS — R590 Localized enlarged lymph nodes: Secondary | ICD-10-CM | POA: Diagnosis not present

## 2016-12-08 DIAGNOSIS — J439 Emphysema, unspecified: Secondary | ICD-10-CM | POA: Diagnosis not present

## 2016-12-08 DIAGNOSIS — C3412 Malignant neoplasm of upper lobe, left bronchus or lung: Secondary | ICD-10-CM | POA: Diagnosis not present

## 2016-12-08 MED ORDER — IOPAMIDOL (ISOVUE-300) INJECTION 61%
INTRAVENOUS | Status: AC
  Filled 2016-12-08: qty 75

## 2016-12-08 MED ORDER — IOPAMIDOL (ISOVUE-300) INJECTION 61%
75.0000 mL | Freq: Once | INTRAVENOUS | Status: AC | PRN
  Administered 2016-12-08: 75 mL via INTRAVENOUS

## 2016-12-09 ENCOUNTER — Ambulatory Visit
Admission: RE | Admit: 2016-12-09 | Discharge: 2016-12-09 | Disposition: A | Discharge status: Home / Self Care | Payer: Medicare Other | Source: Ambulatory Visit | Attending: Radiation Oncology | Admitting: Radiation Oncology

## 2016-12-09 ENCOUNTER — Encounter: Payer: Self-pay | Admitting: Radiation Oncology

## 2016-12-09 VITALS — BP 125/72 | HR 80 | Temp 98.4°F | Resp 18 | Ht 70.0 in | Wt 200.0 lb

## 2016-12-09 DIAGNOSIS — Z791 Long term (current) use of non-steroidal anti-inflammatories (NSAID): Secondary | ICD-10-CM | POA: Diagnosis not present

## 2016-12-09 DIAGNOSIS — C3492 Malignant neoplasm of unspecified part of left bronchus or lung: Secondary | ICD-10-CM

## 2016-12-09 DIAGNOSIS — R109 Unspecified abdominal pain: Secondary | ICD-10-CM | POA: Insufficient documentation

## 2016-12-09 DIAGNOSIS — R600 Localized edema: Secondary | ICD-10-CM | POA: Insufficient documentation

## 2016-12-09 DIAGNOSIS — K7030 Alcoholic cirrhosis of liver without ascites: Secondary | ICD-10-CM | POA: Insufficient documentation

## 2016-12-09 DIAGNOSIS — C3412 Malignant neoplasm of upper lobe, left bronchus or lung: Secondary | ICD-10-CM | POA: Insufficient documentation

## 2016-12-09 DIAGNOSIS — Z79899 Other long term (current) drug therapy: Secondary | ICD-10-CM | POA: Insufficient documentation

## 2016-12-09 DIAGNOSIS — Z923 Personal history of irradiation: Secondary | ICD-10-CM | POA: Diagnosis not present

## 2016-12-10 ENCOUNTER — Other Ambulatory Visit: Payer: Self-pay | Admitting: Radiation Oncology

## 2016-12-10 DIAGNOSIS — C3492 Malignant neoplasm of unspecified part of left bronchus or lung: Secondary | ICD-10-CM

## 2016-12-10 NOTE — Progress Notes (Signed)
Radiation Oncology         [phone] ________________________________  Name: Tyler Mcmillan MRN: [id number]  Date of Service: 12/09/2016 DOB: 1945/06/01  Post Treatment Note  CC: Tyler Pretty, MD  Tyler Isaac, MD  Diagnosis:   Stage IB, cT2aN0M0 NSCLC, squamous cell carcinoma of the LUL.  Interval Since Last Radiation:  6 weeks   10/21/2016 to 10/28/2016 SBRT Treatment:   The Left lung was treated to 54 Gy in 3 fractions at 18 Gy per fraction.   Narrative:  Tyler Mcmillan was seen for follow up after receiving SBRT. He was seen prior to treatment due to findings on CT imaging in the lung measuring about 2 cm in the lower lobe of the left lung below the carina. Dr. Lisbeth Mcmillan evaluated this an it appears to be consistent with inflammatory change, most consistent with infection in the interval since his PET scan which did not reveal any findings at this site. He has been having trouble with bilateral lower extremity edema as well as abdominal pain despite normal doppler ultrasound with Dr. Donnetta Mcmillan at his postop check (following endograft AAA repair) on 10/04/16. He continues to follow up with Dr. Donnetta Mcmillan by phone per report about this. I did discuss his symptoms a few weeks ago with IR and they felt like the position of his endograft was appropriate and it typically covers the IMA. His SMA may be intermittently having changes of stenosis contributing to abdominal pain. I communicated this with the patient, and have offered vascular surgery evaluation but he's not been interested in this.   On review of systems, the patient states he is doing ok. He is not complaining of any trouble breathing. He denies any concerns with chest pain or current abdominal pain. He is still having issues with lower extremity edema and he reports he continues to see his PCP for this. No other complaints are noted.  ALLERGIES:  has No Known Allergies.  Meds: Current Outpatient Prescriptions  Medication Sig Dispense  Refill  . amLODipine (NORVASC) 2.5 MG tablet Take 1 tablet (2.5 mg total) by mouth daily. 30 tablet 0  . atenolol (TENORMIN) 50 MG tablet Take 50 mg by mouth daily.     . clonazePAM (KLONOPIN) 0.5 MG tablet Take 0.5 mg by mouth 2 (two) times daily.     [name] ibuprofen (ADVIL,MOTRIN) 200 MG tablet Take 400 mg by mouth daily as needed for mild pain.    [name] triamterene-hydrochlorothiazide (MAXZIDE-25) 37.5-25 MG tablet Take 1 tablet by mouth daily.    . Diclofenac Sodium CR (VOLTAREN-XR) 100 MG 24 hr tablet Take 1 tablet (100 mg total) by mouth daily. (Patient not taking: Reported on 12/09/2016) 10 tablet 0   No current facility-administered medications for this encounter.     Physical Findings:  height is 5\' 10"  (1.778 m) and weight is 200 lb (90.7 kg). His oral temperature is 98.4 F (36.9 C). His blood pressure is 125/72 and his pulse is 80. His respiration is 18 and oxygen saturation is 99%.  Pain Assessment Pain Score: 0-No pain/10 In general this is a chronically ill appearing caucasian male in no acute distress. He's alert and oriented x4 and appropriate throughout the examination. Cardiopulmonary assessment is negative for acute distress and he exhibits normal effort. He chest is auscultated and reveals normal breath sounds throughout the chest. He has 2+ pitting edema of bilateral extremities.   Lab Findings: Lab Results  Component Value Date   WBC 12.0 (H) 11/20/2016  HGB 16.3 11/20/2016   HCT 48.0 11/20/2016   MCV 102.7 (H) 11/20/2016   PLT 203 11/20/2016     Radiographic Findings: Dg Chest 2 View  Result Date: 11/20/2016 CLINICAL DATA:  Preoperative evaluation. EXAM: CHEST  2 VIEW COMPARISON:  Prior radiograph from 09/08/2016. FINDINGS: Mild cardiomegaly, stable. Mediastinal silhouette normal. Aortic atherosclerosis. Lungs normally inflated. Cavitary lesion at the left upper lobe is relatively stable from previous. No new focal infiltrates. No pulmonary edema or pleural effusion.  No pneumothorax. No acute osseus abnormality. Mild chronic wedging within the midthoracic spine noted. IMPRESSION: 1. No significant interval change in cavitary left upper lobe lesion. 2. No other active cardiopulmonary disease. 3. Stable cardiomegaly with aortic atherosclerosis. Electronically Signed   By: Tyler Mcmillan M.D.   On: 11/20/2016 04:38   Ct Head Wo Contrast  Result Date: 11/20/2016 CLINICAL DATA:  Status post fall, with concern for head injury. Initial encounter. EXAM: CT HEAD WITHOUT CONTRAST TECHNIQUE: Contiguous axial images were obtained from the base of the skull through the vertex without intravenous contrast. COMPARISON:  MRI of the brain performed 08/16/2016 FINDINGS: Brain: No evidence of acute infarction, hemorrhage, hydrocephalus, extra-axial collection or mass lesion/mass effect. Prominence of the ventricles and sulci reflects mild to moderate cortical volume loss. Mild periventricular white matter change likely reflects small vessel ischemic microangiopathy. Mild cerebellar atrophy is noted. The brainstem and fourth ventricle are within normal limits. The basal ganglia are unremarkable in appearance. The cerebral hemispheres demonstrate grossly normal gray-white differentiation. No mass effect or midline shift is seen. Vascular: No hyperdense vessel or unexpected calcification. Skull: There is no evidence of fracture; visualized osseous structures are unremarkable in appearance. Sinuses/Orbits: The orbits are within normal limits. The paranasal sinuses and mastoid air cells are well-aerated. Other: No significant soft tissue abnormalities are seen. IMPRESSION: 1. No evidence of traumatic intracranial injury or fracture. 2. Mild to moderate cortical volume loss and scattered small vessel ischemic microangiopathy. Electronically Signed   By: Tyler Mcmillan M.D.   On: 11/20/2016 03:58   Ct Chest W Contrast  Result Date: 12/09/2016 CLINICAL DATA:  Followup left upper lobe  squamous cell carcinoma. Status post stereotactic radiotherapy. EXAM: CT CHEST WITH CONTRAST TECHNIQUE: Multidetector CT imaging of the chest was performed during intravenous contrast administration. CONTRAST:  82mL ISOVUE-300 IOPAMIDOL (ISOVUE-300) INJECTION 61% COMPARISON:  PET-CT on 08/06/2014 FINDINGS: Cardiovascular: No acute findings. Aortic and coronary artery atherosclerosis. Mediastinum/Nodes: Stable 8 mm prevascular mediastinal lymph node on image 35/2, which showed no FDG uptake on prior CT. Pathologically enlarged lymph nodes identified. Lungs/Pleura: Cavitary mass left upper lobe currently measures 3.7 x 3.3 cm compared to 4.0 by 3.0 cm on previous study. Increased adjacent pulmonary opacity is most likely due to radiation pneumonitis. No new or enlarging pulmonary nodules or masses identified. No evidence of pleural effusion. Mild emphysema again noted. Upper Abdomen:  Normal adrenal glands.  Probable hepatic cirrhosis. Musculoskeletal:  No suspicious bone lesions. IMPRESSION: No significant change in overall size of cavitary left upper lobe mass. Stable 8 mm prevascular mediastinal lymph node. No pathologically enlarged lymph nodes or other metastatic disease identified. Aortic Atherosclerosis (ICD10-I70.0) and Emphysema (ICD10-J43.9). Probable hepatic cirrhosis. Electronically Signed   By: Earle Gell M.D.   On: 12/09/2016 09:50   Ct Lumbar Spine Wo Contrast  Result Date: 11/20/2016 CLINICAL DATA:  Initial evaluation for acute right groin pain. EXAM: CT LUMBAR SPINE WITHOUT CONTRAST TECHNIQUE: Multidetector CT imaging of the lumbar spine was performed without intravenous contrast administration. Multiplanar CT  image reconstructions were also generated. COMPARISON:  Prior CT from 10/19/2016. FINDINGS: Segmentation: Normal segmentation. Lowest well-formed disc labeled the L5-S1 level. Alignment: Trace 2 mm anterolisthesis of L4 on L5. Trace retrolisthesis of L5 on S1. Vertebral bodies otherwise  normally aligned with preservation of the normal lumbar lordosis. Vertebrae: Vertebral body heights are maintained. No evidence for acute or chronic fracture. No discrete lytic or blastic osseous lesions. Visualized sacrum intact. SI joints approximated and symmetric. Paraspinal and other soft tissues: Paraspinous soft tissues demonstrate no acute abnormality. Chronic fatty atrophy noted within the lower paraspinous musculature. Intra-abdominal aneurysm with aorto bi-iliac stent endograft in place. Disc levels: L1-2:  Mild bilateral facet hypertrophy.  Otherwise negative. L2-3: Mild annular disc bulge. Minimal facet hypertrophy. No stenosis. L3-4: Mild diffuse disc bulge. Mild facet and ligamentum flavum hypertrophy. No canal or neural foraminal stenosis. L4-5: Trace 2 mm anterolisthesis. Associated diffuse disc bulge. Advanced bilateral facet arthrosis, right greater than left. Resultant mild canal with moderate bilateral subarticular stenosis. Either of the descending L5 nerve roots could be affected. Foramina are elongated but remain relatively patent. L5-S1: Trace retrolisthesis. Diffuse disc bulge with disc desiccation and intervertebral disc space narrowing. Chronic reactive endplate changes, slightly greater on the right. Posterior disc bulge closely approximates the descending S1 nerve roots without neural impingement or displacement. Mild bilateral facet arthrosis. Epidural lipomatosis with a trefoil appearance of the thecal sac. No other canal stenosis. Moderate right L5 foraminal narrowing. IMPRESSION: 1. No acute abnormality within the lumbar spine. 2. 2 mm anterolisthesis of L4 on L5 with associated disc bulge and advanced facet arthropathy, resulting in moderate bilateral lateral recess stenosis. Either of the descending L5 nerve roots could be affected. 3. Shallow posterior disc bulge at L5-S1, closely approximating the descending S1 nerve roots without neural impingement or displacement. 4. Moderate  right L5 foraminal stenosis related to disc bulge, reactive endplate changes, and facet disease. 5. 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 Electronically Signed   By: Tyler Mcmillan M.D.   On: 11/20/2016 04:15   Ct Hip Right Wo Contrast  Result Date: 11/20/2016 CLINICAL DATA:  Acute onset of right groin pain after fall. Initial encounter. EXAM: CT OF THE RIGHT HIP WITHOUT CONTRAST TECHNIQUE: Multidetector CT imaging of the right hip was performed according to the standard protocol. Multiplanar CT image reconstructions were also generated. COMPARISON:  CT of the abdomen and pelvis performed 10/19/2016 FINDINGS: Bones/Joint/Cartilage The suspected fracture on right hip radiograph is not well seen on CT. It may have been artifactual in nature secondary to right femoral head osteophytes, or it may simply be less conspicuous on CT imaging. Subcortical cyst formation is noted at the anterior aspect of the right femoral head and at the superior acetabulum. No hip joint effusion is seen at this time. The cartilage is not well assessed on CT. Ligaments Suboptimally assessed by CT. Muscles and Tendons The visualized musculature is grossly unremarkable. No focal tendon abnormalities are seen. Soft tissues There is mild soft tissue injury overlying the right greater femoral trochanter. Scattered vascular calcifications are seen. A small to moderate right inguinal hernia is noted, containing only fat. The bladder is mildly distended and grossly unremarkable. IMPRESSION: 1. Suspected fracture on right hip radiograph is not well seen on CT. It may have been artifactual in nature secondary to right femoral head osteophytes, or it may simply be less conspicuous on CT imaging. If there is significant persistent clinical concern for fracture, MRI of the right hip  could be considered for further evaluation. 2. Mild subcortical cyst formation at the right hip joint, without significant  joint space narrowing. 3. Mild soft tissue injury overlying the right greater femoral trochanter. 4. Small to moderate right inguinal hernia, containing only fat. 5. Scattered vascular calcifications seen. Electronically Signed   By: Tyler Mcmillan M.D.   On: 11/20/2016 04:12   Dg Hip Unilat  With Pelvis 2-3 Views Right  Result Date: 11/20/2016 CLINICAL DATA:  Status post fall, with right hip pain. Initial encounter. EXAM: DG HIP (WITH OR WITHOUT PELVIS) 2-3V RIGHT COMPARISON:  None. FINDINGS: There is question of a minimally displaced subcapital fracture of the right femoral neck. Both femoral heads are seated normally within their respective acetabula. No significant degenerative change is appreciated. The sacroiliac joints are unremarkable in appearance. The visualized bowel gas pattern is grossly unremarkable in appearance. Scattered phleboliths are noted within the pelvis. IMPRESSION: Question of minimally displaced subcapital fracture of the right femoral neck, seen only on a single image. This could be better assessed on MRI of the right hip, as deemed clinically appropriate. Electronically Signed   By: Tyler Mcmillan M.D.   On: 11/20/2016 02:45    Impression/Plan: 1. Stage IB cT2aN0M0 NSCLC, squamous cell carcinoma of the LUL. The patient appears to be recovering well since radiotherapy. We discussed his scan appears stable since treatment in the lung, and we would recommend routine screening per NCCN guidelines and he will return in 6 months with CT chest to review. 2. Bilateral lower extremity edema. The patient continues to work with his cardiologist and PCP regarding this, and they are making adjustments in his antihypertensives. 3. Radiographic findings of cirrhosis. The patient continues to drink 3-4 glasses of wine a day. We discussed the findings of his liver changes detected on his CT chest. I will make his PCP aware of this as well. He is not ready to make changes to alcohol  intake.     Carola Rhine, PAC

## 2016-12-10 NOTE — Addendum Note (Signed)
Encounter addended by: Kyung Rudd, MD on: 12/10/2016 10:25 AM<BR>    Actions taken: Sign clinical note

## 2016-12-10 NOTE — Progress Notes (Signed)
  Radiation Oncology         [phone] ________________________________  Name: Tyler Mcmillan MRN: [id number]  Date: 10/21/2016  DOB: 10/02/1945  RESPIRATORY MOTION MANAGEMENT SIMULATION  NARRATIVE:  In order to account for effect of respiratory motion on target structures and other organs in the planning and delivery of radiotherapy, this patient underwent respiratory motion management simulation.  To accomplish this, when the patient was brought to the CT simulation planning suite, 4D respiratoy motion management CT images were obtained.  The CT images were loaded into the planning software.  Then, using a variety of tools including Cine, MIP, and standard views, the target volume and planning target volumes (PTV) were delineated.  Avoidance structures were contoured.  Treatment planning then occurred.  Dose volume histograms were generated and reviewed for each of the requested structure.  The resulting plan was carefully reviewed and approved today.  ------------------------------------------------  Jodelle Gross, MD, PhD

## 2016-12-10 NOTE — Progress Notes (Signed)
  Radiation Oncology         [phone] ________________________________  Name: Tyler Mcmillan MRN: [id number]  Date: 10/13/2016  DOB: 09-28-1945  RESPIRATORY MOTION MANAGEMENT SIMULATION  NARRATIVE:  In order to account for effect of respiratory motion on target structures and other organs in the planning and delivery of radiotherapy, this patient underwent respiratory motion management simulation.  To accomplish this, when the patient was brought to the CT simulation planning suite, 4D respiratoy motion management CT images were obtained.  The CT images were loaded into the planning software.  Then, using a variety of tools including Cine, MIP, and standard views, the target volume and planning target volumes (PTV) were delineated.  Avoidance structures were contoured.  Treatment planning then occurred.  Dose volume histograms were generated and reviewed for each of the requested structure.  The resulting plan was carefully reviewed and approved today.  ------------------------------------------------  Tyler Gross, MD, PhD

## 2016-12-10 NOTE — Addendum Note (Signed)
Encounter addended by: Malena Edman, RN on: 12/10/2016  7:43 AM<BR>    Actions taken: Charge Capture section accepted

## 2016-12-10 NOTE — Progress Notes (Signed)
[facility] Simulation and Treatment Planning Note   Name:  Tyler Mcmillan MRN: [id number]   Date: 12/10/2016  DOB: [birth year]  Status:outpatient    DIAGNOSIS:    ICD-10-CM   1. 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  Identity was confirmed.  All relevant records and images related to the planned course of therapy were reviewed.  Then, the patient was positioned in a stable reproducible clinical set-up for radiation therapy. 4D CT images were obtained and reproducible breathing pattern was confirmed. Free breathing CT images were obtained.  Skin markings were placed.  The CT images were loaded into the planning software where the target and avoidance structures were contoured.  The radiation prescription was entered and confirmed.    TREATMENT PLANNING NOTE:  Treatment planning then occurred. I have requested : MLC's, isodose plan, basic dose calculation.  3 dimensional simulation is performed and dose volume histogram of the gross tumor volume, planning tumor volume and criticial normal structures including the spinal cord and lungs were analyzed and requested.  Special treatment procedure was performed due to high dose per fraction.  The patient will be monitored for increased risk of toxicity.  Daily imaging using cone beam CT will be used for target localization.  I anticipate that the patient will receive 54 Gy in 3 fractions to target volume. Further adjustments will be made based on the planning process is necessary.  ------------------------------------------------  Jodelle Gross, MD, PhD

## 2017-04-18 ENCOUNTER — Telehealth: Payer: Self-pay | Admitting: *Deleted

## 2017-04-18 NOTE — Telephone Encounter (Signed)
Tyler Mcmillan called complaining of some pain in his chest.  He describes as a sharp stabbing pain in the left side of his chest, non radiating pain.  He states that the pain started a couple of weeks ago and is causing him some discomfort when laying on his left side.  He denies sternal pain or shortness of breath.  I encouraged him that if his pain should become sternal pain or if it should start to radiate down his arm to come in to the emergency room.  After speaking with PA Dara Lords she recommends that he have some imaging to ensure there are no rib fractures related to his radiation.  I informed Tyler Mcmillan that Bryson Ha would order some imaging and we would be in contact with him regarding date and time.  Will follow as necessary.  Cori Razor, RN

## 2017-04-18 NOTE — Telephone Encounter (Signed)
Pt called lmovm for nurse to call him. Returned call to pt who advised he did not need an appt.  Pt asked if MD will call him. Discussed with pt I would be glad to make him an appt w/MD.  I will give MD pt's request for call back.  Pt did not want to discuss any concerns with RN.

## 2017-04-19 ENCOUNTER — Telehealth: Payer: Self-pay | Admitting: *Deleted

## 2017-04-19 NOTE — Telephone Encounter (Signed)
Left a voicemail letting Mr. Tyler Mcmillan know that we would be ordering his follow-up CT scan early due to complaints of increased pain.  Will follow as necessary.  Cori Razor, RN

## 2017-04-20 ENCOUNTER — Telehealth: Payer: Self-pay | Admitting: *Deleted

## 2017-04-20 NOTE — Telephone Encounter (Signed)
CALLED PATIENT TO INFORM OF STAT LABS FOR 2:15 PM ON 04-21-17 @ [place] AND HIS CT FOR 04-21-17 - ARRIVAL TIME - 3:15 PM, PT. TO BE NPO- 4 HRS. PRIOR TO TEST, SPOKE WITH PATIENT AND HE IS AWARE OF THESE APPTS.

## 2017-04-21 ENCOUNTER — Ambulatory Visit (HOSPITAL_COMMUNITY)
Admission: RE | Admit: 2017-04-21 | Discharge: 2017-04-21 | Disposition: A | Discharge status: Home / Self Care | Payer: Medicare Other | Source: Ambulatory Visit | Attending: Radiation Oncology | Admitting: Radiation Oncology

## 2017-04-21 ENCOUNTER — Ambulatory Visit
Admission: RE | Admit: 2017-04-21 | Discharge: 2017-04-21 | Disposition: A | Discharge status: Home / Self Care | Payer: Medicare Other | Source: Ambulatory Visit | Attending: Radiation Oncology | Admitting: Radiation Oncology

## 2017-04-21 DIAGNOSIS — C3492 Malignant neoplasm of unspecified part of left bronchus or lung: Secondary | ICD-10-CM

## 2017-04-21 DIAGNOSIS — R918 Other nonspecific abnormal finding of lung field: Secondary | ICD-10-CM | POA: Diagnosis not present

## 2017-04-21 DIAGNOSIS — I700 Atherosclerosis of aorta: Secondary | ICD-10-CM | POA: Insufficient documentation

## 2017-04-21 DIAGNOSIS — J9811 Atelectasis: Secondary | ICD-10-CM | POA: Diagnosis not present

## 2017-04-21 LAB — BUN & CREATININE (CHCC)
BUN: 12 mg/dL (ref 7–26)
Creatinine: 0.86 mg/dL (ref 0.70–1.30)
GFR, Est AFR Am: >60 mL/min (ref >60)

## 2017-04-21 MED ORDER — IOPAMIDOL (ISOVUE-300) INJECTION 61%
INTRAVENOUS | Status: AC
  Filled 2017-04-21: qty 75

## 2017-04-21 MED ORDER — IOPAMIDOL (ISOVUE-300) INJECTION 61%
75.0000 mL | Freq: Once | INTRAVENOUS | Status: AC | PRN
  Administered 2017-04-21: 75 mL via INTRAVENOUS

## 2017-04-25 ENCOUNTER — Telehealth: Payer: Self-pay | Admitting: Radiation Oncology

## 2017-04-25 DIAGNOSIS — C3412 Malignant neoplasm of upper lobe, left bronchus or lung: Secondary | ICD-10-CM

## 2017-04-25 MED ORDER — GABAPENTIN 300 MG PO CAPS: 300.0000 mg | ORAL_CAPSULE | Freq: Three times a day (TID) | ORAL | 0 refills | Status: DC

## 2017-04-25 NOTE — Telephone Encounter (Signed)
I called the patient to review his scans and no evidence of rib fracture but that perhaps some tethering of his treated lesion could be causing his symptoms. If a trial of gabapentin doesn't help, could consider reeval with pulmonary and or pain management.

## 2017-04-26 ENCOUNTER — Telehealth: Payer: Self-pay | Admitting: *Deleted

## 2017-04-26 ENCOUNTER — Telehealth: Payer: Self-pay | Admitting: Radiation Oncology

## 2017-04-26 NOTE — Telephone Encounter (Signed)
Called patient to inform of Ct for 10-12-17 @ [facility] and fu with Shona Simpson on 10-13-17 to get results, spoke with patient and he is aware of these appts.

## 2017-04-26 NOTE — Telephone Encounter (Signed)
The patient called back to discuss his symptoms today. He reports gabapentin didn't help his symptoms after taking two tablets. In summary he has received SBRT to the left lung in September 2018 and has had left chest wall pain and increasing shortness of breath within the last 3 weeks. His chest wall pain is described as stabbing that takes his breath away when lying on his side at night, or when getting up in the morning. He reports he has taken ibuprofen and now Gabapentin without relief. He denies any skin lesions. He has not had recent shingles. He did undergo CT chest on 04/21/17, and has post radiotherapy changes in the left upper lobe, and improvement in the size of the treated lesion. Although there is no mention of musculoskeletal changes like fracture, in my review of the films it appears that there is some scarring extending to the pleura and perhaps this is the source of his symptoms versus early fracture that cannot be seen of the rib. I encouraged him to consider continuing the Gabapentin, and to also see Dr. Vaughan Browner, the pulmonologist who diagnosed him to rule out primary lung concerns like progressive COPD or fibrosis. If there is no suspicion of underlying lung disease (as he reassures me he also had a normal stress test last year), that consideration of a pain clinic referral to discuss nerve ablation may be helpful. The patient was told twice during our discussion that I didn't think he had an indication for narcotic medication. 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  He abruptly ended the call at reviewing this. I will still place orders for his next CT which is due in 6 months for cancer surveillance per NCCN guidelines.

## 2017-04-27 ENCOUNTER — Telehealth: Payer: Self-pay | Admitting: Pulmonary Disease

## 2017-04-27 NOTE — Telephone Encounter (Signed)
Spoke with pt. States that he is coughing up blood. Reports that when he starts coughing, his nose will bleed then he coughs up the blood. I advised him that this is a matter that can't be handled over the phone, he would need an appointment. I offered him an appointment with Dr. Lamonte Sakai this afternoon, he refused. Pt has been scheduled with Tammy Parrett on 05/02/2017. Nothing further was needed.

## 2017-04-28 ENCOUNTER — Ambulatory Visit (HOSPITAL_COMMUNITY): Payer: Medicare Other

## 2017-05-02 ENCOUNTER — Ambulatory Visit: Payer: Medicare Other | Admitting: Adult Health

## 2017-05-02 NOTE — Telephone Encounter (Signed)
Thanks for the update. I have not seen him since his diagnosis of lung cancer last year.  He is scheduled to see Rexene Edison, NP in our office today. I will forward the message to her.   Marshell Garfinkel MD [facility] Pager [phone] 05/02/2017, 9:21 AM

## 2017-05-05 ENCOUNTER — Telehealth: Payer: Self-pay | Admitting: Radiation Oncology

## 2017-05-05 NOTE — Telephone Encounter (Signed)
I called the patient to discuss his symptoms. He's still having issues with his chest wall pain. I discussed the options of meeting with Dr. Maryjean Ka and the patient is going to seek a second opinion with Dr. Servando Snare and Dr. Vaughan Browner.

## 2017-05-12 ENCOUNTER — Telehealth: Payer: Self-pay | Admitting: *Deleted

## 2017-05-12 NOTE — Telephone Encounter (Signed)
Called pt & informed of Dr Tyler Mcmillan report.  Pt sees Dr Shelia Media on Monday & informed to have Dr Shelia Media touch base with Dr [name] if he thinks there is some reason to be seen before 6 months.  Pt expressed understanding.

## 2017-05-12 NOTE — Telephone Encounter (Signed)
Received call from pt asking for an appt with Dr [name].  He states that his PCP, Dr Shelia Media recommended that he go back to Dr [name].  He c/o pain in his chest, legs swelling, unable to sleep b/c of shooting pains where his tumor was & he has fallen some from leg issues.  Informed that message would be sent to Dr Mohamed/Pod RN

## 2017-05-12 NOTE — Telephone Encounter (Signed)
Last scan on 04/22/17 showed no evidence of cancer progression but actually some improvement. Follow up in 6 months with scan.

## 2017-05-16 DIAGNOSIS — E871 Hypo-osmolality and hyponatremia: Secondary | ICD-10-CM | POA: Diagnosis not present

## 2017-05-16 DIAGNOSIS — R609 Edema, unspecified: Secondary | ICD-10-CM | POA: Diagnosis not present

## 2017-05-16 DIAGNOSIS — R0781 Pleurodynia: Secondary | ICD-10-CM | POA: Diagnosis not present

## 2017-05-16 DIAGNOSIS — S2232XA Fracture of one rib, left side, initial encounter for closed fracture: Secondary | ICD-10-CM | POA: Diagnosis not present

## 2017-05-19 DIAGNOSIS — E871 Hypo-osmolality and hyponatremia: Secondary | ICD-10-CM | POA: Diagnosis not present

## 2017-05-19 DIAGNOSIS — R609 Edema, unspecified: Secondary | ICD-10-CM | POA: Diagnosis not present

## 2017-05-19 DIAGNOSIS — R0781 Pleurodynia: Secondary | ICD-10-CM | POA: Diagnosis not present

## 2017-06-02 DIAGNOSIS — R0781 Pleurodynia: Secondary | ICD-10-CM | POA: Diagnosis not present

## 2017-06-02 DIAGNOSIS — R600 Localized edema: Secondary | ICD-10-CM | POA: Diagnosis not present

## 2017-06-06 ENCOUNTER — Telehealth: Payer: Self-pay | Admitting: Pulmonary Disease

## 2017-06-06 NOTE — Telephone Encounter (Signed)
Spoke with pt, he called this office in error- trying to reach Dr. Arvilla Market office.  Provided pt with correct callback #.  Will close encounter.

## 2017-06-08 ENCOUNTER — Telehealth: Payer: Self-pay | Admitting: Medical Oncology

## 2017-06-08 NOTE — Telephone Encounter (Addendum)
I returned call to pt and left message to call me back re his ? "Broken ribs" and I received a call from Dr Pennie Banter office to call pt.  I called pt again and spoke to him .He is concerned with " the swelling in my legs,  I 'm dealing with so many problems , broken ribs. I think I need another CT scan".    For his swelling , he said Dr Shelia Media increased his lasix but pt does not want to do that. I told him he needs to reconsider taking the lasix. I notified Dr Linnell Fulling office.

## 2017-06-10 ENCOUNTER — Ambulatory Visit (HOSPITAL_COMMUNITY): Payer: Medicare Other

## 2017-06-13 ENCOUNTER — Ambulatory Visit: Payer: Self-pay | Admitting: Radiation Oncology

## 2017-06-14 ENCOUNTER — Other Ambulatory Visit: Payer: Self-pay | Admitting: Internal Medicine

## 2017-06-14 DIAGNOSIS — M79606 Pain in leg, unspecified: Secondary | ICD-10-CM

## 2017-06-14 DIAGNOSIS — I890 Lymphedema, not elsewhere classified: Secondary | ICD-10-CM

## 2017-06-14 DIAGNOSIS — M7989 Other specified soft tissue disorders: Principal | ICD-10-CM

## 2017-06-15 ENCOUNTER — Ambulatory Visit: Payer: Medicare Other | Admitting: Physical Therapy

## 2017-09-01 ENCOUNTER — Telehealth: Payer: Self-pay | Admitting: Internal Medicine

## 2017-09-01 NOTE — Telephone Encounter (Signed)
Faxed medical records to [facility] at [phone], Release ID: [id number]

## 2017-10-11 ENCOUNTER — Telehealth: Payer: Self-pay | Admitting: *Deleted

## 2017-10-11 NOTE — Telephone Encounter (Signed)
Called patient to inform of lab on 11-02-17- @ 1:15 pm and his CT on 11-02-17 - arrival time - 2:15 pm - water only starting @ 10:30 am @ [facility], pt. to get results on 11-07-17 with Shona Simpson, spoke with patient and he is aware of these tests.

## 2017-10-12 ENCOUNTER — Ambulatory Visit (HOSPITAL_COMMUNITY): Admission: RE | Admit: 2017-10-12 | Payer: Medicare Other | Source: Ambulatory Visit

## 2017-10-12 ENCOUNTER — Ambulatory Visit: Payer: Medicare Other

## 2017-10-13 ENCOUNTER — Ambulatory Visit: Payer: Medicare Other | Admitting: Radiation Oncology

## 2017-11-02 ENCOUNTER — Ambulatory Visit: Payer: Medicare Other

## 2017-11-02 ENCOUNTER — Ambulatory Visit (HOSPITAL_COMMUNITY): Payer: Medicare Other

## 2017-11-07 ENCOUNTER — Ambulatory Visit: Payer: Medicare Other | Admitting: Radiation Oncology

## 2018-03-15 ENCOUNTER — Other Ambulatory Visit: Payer: Self-pay

## 2018-03-15 DIAGNOSIS — I714 Abdominal aortic aneurysm, without rupture, unspecified: Secondary | ICD-10-CM

## 2018-03-30 ENCOUNTER — Inpatient Hospital Stay (HOSPITAL_COMMUNITY)
Admission: EM | Admit: 2018-03-30 | Discharge: 2018-04-10 | DRG: 377 | Disposition: A | Discharge status: Skilled Nursing Facility | Payer: Medicare Other | Attending: Internal Medicine | Admitting: Internal Medicine

## 2018-03-30 ENCOUNTER — Other Ambulatory Visit: Payer: Self-pay

## 2018-03-30 ENCOUNTER — Encounter (HOSPITAL_COMMUNITY): Payer: Self-pay | Admitting: Emergency Medicine

## 2018-03-30 ENCOUNTER — Emergency Department (HOSPITAL_COMMUNITY): Payer: Medicare Other

## 2018-03-30 ENCOUNTER — Observation Stay (HOSPITAL_COMMUNITY): Payer: Medicare Other

## 2018-03-30 DIAGNOSIS — D62 Acute posthemorrhagic anemia: Secondary | ICD-10-CM

## 2018-03-30 DIAGNOSIS — K264 Chronic or unspecified duodenal ulcer with hemorrhage: Principal | ICD-10-CM | POA: Diagnosis present

## 2018-03-30 DIAGNOSIS — Z791 Long term (current) use of non-steroidal anti-inflammatories (NSAID): Secondary | ICD-10-CM | POA: Diagnosis not present

## 2018-03-30 DIAGNOSIS — Z419 Encounter for procedure for purposes other than remedying health state, unspecified: Secondary | ICD-10-CM

## 2018-03-30 DIAGNOSIS — R531 Weakness: Secondary | ICD-10-CM

## 2018-03-30 DIAGNOSIS — J9383 Other pneumothorax: Secondary | ICD-10-CM | POA: Diagnosis not present

## 2018-03-30 DIAGNOSIS — E440 Moderate protein-calorie malnutrition: Secondary | ICD-10-CM | POA: Diagnosis not present

## 2018-03-30 DIAGNOSIS — Z8249 Family history of ischemic heart disease and other diseases of the circulatory system: Secondary | ICD-10-CM

## 2018-03-30 DIAGNOSIS — D649 Anemia, unspecified: Secondary | ICD-10-CM | POA: Diagnosis not present

## 2018-03-30 DIAGNOSIS — W19XXXA Unspecified fall, initial encounter: Secondary | ICD-10-CM

## 2018-03-30 DIAGNOSIS — J9811 Atelectasis: Secondary | ICD-10-CM | POA: Diagnosis not present

## 2018-03-30 DIAGNOSIS — L89151 Pressure ulcer of sacral region, stage 1: Secondary | ICD-10-CM | POA: Diagnosis present

## 2018-03-30 DIAGNOSIS — I10 Essential (primary) hypertension: Secondary | ICD-10-CM | POA: Diagnosis present

## 2018-03-30 DIAGNOSIS — R609 Edema, unspecified: Secondary | ICD-10-CM

## 2018-03-30 DIAGNOSIS — I714 Abdominal aortic aneurysm, without rupture: Secondary | ICD-10-CM | POA: Diagnosis not present

## 2018-03-30 DIAGNOSIS — R0600 Dyspnea, unspecified: Secondary | ICD-10-CM | POA: Diagnosis not present

## 2018-03-30 DIAGNOSIS — Y92009 Unspecified place in unspecified non-institutional (private) residence as the place of occurrence of the external cause: Secondary | ICD-10-CM | POA: Diagnosis not present

## 2018-03-30 DIAGNOSIS — Z720 Tobacco use: Secondary | ICD-10-CM | POA: Diagnosis not present

## 2018-03-30 DIAGNOSIS — R5383 Other fatigue: Secondary | ICD-10-CM | POA: Diagnosis present

## 2018-03-30 DIAGNOSIS — J90 Pleural effusion, not elsewhere classified: Secondary | ICD-10-CM | POA: Diagnosis not present

## 2018-03-30 DIAGNOSIS — K7460 Unspecified cirrhosis of liver: Secondary | ICD-10-CM | POA: Diagnosis present

## 2018-03-30 DIAGNOSIS — Z85118 Personal history of other malignant neoplasm of bronchus and lung: Secondary | ICD-10-CM | POA: Diagnosis not present

## 2018-03-30 DIAGNOSIS — Z86010 Personal history of colonic polyps: Secondary | ICD-10-CM

## 2018-03-30 DIAGNOSIS — T82330D Leakage of aortic (bifurcation) graft (replacement), subsequent encounter: Secondary | ICD-10-CM | POA: Diagnosis not present

## 2018-03-30 DIAGNOSIS — C3490 Malignant neoplasm of unspecified part of unspecified bronchus or lung: Secondary | ICD-10-CM | POA: Diagnosis not present

## 2018-03-30 DIAGNOSIS — Z79899 Other long term (current) drug therapy: Secondary | ICD-10-CM

## 2018-03-30 DIAGNOSIS — I119 Hypertensive heart disease without heart failure: Secondary | ICD-10-CM | POA: Diagnosis not present

## 2018-03-30 DIAGNOSIS — Y832 Surgical operation with anastomosis, bypass or graft as the cause of abnormal reaction of the patient, or of later complication, without mention of misadventure at the time of the procedure: Secondary | ICD-10-CM | POA: Diagnosis present

## 2018-03-30 DIAGNOSIS — R600 Localized edema: Secondary | ICD-10-CM

## 2018-03-30 DIAGNOSIS — Z923 Personal history of irradiation: Secondary | ICD-10-CM | POA: Diagnosis not present

## 2018-03-30 DIAGNOSIS — G8929 Other chronic pain: Secondary | ICD-10-CM

## 2018-03-30 DIAGNOSIS — K922 Gastrointestinal hemorrhage, unspecified: Secondary | ICD-10-CM | POA: Diagnosis not present

## 2018-03-30 DIAGNOSIS — Z8679 Personal history of other diseases of the circulatory system: Secondary | ICD-10-CM

## 2018-03-30 DIAGNOSIS — K269 Duodenal ulcer, unspecified as acute or chronic, without hemorrhage or perforation: Secondary | ICD-10-CM | POA: Diagnosis not present

## 2018-03-30 DIAGNOSIS — J948 Other specified pleural conditions: Secondary | ICD-10-CM | POA: Diagnosis not present

## 2018-03-30 DIAGNOSIS — J9621 Acute and chronic respiratory failure with hypoxia: Secondary | ICD-10-CM

## 2018-03-30 DIAGNOSIS — R296 Repeated falls: Secondary | ICD-10-CM | POA: Diagnosis not present

## 2018-03-30 DIAGNOSIS — F17210 Nicotine dependence, cigarettes, uncomplicated: Secondary | ICD-10-CM | POA: Diagnosis present

## 2018-03-30 DIAGNOSIS — K8020 Calculus of gallbladder without cholecystitis without obstruction: Secondary | ICD-10-CM | POA: Diagnosis not present

## 2018-03-30 DIAGNOSIS — L8990 Pressure ulcer of unspecified site, unspecified stage: Secondary | ICD-10-CM

## 2018-03-30 DIAGNOSIS — Z9181 History of falling: Secondary | ICD-10-CM | POA: Diagnosis not present

## 2018-03-30 DIAGNOSIS — C7802 Secondary malignant neoplasm of left lung: Secondary | ICD-10-CM | POA: Diagnosis not present

## 2018-03-30 DIAGNOSIS — Z8551 Personal history of malignant neoplasm of bladder: Secondary | ICD-10-CM

## 2018-03-30 DIAGNOSIS — Z9689 Presence of other specified functional implants: Secondary | ICD-10-CM

## 2018-03-30 DIAGNOSIS — F419 Anxiety disorder, unspecified: Secondary | ICD-10-CM | POA: Diagnosis not present

## 2018-03-30 DIAGNOSIS — R918 Other nonspecific abnormal finding of lung field: Secondary | ICD-10-CM | POA: Diagnosis not present

## 2018-03-30 DIAGNOSIS — K3189 Other diseases of stomach and duodenum: Secondary | ICD-10-CM | POA: Diagnosis not present

## 2018-03-30 DIAGNOSIS — R279 Unspecified lack of coordination: Secondary | ICD-10-CM | POA: Diagnosis not present

## 2018-03-30 DIAGNOSIS — K921 Melena: Secondary | ICD-10-CM | POA: Diagnosis not present

## 2018-03-30 DIAGNOSIS — Z743 Need for continuous supervision: Secondary | ICD-10-CM | POA: Diagnosis not present

## 2018-03-30 DIAGNOSIS — Z95828 Presence of other vascular implants and grafts: Secondary | ICD-10-CM | POA: Diagnosis not present

## 2018-03-30 DIAGNOSIS — J918 Pleural effusion in other conditions classified elsewhere: Secondary | ICD-10-CM | POA: Diagnosis present

## 2018-03-30 DIAGNOSIS — C3492 Malignant neoplasm of unspecified part of left bronchus or lung: Secondary | ICD-10-CM | POA: Diagnosis not present

## 2018-03-30 LAB — PREPARE RBC (CROSSMATCH)

## 2018-03-30 LAB — COMPREHENSIVE METABOLIC PANEL
ALT: 36 U/L (ref 0–44)
AST: 60 U/L — ABNORMAL HIGH (ref 15–41)
Albumin: 2.6 g/dL — ABNORMAL LOW (ref 3.5–5.0)
Alkaline Phosphatase: 115 U/L (ref 38–126)
Anion gap: 11 (ref 5–15)
BUN: 15 mg/dL (ref 8–23)
CO2: 22 mmol/L (ref 22–32)
Calcium: 8.7 mg/dL — ABNORMAL LOW (ref 8.9–10.3)
Chloride: 104 mmol/L (ref 98–111)
Creatinine, Ser: 1.02 mg/dL (ref 0.61–1.24)
GFR calc non Af Amer: >60 mL/min (ref >60)
Glucose, Bld: 130 mg/dL — ABNORMAL HIGH (ref 70–99)
Potassium: 4.7 mmol/L (ref 3.5–5.1)
Sodium: 137 mmol/L (ref 135–145)
Total Bilirubin: 0.2 mg/dL — ABNORMAL LOW (ref 0.3–1.2)
Total Protein: 6.4 g/dL — ABNORMAL LOW (ref 6.5–8.1)

## 2018-03-30 LAB — CBC
HCT: 29.9 % — ABNORMAL LOW (ref 39.0–52.0)
Hemoglobin: 9.2 g/dL — ABNORMAL LOW (ref 13.0–17.0)
MCH: 34.5 pg — ABNORMAL HIGH (ref 26.0–34.0)
MCHC: 30.8 g/dL (ref 30.0–36.0)
MCV: 112 fL — ABNORMAL HIGH (ref 80.0–100.0)
Platelets: 302 10*3/uL (ref 150–400)
RBC: 2.67 MIL/uL — ABNORMAL LOW (ref 4.22–5.81)
RDW: 14.1 % (ref 11.5–15.5)
WBC: 8.4 10*3/uL (ref 4.0–10.5)
nRBC: 0 % (ref 0.0–0.2)

## 2018-03-30 LAB — POC OCCULT BLOOD, ED: Fecal Occult Bld: POSITIVE — AB

## 2018-03-30 LAB — TROPONIN I: Troponin I: <0.03 ng/mL (ref <0.03)

## 2018-03-30 MED ORDER — ACETAMINOPHEN 650 MG RE SUPP: 650.0000 mg | Freq: Four times a day (QID) | RECTAL | Status: DC | PRN

## 2018-03-30 MED ORDER — ONDANSETRON HCL 4 MG/2ML IJ SOLN
4.0000 mg | Freq: Once | INTRAMUSCULAR | Status: AC
  Administered 2018-03-30: 4 mg via INTRAVENOUS
  Filled 2018-03-30: qty 2

## 2018-03-30 MED ORDER — IOPAMIDOL (ISOVUE-370) INJECTION 76%
INTRAVENOUS | Status: AC
  Filled 2018-03-30: qty 100

## 2018-03-30 MED ORDER — SODIUM CHLORIDE 0.9 % IV SOLN
8.0000 mg/h | INTRAVENOUS | Status: DC
  Administered 2018-03-30: 8 mg/h via INTRAVENOUS
  Filled 2018-03-30 (×3): qty 80

## 2018-03-30 MED ORDER — DIPHENHYDRAMINE HCL 50 MG/ML IJ SOLN
12.5000 mg | Freq: Once | INTRAMUSCULAR | Status: AC
  Administered 2018-03-30: 12.5 mg via INTRAVENOUS
  Filled 2018-03-30: qty 1

## 2018-03-30 MED ORDER — ONDANSETRON HCL 4 MG PO TABS: 4.0000 mg | ORAL_TABLET | Freq: Four times a day (QID) | ORAL | Status: DC | PRN

## 2018-03-30 MED ORDER — SODIUM CHLORIDE 0.9 % IV BOLUS
1000.0000 mL | Freq: Once | INTRAVENOUS | Status: AC
  Administered 2018-03-30: 1000 mL via INTRAVENOUS

## 2018-03-30 MED ORDER — SODIUM CHLORIDE 0.9 % IV SOLN
INTRAVENOUS | Status: DC
  Administered 2018-03-30: 75 mL/h via INTRAVENOUS
  Administered 2018-03-31: 22:00:00 via INTRAVENOUS

## 2018-03-30 MED ORDER — ACETAMINOPHEN 325 MG PO TABS: 650.0000 mg | ORAL_TABLET | Freq: Four times a day (QID) | ORAL | Status: DC | PRN

## 2018-03-30 MED ORDER — SODIUM CHLORIDE 0.9 % IV SOLN
80.0000 mg | Freq: Once | INTRAVENOUS | Status: AC
  Administered 2018-03-30: 80 mg via INTRAVENOUS
  Filled 2018-03-30: qty 80

## 2018-03-30 MED ORDER — LORAZEPAM 2 MG/ML IJ SOLN
0.5000 mg | Freq: Four times a day (QID) | INTRAMUSCULAR | Status: DC | PRN
  Administered 2018-03-30 – 2018-04-10 (×22): 0.5 mg via INTRAVENOUS
  Filled 2018-03-30 (×23): qty 1

## 2018-03-30 MED ORDER — MORPHINE SULFATE (PF) 4 MG/ML IV SOLN
4.0000 mg | Freq: Once | INTRAVENOUS | Status: AC
  Administered 2018-03-30: 4 mg via INTRAVENOUS
  Filled 2018-03-30: qty 1

## 2018-03-30 MED ORDER — IOPAMIDOL (ISOVUE-370) INJECTION 76%
100.0000 mL | Freq: Once | INTRAVENOUS | Status: AC | PRN
  Administered 2018-03-30: 100 mL via INTRAVENOUS

## 2018-03-30 MED ORDER — ONDANSETRON HCL 4 MG/2ML IJ SOLN: 4.0000 mg | Freq: Four times a day (QID) | INTRAMUSCULAR | Status: DC | PRN

## 2018-03-30 MED ORDER — SODIUM CHLORIDE 0.9% IV SOLUTION: Freq: Once | INTRAVENOUS | Status: DC

## 2018-03-30 MED ORDER — NICOTINE 14 MG/24HR TD PT24
14.0000 mg | MEDICATED_PATCH | Freq: Every day | TRANSDERMAL | Status: DC
  Administered 2018-03-31 – 2018-04-10 (×12): 14 mg via TRANSDERMAL
  Filled 2018-03-30 (×12): qty 1

## 2018-03-30 MED ORDER — MORPHINE SULFATE (PF) 2 MG/ML IV SOLN
2.0000 mg | INTRAVENOUS | Status: DC | PRN
  Administered 2018-03-30 – 2018-04-07 (×18): 2 mg via INTRAVENOUS
  Filled 2018-03-30 (×19): qty 1

## 2018-03-30 NOTE — ED Notes (Signed)
Report attempted 

## 2018-03-30 NOTE — ED Notes (Signed)
Pt back from X-ray.  

## 2018-03-30 NOTE — H&P (Signed)
[facility] History and Physical  Tyler Mcmillan CBJ:[id number] DOB: 1945/07/24 DOA: 03/30/2018  PCP: [name], MD  Patient coming from: Home  Chief Complaint: Fatigue, dark stools  HPI: Tyler Mcmillan is a 73 y.o. male with a medical history of lung cancer, AAA, anxiety, who presented to the emergency department with complaints of fatigue, weakness, dizziness, dark stools, for several months however worsened in the past week.  Patient has not been on any blood thinners but endorses using Advil for pain.  Patient has seen gastroenterology in the past approximately 6 years ago and had a colonoscopy and endoscopy at that time.  Patient does state that his colonoscopy was abnormal however he never followed up.  Patient was also noted to have a AAA with stent placement by Dr. Donnetta Hutching back in 2018, and was supposed to have a CTA recently however was unable to make it to that appointment.  Endorses falling frequently and unable to move well.  His son is at bedside and also states the same.  Complains of having lower extremity swelling which is been ongoing since 2018 since his stent placement.  He denies current chest pain, shortness of breath, abdominal pain, nausea or vomiting, diarrhea or constipation, painful urination, headache, recent illness or travel.   ED Course: Found to have dark stools, FOBT positive.  Hemoglobin 9.2.  Chest x-ray also showing malignancy.  TRH called for admission, GI consulted.  Review of Systems:  All other systems reviewed and are negative.   Past Medical History:  Diagnosis Date  . AAA (abdominal aortic aneurysm) without rupture ([place])    [place] ABD.  DONE  JUNE 2014  3.5  . Bladder cancer ([place])   . Bladder neoplasm   . Chronic anxiety   . GERD (gastroesophageal reflux disease)   . Hypertension   . 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 ABDOMINAL AORTIC ENDOVASCULAR STENT GRAFT N/A  09/08/2016   Procedure: ABDOMINAL AORTIC ENDOVASCULAR STENT GRAFT;  Surgeon: [name], MD;  Location: [place];  Service: Vascular;  Laterality: N/A;  . CYSTOSCOPY W/ URETERAL STENT PLACEMENT Left 10/04/2012   Procedure: CYSTOSCOPY WITH RETROGRADE PYELOGRAM/URETERAL STENT PLACEMENT   "POSSIBLE LEFT STENT";  Surgeon: Alexis Frock, MD;  Location[facility];  Service: Urology;  Laterality: Left;  . LEFT HEART CATH AND CORONARY ANGIOGRAPHY N/A 07/02/2016   Procedure: Left Heart Cath and Coronary Angiography;  Surgeon: Martinique, Peter M, MD;  Location: [place] [facility];  Service: Cardiovascular;  Laterality: N/A;  . MEATOTOMY N/A 10/04/2012   Procedure: MEATOTOMY ADULT;  Surgeon: Alexis Frock, MD;  Location[facility];  Service: Urology;  Laterality: N/A;  . TRANSURETHRAL RESECTION OF BLADDER TUMOR WITH GYRUS (TURBT-GYRUS) N/A 10/04/2012   Procedure: TRANSURETHRAL RESECTION OF BLADDER TUMOR WITH GYRUS (TURBT-GYRUS);  Surgeon: Alexis Frock, MD;  Location: [facility];  Service: Urology;  Laterality: N/A;  . VIDEO BRONCHOSCOPY Bilateral 07/19/2016   Procedure: VIDEO BRONCHOSCOPY WITH FLUORO;  Surgeon: Marshell Garfinkel, MD;  Location: [facility];  Service: Cardiopulmonary;  Laterality: Bilateral;    Social History:  reports that he has been smoking cigarettes. He has a 25.00 pack-year smoking history. He has never used smokeless tobacco. He reports current alcohol use of about 24.0 standard drinks of alcohol per week. He reports that he does not use drugs.  No Known Allergies  Family History  Problem Relation Age of Onset  .  Cancer Mother   . Heart disease Father        before age 70  . Heart attack Father   . Cancer Brother      Prior to Admission medications   Medication Sig Start Date End Date Taking? Authorizing Provider  clonazePAM (KLONOPIN) 0.5 MG tablet Take 0.5 mg by mouth 2 (two) times daily.    Yes [provider]  ibuprofen  (ADVIL,MOTRIN) 200 MG tablet Take 400 mg by mouth daily as needed (for pain).    Yes [provider]    Physical Exam: Vitals:   03/30/18 1645 03/30/18 1700  BP: 130/65 130/72  Pulse: 95 96  Resp: 14 18  Temp:    SpO2: 97% 97%     General: Well developed, chronically ill appearing, NAD  HEENT: NCAT, PERRLA, EOMI, Anicteic Sclera, mucous membranes moist.   Neck: Supple, no JVD, no masses  Cardiovascular: S1 S2 auscultated, no rubs, murmurs or gallops. Regular rate and rhythm.  Respiratory: Diminished breath sounds left lung, Right clear  Abdomen: Soft, nontender, nondistended, + bowel sounds  Extremities: warm dry without cyanosis clubbing. +++LE Edema B/L  Neuro: AAOx3, cranial nerves grossly intact. Strength 5/5 in patient's upper and lower extremities bilaterally  Skin: Without rashes exudates or nodules, pale  Psych: Normal affect and demeanor with intact judgement and insight  Labs on Admission: I have personally reviewed following labs and imaging studies CBC: Recent Labs  Lab 03/30/18 1303  WBC 8.4  HGB 9.2*  HCT 29.9*  MCV 112.0*  PLT 629   Basic Metabolic Panel: Recent Labs  Lab 03/30/18 1303  NA 137  K 4.7  CL 104  CO2 22  GLUCOSE 130*  BUN 15  CREATININE 1.02  CALCIUM 8.7*   GFR: Estimated Creatinine Clearance: 67.6 mL/min (by C-G formula based on SCr of 1.02 mg/dL). Liver Function Tests: Recent Labs  Lab 03/30/18 1303  AST 60*  ALT 36  ALKPHOS 115  BILITOT 0.2*  PROT 6.4*  ALBUMIN 2.6*   No results for input(s): LIPASE, AMYLASE in the last 168 hours. No results for input(s): AMMONIA in the last 168 hours. Coagulation Profile: No results for input(s): INR, PROTIME in the last 168 hours. Cardiac Enzymes: Recent Labs  Lab 03/30/18 1531  TROPONINI <0.03   BNP (last 3 results) No results for input(s): PROBNP in the last 8760 hours. HbA1C: No results for input(s): HGBA1C in the last 72 hours. CBG: No results for  input(s): GLUCAP in the last 168 hours. Lipid Profile: No results for input(s): CHOL, HDL, LDLCALC, TRIG, CHOLHDL, LDLDIRECT in the last 72 hours. Thyroid Function Tests: No results for input(s): TSH, T4TOTAL, FREET4, T3FREE, THYROIDAB in the last 72 hours. Anemia Panel: No results for input(s): VITAMINB12, FOLATE, FERRITIN, TIBC, IRON, RETICCTPCT in the last 72 hours. Urine analysis:    Component Value Date/Time   COLORURINE YELLOW 11/20/2016 0330   APPEARANCEUR CLEAR 11/20/2016 0330   LABSPEC 1.006 11/20/2016 0330   PHURINE 5.0 11/20/2016 0330   GLUCOSEU NEGATIVE 11/20/2016 0330   HGBUR SMALL (A) 11/20/2016 0330   BILIRUBINUR NEGATIVE 11/20/2016 0330   KETONESUR NEGATIVE 11/20/2016 0330   PROTEINUR NEGATIVE 11/20/2016 0330   NITRITE NEGATIVE 11/20/2016 0330   LEUKOCYTESUR NEGATIVE 11/20/2016 0330   Sepsis Labs: @LABRCNTIP (procalcitonin:4,lacticidven:4) )No results found for this or any previous visit (from the past 240 hour(s)).   Radiological Exams on Admission: Dg Chest 2 View  Result Date: 03/30/2018 CLINICAL DATA:  Fatigue and weakness. Reported history of lung  carcinoma EXAM: CHEST - 2 VIEW COMPARISON:  May 16, 2017 FINDINGS: There is a sizable left pleural effusion. There is a nodular opacity in the anterior segment of the left upper lobe measuring 3.2 x 3.1 x 3.4 cm. There are suspected nodular lesions partially obscured by the effusion in the left perihilar region. These areas are concerning for metastases. Right lung is clear. The heart size and pulmonary vascularity are normal. No adenopathy is appreciable by radiography. There is aortic atherosclerosis. There are foci of carotid artery calcification. There are wedge fractures in the midthoracic region. There are no blastic or lytic bone lesions. IMPRESSION: Suspect metastatic foci in left lung. There is a well-defined nodular lesion in the anterior segment left upper lobe which is certainly suspicious for metastasis. There  is a large left pleural effusion. Right lung is clear. Cardiac silhouette is within normal limits. Aortic Atherosclerosis (ICD10-I70.0). There are foci of carotid artery calcification. Given concern for metastatic disease, chest CT, ideally with intravenous contrast, may be advisable. Electronically Signed   By: Lowella Grip III M.D.   On: 03/30/2018 16:16    EKG: Independently reviewed.  Sinus rhythm, rate 89  Assessment/Plan  Symptomatic anemia/GI bleed/acute blood loss anemia -Hemoglobin on admission 9.2, however back in 2018, hemoglobin was 16 -FOBT positive -Patient presenting with fatigue, black tarry stools, shortness of breath -Patient endorses using Advil -Gastroenterology consulted and appreciated -On Protonix drip, IV fluids -Will transfuse 1 unit PRBC  Falls -Patient has been falling lately, likely secondary to anemia versus cancer -Consult PT OT  Chronic pain -Will place patient on morphine  Anxiety -Place patient on low-dose Ativan  History of lung cancer -Chest x-ray reviewed, showing metastatic foci in left lung.  Well-defined nodular lesion in the anterior segment left upper lobe, suspicious for metastasis.  Large left pleural effusion -Patient was seen in Dr. Earlie Server has had radiation in the past -Will order thoracentesis  History of AAA -Stent graft repair infrarenal abdominal aortic aneurysm, 09/08/2016  -Patient was to have repeat CTA of the abdomen pelvis however was too weak to make it to that appointment; will order   Tobacco abuse -no intentions of quitting -will order nicotine patch  CODE STATUS -Discussed with patient and son, currently DNI.  DVT prophylaxis: SCDs  Code Status: Full  Family Communication: Son at bedside. Admission, patients condition and plan of care including tests being ordered have been discussed with the patient and son who indicate understanding and agree with the plan and Code Status.  Disposition Plan:  TBD  Consults called: Gastroenterology   Admission status: Observation. However, patient will likely require 2 midnight stay given his GI bleed and weakness as well as work up.   Time spent: 70 minutes  Manly Nestle D.O. [facility]  Between 7am to 7pm - Please see pager noted on [email or web address]  After 7pm go to [email or web address] 03/30/2018, 5:42 PM

## 2018-03-30 NOTE — ED Notes (Signed)
Patient transported to X-ray 

## 2018-03-30 NOTE — ED Provider Notes (Signed)
[facility] EMERGENCY DEPARTMENT Provider Note   CSN: [id number] Arrival date & time: 03/30/18  1249    History   Chief Complaint Chief Complaint  Patient presents with  . GI Bleeding    HPI Tyler Mcmillan is a 73 y.o. male.     Pt presents to the ED today with multiple complaints.  The pt said he feels weak and has fatigue and dizziness.  The pt has seen black, tarry stools for a few months.  It has worsened in the last week.  No blood thinners.  Pt has a hx of an abnormal colonoscopy (Dr. Paulita Fujita is his GI doc).  The pt never went back to get the polyps removed.  The pt also has a cough with a hx of lung cancer.  He also has a hx of a AAA which was repaired by Dr. Donnetta Hutching (vascular).  He said his legs have been swollen and painful since then.  He said he's gone back, but they can't figure out why his legs are swollen.  He's tried diuretics and compression hose without improvement.  His back hurts and he's falling a lot.  He said he's supposed to have a CT scan to evaluate his aorta, but it is too hard to get in and out of the car, so he has not done that yet.       Past Medical History:  Diagnosis Date  . AAA (abdominal aortic aneurysm) without rupture ([place])    [place] ABD.  DONE  JUNE 2014  3.5  . Bladder cancer ([place])   . Bladder neoplasm   . Chronic anxiety   . GERD (gastroesophageal reflux disease)   . Hypertension   . 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 Malignant neoplasm of bronchus of left upper lobe ([place]) 08/09/2016  . Hypertensive heart disease without heart failure   . Chest pain 07/02/2016  . Cavitary lesion of lung 07/02/2016  . Unstable angina ([place])   . AAA (abdominal aortic aneurysm) ([place]) 07/09/2014  . Lumbar degenerative disc disease 07/09/2014    Past Surgical History:  Procedure Laterality Date  . ABDOMINAL AORTIC ENDOVASCULAR STENT GRAFT N/A 09/08/2016   Procedure: ABDOMINAL AORTIC ENDOVASCULAR STENT GRAFT;  Surgeon: [name], MD;  Location: [facility];  Service: Vascular;  Laterality: N/A;  . CYSTOSCOPY W/ URETERAL STENT PLACEMENT Left 10/04/2012   Procedure: CYSTOSCOPY WITH RETROGRADE PYELOGRAM/URETERAL STENT PLACEMENT   "POSSIBLE LEFT STENT";  Surgeon: Alexis Frock, MD;  Location: [facility];  Service: Urology;  Laterality: Left;  . LEFT HEART CATH AND CORONARY ANGIOGRAPHY N/A 07/02/2016   Procedure: Left Heart Cath and Coronary Angiography;  Surgeon: Martinique, Peter M, MD;  Location: [facility];  Service: Cardiovascular;  Laterality: N/A;  . MEATOTOMY N/A 10/04/2012   Procedure: MEATOTOMY ADULT;  Surgeon: Alexis Frock, MD;  Location: [facility];  Service: Urology;  Laterality: N/A;  . TRANSURETHRAL RESECTION OF BLADDER TUMOR WITH GYRUS (TURBT-GYRUS) N/A 10/04/2012   Procedure: TRANSURETHRAL RESECTION OF BLADDER TUMOR WITH GYRUS (TURBT-GYRUS);  Surgeon: Alexis Frock, MD;  Location: [facility];  Service: Urology;  Laterality: N/A;  . VIDEO BRONCHOSCOPY Bilateral 07/19/2016   Procedure: VIDEO BRONCHOSCOPY WITH FLUORO;  Surgeon: Marshell Garfinkel, MD;  Location: [facility];  Service: Cardiopulmonary;  Laterality: Bilateral;        Home Medications    Prior to Admission  medications   Medication Sig Start Date End Date Taking? Authorizing Provider  clonazePAM (KLONOPIN) 0.5 MG tablet Take 0.5 mg by mouth 2 (two) times daily.    Yes [provider]  ibuprofen (ADVIL,MOTRIN) 200 MG tablet Take 400 mg by mouth daily as needed (for pain).    Yes [provider]  amLODipine (NORVASC) 2.5 MG tablet Take 1 tablet (2.5 mg total) by mouth daily. Patient not taking: Reported on 03/30/2018 07/04/16   Hosie Poisson, MD  Diclofenac Sodium CR (VOLTAREN-XR) 100 MG 24 hr tablet Take 1 tablet (100 mg total) by mouth daily. Patient not taking: Reported on 03/30/2018 11/20/16   Palumbo, April,  MD  gabapentin (NEURONTIN) 300 MG capsule Take 1 capsule (300 mg total) by mouth 3 (three) times daily. Patient not taking: Reported on 03/30/2018 04/25/17   Hayden Pedro, PA-C    Family History Family History  Problem Relation Age of Onset  . Cancer Mother   . Heart disease Father        before age 73  . Heart attack Father   . Cancer Brother     Social History Social History   Tobacco Use  . Smoking status: Current Every Day Smoker    Packs/day: 0.50    Years: 50.00    Pack years: 25.00    Types: Cigarettes  . Smokeless tobacco: Never Used  . Tobacco comment: pt refuses info  Substance Use Topics  . Alcohol use: Yes    Alcohol/week: 24.0 standard drinks    Types: 10 Glasses of wine, 14 Cans of beer per week    Comment: few glases wine per day, beer  . Drug use: No     Allergies   Patient has no known allergies.   Review of Systems Review of Systems  Constitutional: Positive for fatigue.  Respiratory: Positive for cough and shortness of breath.   Cardiovascular: Positive for leg swelling.  Gastrointestinal:       Black stool  Musculoskeletal: Positive for back pain.  Neurological: Positive for weakness.  All other systems reviewed and are negative.    Physical Exam Updated Vital Signs BP 115/76   Pulse 91   Temp 97.7 F (36.5 C) (Oral)   Resp (!) 24   Ht 5\' 10"  (1.778 m)   Wt 77.1 kg   SpO2 98%   BMI 24.39 kg/m   Physical Exam Vitals signs and nursing note reviewed.  Constitutional:      Appearance: He is ill-appearing.  HENT:     Head: Normocephalic and atraumatic.     Right Ear: External ear normal.     Left Ear: External ear normal.     Nose: Nose normal.     Mouth/Throat:     Mouth: Mucous membranes are moist.  Eyes:     Extraocular Movements: Extraocular movements intact.     Conjunctiva/sclera: Conjunctivae normal.     Pupils: Pupils are equal, round, and reactive to light.  Neck:     Musculoskeletal: Normal range of  motion and neck supple.  Cardiovascular:     Rate and Rhythm: Normal rate and regular rhythm.     Pulses: Normal pulses.     Heart sounds: Normal heart sounds.  Pulmonary:     Effort: Pulmonary effort is normal.     Breath sounds: Normal breath sounds.  Abdominal:     General: Abdomen is flat.  Genitourinary:    Rectum: Guaiac result positive.  Musculoskeletal:     Right lower  leg: Edema present.     Left lower leg: Edema present.  Skin:    General: Skin is warm.     Capillary Refill: Capillary refill takes less than 2 seconds.  Neurological:     General: No focal deficit present.     Mental Status: He is alert and oriented to person, place, and time.  Psychiatric:        Mood and Affect: Mood normal.        Behavior: Behavior normal.      ED Treatments / Results  Labs (all labs ordered are listed, but only abnormal results are displayed) Labs Reviewed  COMPREHENSIVE METABOLIC PANEL - Abnormal; Notable for the following components:      Result Value   Glucose, Bld 130 (*)    Calcium 8.7 (*)    Total Protein 6.4 (*)    Albumin 2.6 (*)    AST 60 (*)    Total Bilirubin 0.2 (*)    All other components within normal limits  CBC - Abnormal; Notable for the following components:   RBC 2.67 (*)    Hemoglobin 9.2 (*)    HCT 29.9 (*)    MCV 112.0 (*)    MCH 34.5 (*)    All other components within normal limits  POC OCCULT BLOOD, ED - Abnormal; Notable for the following components:   Fecal Occult Bld POSITIVE (*)    All other components within normal limits  TROPONIN I  URINALYSIS, ROUTINE W REFLEX MICROSCOPIC  TYPE AND SCREEN    EKG EKG Interpretation  Date/Time:  Thursday March 30 2018 15:27:04 EST Ventricular Rate:  89 PR Interval:    QRS Duration: 94 QT Interval:  365 QTC Calculation: 447 R Axis:   -19 Text Interpretation:  Sinus rhythm Borderline left axis deviation Borderline low voltage, extremity leads Baseline wander in lead(s) V3 No significant  change since last tracing Confirmed by Isla Pence [phone]) on 03/30/2018 3:29:19 PM   Radiology Dg Chest 2 View  Result Date: 03/30/2018 CLINICAL DATA:  Fatigue and weakness. Reported history of lung carcinoma EXAM: CHEST - 2 VIEW COMPARISON:  May 16, 2017 FINDINGS: There is a sizable left pleural effusion. There is a nodular opacity in the anterior segment of the left upper lobe measuring 3.2 x 3.1 x 3.4 cm. There are suspected nodular lesions partially obscured by the effusion in the left perihilar region. These areas are concerning for metastases. Right lung is clear. The heart size and pulmonary vascularity are normal. No adenopathy is appreciable by radiography. There is aortic atherosclerosis. There are foci of carotid artery calcification. There are wedge fractures in the midthoracic region. There are no blastic or lytic bone lesions. IMPRESSION: Suspect metastatic foci in left lung. There is a well-defined nodular lesion in the anterior segment left upper lobe which is certainly suspicious for metastasis. There is a large left pleural effusion. Right lung is clear. Cardiac silhouette is within normal limits. Aortic Atherosclerosis (ICD10-I70.0). There are foci of carotid artery calcification. Given concern for metastatic disease, chest CT, ideally with intravenous contrast, may be advisable. Electronically Signed   By: Lowella Grip III M.D.   On: 03/30/2018 16:16    Procedures Procedures (including critical care time)  Medications Ordered in ED Medications  pantoprazole (PROTONIX) 80 mg in sodium chloride 0.9 % 100 mL IVPB (has no administration in time range)  pantoprazole (PROTONIX) 80 mg in sodium chloride 0.9 % 250 mL (0.32 mg/mL) infusion (has no administration in time range)  sodium chloride 0.9 % bolus 1,000 mL (1,000 mLs Intravenous New Bag/Given 03/30/18 1535)  morphine 4 MG/ML injection 4 mg (4 mg Intravenous Given 03/30/18 1547)  ondansetron (ZOFRAN) injection 4 mg (4 mg  Intravenous Given 03/30/18 1543)  diphenhydrAMINE (BENADRYL) injection 12.5 mg (12.5 mg Intravenous Given 03/30/18 1541)     Initial Impression / Assessment and Plan / ED Course  I have reviewed the triage vital signs and the nursing notes.  Pertinent labs & imaging results that were available during my care of the patient were reviewed by me and considered in my medical decision making (see chart for details).    CRITICAL CARE Performed by: Isla Pence   Total critical care time: 30 minutes  Critical care time was exclusive of separately billable procedures and treating other patients.  Critical care was necessary to treat or prevent imminent or life-threatening deterioration.  Critical care was time spent personally by me on the following activities: development of treatment plan with patient and/or surrogate as well as nursing, discussions with consultants, evaluation of patient's response to treatment, examination of patient, obtaining history from patient or surrogate, ordering and performing treatments and interventions, ordering and review of laboratory studies, ordering and review of radiographic studies, pulse oximetry and re-evaluation of patient's condition.   Hgb has dropped from 16.3 on 11/20/16 to 9.2 today.  The pt's stool is melanotic and guaiac +, so she was started on protonix.  Pt d/w [facility] (Dr. Penelope Coop) who will see pt in am.  Keep pt NPO.  Pt also has a large left pleural effusion and mets are in left lung.  Pain has improved with meds.  Pt looks more comfortable.  Pt d/w Dr. Ree Kida (triad) for admission.  Final Clinical Impressions(s) / ED Diagnoses   Final diagnoses:  Acute GI bleeding  Pleural effusion on left  Malignant neoplasm metastatic to left lung [facility])  Peripheral edema  Acute post-hemorrhagic anemia    ED Discharge Orders    None       Isla Pence, MD 03/30/18 [phone]

## 2018-03-30 NOTE — ED Triage Notes (Signed)
Pt to ER for evaluation of black tarry stools x "months" with increasing weakness, fatigue, and dizziness. Not on anticoagulation. Reports has not seen PCP in 6 years at minimum, abnormal colonoscopy's in the past with polyps concerning for malignancy. Tearful at this time.

## 2018-03-31 ENCOUNTER — Observation Stay (HOSPITAL_COMMUNITY): Payer: Medicare Other

## 2018-03-31 ENCOUNTER — Observation Stay (HOSPITAL_COMMUNITY): Payer: Medicare Other | Admitting: Anesthesiology

## 2018-03-31 ENCOUNTER — Encounter (HOSPITAL_COMMUNITY): Payer: Self-pay | Admitting: *Deleted

## 2018-03-31 ENCOUNTER — Encounter (HOSPITAL_COMMUNITY)
Admission: EM | Disposition: A | Discharge status: Skilled Nursing Facility | Payer: Self-pay | Source: Home / Self Care | Attending: Internal Medicine

## 2018-03-31 DIAGNOSIS — J918 Pleural effusion in other conditions classified elsewhere: Secondary | ICD-10-CM | POA: Diagnosis present

## 2018-03-31 DIAGNOSIS — R609 Edema, unspecified: Secondary | ICD-10-CM

## 2018-03-31 DIAGNOSIS — J9621 Acute and chronic respiratory failure with hypoxia: Secondary | ICD-10-CM | POA: Diagnosis present

## 2018-03-31 DIAGNOSIS — Z9181 History of falling: Secondary | ICD-10-CM | POA: Diagnosis not present

## 2018-03-31 DIAGNOSIS — Z85118 Personal history of other malignant neoplasm of bronchus and lung: Secondary | ICD-10-CM | POA: Diagnosis not present

## 2018-03-31 DIAGNOSIS — L89151 Pressure ulcer of sacral region, stage 1: Secondary | ICD-10-CM | POA: Diagnosis present

## 2018-03-31 DIAGNOSIS — K922 Gastrointestinal hemorrhage, unspecified: Secondary | ICD-10-CM | POA: Diagnosis not present

## 2018-03-31 DIAGNOSIS — K264 Chronic or unspecified duodenal ulcer with hemorrhage: Secondary | ICD-10-CM | POA: Diagnosis present

## 2018-03-31 DIAGNOSIS — Y92009 Unspecified place in unspecified non-institutional (private) residence as the place of occurrence of the external cause: Secondary | ICD-10-CM

## 2018-03-31 DIAGNOSIS — D62 Acute posthemorrhagic anemia: Secondary | ICD-10-CM

## 2018-03-31 DIAGNOSIS — E440 Moderate protein-calorie malnutrition: Secondary | ICD-10-CM | POA: Diagnosis present

## 2018-03-31 DIAGNOSIS — R0600 Dyspnea, unspecified: Secondary | ICD-10-CM | POA: Diagnosis not present

## 2018-03-31 DIAGNOSIS — Z9689 Presence of other specified functional implants: Secondary | ICD-10-CM | POA: Diagnosis not present

## 2018-03-31 DIAGNOSIS — R531 Weakness: Secondary | ICD-10-CM

## 2018-03-31 DIAGNOSIS — C7802 Secondary malignant neoplasm of left lung: Secondary | ICD-10-CM

## 2018-03-31 DIAGNOSIS — Z923 Personal history of irradiation: Secondary | ICD-10-CM | POA: Diagnosis not present

## 2018-03-31 DIAGNOSIS — Z791 Long term (current) use of non-steroidal anti-inflammatories (NSAID): Secondary | ICD-10-CM | POA: Diagnosis not present

## 2018-03-31 DIAGNOSIS — R5383 Other fatigue: Secondary | ICD-10-CM | POA: Diagnosis present

## 2018-03-31 DIAGNOSIS — R296 Repeated falls: Secondary | ICD-10-CM | POA: Diagnosis present

## 2018-03-31 DIAGNOSIS — T82330D Leakage of aortic (bifurcation) graft (replacement), subsequent encounter: Secondary | ICD-10-CM | POA: Diagnosis not present

## 2018-03-31 DIAGNOSIS — I10 Essential (primary) hypertension: Secondary | ICD-10-CM | POA: Diagnosis present

## 2018-03-31 DIAGNOSIS — Z8679 Personal history of other diseases of the circulatory system: Secondary | ICD-10-CM | POA: Diagnosis not present

## 2018-03-31 DIAGNOSIS — Z95828 Presence of other vascular implants and grafts: Secondary | ICD-10-CM | POA: Diagnosis not present

## 2018-03-31 DIAGNOSIS — Z86010 Personal history of colonic polyps: Secondary | ICD-10-CM | POA: Diagnosis not present

## 2018-03-31 DIAGNOSIS — W19XXXA Unspecified fall, initial encounter: Secondary | ICD-10-CM

## 2018-03-31 DIAGNOSIS — Z79899 Other long term (current) drug therapy: Secondary | ICD-10-CM | POA: Diagnosis not present

## 2018-03-31 DIAGNOSIS — J9383 Other pneumothorax: Secondary | ICD-10-CM | POA: Diagnosis not present

## 2018-03-31 DIAGNOSIS — F419 Anxiety disorder, unspecified: Secondary | ICD-10-CM | POA: Diagnosis present

## 2018-03-31 DIAGNOSIS — F17210 Nicotine dependence, cigarettes, uncomplicated: Secondary | ICD-10-CM | POA: Diagnosis present

## 2018-03-31 DIAGNOSIS — G8929 Other chronic pain: Secondary | ICD-10-CM | POA: Diagnosis present

## 2018-03-31 DIAGNOSIS — K7460 Unspecified cirrhosis of liver: Secondary | ICD-10-CM | POA: Diagnosis present

## 2018-03-31 DIAGNOSIS — J90 Pleural effusion, not elsewhere classified: Secondary | ICD-10-CM

## 2018-03-31 DIAGNOSIS — Y832 Surgical operation with anastomosis, bypass or graft as the cause of abnormal reaction of the patient, or of later complication, without mention of misadventure at the time of the procedure: Secondary | ICD-10-CM | POA: Diagnosis present

## 2018-03-31 DIAGNOSIS — Z8551 Personal history of malignant neoplasm of bladder: Secondary | ICD-10-CM | POA: Diagnosis not present

## 2018-03-31 HISTORY — PX: ESOPHAGOGASTRODUODENOSCOPY (EGD) WITH PROPOFOL: SHX5813

## 2018-03-31 HISTORY — PX: IR THORACENTESIS ASP PLEURAL SPACE W/IMG GUIDE: IMG5380

## 2018-03-31 HISTORY — PX: BIOPSY: SHX5522

## 2018-03-31 LAB — GRAM STAIN

## 2018-03-31 LAB — PROTEIN, PLEURAL OR PERITONEAL FLUID: Total protein, fluid: 3.1 g/dL

## 2018-03-31 LAB — TYPE AND SCREEN
ABO/RH(D): AB POS
Antibody Screen: NEGATIVE
Unit division: 0

## 2018-03-31 LAB — BASIC METABOLIC PANEL
Anion gap: 9 (ref 5–15)
BUN: 8 mg/dL (ref 8–23)
CALCIUM: 8.2 mg/dL — AB (ref 8.9–10.3)
CO2: 23 mmol/L (ref 22–32)
Chloride: 106 mmol/L (ref 98–111)
Creatinine, Ser: 0.9 mg/dL (ref 0.61–1.24)
GFR calc Af Amer: >60 mL/min (ref >60)
GFR calc non Af Amer: >60 mL/min (ref >60)
Glucose, Bld: 87 mg/dL (ref 70–99)
Potassium: 3.8 mmol/L (ref 3.5–5.1)
Sodium: 138 mmol/L (ref 135–145)

## 2018-03-31 LAB — BPAM RBC
Blood Product Expiration Date: 202003172359
ISSUE DATE / TIME: 202002202339
Unit Type and Rh: 6200

## 2018-03-31 LAB — URINALYSIS, ROUTINE W REFLEX MICROSCOPIC
BACTERIA UA: NONE SEEN
Bilirubin Urine: NEGATIVE
Glucose, UA: NEGATIVE mg/dL
KETONES UR: NEGATIVE mg/dL
Leukocytes,Ua: NEGATIVE
Nitrite: NEGATIVE
Protein, ur: NEGATIVE mg/dL
Specific Gravity, Urine: 1.042 — ABNORMAL HIGH (ref 1.005–1.030)
pH: 5 (ref 5.0–8.0)

## 2018-03-31 LAB — CBC
HCT: 30.5 % — ABNORMAL LOW (ref 39.0–52.0)
Hemoglobin: 9.7 g/dL — ABNORMAL LOW (ref 13.0–17.0)
MCH: 34.2 pg — ABNORMAL HIGH (ref 26.0–34.0)
MCHC: 31.8 g/dL (ref 30.0–36.0)
MCV: 107.4 fL — ABNORMAL HIGH (ref 80.0–100.0)
PLATELETS: 243 10*3/uL (ref 150–400)
RBC: 2.84 MIL/uL — ABNORMAL LOW (ref 4.22–5.81)
RDW: 16.2 % — ABNORMAL HIGH (ref 11.5–15.5)
WBC: 4.8 10*3/uL (ref 4.0–10.5)
nRBC: 0 % (ref 0.0–0.2)

## 2018-03-31 SURGERY — ESOPHAGOGASTRODUODENOSCOPY (EGD) WITH PROPOFOL
Anesthesia: Monitor Anesthesia Care

## 2018-03-31 MED ORDER — LACTATED RINGERS IV SOLN
INTRAVENOUS | Status: AC | PRN
  Administered 2018-03-31: 1000 mL via INTRAVENOUS

## 2018-03-31 MED ORDER — PROPOFOL 10 MG/ML IV BOLUS
INTRAVENOUS | Status: DC | PRN
  Administered 2018-03-31: 20 mg via INTRAVENOUS
  Administered 2018-03-31: 10 mg via INTRAVENOUS

## 2018-03-31 MED ORDER — PANTOPRAZOLE SODIUM 40 MG PO TBEC
40.0000 mg | DELAYED_RELEASE_TABLET | Freq: Two times a day (BID) | ORAL | Status: DC
  Administered 2018-03-31 – 2018-04-10 (×20): 40 mg via ORAL
  Filled 2018-03-31 (×20): qty 1

## 2018-03-31 MED ORDER — PROPOFOL 500 MG/50ML IV EMUL
INTRAVENOUS | Status: DC | PRN
  Administered 2018-03-31: 100 ug/kg/min via INTRAVENOUS

## 2018-03-31 MED ORDER — LIDOCAINE HCL 1 % IJ SOLN
INTRAMUSCULAR | Status: DC | PRN
  Administered 2018-03-31 – 2018-04-03 (×2): 10 mL

## 2018-03-31 MED ORDER — LIDOCAINE HCL 1 % IJ SOLN
INTRAMUSCULAR | Status: AC
  Filled 2018-03-31: qty 20

## 2018-03-31 MED ORDER — ENSURE ENLIVE PO LIQD
237.0000 mL | Freq: Two times a day (BID) | ORAL | Status: DC
  Administered 2018-04-01 – 2018-04-07 (×14): 237 mL via ORAL

## 2018-03-31 SURGICAL SUPPLY — 14 items

## 2018-03-31 NOTE — Op Note (Signed)
[facility] Patient Name: Tyler Mcmillan Procedure Date : 03/31/2018 MRN: [id number] Attending MD: Wonda Horner , MD Date of Birth: [birth year] CSN: [id number] Age: 73 Admit Type: Inpatient Procedure:                Upper GI endoscopy Indications:              [place] Providers:                Wonda Horner, MD, Burtis Junes, RN, Cletis Athens,                            Technician, Merrilyn Puma, CRNA Referring MD:              Medicines:                Propofol per Anesthesia Complications:            No immediate complications. Estimated Blood Loss:     Estimated blood loss was minimal. Procedure:                Pre-Anesthesia Assessment:                           - Prior to the procedure, a History and Physical                            was performed, and patient medications and                            allergies were reviewed. The patient's tolerance of                            previous anesthesia was also reviewed. The risks                            and benefits of the procedure and the sedation                            options and risks were discussed with the patient.                            All questions were answered, and informed consent                            was obtained. Prior Anticoagulants: The patient has                            taken no previous anticoagulant or antiplatelet                            agents. ASA Grade Assessment: III - A patient with                            severe systemic disease. After reviewing the risks  and benefits, the patient was deemed in                            satisfactory condition to undergo the procedure.                           After obtaining informed consent, the endoscope was                            passed under direct vision. Throughout the                            procedure, the patient's blood pressure, pulse, and                            oxygen saturations were  monitored continuously. The                            GIF-H190 ([id number]) Olympus gastroscope was                            introduced through the mouth, and advanced to the                            second part of duodenum. The upper GI endoscopy was                            accomplished without difficulty. The patient                            tolerated the procedure well. Scope In: Scope Out: Findings:      The examined esophagus was normal.      Mildly erythematous mucosa was found in the gastric antrum. Biopsies       were taken with a cold forceps for Helicobacter pylori testing.      One non-bleeding cratered duodenal ulcer with no stigmata of bleeding       was found in the duodenal bulb. The lesion was 10 mm in largest       dimension. Impression:               - Normal esophagus.                           - Erythematous mucosa in the antrum. Biopsied.                           - One non-bleeding duodenal ulcer with no stigmata                            of bleeding. Recommendation:           - Resume regular diet.                           - Continue present medications.                           -  Would put him on Bid PPI. Advance diet as                            tolerated. Home tomorrow if stable. Follow up with                            Dr Paulita Fujita. Will need follow up on H pylori biopsy                            and he will need outpatient colonoscopy. We will                            sign off/ Call us if needed. Procedure Code(s):        --- Professional ---                           [phone], Esophagogastroduodenoscopy, flexible,                            transoral; with biopsy, single or multiple Diagnosis Code(s):        --- Professional ---                           K31.89, Other diseases of stomach and duodenum                           K26.9, Duodenal ulcer, unspecified as acute or                            chronic, without hemorrhage or perforation                            K92.1, Melena (includes Hematochezia) CPT copyright 2018 [employer]. All rights reserved. The codes documented in this report are preliminary and upon coder review may  be revised to meet current compliance requirements. Wonda Horner, MD 03/31/2018 10:20:12 AM This report has been signed electronically. Number of Addenda: 0

## 2018-03-31 NOTE — Progress Notes (Signed)
OT Cancellation Note  Patient Details Name: Tyler Mcmillan MRN: [id number] DOB: [birth year]   Cancelled Treatment:    Reason Eval/Treat Not Completed: Patient at procedure or test/ unavailable. Pt in OR, will check back when appropriate  Britt Bottom 03/31/2018, 9:35 AM

## 2018-03-31 NOTE — Progress Notes (Addendum)
PROGRESS NOTE    Tyler Mcmillan  IRJ:[id number] DOB: [birth year] DOA: 03/30/2018 PCP: [name], MD   Brief Narrative:  HPI on 03/30/2018  Tyler Mcmillan is a 73 y.o. male with a medical history of lung cancer, AAA, anxiety, who presented to the emergency department with complaints of fatigue, weakness, dizziness, dark stools, for several months however worsened in the past week.  Patient has not been on any blood thinners but endorses using Advil for pain.  Patient has seen gastroenterology in the past approximately 6 years ago and had a colonoscopy and endoscopy at that time.  Patient does state that his colonoscopy was abnormal however he never followed up.  Patient was also noted to have a AAA with stent placement by Dr. Donnetta Hutching back in 2018, and was supposed to have a CTA recently however was unable to make it to that appointment.  Endorses falling frequently and unable to move well.  His son is at bedside and also states the same.  Complains of having lower extremity swelling which is been ongoing since 2018 since his stent placement.  He denies current chest pain, shortness of breath, abdominal pain, nausea or vomiting, diarrhea or constipation, painful urination, headache, recent illness or travel.  Assessment & Plan   Symptomatic anemia/GI bleed/acute blood loss anemia -Hemoglobin on admission 9.2, however back in 2018, hemoglobin was 16 -FOBT positive -Patient presenting with fatigue, black tarry stools, shortness of breath -Patient endorses using Advil -Gastroenterology consulted and appreciated -was placed on Protonix drip- will transition to oral PPI BID -s/p EGD: Normal esophagus, erythematous mucosa in the antrum, biopsied.  1 nonbleeding duodenal ulcer with no stigmata of bleeding.  Recommended PPI twice daily.  Home tomorrow if stable.  GI ollow-up on H. pylori biopsy.  Outpatient colonoscopy -Patient transfused 1 unit PRBC, hemoglobin only improved to 9.7 -Continue to  monitor  Falls -Patient has been falling lately, likely secondary to anemia versus cancer -PT an OT consulted  Chronic pain -Continue morphine PRN -will add on oral pain medication as well  Anxiety -Continue low-dose Ativan  History of lung cancer with pleural effusion -Chest x-ray reviewed, showing metastatic foci in left lung.  Well-defined nodular lesion in the anterior segment left upper lobe, suspicious for metastasis.  Large left pleural effusion -Patient was seen in Dr. Earlie Server has had radiation in the past -Pending thoracentesis, will send for cytology  Acute dyspnea -Possibly secondary to anemia versus pleural effusion -Treatment plan as above  History of AAA -Stent graft repair infrarenal abdominal aortic aneurysm, 09/08/2016  -Patient was to have repeat CTA of the abdomen pelvis however was too weak to make it to that appointment -CTA A/P as below -Discussed with Dr. Donnetta Hutching, vascular surgery- consulted and appreciated  Tobacco abuse -no intentions of quitting -Cotninue nicotine patch  CODE STATUS -Discussed with patient and son, currently DNI.  DVT Prophylaxis  SCDs  Code Status: DNI  Family Communication: None at bedside  Disposition Plan: TBD. Pending physical and occupational therapy. Pending thoracentesis and vascular surgery consult.   Given drop in hemoglobin and continued dyspnea, patient will require additional monitoring and possible intervention.   Consultants Gastroenterology Vascular surgery  Procedures  EGD  Antibiotics   Anti-infectives (From admission, onward)   None      Subjective:   Tyler Mcmillan seen and examined today.  Continues to feel weak and some shortness of breath.  Denies current chest pain, abdominal pain, nausea or vomiting, diarrhea or constipation, dizziness or headache.  Objective:  Vitals:   03/31/18 0852 03/31/18 1000 03/31/18 1015 03/31/18 1035  BP: (!) 146/80 (!) 94/44 (!) 112/50 132/80  Pulse:     89  Resp: 20 18 20 18   Temp:    97.7 F (36.5 C)  TempSrc:    Oral  SpO2: 97% 100% 100% 95%  Weight: 77.8 kg     Height: 5\' 10"  (1.778 m)       Intake/Output Summary (Last 24 hours) at 03/31/2018 1149 Last data filed at 03/31/2018 0955 Gross per 24 hour  Intake 200 ml  Output -  Net 200 ml   Filed Weights   03/30/18 1529 03/30/18 1950 03/31/18 0852  Weight: 77.1 kg 77.8 kg 77.8 kg    Exam  General: Well developed,chronically ill-appearing, NAD  HEENT: NCAT, mucous membranes moist.   Neck: Supple  Cardiovascular: S1 S2 auscultated, RRR  Respiratory: Diminished on the left side, otherwise clear  Abdomen: Soft, nontender, nondistended, + bowel sounds  Extremities: warm dry without cyanosis clubbing. +++LE Edema B/L   Neuro: AAOx3, nonfocal  Psych: Anxious, however appropriate   Data Reviewed: I have personally reviewed following labs and imaging studies  CBC: Recent Labs  Lab 03/30/18 1303 03/31/18 0519  WBC 8.4 4.8  HGB 9.2* 9.7*  HCT 29.9* 30.5*  MCV 112.0* 107.4*  PLT 302 160   Basic Metabolic Panel: Recent Labs  Lab 03/30/18 1303 03/31/18 0519  NA 137 138  K 4.7 3.8  CL 104 106  CO2 22 23  GLUCOSE 130* 87  BUN 15 8  CREATININE 1.02 0.90  CALCIUM 8.7* 8.2*   GFR: Estimated Creatinine Clearance: 76.6 mL/min (by C-G formula based on SCr of 0.9 mg/dL). Liver Function Tests: Recent Labs  Lab 03/30/18 1303  AST 60*  ALT 36  ALKPHOS 115  BILITOT 0.2*  PROT 6.4*  ALBUMIN 2.6*   No results for input(s): LIPASE, AMYLASE in the last 168 hours. No results for input(s): AMMONIA in the last 168 hours. Coagulation Profile: No results for input(s): INR, PROTIME in the last 168 hours. Cardiac Enzymes: Recent Labs  Lab 03/30/18 1531  TROPONINI <0.03   BNP (last 3 results) No results for input(s): PROBNP in the last 8760 hours. HbA1C: No results for input(s): HGBA1C in the last 72 hours. CBG: No results for input(s): GLUCAP in the last 168  hours. Lipid Profile: No results for input(s): CHOL, HDL, LDLCALC, TRIG, CHOLHDL, LDLDIRECT in the last 72 hours. Thyroid Function Tests: No results for input(s): TSH, T4TOTAL, FREET4, T3FREE, THYROIDAB in the last 72 hours. Anemia Panel: No results for input(s): VITAMINB12, FOLATE, FERRITIN, TIBC, IRON, RETICCTPCT in the last 72 hours. Urine analysis:    Component Value Date/Time   COLORURINE YELLOW 03/31/2018 0630   APPEARANCEUR CLEAR 03/31/2018 0630   LABSPEC 1.042 (H) 03/31/2018 0630   PHURINE 5.0 03/31/2018 0630   GLUCOSEU NEGATIVE 03/31/2018 0630   HGBUR SMALL (A) 03/31/2018 0630   BILIRUBINUR NEGATIVE 03/31/2018 0630   KETONESUR NEGATIVE 03/31/2018 0630   PROTEINUR NEGATIVE 03/31/2018 0630   NITRITE NEGATIVE 03/31/2018 0630   LEUKOCYTESUR NEGATIVE 03/31/2018 0630   Sepsis Labs: @LABRCNTIP (procalcitonin:4,lacticidven:4)  )No results found for this or any previous visit (from the past 240 hour(s)).    Radiology Studies: Dg Chest 2 View  Result Date: 03/30/2018 CLINICAL DATA:  Fatigue and weakness. Reported history of lung carcinoma EXAM: CHEST - 2 VIEW COMPARISON:  May 16, 2017 FINDINGS: There is a sizable left pleural effusion. There is a nodular opacity in the anterior  segment of the left upper lobe measuring 3.2 x 3.1 x 3.4 cm. There are suspected nodular lesions partially obscured by the effusion in the left perihilar region. These areas are concerning for metastases. Right lung is clear. The heart size and pulmonary vascularity are normal. No adenopathy is appreciable by radiography. There is aortic atherosclerosis. There are foci of carotid artery calcification. There are wedge fractures in the midthoracic region. There are no blastic or lytic bone lesions. IMPRESSION: Suspect metastatic foci in left lung. There is a well-defined nodular lesion in the anterior segment left upper lobe which is certainly suspicious for metastasis. There is a large left pleural effusion. Right  lung is clear. Cardiac silhouette is within normal limits. Aortic Atherosclerosis (ICD10-I70.0). There are foci of carotid artery calcification. Given concern for metastatic disease, chest CT, ideally with intravenous contrast, may be advisable. Electronically Signed   By: Lowella Grip III M.D.   On: 03/30/2018 16:16   Ct Angio Chest Aorta W/cm &/or Wo/cm  Result Date: 03/30/2018 CLINICAL DATA:  Abnormal chest x-ray history of aortic aneurysm repair EXAM: CT ANGIOGRAPHY CHEST, ABDOMEN AND PELVIS TECHNIQUE: Multidetector CT imaging through the chest, abdomen and pelvis was performed using the standard protocol during bolus administration of intravenous contrast. Multiplanar reconstructed images and MIPs were obtained and reviewed to evaluate the vascular anatomy. CONTRAST:  17mL ISOVUE-370 IOPAMIDOL (ISOVUE-370) INJECTION 76% COMPARISON:  Radiograph 03/30/2018, 05/16/2017, CT chest 04/21/2017, 12/08/2016, CT angiogram 10/19/2016 FINDINGS: CTA CHEST FINDINGS Cardiovascular: Non contrasted images of the chest demonstrate moderate aortic atherosclerosis. No aneurysmal dilatation. Coronary vascular calcification. Normal heart size. Trace pericardial effusion. Following contrast, no dissection is seen. Central pulmonary arteries are patent. Mediastinum/Nodes: Midline trachea. No thyroid mass. No significant adenopathy. Esophagus within normal limits. Lungs/Pleura: Large left-sided pleural effusion. Moderate emphysema. Right lung is clear. Partial atelectasis in the left lower lobe. Cystic density, corresponding to radiographic abnormality in the left upper lobe, this measures 2.9 x 2.9 cm, previously 3.1 x 3.4 cm and is in the region of the previously demonstrated cavitary lesion. Scarring in the left upper lobe. No other suspicious pulmonary nodules. Musculoskeletal: No acute acute or suspicious abnormality. Review of the MIP images confirms the above findings. CTA ABDOMEN AND PELVIS FINDINGS VASCULAR Aorta:  Moderate aortic atherosclerosis. Status post endovascular repair of infrarenal abdominal aortic aneurysm just below the renal arteries to the bilateral common iliac arteries. There is graft patency. Residual aneurysm sac measures 5.8 x 6 cm as compared with 5.7 x 6 cm previously. Small enhancing vessels are again noted within the aneurysm sac. Celiac: Calcification at the origin without significant stenosis. SMA: Mild stenosis at the origin. Renals: Single right renal artery and 2 left renal arteries including a small accessory artery to the upper pole. Moderate focal stenosis at the origin of the dominant left renal artery as before. IMA: Not well opacified at the origin. Inflow: Both internal iliac arteries are patent. Atherosclerotic disease of the external iliac arteries. Veins: No obvious venous abnormality within the limitations of this arterial phase study. Review of the MIP images confirms the above findings. NON-VASCULAR Hepatobiliary: Slightly nodular liver contour, possible cirrhosis. Calcified stone at the gallbladder neck. No biliary dilatation Pancreas: Unremarkable. No pancreatic ductal dilatation or surrounding inflammatory changes. Spleen: Within normal limits. Adrenals/Urinary Tract: Adrenal glands are unremarkable. Kidneys are normal, without renal calculi, focal lesion, or hydronephrosis. Bladder is unremarkable. Stomach/Bowel: The stomach is within normal limits. No dilated small bowel. No colon wall thickening. Lymphatic: No significantly enlarged lymph nodes.  Reproductive: Slightly heterogeneous enhancement of the prostate with calcifications. Other: No free air. Small free fluid in the pelvis. Fat containing right greater than left inguinal hernia with possible small fluid in the right inguinal region. 2.4 cm subcutaneous cyst in the right gluteal region. Musculoskeletal: Acute to subacute fracture involving the L1 vertebral body with close to 50% loss of height anteriorly. No significant  retropulsion. Trace anterolisthesis L4 on L5. Review of the MIP images confirms the above findings. IMPRESSION: 1. Status post endovascular repair of distal infrarenal abdominal aortic aneurysm. The excluded aneurysm sac is unchanged in size as compared with 10/19/2016 and measures 5.8 x 6 cm. Arterial phase enhancement is again noted within the excluded sac, consistent with type 2 endoleak. 2. Large left-sided pleural effusion. Low-density lesion in the left upper lobe at the site of prior cavitary mass has further decreased in size and is felt to correspond to the radiographic nodule. No other suspicious lung nodules are seen. 3. Slightly nodular liver contour, possible cirrhosis 4. Gallstone 5. Heterogeneous enhancement of the prostate gland with calcification. 6. Small free fluid in the pelvis 7. Acute to subacute fracture involving L1 vertebral body with close to 50% loss of height anteriorly 8. No definite CT evidence for metastatic disease to the abdomen or pelvis Electronically Signed   By: Donavan Foil M.D.   On: 03/30/2018 23:06   Ct Angio Abd/pel W/ And/or W/o  Result Date: 03/30/2018 CLINICAL DATA:  Abnormal chest x-ray history of aortic aneurysm repair EXAM: CT ANGIOGRAPHY CHEST, ABDOMEN AND PELVIS TECHNIQUE: Multidetector CT imaging through the chest, abdomen and pelvis was performed using the standard protocol during bolus administration of intravenous contrast. Multiplanar reconstructed images and MIPs were obtained and reviewed to evaluate the vascular anatomy. CONTRAST:  166mL ISOVUE-370 IOPAMIDOL (ISOVUE-370) INJECTION 76% COMPARISON:  Radiograph 03/30/2018, 05/16/2017, CT chest 04/21/2017, 12/08/2016, CT angiogram 10/19/2016 FINDINGS: CTA CHEST FINDINGS Cardiovascular: Non contrasted images of the chest demonstrate moderate aortic atherosclerosis. No aneurysmal dilatation. Coronary vascular calcification. Normal heart size. Trace pericardial effusion. Following contrast, no dissection is  seen. Central pulmonary arteries are patent. Mediastinum/Nodes: Midline trachea. No thyroid mass. No significant adenopathy. Esophagus within normal limits. Lungs/Pleura: Large left-sided pleural effusion. Moderate emphysema. Right lung is clear. Partial atelectasis in the left lower lobe. Cystic density, corresponding to radiographic abnormality in the left upper lobe, this measures 2.9 x 2.9 cm, previously 3.1 x 3.4 cm and is in the region of the previously demonstrated cavitary lesion. Scarring in the left upper lobe. No other suspicious pulmonary nodules. Musculoskeletal: No acute acute or suspicious abnormality. Review of the MIP images confirms the above findings. CTA ABDOMEN AND PELVIS FINDINGS VASCULAR Aorta: Moderate aortic atherosclerosis. Status post endovascular repair of infrarenal abdominal aortic aneurysm just below the renal arteries to the bilateral common iliac arteries. There is graft patency. Residual aneurysm sac measures 5.8 x 6 cm as compared with 5.7 x 6 cm previously. Small enhancing vessels are again noted within the aneurysm sac. Celiac: Calcification at the origin without significant stenosis. SMA: Mild stenosis at the origin. Renals: Single right renal artery and 2 left renal arteries including a small accessory artery to the upper pole. Moderate focal stenosis at the origin of the dominant left renal artery as before. IMA: Not well opacified at the origin. Inflow: Both internal iliac arteries are patent. Atherosclerotic disease of the external iliac arteries. Veins: No obvious venous abnormality within the limitations of this arterial phase study. Review of the MIP images confirms the above  findings. NON-VASCULAR Hepatobiliary: Slightly nodular liver contour, possible cirrhosis. Calcified stone at the gallbladder neck. No biliary dilatation Pancreas: Unremarkable. No pancreatic ductal dilatation or surrounding inflammatory changes. Spleen: Within normal limits. Adrenals/Urinary Tract:  Adrenal glands are unremarkable. Kidneys are normal, without renal calculi, focal lesion, or hydronephrosis. Bladder is unremarkable. Stomach/Bowel: The stomach is within normal limits. No dilated small bowel. No colon wall thickening. Lymphatic: No significantly enlarged lymph nodes. Reproductive: Slightly heterogeneous enhancement of the prostate with calcifications. Other: No free air. Small free fluid in the pelvis. Fat containing right greater than left inguinal hernia with possible small fluid in the right inguinal region. 2.4 cm subcutaneous cyst in the right gluteal region. Musculoskeletal: Acute to subacute fracture involving the L1 vertebral body with close to 50% loss of height anteriorly. No significant retropulsion. Trace anterolisthesis L4 on L5. Review of the MIP images confirms the above findings. IMPRESSION: 1. Status post endovascular repair of distal infrarenal abdominal aortic aneurysm. The excluded aneurysm sac is unchanged in size as compared with 10/19/2016 and measures 5.8 x 6 cm. Arterial phase enhancement is again noted within the excluded sac, consistent with type 2 endoleak. 2. Large left-sided pleural effusion. Low-density lesion in the left upper lobe at the site of prior cavitary mass has further decreased in size and is felt to correspond to the radiographic nodule. No other suspicious lung nodules are seen. 3. Slightly nodular liver contour, possible cirrhosis 4. Gallstone 5. Heterogeneous enhancement of the prostate gland with calcification. 6. Small free fluid in the pelvis 7. Acute to subacute fracture involving L1 vertebral body with close to 50% loss of height anteriorly 8. No definite CT evidence for metastatic disease to the abdomen or pelvis Electronically Signed   By: Donavan Foil M.D.   On: 03/30/2018 23:06     Scheduled Meds: . sodium chloride   Intravenous Once  . lidocaine      . nicotine  14 mg Transdermal Daily  . pantoprazole  40 mg Oral BID   Continuous  Infusions: . sodium chloride 75 mL/hr (03/30/18 1845)     LOS: 0 days   Time Spent in minutes   30 minutes   Maddilynn Esperanza D.O. on 03/31/2018 at 11:49 AM  Between 7am to 7pm - Please see pager noted on [email or web address]  After 7pm go to [email or web address]  And look for the night coverage person covering for me after hours  [facility] Office  [phone]

## 2018-03-31 NOTE — Evaluation (Signed)
Physical Therapy Evaluation Patient Details Name: Tyler Mcmillan MRN: [id number] DOB: Aug 18, 1945 Today's Date: 03/31/2018   History of Present Illness  Pt is a 73 y/o male with a PMH significant for lung CA, AAA, anxiety who presents s/p fatigue, weakness, dizziness, dark stools and frequent falls for several months, acutely worsening in the last week. Pt has undergone thoracentesis and EGD.   Clinical Impression  Pt admitted with above diagnosis. Pt currently with functional limitations due to the deficits listed below (see PT Problem List). At the time of PT eval pt was able to perform transfers and ambulation with up to mod assist for balance support and safety. Pt with overall poor safety awareness and will require staff assist if OOB. Pt will benefit from skilled PT to increase their independence and safety with mobility to allow discharge to the venue listed below.       Follow Up Recommendations Home health PT;Supervision/Assistance - 24 hour    Equipment Recommendations  3in1 (PT)    Recommendations for Other Services       Precautions / Restrictions Precautions Precautions: Fall Restrictions Weight Bearing Restrictions: No      Mobility  Bed Mobility Overal bed mobility: Modified Independent             General bed mobility comments: Slow and effortful but without need for assistance  Transfers Overall transfer level: Needs assistance Equipment used: Rolling walker (2 wheeled) Transfers: Sit to/from Stand Sit to Stand: Mod assist         General transfer comment: Assist for power-up to full standing position. VC's for hand placement on seated surface for safety as pt wanting to pull to stand from center bar of walker instead  Ambulation/Gait Ambulation/Gait assistance: Min assist Gait Distance (Feet): 30 Feet Assistive device: Rolling walker (2 wheeled) Gait Pattern/deviations: Decreased stride length;Trunk flexed;Narrow base of support;Shuffle Gait  velocity: Decreased Gait velocity interpretation: <1.8 ft/sec, indicate of risk for recurrent falls General Gait Details: Pt ambulated into bathroom and then to recliner chair. VC's for closer walker proximity and safety. Pt with poor awareness and attempting to get rid of walker before he got to the toilet.   Stairs            Wheelchair Mobility    Modified Rankin (Stroke Patients Only)       Balance Overall balance assessment: Needs assistance Sitting-balance support: Feet supported;No upper extremity supported Sitting balance-Leahy Scale: Fair     Standing balance support: No upper extremity supported;During functional activity Standing balance-Leahy Scale: Poor Standing balance comment: Reliant on UE support to maintain standing balance                             Pertinent Vitals/Pain Pain Assessment: Faces Faces Pain Scale: No hurt Pain Intervention(s): Monitored during session    Home Living Family/patient expects to be discharged to:: Private residence Living Arrangements: Spouse/significant other;Children Available Help at Discharge: Family;Available 24 hours/day Type of Home: House Home Access: Stairs to enter   [employer] of Steps: 2-3 Home Layout: One level Home Equipment: Walker - 2 wheels;Cane - single point      Prior Function Level of Independence: Needs assistance   Gait / Transfers Assistance Needed: Has been using the walker lately - falling frequently           Hand Dominance        Extremity/Trunk Assessment   Upper Extremity Assessment Upper Extremity Assessment: Defer  to OT evaluation    Lower Extremity Assessment Lower Extremity Assessment: Generalized weakness    Cervical / Trunk Assessment Cervical / Trunk Assessment: Other exceptions Cervical / Trunk Exceptions: Forward head posture with rounded shoulders  Communication   Communication: No difficulties  Cognition Arousal/Alertness:  Awake/alert Behavior During Therapy: WFL for tasks assessed/performed Overall Cognitive Status: Impaired/Different from baseline Area of Impairment: Following commands;Safety/judgement;Attention;Problem solving                   Current Attention Level: Selective   Following Commands: Follows one step commands consistently;Follows one step commands with increased time Safety/Judgement: Decreased awareness of safety;Decreased awareness of deficits   Problem Solving: Slow processing;Difficulty sequencing;Requires verbal cues        General Comments      Exercises     Assessment/Plan    PT Assessment Patient needs continued PT services  PT Problem List Decreased strength;Decreased activity tolerance;Decreased balance;Decreased mobility;Decreased knowledge of use of DME;Decreased safety awareness;Decreased knowledge of precautions       PT Treatment Interventions DME instruction;Gait training;Stair training;Functional mobility training;Therapeutic activities;Therapeutic exercise;Neuromuscular re-education;Patient/family education    PT Goals (Current goals can be found in the Care Plan section)  Acute Rehab PT Goals Patient Stated Goal: "Get better if that's possible" PT Goal Formulation: With patient/family Time For Goal Achievement: 04/07/18 Potential to Achieve Goals: Good    Frequency Min 3X/week   Barriers to discharge        Co-evaluation               AM-PAC PT "6 Clicks" Mobility  Outcome Measure Help needed turning from your back to your side while in a flat bed without using bedrails?: None Help needed moving from lying on your back to sitting on the side of a flat bed without using bedrails?: None Help needed moving to and from a bed to a chair (including a wheelchair)?: A Little Help needed standing up from a chair using your arms (e.g., wheelchair or bedside chair)?: A Little Help needed to walk in hospital room?: A Little Help needed climbing  3-5 steps with a railing? : A Lot 6 Click Score: 19    End of Session Equipment Utilized During Treatment: Gait belt Activity Tolerance: Patient tolerated treatment well Patient left: in chair;with call bell/phone within reach;with chair alarm set;with family/visitor present Nurse Communication: Mobility status PT Visit Diagnosis: Unsteadiness on feet (R26.81);Difficulty in walking, not elsewhere classified (R26.2);Muscle weakness (generalized) (M62.81)    Time: 3664-4034 PT Time Calculation (min) (ACUTE ONLY): 20 min   Charges:   PT Evaluation $PT Eval Moderate Complexity: 1 Mod          [name], PT, DPT Acute Rehabilitation Services Pager: [phone] Office: [phone]   [name] 03/31/2018, 2:38 PM

## 2018-03-31 NOTE — Progress Notes (Signed)
Patient ID: Tyler Mcmillan, male   DOB: 1945/03/13, 73 y.o.   MRN: [id number] The patient is known to me from prior stent graft repair of 6 cm infrarenal abdominal aortic aneurysm August 2018.  He had uneventful surgery.  I saw him 1 month following the procedure in September 2018.  He had a CT scan at that time which showed good positioning of his stent graft.  He was having swelling in both lower extremities that he reported happened around the time of the surgery.  He did not have any evidence of hematoma or extrinsic compression in his groins.  I was unclear as to the cause of his swelling but difficult to relate this to his stent graft.  He was scheduled to see me back with repeat imaging and he elected to not follow-up.  He now undergoes a CT scan for evaluation of anemia and GI bleed.  His CT scan was reviewed and I discussed this with the patient and his family present.  This shows excellent positioning of the stent graft.  No change in the maximal diameter of 6 cm on a small persistent type II endoleak of no concern.  Again no evidence of any abnormalities in his pelvis to explain his swelling.  I have recommended that we see him again in 1 year with ultrasound follow-up.  He may or may not elect to continue follow-up

## 2018-03-31 NOTE — Transfer of Care (Signed)
Immediate Anesthesia Transfer of Care Note  Patient: Tyler Mcmillan  Procedure(s) Performed: ESOPHAGOGASTRODUODENOSCOPY (EGD) WITH PROPOFOL (N/A ) BIOPSY  Patient Location: Endoscopy Unit  Anesthesia Type:MAC  Level of Consciousness: awake, alert  and oriented  Airway & Oxygen Therapy: Patient Spontanous Breathing and Patient connected to nasal cannula oxygen  Post-op Assessment: Report given to RN and Post -op Vital signs reviewed and stable  Post vital signs: Reviewed and stable  Last Vitals:  Vitals Value Taken Time  BP    Temp    Pulse    Resp    SpO2      Last Pain:  Vitals:   03/31/18 0852  TempSrc: Oral  PainSc: 0-No pain         Complications: No apparent anesthesia complications

## 2018-03-31 NOTE — Progress Notes (Signed)
Pt arrived to unit via bed by Tech. Pt is A&OX4 oriented to room MAE well, and in NAD. Protonix drip infusing in RAC IV at 28ml/hr and NS infusing in LFA IV at 47ml/hrl. VS taken and in Epic. Pt placed on tele.

## 2018-03-31 NOTE — Progress Notes (Signed)
Initial Nutrition Assessment  DOCUMENTATION CODES:   Non-severe (moderate) malnutrition in context of chronic illness  INTERVENTION:  Ensure Enlive po BID, each supplement provides 350 kcal and 20 grams of protein, Vanilla   NUTRITION DIAGNOSIS:   Moderate Malnutrition related to chronic illness(Lung Cancer, possible Metastatic) as evidenced by percent weight loss, mild muscle depletion.   GOAL:   Patient will meet greater than or equal to 90% of their needs   MONITOR:   PO intake, Supplement acceptance, Weight trends, Labs, Diet advancement  REASON FOR ASSESSMENT:   Malnutrition Screening Tool(MST 2)    ASSESSMENT:   Pt is a 73y M with PMH of lung cancer, AAA (2018 Stent), HTN, anxiety, 6y ago- colonoscopy and endoscopy. Presented to ED with fatigue, weakness, dizziness, and dark stools for 1+months, worsening over the last week. Admitted now for Acute GI bleed. Symptomatic GI Bleed with Anemia , Chest x-ray showing metastatic foci in L Lung, L upper lobe with nodular lesion suspicious for metastasis.     2/21 EGD: 1 non-bleeding duodenal ulcer. Erythematous mucosa in the antrum, checking for H. Pylori. L Thoracentesis yielding 1.3L of amber fluid.    Pt reports appetite is fair to okay, but currently can't eat a lot on a full liquid diet. Recalls a typical diet to be "not balanced". Pt states for breakfast he eats a eggo pancake and eggs (with sugar and garlic). For lunch typically a cold cut/deli meat sandwich. He has in the past snacked on cheetos and trail mix. Doesn't give a a time frame for appetite history. Pt states that he only eats 2 meals a day. His wife cooks but isn't forthcoming about what she cooks or how often, but did state that "she doesn't cook like she used to."  Pt is agreeable to ensure, vanilla. Encouraged pt to drink ensure to get adequate nutrition since he is only consuming 2 meals a day regularly and especially while he is on full liquids diet.     Pt current weight is 171# (77.8kg) and states is UBW is between 185-190# 7.6-10.5% weight loss. He notes that he has lost weight, was unsure at first but states probably about 20# within the last 3 months. This is a 10.5% weight loss. Pt is near ideal weight at 166#(75.5kg).   Per chart, 6 years ago Pt seen gastroenterology and had colonoscopy and endoscopy. Colonoscopy was abnormal with colon polyps however Pt never followed up. Today Pt had had upper endoscopy procedure done today (2/21), per note, advance diet as tolerated.   Pt walks with a cane and endorses falling frequently and unable to move well.   Noted Pt has mild edema on ankles and feet. Also noted from chart, L Thoracentesis yielding 1.3L of amber fluid.      Labs reviewed.   Medications reviewed and include:  0.9% Sodium Chloride Infusion 75 mL/hr     NUTRITION - FOCUSED PHYSICAL EXAM:    Most Recent Value  Orbital Region  No depletion  Upper Arm Region  Mild depletion  Thoracic and Lumbar Region  No depletion  Buccal Region  No depletion  Temple Region  Mild depletion  Clavicle Bone Region  Mild depletion  Clavicle and Acromion Bone Region  Mild depletion  Scapular Bone Region  Mild depletion  Dorsal Hand  Mild depletion  Patellar Region  No depletion  Anterior Thigh Region  No depletion  Posterior Calf Region  No depletion  Edema (RD Assessment)  Mild [Feet/Ankels are Mildly swollen]  Hair  Reviewed  Eyes  Reviewed  Mouth  Reviewed  Skin  Reviewed  Nails  Reviewed       Diet Order:   Diet Order            Diet full liquid Room service appropriate? Yes; Fluid consistency: Thin  Diet effective now              EDUCATION NEEDS:   Education needs have been addressed  Skin:  Skin Assessment: Skin Integrity Issues: Skin Integrity Issues:: Stage I Stage I: Stage I on Sacrum: Intact skin with non-blanchable redness of a localized area usually over a bony prominence.   Last BM:  2/21  Height:    Ht Readings from Last 1 Encounters:  03/31/18 5\' 10"  (1.778 m)    Weight:   Wt Readings from Last 1 Encounters:  03/31/18 77.8 kg    Ideal Body Weight:  75.5 kg  BMI:  Body mass index is 24.61 kg/m.  Estimated Nutritional Needs:   Kcal:  2000-2300  Protein:  100-115 grams  Fluid:  >2L    Herma Carson, [facility]

## 2018-03-31 NOTE — Consult Note (Signed)
Subjective:   HPI  The patient is a 73 year old male with a history of lung cancer,AAA, and anxiety who came to the emergency room with complaints of fatigue and weakness and dark stools which he had been having for several months but worse over the past week.  His stools were found to be heme positive and his hemoglobin was low at 9.2.  He denies abdominal pain or heartburn.  He does give a history of having EGD about 6 years ago showing gastritis and also a history of a colonoscopy in the past with colon polyps.  His last colonoscopy was about 6 years ago.  He denies hematemesis.  He was admitted to the hospital because of ongoing melena and heme positive stool and anemia.  Review of Systems Denies chest pain or shortness of breath  Past Medical History:  Diagnosis Date  . AAA (abdominal aortic aneurysm) without rupture ([place])    [place] ABD.  DONE  JUNE 2014  3.5  . Bladder cancer ([place])   . Bladder neoplasm   . Chronic anxiety   . GERD (gastroesophageal reflux disease)   . Hypertension   . 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 ABDOMINAL AORTIC ENDOVASCULAR STENT GRAFT N/A 09/08/2016   Procedure: ABDOMINAL AORTIC ENDOVASCULAR STENT GRAFT;  Surgeon: [name], MD;  Location: [facility];  Service: Vascular;  Laterality: N/A;  . CYSTOSCOPY W/ URETERAL STENT PLACEMENT Left 10/04/2012   Procedure: CYSTOSCOPY WITH RETROGRADE PYELOGRAM/URETERAL STENT PLACEMENT   "POSSIBLE LEFT STENT";  Surgeon: Alexis Frock, MD;  Location[facility];  Service: Urology;  Laterality: Left;  . LEFT HEART CATH AND CORONARY ANGIOGRAPHY N/A 07/02/2016   Procedure: Left Heart Cath and Coronary Angiography;  Surgeon: Martinique, Peter M, MD;  Location: [facility];  Service: Cardiovascular;  Laterality: N/A;  . MEATOTOMY N/A 10/04/2012   Procedure: MEATOTOMY ADULT;  Surgeon: Alexis Frock, MD;  Location: [facility];   Service: Urology;  Laterality: N/A;  . TRANSURETHRAL RESECTION OF BLADDER TUMOR WITH GYRUS (TURBT-GYRUS) N/A 10/04/2012   Procedure: TRANSURETHRAL RESECTION OF BLADDER TUMOR WITH GYRUS (TURBT-GYRUS);  Surgeon: Alexis Frock, MD;  Location: [facility];  Service: Urology;  Laterality: N/A;  . VIDEO BRONCHOSCOPY Bilateral 07/19/2016   Procedure: VIDEO BRONCHOSCOPY WITH FLUORO;  Surgeon: Marshell Garfinkel, MD;  Location: [facility];  Service: Cardiopulmonary;  Laterality: Bilateral;   Social History   Socioeconomic History  . Marital status: Married    Spouse name: Not on file  . Number of children: Not on file  . Years of education: Not on file  . Highest education level: Not on file  Occupational History  . Not on file  Social Needs  . Financial resource strain: Not on file  . Food insecurity:    Worry: Not on file    Inability: Not on file  . Transportation needs:    Medical: Not on file    Non-medical: Not on file  Tobacco Use  . Smoking status: Current Every Day Smoker    Packs/day: 0.50    Years: 50.00    Pack years: 25.00    Types: Cigarettes  . Smokeless tobacco: Never Used  . Tobacco comment: pt refuses info  Substance and Sexual Activity  . Alcohol use: Yes    Alcohol/week: 24.0 standard drinks    Types: 10 Glasses of wine, 14 Cans of beer per week  Comment: few glases wine per day, beer  . Drug use: No  . Sexual activity: Not on file  Lifestyle  . Physical activity:    Days per week: Not on file    Minutes per session: Not on file  . Stress: Not on file  Relationships  . Social connections:    Talks on phone: Not on file    Gets together: Not on file    Attends religious service: Not on file    Active member of club or organization: Not on file    Attends meetings of clubs or organizations: Not on file    Relationship status: Not on file  . Intimate partner violence:    Fear of current or ex partner: Not on file    Emotionally abused: Not  on file    Physically abused: Not on file    Forced sexual activity: Not on file  Other Topics Concern  . Not on file  Social History Narrative  . Not on file   family history includes Cancer in his brother and mother; Heart attack in his father; Heart disease in his father.  Current Facility-Administered Medications:  .  [MAR Hold] 0.9 %  sodium chloride infusion (Manually program via Guardrails IV Fluids), , Intravenous, Once, Cristal Ford, DO .  0.9 %  sodium chloride infusion, , Intravenous, Continuous, Cristal Ford, DO, Last Rate: 75 mL/hr at 03/30/18 1845, 75 mL/hr at 03/30/18 1845 .  [MAR Hold] acetaminophen (TYLENOL) tablet 650 mg, 650 mg, Oral, Q6H PRN **OR** [MAR Hold] acetaminophen (TYLENOL) suppository 650 mg, 650 mg, Rectal, Q6H PRN, Mikhail, Maryann, DO .  lactated ringers infusion, , , Continuous PRN, Anson Fret F, MD, 1,000 mL at 03/31/18 0856 .  [MAR Hold] LORazepam (ATIVAN) injection 0.5 mg, 0.5 mg, Intravenous, Q6H PRN, Cristal Ford, DO, 0.5 mg at 03/31/18 4166 .  [MAR Hold] morphine 2 MG/ML injection 2 mg, 2 mg, Intravenous, Q4H PRN, Cristal Ford, DO, 2 mg at 03/31/18 0630 .  [MAR Hold] nicotine (NICODERM CQ - dosed in mg/24 hours) patch 14 mg, 14 mg, Transdermal, Daily, Cristal Ford, DO, 14 mg at 03/31/18 0301 .  [MAR Hold] ondansetron (ZOFRAN) tablet 4 mg, 4 mg, Oral, Q6H PRN **OR** [MAR Hold] ondansetron (ZOFRAN) injection 4 mg, 4 mg, Intravenous, Q6H PRN, Mikhail, Maryann, DO .  pantoprazole (PROTONIX) 80 mg in sodium chloride 0.9 % 250 mL (0.32 mg/mL) infusion, 8 mg/hr, Intravenous, Continuous, Isla Pence, MD, Last Rate: 25 mL/hr at 03/30/18 1747, 8 mg/hr at 03/30/18 1747 No Known Allergies   Objective:     BP (!) 146/80   Pulse 92   Temp 98 F (36.7 C) (Oral)   Resp 20   Ht 5\' 10"  (1.778 m)   Wt 77.8 kg   SpO2 97%   BMI 24.61 kg/m   No acute distress  Heart regular rhythm no murmurs  Lungs clear  Abdomen: Bowel sounds  present, soft, nontender, no hepatosplenomegaly  Laboratory No components found for: D1    Assessment:     Melena  Anemia  Multiple other medical problems as mentioned above      Plan:     We will plan EGD to investigate melena today.  He should have a colonoscopy at some point for a follow-up. Lab Results  Component Value Date   HGB 9.7 (L) 03/31/2018   HGB 9.2 (L) 03/30/2018   HGB 16.3 11/20/2016   HGB 16.0 08/09/2016   HCT 30.5 (L) 03/31/2018   HCT  29.9 (L) 03/30/2018   HCT 48.0 11/20/2016   HCT 47.1 08/09/2016   ALKPHOS 115 03/30/2018   ALKPHOS 89 09/06/2016   ALKPHOS 105 08/09/2016   AST 60 (H) 03/30/2018   AST 84 (H) 09/06/2016   AST 85 (H) 08/09/2016   ALT 36 03/30/2018   ALT 33 09/06/2016   ALT 45 08/09/2016

## 2018-03-31 NOTE — Procedures (Signed)
PROCEDURE SUMMARY:  Successful US guided diagnostic and therapeutic left thoracentesis. Yielded 1.3 liters of amber fluid. Pt tolerated procedure well. No immediate complications.  Specimen was sent for labs. CXR ordered.  EBL < 5 mL  Docia Barrier PA-C 03/31/2018 12:22 PM

## 2018-03-31 NOTE — Plan of Care (Signed)

## 2018-03-31 NOTE — Anesthesia Postprocedure Evaluation (Signed)
Anesthesia Post Note  Patient: Tyler Mcmillan  Procedure(s) Performed: ESOPHAGOGASTRODUODENOSCOPY (EGD) WITH PROPOFOL (N/A ) BIOPSY     Patient location during evaluation: PACU Anesthesia Type: MAC Level of consciousness: awake and alert Pain management: pain level controlled Vital Signs Assessment: post-procedure vital signs reviewed and stable Respiratory status: spontaneous breathing Cardiovascular status: stable Anesthetic complications: no    Last Vitals:  Vitals:   03/31/18 1156 03/31/18 1216  BP: 132/71 105/63  Pulse:    Resp:    Temp:    SpO2:      Last Pain:  Vitals:   03/31/18 1154  TempSrc:   PainSc: [place]

## 2018-03-31 NOTE — Anesthesia Preprocedure Evaluation (Signed)
Anesthesia Evaluation  Patient identified by MRN, date of birth, ID band Patient awake    Reviewed: Allergy & Precautions, NPO status , Patient's Chart, lab work & pertinent test results  Airway Mallampati: IV  TM Distance: >3 FB Neck ROM: Full    Dental  (+) Teeth Intact, Dental Advisory Given   Pulmonary neg pulmonary ROS, Current Smoker,     + decreased breath sounds      Cardiovascular hypertension, Pt. on medications and Pt. on home beta blockers + angina + Peripheral Vascular Disease   Rhythm:Regular Rate:Normal     Neuro/Psych Anxiety negative neurological ROS     GI/Hepatic Neg liver ROS, GERD  ,  Endo/Other  negative endocrine ROS  Renal/GU negative Renal ROS     Musculoskeletal  (+) Arthritis ,   Abdominal   Peds  Hematology negative hematology ROS (+)   Anesthesia Other Findings Day of surgery medications reviewed with the patient.  Reproductive/Obstetrics                             Anesthesia Physical Anesthesia Plan  ASA: III  Anesthesia Plan: MAC   Post-op Pain Management:    Induction: Intravenous  PONV Risk Score and Plan: 0 and Propofol infusion and TIVA  Airway Management Planned: Natural Airway  Additional Equipment:   Intra-op Plan:   Post-operative Plan:   Informed Consent: I have reviewed the patients History and Physical, chart, labs and discussed the procedure including the risks, benefits and alternatives for the proposed anesthesia with the patient or authorized representative who has indicated his/her understanding and acceptance.     Dental advisory given  Plan Discussed with: CRNA  Anesthesia Plan Comments:         Anesthesia Quick Evaluation

## 2018-04-01 LAB — CBC
HCT: 28.5 % — ABNORMAL LOW (ref 39.0–52.0)
HEMOGLOBIN: 9.1 g/dL — AB (ref 13.0–17.0)
MCH: 34.6 pg — ABNORMAL HIGH (ref 26.0–34.0)
MCHC: 31.9 g/dL (ref 30.0–36.0)
MCV: 108.4 fL — ABNORMAL HIGH (ref 80.0–100.0)
Platelets: 263 10*3/uL (ref 150–400)
RBC: 2.63 MIL/uL — ABNORMAL LOW (ref 4.22–5.81)
RDW: 15.7 % — ABNORMAL HIGH (ref 11.5–15.5)
WBC: 4.7 10*3/uL (ref 4.0–10.5)
nRBC: 0 % (ref 0.0–0.2)

## 2018-04-01 MED ORDER — OXYCODONE HCL 5 MG PO TABS
5.0000 mg | ORAL_TABLET | ORAL | Status: DC | PRN
  Administered 2018-04-01 – 2018-04-10 (×28): 5 mg via ORAL
  Filled 2018-04-01 (×29): qty 1

## 2018-04-01 NOTE — Evaluation (Addendum)
Occupational Therapy Evaluation Patient Details Name: Tyler Mcmillan MRN: [id number] DOB: Jul 29, 1945 Today's Date: 04/01/2018    History of Present Illness Pt is a 73 y/o male with a PMH significant for lung CA, AAA, anxiety who presents s/p fatigue, weakness, dizziness, dark stools and frequent falls for several months, acutely worsening in the last week. Pt has undergone thoracentesis and EGD.    Clinical Impression   PTA patient independent and driving.  Admitted for above and limited by problem list below, including impaired balance, generalized weakness, and decreased activity tolerance.  Patient currently requires min assist for basic transfers, min guard for mobility and min assist for LB ADLs. He presents with poor safety awareness throughout session, cueing for walker mgmt.  Educated on safety, precautions, and recommendations. He will benefit from continued OT services while admitted and after dc at [facility] level in order to optimize independence and safety upon dc home.    Follow Up Recommendations  Home health OT;Supervision/Assistance - 24 hour    Equipment Recommendations  3 in 1 bedside commode    Recommendations for Other Services       Precautions / Restrictions Precautions Precautions: Fall Restrictions Weight Bearing Restrictions: No      Mobility Bed Mobility Overal bed mobility: Modified Independent             General bed mobility comments: no assist required  Transfers Overall transfer level: Needs assistance Equipment used: Rolling walker (2 wheeled) Transfers: Sit to/from Stand Sit to Stand: Min assist         General transfer comment: min assist for safety and balance, cueing for hand placement and sequencing     Balance Overall balance assessment: Needs assistance Sitting-balance support: Feet supported;No upper extremity supported Sitting balance-Leahy Scale: Fair     Standing balance support: No upper extremity supported;During  functional activity Standing balance-Leahy Scale: Poor Standing balance comment: min guard unsupported at sink, reliant on UE support                           ADL either performed or assessed with clinical judgement   ADL Overall ADL's : Needs assistance/impaired     Grooming: Wash/dry hands;Min guard;Standing   Upper Body Bathing: Set up;Sitting   Lower Body Bathing: Minimal assistance;Sit to/from stand Lower Body Bathing Details (indicate cue type and reason): min assist in standing  Upper Body Dressing : Set up;Sitting   Lower Body Dressing: Minimal assistance;Sit to/from stand Lower Body Dressing Details (indicate cue type and reason): min assist to don R sock, min assist in standing  Toilet Transfer: Minimal assistance;Ambulation;RW Toilet Transfer Details (indicate cue type and reason): simulated to recliner, cueing for hand placement and safety          Functional mobility during ADLs: Min guard;Rolling walker General ADL Comments: poor safety awareness and balance      Vision Baseline Vision/History: Wears glasses Wears Glasses: At all times Vision Assessment?: No apparent visual deficits     Perception     Praxis      Pertinent Vitals/Pain       Hand Dominance Left   Extremity/Trunk Assessment Upper Extremity Assessment Upper Extremity Assessment: Generalized weakness   Lower Extremity Assessment Lower Extremity Assessment: Defer to PT evaluation   Cervical / Trunk Assessment Cervical / Trunk Assessment: Other exceptions Cervical / Trunk Exceptions: Forward head posture with rounded shoulders   Communication Communication Communication: No difficulties   Cognition Arousal/Alertness: Awake/alert Behavior During  Therapy: WFL for tasks assessed/performed Overall Cognitive Status: Impaired/Different from baseline Area of Impairment: Following commands;Safety/judgement;Attention;Problem solving;Awareness                   Current  Attention Level: Selective   Following Commands: Follows one step commands with increased time Safety/Judgement: Decreased awareness of safety;Decreased awareness of deficits Awareness: Emergent Problem Solving: Requires verbal cues     General Comments       Exercises     Shoulder Instructions      Home Living Family/patient expects to be discharged to:: Private residence Living Arrangements: Spouse/significant other;Children Available Help at Discharge: Family;Available 24 hours/day Type of Home: House Home Access: Stairs to enter [employer] of Steps: 2-3   Home Layout: One level     Bathroom Shower/Tub: Tub/shower unit;Walk-in shower   Bathroom Toilet: Standard     Home Equipment: Environmental consultant - 2 wheels;Cane - single point   Additional Comments: reports using tub shower      Prior Functioning/Environment Level of Independence: Needs assistance  Gait / Transfers Assistance Needed: Has been using the walker lately - falling frequently ADL's / Homemaking Assistance Needed: independent ADLs, shares IADLs with spouse, independent medication mgmt and fiances     Comments: drives         OT Problem List: Decreased strength;Decreased activity tolerance;Impaired balance (sitting and/or standing);Decreased safety awareness;Decreased knowledge of use of DME or AE;Decreased knowledge of precautions      OT Treatment/Interventions: Self-care/ADL training;Therapeutic exercise;Energy conservation;DME and/or AE instruction;Therapeutic activities;Patient/family education;Balance training    OT Goals(Current goals can be found in the care plan section) Acute Rehab OT Goals Patient Stated Goal: to get better OT Goal Formulation: With patient Time For Goal Achievement: 04/15/18 Potential to Achieve Goals: Good  OT Frequency: Min 2X/week   Barriers to D/C:            Co-evaluation              AM-PAC OT "6 Clicks" Daily Activity     Outcome Measure Help  from another person eating meals?: None Help from another person taking care of personal grooming?: A Little Help from another person toileting, which includes using toliet, bedpan, or urinal?: A Little Help from another person bathing (including washing, rinsing, drying)?: A Little Help from another person to put on and taking off regular upper body clothing?: None Help from another person to put on and taking off regular lower body clothing?: A Lot 6 Click Score: 19   End of Session Equipment Utilized During Treatment: Rolling walker Nurse Communication: Mobility status;Precautions  Activity Tolerance: Patient tolerated treatment well Patient left: in chair;with call bell/phone within reach;with chair alarm set  OT Visit Diagnosis: Repeated falls (R29.6);Muscle weakness (generalized) (M62.81);Unsteadiness on feet (R26.81)                Time: 6213-0865 OT Time Calculation (min): 21 min Charges:  OT General Charges $OT Visit: 1 Visit OT Evaluation $OT Eval Moderate Complexity: [place], OT Acute Rehabilitation Services Pager [phone] Office [phone]   Delight Stare 04/01/2018, 12:53 PM

## 2018-04-01 NOTE — Progress Notes (Signed)
PROGRESS NOTE    Tyler Mcmillan  EXH:[id number] DOB: [birth year] DOA: 03/30/2018 PCP: [name], MD   Brief Narrative:  HPI on 03/30/2018  Tyler Mcmillan is a 73 y.o. male with a medical history of lung cancer, AAA, anxiety, who presented to the emergency department with complaints of fatigue, weakness, dizziness, dark stools, for several months however worsened in the past week.  Patient has not been on any blood thinners but endorses using Advil for pain.  Patient has seen gastroenterology in the past approximately 6 years ago and had a colonoscopy and endoscopy at that time.  Patient does state that his colonoscopy was abnormal however he never followed up.  Patient was also noted to have a AAA with stent placement by Dr. Donnetta Hutching back in 2018, and was supposed to have a CTA recently however was unable to make it to that appointment.  Endorses falling frequently and unable to move well.  His son is at bedside and also states the same.  Complains of having lower extremity swelling which is been ongoing since 2018 since his stent placement.  He denies current chest pain, shortness of breath, abdominal pain, nausea or vomiting, diarrhea or constipation, painful urination, headache, recent illness or travel.  Interim history Patient admitted with symptomatic anemia.  Gastroenterology consulted and appreciated, patient did have EGD showing nonbleeding ulcer.  Patient does appear to be weak and needing assistance.  PT recommending home health.  Pending OT evaluation.  Assessment & Plan   Symptomatic anemia/GI bleed/acute blood loss anemia -Hemoglobin on admission 9.2, however back in 2018, hemoglobin was 16 -FOBT positive -Patient presenting with fatigue, black tarry stools, shortness of breath -Patient endorses using Advil -Gastroenterology consulted and appreciated -was placed on Protonix drip- transitioned to oral PPI BID -s/p EGD: Normal esophagus, erythematous mucosa in the antrum, biopsied.  1  nonbleeding duodenal ulcer with no stigmata of bleeding.  Recommended PPI twice daily.  Home tomorrow if stable.  GI ollow-up on H. pylori biopsy.  Outpatient colonoscopy -was transfused 1u on admission -Currently hemoglobin 9.1 -Will transfuse 1u PRBC today given his tachycardia and continued weakness -Continue to monitor  Falls -Patient has been falling lately, likely secondary to anemia versus cancer -PT an OT consulted -PT's assessment stated: "Pt with overall poor safety awareness and will require staff assist if OOB. Pt will benefit from skilled PT to increase their independence and safety"- recommended HH PT/ Supervision/assistance 24hr, 3in1 -Pending OT assessment -Personally given patient's noted weakness and need for moderate assistance with standing, feel patient would benefit from SNF placement.  Will discuss with social work. -Of note, while he was in the room with the patient, he was unable to stand and urinated on himself as well as the floor.  Chronic pain -Continue morphine and oxycodone PRN  Anxiety -Continue low-dose Ativan  History of lung cancer with pleural effusion -Chest x-ray reviewed, showing metastatic foci in left lung.  Well-defined nodular lesion in the anterior segment left upper lobe, suspicious for metastasis.  Large left pleural effusion -Patient was seen in Dr. Earlie Server has had radiation in the past -CTA showed large left-sided pleural effusion.  Low-density lesion in the left upper lobe at the site of prior cavitary mass further decreased in size.  No other suspicious lung nodules seen. -Status post ultrasound-guided thoracentesis yielding 1.3 L of amber fluid -Pending fluid cytology and Gram stain  Acute dyspnea -Possibly secondary to anemia versus pleural effusion -Treatment plan as above  History of AAA -Stent graft repair  infrarenal abdominal aortic aneurysm, 09/08/2016  -Patient was to have repeat CTA of the abdomen pelvis however was too  weak to make it to that appointment -CTA A/P as below -Discussed with Dr. Donnetta Hutching, vascular surgery- consulted and appreciated.  Patient with stable type II endoleak.  He is to follow-up with Dr. Donnetta Hutching in 1 year.  Tobacco abuse -no intentions of quitting -Cotninue nicotine patch  CODE STATUS -Discussed with patient and son, currently DNI.  DVT Prophylaxis  SCDs  Code Status: DNI  Family Communication: None at bedside  Disposition Plan: TBD.  Given patient's continued weakness, feel that he would benefit from SNF placement.  Will discuss with social work if possible.  Will also transfuse additional unit PRBC given continued weakness and tachycardia.  Consultants Gastroenterology Vascular surgery  Procedures  EGD  Antibiotics   Anti-infectives (From admission, onward)   None      Subjective:   Tyler Mcmillan seen and examined today.  He is to feel weak although better than previous days.  States he is unable to get up to go to the bathroom on his own and did have an accident this morning.  Patient also inquires about having further tests done while he is in the hospital.  Also concerned about his lower extremity edema which is been present since 2018.  Denies current chest pain, abdominal pain, nausea or vomiting, diarrhea or constipation.  Objective:   Vitals:   03/31/18 1555 03/31/18 1747 03/31/18 2014 04/01/18 0420  BP: 127/61 (!) 146/76 136/86 137/81  Pulse: 93 92 90 (!) 111  Resp:  18 19 19   Temp:  98 F (36.7 C) 98 F (36.7 C) 98.1 F (36.7 C)  TempSrc:  Oral Oral Oral  SpO2: 96% 97% 93% 94%  Weight:   78.1 kg   Height:        Intake/Output Summary (Last 24 hours) at 04/01/2018 1021 Last data filed at 04/01/2018 0930 Gross per 24 hour  Intake 3379.34 ml  Output 1425 ml  Net 1954.34 ml   Filed Weights   03/30/18 1950 03/31/18 0852 03/31/18 2014  Weight: 77.8 kg 77.8 kg 78.1 kg   Exam  General: Well developed, chronically ill appearing, NAD  HEENT:  NCAT, mucous membranes moist.   Neck: Supple  Cardiovascular: S1 S2 auscultated, RRR  Respiratory: Clear to auscultation bilaterally with equal chest rise  Abdomen: Soft, nontender, nondistended, + bowel sounds  Extremities: warm dry without cyanosis clubbing. +++LE edema B/L   Neuro: AAOx3, nonfocal  Psych: Anxious, however appropriate  Data Reviewed: I have personally reviewed following labs and imaging studies  CBC: Recent Labs  Lab 03/30/18 1303 03/31/18 0519 04/01/18 0407  WBC 8.4 4.8 4.7  HGB 9.2* 9.7* 9.1*  HCT 29.9* 30.5* 28.5*  MCV 112.0* 107.4* 108.4*  PLT 302 243 621   Basic Metabolic Panel: Recent Labs  Lab 03/30/18 1303 03/31/18 0519  NA 137 138  K 4.7 3.8  CL 104 106  CO2 22 23  GLUCOSE 130* 87  BUN 15 8  CREATININE 1.02 0.90  CALCIUM 8.7* 8.2*   GFR: Estimated Creatinine Clearance: 76.6 mL/min (by C-G formula based on SCr of 0.9 mg/dL). Liver Function Tests: Recent Labs  Lab 03/30/18 1303  AST 60*  ALT 36  ALKPHOS 115  BILITOT 0.2*  PROT 6.4*  ALBUMIN 2.6*   No results for input(s): LIPASE, AMYLASE in the last 168 hours. No results for input(s): AMMONIA in the last 168 hours. Coagulation Profile: No results for  input(s): INR, PROTIME in the last 168 hours. Cardiac Enzymes: Recent Labs  Lab 03/30/18 1531  TROPONINI <0.03   BNP (last 3 results) No results for input(s): PROBNP in the last 8760 hours. HbA1C: No results for input(s): HGBA1C in the last 72 hours. CBG: No results for input(s): GLUCAP in the last 168 hours. Lipid Profile: No results for input(s): CHOL, HDL, LDLCALC, TRIG, CHOLHDL, LDLDIRECT in the last 72 hours. Thyroid Function Tests: No results for input(s): TSH, T4TOTAL, FREET4, T3FREE, THYROIDAB in the last 72 hours. Anemia Panel: No results for input(s): VITAMINB12, FOLATE, FERRITIN, TIBC, IRON, RETICCTPCT in the last 72 hours. Urine analysis:    Component Value Date/Time   COLORURINE YELLOW 03/31/2018 0630    APPEARANCEUR CLEAR 03/31/2018 0630   LABSPEC 1.042 (H) 03/31/2018 0630   PHURINE 5.0 03/31/2018 0630   GLUCOSEU NEGATIVE 03/31/2018 0630   HGBUR SMALL (A) 03/31/2018 0630   BILIRUBINUR NEGATIVE 03/31/2018 0630   KETONESUR NEGATIVE 03/31/2018 0630   PROTEINUR NEGATIVE 03/31/2018 0630   NITRITE NEGATIVE 03/31/2018 0630   LEUKOCYTESUR NEGATIVE 03/31/2018 0630   Sepsis Labs: @LABRCNTIP (procalcitonin:4,lacticidven:4)  ) Recent Results (from the past 240 hour(s))  Gram stain     Status: None   Collection Time: 03/31/18 12:26 PM  Result Value Ref Range Status   Specimen Description FLUID PLEURAL LEFT  Final   Special Requests NONE  Final   Gram Stain   Final    FEW WBC PRESENT, PREDOMINANTLY MONONUCLEAR NO ORGANISMS SEEN Performed at [facility], [place] [address], [place], [place] [address]    Report Status 03/31/2018 FINAL  Final      Radiology Studies: Dg Chest 1 View  Result Date: 03/31/2018 CLINICAL DATA:  Left thoracentesis. EXAM: CHEST  1 VIEW COMPARISON:  CT chest and chest x-ray from yesterday. FINDINGS: The left heart border remains obscured. Slight interval decrease in size of the moderate left pleural effusion. Relatively unchanged atelectasis in the left lower greater than upper lobes. Unchanged 3.4 cm mass in the left upper lobe. The right lung remains clear. No pneumothorax. No acute osseous abnormality. IMPRESSION: 1. Slight interval decrease in size of moderate left pleural effusion status post thoracentesis. No pneumothorax. 2. Relatively unchanged left lung atelectasis. 3. Unchanged 3.4 cm left upper lobe mass. Electronically Signed   By: Titus Dubin M.D.   On: 03/31/2018 12:42   Dg Chest 2 View  Result Date: 03/30/2018 CLINICAL DATA:  Fatigue and weakness. Reported history of lung carcinoma EXAM: CHEST - 2 VIEW COMPARISON:  May 16, 2017 FINDINGS: There is a sizable left pleural effusion. There is a nodular opacity in the anterior segment of the left upper  lobe measuring 3.2 x 3.1 x 3.4 cm. There are suspected nodular lesions partially obscured by the effusion in the left perihilar region. These areas are concerning for metastases. Right lung is clear. The heart size and pulmonary vascularity are normal. No adenopathy is appreciable by radiography. There is aortic atherosclerosis. There are foci of carotid artery calcification. There are wedge fractures in the midthoracic region. There are no blastic or lytic bone lesions. IMPRESSION: Suspect metastatic foci in left lung. There is a well-defined nodular lesion in the anterior segment left upper lobe which is certainly suspicious for metastasis. There is a large left pleural effusion. Right lung is clear. Cardiac silhouette is within normal limits. Aortic Atherosclerosis (ICD10-I70.0). There are foci of carotid artery calcification. Given concern for metastatic disease, chest CT, ideally with intravenous contrast, may be advisable. Electronically  Signed   By: Lowella Grip III M.D.   On: 03/30/2018 16:16   Ct Angio Chest Aorta W/cm &/or Wo/cm  Result Date: 03/30/2018 CLINICAL DATA:  Abnormal chest x-ray history of aortic aneurysm repair EXAM: CT ANGIOGRAPHY CHEST, ABDOMEN AND PELVIS TECHNIQUE: Multidetector CT imaging through the chest, abdomen and pelvis was performed using the standard protocol during bolus administration of intravenous contrast. Multiplanar reconstructed images and MIPs were obtained and reviewed to evaluate the vascular anatomy. CONTRAST:  163mL ISOVUE-370 IOPAMIDOL (ISOVUE-370) INJECTION 76% COMPARISON:  Radiograph 03/30/2018, 05/16/2017, CT chest 04/21/2017, 12/08/2016, CT angiogram 10/19/2016 FINDINGS: CTA CHEST FINDINGS Cardiovascular: Non contrasted images of the chest demonstrate moderate aortic atherosclerosis. No aneurysmal dilatation. Coronary vascular calcification. Normal heart size. Trace pericardial effusion. Following contrast, no dissection is seen. Central pulmonary arteries  are patent. Mediastinum/Nodes: Midline trachea. No thyroid mass. No significant adenopathy. Esophagus within normal limits. Lungs/Pleura: Large left-sided pleural effusion. Moderate emphysema. Right lung is clear. Partial atelectasis in the left lower lobe. Cystic density, corresponding to radiographic abnormality in the left upper lobe, this measures 2.9 x 2.9 cm, previously 3.1 x 3.4 cm and is in the region of the previously demonstrated cavitary lesion. Scarring in the left upper lobe. No other suspicious pulmonary nodules. Musculoskeletal: No acute acute or suspicious abnormality. Review of the MIP images confirms the above findings. CTA ABDOMEN AND PELVIS FINDINGS VASCULAR Aorta: Moderate aortic atherosclerosis. Status post endovascular repair of infrarenal abdominal aortic aneurysm just below the renal arteries to the bilateral common iliac arteries. There is graft patency. Residual aneurysm sac measures 5.8 x 6 cm as compared with 5.7 x 6 cm previously. Small enhancing vessels are again noted within the aneurysm sac. Celiac: Calcification at the origin without significant stenosis. SMA: Mild stenosis at the origin. Renals: Single right renal artery and 2 left renal arteries including a small accessory artery to the upper pole. Moderate focal stenosis at the origin of the dominant left renal artery as before. IMA: Not well opacified at the origin. Inflow: Both internal iliac arteries are patent. Atherosclerotic disease of the external iliac arteries. Veins: No obvious venous abnormality within the limitations of this arterial phase study. Review of the MIP images confirms the above findings. NON-VASCULAR Hepatobiliary: Slightly nodular liver contour, possible cirrhosis. Calcified stone at the gallbladder neck. No biliary dilatation Pancreas: Unremarkable. No pancreatic ductal dilatation or surrounding inflammatory changes. Spleen: Within normal limits. Adrenals/Urinary Tract: Adrenal glands are unremarkable.  Kidneys are normal, without renal calculi, focal lesion, or hydronephrosis. Bladder is unremarkable. Stomach/Bowel: The stomach is within normal limits. No dilated small bowel. No colon wall thickening. Lymphatic: No significantly enlarged lymph nodes. Reproductive: Slightly heterogeneous enhancement of the prostate with calcifications. Other: No free air. Small free fluid in the pelvis. Fat containing right greater than left inguinal hernia with possible small fluid in the right inguinal region. 2.4 cm subcutaneous cyst in the right gluteal region. Musculoskeletal: Acute to subacute fracture involving the L1 vertebral body with close to 50% loss of height anteriorly. No significant retropulsion. Trace anterolisthesis L4 on L5. Review of the MIP images confirms the above findings. IMPRESSION: 1. Status post endovascular repair of distal infrarenal abdominal aortic aneurysm. The excluded aneurysm sac is unchanged in size as compared with 10/19/2016 and measures 5.8 x 6 cm. Arterial phase enhancement is again noted within the excluded sac, consistent with type 2 endoleak. 2. Large left-sided pleural effusion. Low-density lesion in the left upper lobe at the site of prior cavitary mass has further decreased  in size and is felt to correspond to the radiographic nodule. No other suspicious lung nodules are seen. 3. Slightly nodular liver contour, possible cirrhosis 4. Gallstone 5. Heterogeneous enhancement of the prostate gland with calcification. 6. Small free fluid in the pelvis 7. Acute to subacute fracture involving L1 vertebral body with close to 50% loss of height anteriorly 8. No definite CT evidence for metastatic disease to the abdomen or pelvis Electronically Signed   By: Donavan Foil M.D.   On: 03/30/2018 23:06   Ct Angio Abd/pel W/ And/or W/o  Result Date: 03/30/2018 CLINICAL DATA:  Abnormal chest x-ray history of aortic aneurysm repair EXAM: CT ANGIOGRAPHY CHEST, ABDOMEN AND PELVIS TECHNIQUE:  Multidetector CT imaging through the chest, abdomen and pelvis was performed using the standard protocol during bolus administration of intravenous contrast. Multiplanar reconstructed images and MIPs were obtained and reviewed to evaluate the vascular anatomy. CONTRAST:  131mL ISOVUE-370 IOPAMIDOL (ISOVUE-370) INJECTION 76% COMPARISON:  Radiograph 03/30/2018, 05/16/2017, CT chest 04/21/2017, 12/08/2016, CT angiogram 10/19/2016 FINDINGS: CTA CHEST FINDINGS Cardiovascular: Non contrasted images of the chest demonstrate moderate aortic atherosclerosis. No aneurysmal dilatation. Coronary vascular calcification. Normal heart size. Trace pericardial effusion. Following contrast, no dissection is seen. Central pulmonary arteries are patent. Mediastinum/Nodes: Midline trachea. No thyroid mass. No significant adenopathy. Esophagus within normal limits. Lungs/Pleura: Large left-sided pleural effusion. Moderate emphysema. Right lung is clear. Partial atelectasis in the left lower lobe. Cystic density, corresponding to radiographic abnormality in the left upper lobe, this measures 2.9 x 2.9 cm, previously 3.1 x 3.4 cm and is in the region of the previously demonstrated cavitary lesion. Scarring in the left upper lobe. No other suspicious pulmonary nodules. Musculoskeletal: No acute acute or suspicious abnormality. Review of the MIP images confirms the above findings. CTA ABDOMEN AND PELVIS FINDINGS VASCULAR Aorta: Moderate aortic atherosclerosis. Status post endovascular repair of infrarenal abdominal aortic aneurysm just below the renal arteries to the bilateral common iliac arteries. There is graft patency. Residual aneurysm sac measures 5.8 x 6 cm as compared with 5.7 x 6 cm previously. Small enhancing vessels are again noted within the aneurysm sac. Celiac: Calcification at the origin without significant stenosis. SMA: Mild stenosis at the origin. Renals: Single right renal artery and 2 left renal arteries including a small  accessory artery to the upper pole. Moderate focal stenosis at the origin of the dominant left renal artery as before. IMA: Not well opacified at the origin. Inflow: Both internal iliac arteries are patent. Atherosclerotic disease of the external iliac arteries. Veins: No obvious venous abnormality within the limitations of this arterial phase study. Review of the MIP images confirms the above findings. NON-VASCULAR Hepatobiliary: Slightly nodular liver contour, possible cirrhosis. Calcified stone at the gallbladder neck. No biliary dilatation Pancreas: Unremarkable. No pancreatic ductal dilatation or surrounding inflammatory changes. Spleen: Within normal limits. Adrenals/Urinary Tract: Adrenal glands are unremarkable. Kidneys are normal, without renal calculi, focal lesion, or hydronephrosis. Bladder is unremarkable. Stomach/Bowel: The stomach is within normal limits. No dilated small bowel. No colon wall thickening. Lymphatic: No significantly enlarged lymph nodes. Reproductive: Slightly heterogeneous enhancement of the prostate with calcifications. Other: No free air. Small free fluid in the pelvis. Fat containing right greater than left inguinal hernia with possible small fluid in the right inguinal region. 2.4 cm subcutaneous cyst in the right gluteal region. Musculoskeletal: Acute to subacute fracture involving the L1 vertebral body with close to 50% loss of height anteriorly. No significant retropulsion. Trace anterolisthesis L4 on L5. Review of the MIP  images confirms the above findings. IMPRESSION: 1. Status post endovascular repair of distal infrarenal abdominal aortic aneurysm. The excluded aneurysm sac is unchanged in size as compared with 10/19/2016 and measures 5.8 x 6 cm. Arterial phase enhancement is again noted within the excluded sac, consistent with type 2 endoleak. 2. Large left-sided pleural effusion. Low-density lesion in the left upper lobe at the site of prior cavitary mass has further  decreased in size and is felt to correspond to the radiographic nodule. No other suspicious lung nodules are seen. 3. Slightly nodular liver contour, possible cirrhosis 4. Gallstone 5. Heterogeneous enhancement of the prostate gland with calcification. 6. Small free fluid in the pelvis 7. Acute to subacute fracture involving L1 vertebral body with close to 50% loss of height anteriorly 8. No definite CT evidence for metastatic disease to the abdomen or pelvis Electronically Signed   By: Donavan Foil M.D.   On: 03/30/2018 23:06   Ir Thoracentesis Asp Pleural Space W/img Guide  Result Date: 03/31/2018 INDICATION: Patient with history of lung cancer, now with left pleural effusion. Request is made for diagnostic and therapeutic thoracentesis. EXAM: ULTRASOUND GUIDED DIAGNOSTIC AND THERAPEUTIC LEFT THORACENTESIS MEDICATIONS: 10 mL 1% lidocaine COMPLICATIONS: None immediate. PROCEDURE: An ultrasound guided thoracentesis was thoroughly discussed with the patient and questions answered. The benefits, risks, alternatives and complications were also discussed. The patient understands and wishes to proceed with the procedure. Written consent was obtained. Ultrasound was performed to localize and mark an adequate pocket of fluid in the left chest. The area was then prepped and draped in the normal sterile fashion. 1% Lidocaine was used for local anesthesia. Under ultrasound guidance a 6 Fr Safe-T-Centesis catheter was introduced. Thoracentesis was performed. The catheter was removed and a dressing applied. FINDINGS: A total of approximately 1.3 liters of amber fluid was removed. Samples were sent to the laboratory as requested by the clinical team. IMPRESSION: Successful ultrasound guided diagnostic and therapeutic left thoracentesis yielding 1.3 liters of pleural fluid. Read by: Brynda Greathouse PA-C No pneumothorax on postprocedure radiograph. Electronically Signed   By: Lucrezia Europe M.D.   On: 03/31/2018 13:05      Scheduled Meds: . sodium chloride   Intravenous Once  . feeding supplement (ENSURE ENLIVE)  237 mL Oral BID BM  . nicotine  14 mg Transdermal Daily  . pantoprazole  40 mg Oral BID   Continuous Infusions:    LOS: 1 day   Time Spent in minutes   30 minutes   Tyler Mcmillan D.O. on 04/01/2018 at 10:21 AM  Between 7am to 7pm - Please see pager noted on [email or web address]  After 7pm go to [email or web address]  And look for the night coverage person covering for me after hours  [facility] Office  [phone]

## 2018-04-01 NOTE — Progress Notes (Signed)
Patient requesting to speak with MD when she was available. MD paged. Will continue to monitor.

## 2018-04-01 NOTE — Progress Notes (Signed)
CSW consulted from Dr. Ree Kida that the patient could benefit from receiving some additional assistance at a skilled nursing facility.   CSW met with patient and family at bedside. CSW explained that he was being recommended for short term rehab. CSW provided a bed list to the patient and his family.   Patient requested some time to think about it. CSW stated that she would need a decision no later than Sunday morning and that she would be back. Insurance authorization would have to be started.   CSW will continue to follow.   Domenic Schwab, MSW, [place]

## 2018-04-02 LAB — CBC
HCT: 30 % — ABNORMAL LOW (ref 39.0–52.0)
Hemoglobin: 9.7 g/dL — ABNORMAL LOW (ref 13.0–17.0)
MCH: 34.2 pg — ABNORMAL HIGH (ref 26.0–34.0)
MCHC: 32.3 g/dL (ref 30.0–36.0)
MCV: 105.6 fL — ABNORMAL HIGH (ref 80.0–100.0)
NRBC: 0 % (ref 0.0–0.2)
Platelets: 259 10*3/uL (ref 150–400)
RBC: 2.84 MIL/uL — ABNORMAL LOW (ref 4.22–5.81)
RDW: 14.8 % (ref 11.5–15.5)
WBC: 5.7 10*3/uL (ref 4.0–10.5)

## 2018-04-02 NOTE — Progress Notes (Signed)
PROGRESS NOTE    Tyler Mcmillan  DJS:[id number] DOB: 1945/04/10 DOA: 03/30/2018 PCP: [name], MD   Brief Narrative:  HPI on 03/30/2018  Tyler Mcmillan is a 73 y.o. male with a medical history of lung cancer, AAA, anxiety, who presented to the emergency department with complaints of fatigue, weakness, dizziness, dark stools, for several months however worsened in the past week.  Patient has not been on any blood thinners but endorses using Advil for pain.  Patient has seen gastroenterology in the past approximately 6 years ago and had a colonoscopy and endoscopy at that time.  Patient does state that his colonoscopy was abnormal however he never followed up.  Patient was also noted to have a AAA with stent placement by Dr. Donnetta Hutching back in 2018, and was supposed to have a CTA recently however was unable to make it to that appointment.  Endorses falling frequently and unable to move well.  His son is at bedside and also states the same.  Complains of having lower extremity swelling which is been ongoing since 2018 since his stent placement.  He denies current chest pain, shortness of breath, abdominal pain, nausea or vomiting, diarrhea or constipation, painful urination, headache, recent illness or travel.  Interim history Patient admitted with symptomatic anemia.  Gastroenterology consulted and appreciated, patient did have EGD showing nonbleeding ulcer.  Patient does appear to be weak and needing assistance.  PT recommending home health.  Pending OT evaluation.  Assessment & Plan   Symptomatic anemia/GI bleed/acute blood loss anemia -Hemoglobin on admission 9.2, however back in 2018, hemoglobin was 16 -FOBT positive -Patient presenting with fatigue, black tarry stools, shortness of breath -Patient endorses using Advil -Gastroenterology consulted and appreciated -was placed on Protonix drip- transitioned to oral PPI BID -s/p EGD: Normal esophagus, erythematous mucosa in the antrum, biopsied.  1  nonbleeding duodenal ulcer with no stigmata of bleeding.  Recommended PPI twice daily.  Home tomorrow if stable.  GI ollow-up on H. pylori biopsy.  Outpatient colonoscopy -transfused 2 u PRBC this admission -hemoglobin 9.7 -Continue to monitor  Falls -Patient has been falling lately, likely secondary to anemia versus cancer -PT an OT consulted -PT's assessment stated: "Pt with overall poor safety awareness and will require staff assist if OOB. Pt will benefit from skilled PT to increase their independence and safety"- recommended HH PT/ Supervision/assistance 24hr, 3in1 -OT rec HH,/  -Personally given patient's noted weakness and need for moderate assistance with standing, feel patient would benefit from SNF placement.  Will discuss with social work. -Of note, while he was in the room with the patient, he was unable to stand and urinated on himself as well as the floor.  Chronic pain -Continue morphine and oxycodone PRN  Anxiety -Continue low-dose Ativan  History of lung cancer with pleural effusion -Chest x-ray reviewed, showing metastatic foci in left lung.  Well-defined nodular lesion in the anterior segment left upper lobe, suspicious for metastasis.  Large left pleural effusion -Patient was seen in Dr. Earlie Server has had radiation in the past -CTA showed large left-sided pleural effusion.  Low-density lesion in the left upper lobe at the site of prior cavitary mass further decreased in size.  No other suspicious lung nodules seen. -Status post ultrasound-guided thoracentesis yielding 1.3 L of amber fluid -Pending fluid cytology and Gram stain  Acute dyspnea -Possibly secondary to anemia versus pleural effusion -Treatment plan as above  History of AAA -Stent graft repair infrarenal abdominal aortic aneurysm, 09/08/2016  -Patient was to have  repeat CTA of the abdomen pelvis however was too weak to make it to that appointment -CTA A/P as below -Discussed with Dr. Donnetta Hutching, vascular  surgery- consulted and appreciated.  Patient with stable type II endoleak.  He is to follow-up with Dr. Donnetta Hutching in 1 year.  Tobacco abuse -no intentions of quitting -Cotninue nicotine patch  CODE STATUS -Discussed with patient and son, currently DNI.  Moderate malnutrition -Nutrition consulted, continue supplements  DVT Prophylaxis  SCDs  Code Status: DNI  Family Communication: None at bedside  Disposition Plan: TBD.  Given patient's continued weakness, feel that he would benefit from SNF placement.  Social work consulted, patient agreeable to SNF.   Consultants Gastroenterology Vascular surgery  Procedures  EGD  Antibiotics   Anti-infectives (From admission, onward)   None      Subjective:   Tyler Mcmillan seen and examined today.  Continues to feel weak and sore.  Denies chest pain, shortness breath, abdominal pain, nausea or vomiting, diarrhea or to patient, dizziness or headache.  He is open to going to rehab as he feels very weak and unable to walk.   Objective:   Vitals:   04/01/18 0900 04/01/18 1639 04/01/18 2135 04/02/18 0414  BP: 132/77 (!) 131/58 136/77 135/65  Pulse: 90 93 (!) 108 (!) 109  Resp: 18 18 (!) 21   Temp: 98.2 F (36.8 C) 97.8 F (36.6 C) 98.7 F (37.1 C) 98.7 F (37.1 C)  TempSrc: Oral Oral Oral Oral  SpO2: 96% 96% 96% 93%  Weight:   78.3 kg   Height:        Intake/Output Summary (Last 24 hours) at 04/02/2018 0835 Last data filed at 04/02/2018 0414 Gross per 24 hour  Intake 600 ml  Output 0 ml  Net 600 ml   Filed Weights   03/31/18 0852 03/31/18 2014 04/01/18 2135  Weight: 77.8 kg 78.1 kg 78.3 kg   Exam  General: Well developed, chronically ill-appearing, NAD  HEENT: NCAT, mucous membranes moist.   Neck: Supple  Cardiovascular: S1 S2 auscultated, RRR  Respiratory: Diminished breath sounds, occ cough  Abdomen: Soft, nontender, nondistended, + bowel sounds  Extremities: warm dry without cyanosis clubbing. +++LE edema  B/L  Neuro: AAOx3, nonfocal  Data Reviewed: I have personally reviewed following labs and imaging studies  CBC: Recent Labs  Lab 03/30/18 1303 03/31/18 0519 04/01/18 0407 04/02/18 0421  WBC 8.4 4.8 4.7 5.7  HGB 9.2* 9.7* 9.1* 9.7*  HCT 29.9* 30.5* 28.5* 30.0*  MCV 112.0* 107.4* 108.4* 105.6*  PLT 302 243 263 599   Basic Metabolic Panel: Recent Labs  Lab 03/30/18 1303 03/31/18 0519  NA 137 138  K 4.7 3.8  CL 104 106  CO2 22 23  GLUCOSE 130* 87  BUN 15 8  CREATININE 1.02 0.90  CALCIUM 8.7* 8.2*   GFR: Estimated Creatinine Clearance: 76.6 mL/min (by C-G formula based on SCr of 0.9 mg/dL). Liver Function Tests: Recent Labs  Lab 03/30/18 1303  AST 60*  ALT 36  ALKPHOS 115  BILITOT 0.2*  PROT 6.4*  ALBUMIN 2.6*   No results for input(s): LIPASE, AMYLASE in the last 168 hours. No results for input(s): AMMONIA in the last 168 hours. Coagulation Profile: No results for input(s): INR, PROTIME in the last 168 hours. Cardiac Enzymes: Recent Labs  Lab 03/30/18 1531  TROPONINI <0.03   BNP (last 3 results) No results for input(s): PROBNP in the last 8760 hours. HbA1C: No results for input(s): HGBA1C in the last 72  hours. CBG: No results for input(s): GLUCAP in the last 168 hours. Lipid Profile: No results for input(s): CHOL, HDL, LDLCALC, TRIG, CHOLHDL, LDLDIRECT in the last 72 hours. Thyroid Function Tests: No results for input(s): TSH, T4TOTAL, FREET4, T3FREE, THYROIDAB in the last 72 hours. Anemia Panel: No results for input(s): VITAMINB12, FOLATE, FERRITIN, TIBC, IRON, RETICCTPCT in the last 72 hours. Urine analysis:    Component Value Date/Time   COLORURINE YELLOW 03/31/2018 0630   APPEARANCEUR CLEAR 03/31/2018 0630   LABSPEC 1.042 (H) 03/31/2018 0630   PHURINE 5.0 03/31/2018 0630   GLUCOSEU NEGATIVE 03/31/2018 0630   HGBUR SMALL (A) 03/31/2018 0630   BILIRUBINUR NEGATIVE 03/31/2018 0630   KETONESUR NEGATIVE 03/31/2018 0630   PROTEINUR NEGATIVE  03/31/2018 0630   NITRITE NEGATIVE 03/31/2018 0630   LEUKOCYTESUR NEGATIVE 03/31/2018 0630   Sepsis Labs: @LABRCNTIP (procalcitonin:4,lacticidven:4)  ) Recent Results (from the past 240 hour(s))  Gram stain     Status: None   Collection Time: 03/31/18 12:26 PM  Result Value Ref Range Status   Specimen Description FLUID PLEURAL LEFT  Final   Special Requests NONE  Final   Gram Stain   Final    FEW WBC PRESENT, PREDOMINANTLY MONONUCLEAR NO ORGANISMS SEEN Performed at [facility], [place] [address] [place], [place] [address]    Report Status 03/31/2018 FINAL  Final  Culture, body fluid-bottle     Status: None (Preliminary result)   Collection Time: 03/31/18 12:26 PM  Result Value Ref Range Status   Specimen Description FLUID PLEURAL LEFT  Final   Special Requests   Final    NONE Performed at [facility], [place] [address] [place], [place] [address]    Culture NO GROWTH 2 DAYS  Final   Report Status PENDING  Incomplete      Radiology Studies: Dg Chest 1 View  Result Date: 03/31/2018 CLINICAL DATA:  Left thoracentesis. EXAM: CHEST  1 VIEW COMPARISON:  CT chest and chest x-ray from yesterday. FINDINGS: The left heart border remains obscured. Slight interval decrease in size of the moderate left pleural effusion. Relatively unchanged atelectasis in the left lower greater than upper lobes. Unchanged 3.4 cm mass in the left upper lobe. The right lung remains clear. No pneumothorax. No acute osseous abnormality. IMPRESSION: 1. Slight interval decrease in size of moderate left pleural effusion status post thoracentesis. No pneumothorax. 2. Relatively unchanged left lung atelectasis. 3. Unchanged 3.4 cm left upper lobe mass. Electronically Signed   By: Titus Dubin M.D.   On: 03/31/2018 12:42   Ir Thoracentesis Asp Pleural Space W/img Guide  Result Date: 03/31/2018 INDICATION: Patient with history of lung cancer, now with left pleural effusion. Request is made for diagnostic  and therapeutic thoracentesis. EXAM: ULTRASOUND GUIDED DIAGNOSTIC AND THERAPEUTIC LEFT THORACENTESIS MEDICATIONS: 10 mL 1% lidocaine COMPLICATIONS: None immediate. PROCEDURE: An ultrasound guided thoracentesis was thoroughly discussed with the patient and questions answered. The benefits, risks, alternatives and complications were also discussed. The patient understands and wishes to proceed with the procedure. Written consent was obtained. Ultrasound was performed to localize and mark an adequate pocket of fluid in the left chest. The area was then prepped and draped in the normal sterile fashion. 1% Lidocaine was used for local anesthesia. Under ultrasound guidance a 6 Fr Safe-T-Centesis catheter was introduced. Thoracentesis was performed. The catheter was removed and a dressing applied. FINDINGS: A total of approximately 1.3 liters of amber fluid was removed. Samples were sent to the laboratory as requested by the  clinical team. IMPRESSION: Successful ultrasound guided diagnostic and therapeutic left thoracentesis yielding 1.3 liters of pleural fluid. Read by: Brynda Greathouse PA-C No pneumothorax on postprocedure radiograph. Electronically Signed   By: Lucrezia Europe M.D.   On: 03/31/2018 13:05     Scheduled Meds: . sodium chloride   Intravenous Once  . feeding supplement (ENSURE ENLIVE)  237 mL Oral BID BM  . nicotine  14 mg Transdermal Daily  . pantoprazole  40 mg Oral BID   Continuous Infusions:    LOS: 2 days   Time Spent in minutes   30 minutes   Donelle Hise D.O. on 04/02/2018 at 8:35 AM  Between 7am to 7pm - Please see pager noted on [email or web address]  After 7pm go to [email or web address]  And look for the night coverage person covering for me after hours  [facility] Office  [phone]

## 2018-04-02 NOTE — Progress Notes (Signed)
Physical Therapy Treatment Patient Details Name: Tyler Mcmillan MRN: [id number] DOB: [birth year] Today's Date: 04/02/2018    History of Present Illness Pt is a 73 y/o male with a PMH significant for lung CA, AAA, anxiety who presents s/p fatigue, weakness, dizziness, dark stools and frequent falls for several months, acutely worsening in the last week. Pt has undergone thoracentesis and EGD.     PT Comments    Patient seen for activity progression. Patient progressing towards PT goals, but does continue to require some increased assist for some aspects of mobility and remains high fall risk. Spoke with patient at length regarding mobility concerns and possibility to recover function. I do feel patient could benefit from ST SNF for rehabilitation to improve activity tolerance, balance and overall functional status. Patient appears motivated and wants to maximize potential to regain some independence. At this time, have updated recommendations based on patients willingness and potential to benefit from post acute rehabilitation. Will continue to see patient and progress as tolerated.    Follow Up Recommendations  SNF     Equipment Recommendations  3in1 (PT)    Recommendations for Other Services       Precautions / Restrictions Precautions Precautions: Fall Restrictions Weight Bearing Restrictions: No    Mobility  Bed Mobility Overal bed mobility: Modified Independent             General bed mobility comments: no assist required  Transfers Overall transfer level: Needs assistance Equipment used: Rolling walker (2 wheeled) Transfers: Sit to/from Stand Sit to Stand: Min assist         General transfer comment: min assist for stability during power up to standing, Vcs for hand placement and positioning  Ambulation/Gait Ambulation/Gait assistance: Min assist Gait Distance (Feet): 90 Feet Assistive device: Rolling walker (2 wheeled) Gait Pattern/deviations: Decreased  stride length;Trunk flexed;Narrow base of support;Shuffle Gait velocity: Decreased Gait velocity interpretation: <1.8 ft/sec, indicate of risk for recurrent falls General Gait Details: heavy reliance on RW for upright support, some instability noted. VCs for upright posture and increased cadence   Stairs             Wheelchair Mobility    Modified Rankin (Stroke Patients Only)       Balance Overall balance assessment: Needs assistance Sitting-balance support: Feet supported;No upper extremity supported Sitting balance-Leahy Scale: Fair     Standing balance support: No upper extremity supported;During functional activity Standing balance-Leahy Scale: Poor Standing balance comment: Min guard in static standing with use of RW for external support                            Cognition Arousal/Alertness: Awake/alert Behavior During Therapy: WFL for tasks assessed/performed Overall Cognitive Status: Impaired/Different from baseline Area of Impairment: Following commands;Safety/judgement;Attention;Problem solving;Awareness                   Current Attention Level: Selective   Following Commands: Follows one step commands with increased time Safety/Judgement: Decreased awareness of safety;Decreased awareness of deficits Awareness: Emergent Problem Solving: Requires verbal cues        Exercises      General Comments        Pertinent Vitals/Pain Pain Assessment: No/denies pain    Home Living                      Prior Function            PT Goals (current  goals can now be found in the care plan section) Acute Rehab PT Goals Patient Stated Goal: to get better PT Goal Formulation: With patient/family Time For Goal Achievement: 04/07/18 Potential to Achieve Goals: Good Progress towards PT goals: Progressing toward goals    Frequency    Min 2X/week      PT Plan Discharge plan needs to be updated    Co-evaluation               AM-PAC PT "6 Clicks" Mobility   Outcome Measure  Help needed turning from your back to your side while in a flat bed without using bedrails?: None Help needed moving from lying on your back to sitting on the side of a flat bed without using bedrails?: None Help needed moving to and from a bed to a chair (including a wheelchair)?: A Little Help needed standing up from a chair using your arms (e.g., wheelchair or bedside chair)?: A Little Help needed to walk in hospital room?: A Little Help needed climbing 3-5 steps with a railing? : A Lot 6 Click Score: 19    End of Session Equipment Utilized During Treatment: Gait belt Activity Tolerance: Patient tolerated treatment well Patient left: in bed;with call bell/phone within reach;with bed alarm set Nurse Communication: Mobility status PT Visit Diagnosis: Unsteadiness on feet (R26.81);Difficulty in walking, not elsewhere classified (R26.2);Muscle weakness (generalized) (M62.81)     Time: 6384-5364 PT Time Calculation (min) (ACUTE ONLY): 21 min  Charges:  $Gait Training: 8-22 mins                     Tyler Mcmillan, PT DPT  Board Certified Neurologic Specialist [place] Pager [phone] Office [phone]    Tyler Mcmillan 04/02/2018, 2:58 PM

## 2018-04-02 NOTE — Progress Notes (Addendum)
CSW met with patient at bedside. He was alert and oriented. He is requesting more time to decide with his family about home health versus going to SNF. CSW did remind the patient that he would need to make a decision no later than Monday morning. CSW reminded the patient CSW Lorriane Shire would be back and would check in first thing in the morning.   CSW will continue to follow.   Domenic Schwab, MSW, [place]

## 2018-04-02 NOTE — Progress Notes (Signed)
Patient is high fall risk and is refusing assistance with bathing. Education provided to patient about importance with assistance and supervision to prevent falls and injuries. Patient stated that he understood, but would like privacy during this time. Continued to check on patient periodically. Will continue to educate and monitor.

## 2018-04-03 ENCOUNTER — Inpatient Hospital Stay (HOSPITAL_COMMUNITY): Payer: Medicare Other

## 2018-04-03 ENCOUNTER — Encounter (HOSPITAL_COMMUNITY): Payer: Self-pay | Admitting: Student

## 2018-04-03 DIAGNOSIS — R0600 Dyspnea, unspecified: Secondary | ICD-10-CM

## 2018-04-03 HISTORY — PX: IR THORACENTESIS ASP PLEURAL SPACE W/IMG GUIDE: IMG5380

## 2018-04-03 LAB — BASIC METABOLIC PANEL
Anion gap: 6 (ref 5–15)
BUN: 5 mg/dL — ABNORMAL LOW (ref 8–23)
CO2: 27 mmol/L (ref 22–32)
Calcium: 7.7 mg/dL — ABNORMAL LOW (ref 8.9–10.3)
Chloride: 102 mmol/L (ref 98–111)
Creatinine, Ser: 0.8 mg/dL (ref 0.61–1.24)
GFR calc Af Amer: >60 mL/min (ref >60)
GFR calc non Af Amer: >60 mL/min (ref >60)
Glucose, Bld: 94 mg/dL (ref 70–99)
POTASSIUM: 3.7 mmol/L (ref 3.5–5.1)
Sodium: 135 mmol/L (ref 135–145)

## 2018-04-03 LAB — ECHOCARDIOGRAM COMPLETE
Height: 70 in
WEIGHTICAEL: 2758.4 [oz_av]

## 2018-04-03 LAB — HEMOGLOBIN AND HEMATOCRIT, BLOOD
HCT: 28 % — ABNORMAL LOW (ref 39.0–52.0)
Hemoglobin: 9.1 g/dL — ABNORMAL LOW (ref 13.0–17.0)

## 2018-04-03 MED ORDER — LIDOCAINE HCL 1 % IJ SOLN
INTRAMUSCULAR | Status: AC
  Filled 2018-04-03: qty 20

## 2018-04-03 NOTE — Progress Notes (Signed)
PROGRESS NOTE    Tyler Mcmillan  UMP:[id number] DOB: [birth year] DOA: 03/30/2018 PCP: [name], MD   Brief Narrative:  HPI on 03/30/2018  Tyler Mcmillan is a 73 y.o. male with a medical history of lung cancer, AAA, anxiety, who presented to the emergency department with complaints of fatigue, weakness, dizziness, dark stools, for several months however worsened in the past week.  Patient has not been on any blood thinners but endorses using Advil for pain.  Patient has seen gastroenterology in the past approximately 6 years ago and had a colonoscopy and endoscopy at that time.  Patient does state that his colonoscopy was abnormal however he never followed up.  Patient was also noted to have a AAA with stent placement by Dr. Donnetta Hutching back in 2018, and was supposed to have a CTA recently however was unable to make it to that appointment.  Endorses falling frequently and unable to move well.  His son is at bedside and also states the same.  Complains of having lower extremity swelling which is been ongoing since 2018 since his stent placement.  He denies current chest pain, shortness of breath, abdominal pain, nausea or vomiting, diarrhea or constipation, painful urination, headache, recent illness or travel.  Interim history Patient admitted with symptomatic anemia.  Gastroenterology consulted and appreciated, patient did have EGD showing nonbleeding ulcer.  Patient does appear to be weak and needing assistance.  PT recommending SNF now. Pending SNF. Will order repeat thoracentesis. Assessment & Plan   Symptomatic anemia/GI bleed/acute blood loss anemia -Hemoglobin on admission 9.2, however back in 2018, hemoglobin was 16 -FOBT positive -Patient presenting with fatigue, black tarry stools, shortness of breath -Patient endorses using Advil -Gastroenterology consulted and appreciated -was placed on Protonix drip- transitioned to oral PPI BID -s/p EGD: Normal esophagus, erythematous mucosa in the  antrum, biopsied.  1 nonbleeding duodenal ulcer with no stigmata of bleeding.  Recommended PPI twice daily.  Home tomorrow if stable.  GI ollow-up on H. pylori biopsy.  Outpatient colonoscopy -transfused 2 u PRBC this admission -hemoglobin 9.1 -Continue to monitor  Falls -Patient has been falling lately, likely secondary to anemia versus cancer -PT an OT consulted -PT's assessment stated: "Pt with overall poor safety awareness and will require staff assist if OOB. Pt will benefit from skilled PT to increase their independence and safety"- recommended HH PT/ Supervision/assistance 24hr, 3in1 -OT recommending HH, 24 supervision -Personally given patient's noted weakness and need for moderate assistance with standing, feel patient would benefit from SNF placement.  Will discuss with social work. -On 04/01/2000, while I was in the room with the patient, he was unable to stand and urinated on himself as well as the floor. -PT reassessed patient over the weekend, recommended SNF -Social work consulted   Chronic pain -Continue morphine and oxycodone PRN  Anxiety -Continue low-dose Ativan  History of lung cancer with pleural effusion -on admission, Chest x-ray reviewed, showing metastatic foci in left lung.  Well-defined nodular lesion in the anterior segment left upper lobe, suspicious for metastasis.  Large left pleural effusion -Patient was seen in Dr. Earlie Server has had radiation in the past -CTA showed large left-sided pleural effusion.  Low-density lesion in the left upper lobe at the site of prior cavitary mass further decreased in size.  No other suspicious lung nodules seen. -Status post ultrasound-guided thoracentesis yielding 1.3 L of amber fluid -Pending fluid cytology and Gram stain -CXR obtained today, showing moderate left pleural effusion  Acute dyspnea -Possibly secondary to anemia  versus pleural effusion -Treatment plan as above -will also obtain echocadiogram as patient  continues to have cough and LE edema (h/o radiation therapy)  History of AAA -Stent graft repair infrarenal abdominal aortic aneurysm, 09/08/2016  -Patient was to have repeat CTA of the abdomen pelvis however was too weak to make it to that appointment -CTA A/P as below -Discussed with Dr. Donnetta Hutching, vascular surgery- consulted and appreciated.  Patient with stable type II endoleak.  He is to follow-up with Dr. Donnetta Hutching in 1 year.  Tobacco abuse -no intentions of quitting -Cotninue nicotine patch  CODE STATUS -Discussed with patient and son, currently DNI.  Moderate malnutrition -Nutrition consulted, continue supplements  DVT Prophylaxis  SCDs  Code Status: DNI  Family Communication: None at bedside  Disposition Plan: SNF. Will obtain repeat Thoracentesis today.    Consultants Gastroenterology Vascular surgery  Procedures  EGD  Antibiotics   Anti-infectives (From admission, onward)   None      Subjective:   [name] seen and examined today.  Continues to feel weak and complain about his lower edema, soreness. Has cough. Denies chest pain, abdominal pain, N/V/D/C.   Objective:   Vitals:   04/02/18 0414 04/02/18 1737 04/02/18 2113 04/03/18 0425  BP: 135/65 (!) 149/96 (!) 138/95 (!) 141/75  Pulse: (!) 109 95 94 91  Resp:  (!) 21 20 19   Temp: 98.7 F (37.1 C) 98.6 F (37 C) 98.3 F (36.8 C) 98 F (36.7 C)  TempSrc: Oral Oral Oral Oral  SpO2: 93% 96% 94% 96%  Weight:   78.2 kg   Height:        Intake/Output Summary (Last 24 hours) at 04/03/2018 0850 Last data filed at 04/03/2018 0800 Gross per 24 hour  Intake 520 ml  Output 0 ml  Net 520 ml   Filed Weights   03/31/18 2014 04/01/18 2135 04/02/18 2113  Weight: 78.1 kg 78.3 kg 78.2 kg   Exam  General: Well developed, chronically ill-appearing, NAD  HEENT: NCAT,mucous membranes moist.   Neck: Supple  Cardiovascular: S1 S2 auscultated, RRR  Respiratory: Diminished breath sounds, on the left.   Occasional cough.  Otherwise clear  Abdomen: Soft, nontender, nondistended, + bowel sounds  Extremities: warm dry without cyanosis clubbing. +++LE Edema B/L  Neuro: AAOx3, nonfocal  Psych: N appropriate mood and affect  Data Reviewed: I have personally reviewed following labs and imaging studies  CBC: Recent Labs  Lab 03/30/18 1303 03/31/18 0519 04/01/18 0407 04/02/18 0421 04/03/18 0334  WBC 8.4 4.8 4.7 5.7  --   HGB 9.2* 9.7* 9.1* 9.7* 9.1*  HCT 29.9* 30.5* 28.5* 30.0* 28.0*  MCV 112.0* 107.4* 108.4* 105.6*  --   PLT 302 243 263 259  --    Basic Metabolic Panel: Recent Labs  Lab 03/30/18 1303 03/31/18 0519 04/03/18 0334  NA 137 138 135  K 4.7 3.8 3.7  CL 104 106 102  CO2 22 23 27   GLUCOSE 130* 87 94  BUN 15 8 5*  CREATININE 1.02 0.90 0.80  CALCIUM 8.7* 8.2* 7.7*   GFR: Estimated Creatinine Clearance: 86.2 mL/min (by C-G formula based on SCr of 0.8 mg/dL). Liver Function Tests: Recent Labs  Lab 03/30/18 1303  AST 60*  ALT 36  ALKPHOS 115  BILITOT 0.2*  PROT 6.4*  ALBUMIN 2.6*   No results for input(s): LIPASE, AMYLASE in the last 168 hours. No results for input(s): AMMONIA in the last 168 hours. Coagulation Profile: No results for input(s): INR, PROTIME in the  last 168 hours. Cardiac Enzymes: Recent Labs  Lab 03/30/18 1531  TROPONINI <0.03   BNP (last 3 results) No results for input(s): PROBNP in the last 8760 hours. HbA1C: No results for input(s): HGBA1C in the last 72 hours. CBG: No results for input(s): GLUCAP in the last 168 hours. Lipid Profile: No results for input(s): CHOL, HDL, LDLCALC, TRIG, CHOLHDL, LDLDIRECT in the last 72 hours. Thyroid Function Tests: No results for input(s): TSH, T4TOTAL, FREET4, T3FREE, THYROIDAB in the last 72 hours. Anemia Panel: No results for input(s): VITAMINB12, FOLATE, FERRITIN, TIBC, IRON, RETICCTPCT in the last 72 hours. Urine analysis:    Component Value Date/Time   COLORURINE YELLOW 03/31/2018  0630   APPEARANCEUR CLEAR 03/31/2018 0630   LABSPEC 1.042 (H) 03/31/2018 0630   PHURINE 5.0 03/31/2018 0630   GLUCOSEU NEGATIVE 03/31/2018 0630   HGBUR SMALL (A) 03/31/2018 0630   BILIRUBINUR NEGATIVE 03/31/2018 0630   KETONESUR NEGATIVE 03/31/2018 0630   PROTEINUR NEGATIVE 03/31/2018 0630   NITRITE NEGATIVE 03/31/2018 0630   LEUKOCYTESUR NEGATIVE 03/31/2018 0630   Sepsis Labs: @LABRCNTIP (procalcitonin:4,lacticidven:4)  ) Recent Results (from the past 240 hour(s))  Gram stain     Status: None   Collection Time: 03/31/18 12:26 PM  Result Value Ref Range Status   Specimen Description FLUID PLEURAL LEFT  Final   Special Requests NONE  Final   Gram Stain   Final    FEW WBC PRESENT, PREDOMINANTLY MONONUCLEAR NO ORGANISMS SEEN Performed at [facility], [address], [place], [place] [address]    Report Status 03/31/2018 FINAL  Final  Culture, body fluid-bottle     Status: None (Preliminary result)   Collection Time: 03/31/18 12:26 PM  Result Value Ref Range Status   Specimen Description FLUID PLEURAL LEFT  Final   Special Requests   Final    NONE Performed at [facility], [place] [address], [place], [place] [address]    Culture NO GROWTH 2 DAYS  Final   Report Status PENDING  Incomplete      Radiology Studies: Dg Chest Port 1 View  Result Date: 04/03/2018 CLINICAL DATA:  Status post left thoracentesis. EXAM: PORTABLE CHEST 1 VIEW COMPARISON:  Radiograph of March 31, 2018. FINDINGS: Stable cardiomediastinal silhouette. Atherosclerosis of thoracic aorta is noted. Moderate left pleural effusion is noted with underlying atelectasis or infiltrate. No pneumothorax is noted. Stable left upper lobe mass is noted. Right lung is clear. Bony thorax is unremarkable. IMPRESSION: Stable moderate left pleural effusion is noted with underlying atelectasis or infiltrate. Stable left upper lobe mass is noted. Electronically Signed   By: Marijo Conception, M.D.   On: 04/03/2018 08:28      Scheduled Meds: . sodium chloride   Intravenous Once  . feeding supplement (ENSURE ENLIVE)  237 mL Oral BID BM  . lidocaine      . nicotine  14 mg Transdermal Daily  . pantoprazole  40 mg Oral BID   Continuous Infusions:    LOS: 3 days   Time Spent in minutes   30 minutes   Nannie Starzyk D.O. on 04/03/2018 at 8:50 AM  Between 7am to 7pm - Please see pager noted on [email or web address]  After 7pm go to [email or web address]  And look for the night coverage person covering for me after hours  [facility] Office  [phone]

## 2018-04-03 NOTE — Progress Notes (Signed)
  Echocardiogram 2D Echocardiogram has been performed.  Tyler Mcmillan M 04/03/2018, 11:07 AM

## 2018-04-03 NOTE — Progress Notes (Signed)
Occupational Therapy Treatment Patient Details Name: Tyler Mcmillan MRN: [id number] DOB: Jun 21, 1945 Today's Date: 04/03/2018    History of present illness Pt is a 73 y/o male with a PMH significant for lung CA, AAA, anxiety who presents s/p fatigue, weakness, dizziness, dark stools and frequent falls for several months, acutely worsening in the last week. Pt has undergone thoracentesis and EGD.    OT comments  Pt making good progress with functional goals. OT will continue to follow acutely  Follow Up Recommendations  Home health OT;Supervision/Assistance - 24 hour    Equipment Recommendations  3 in 1 bedside commode    Recommendations for Other Services      Precautions / Restrictions Precautions Precautions: Fall Restrictions Weight Bearing Restrictions: No       Mobility Bed Mobility Overal bed mobility: Modified Independent                Transfers Overall transfer level: Needs assistance Equipment used: Rolling walker (2 wheeled) Transfers: Sit to/from Stand Sit to Stand: Min guard         General transfer comment: assist to power up, cues for hand placement    Balance Overall balance assessment: Needs assistance Sitting-balance support: Feet supported;No upper extremity supported Sitting balance-Leahy Scale: Fair     Standing balance support: During functional activity Standing balance-Leahy Scale: Poor                             ADL either performed or assessed with clinical judgement   ADL Overall ADL's : Needs assistance/impaired     Grooming: Wash/dry hands;Standing;Wash/dry face;Supervision/safety;Set up       Lower Body Bathing: Sit to/from stand;Min guard Lower Body Bathing Details (indicate cue type and reason): simulated     Lower Body Dressing: Sit to/from stand;Min guard   Toilet Transfer: Ambulation;RW;Supervision/safety;Regular Toilet;Grab bars   Toileting- Clothing Manipulation and Hygiene: Min guard;Sit  to/from stand   Tub/ Shower Transfer: Min guard;Supervision/safety;Ambulation;Rolling walker;Grab bars   Functional mobility during ADLs: Min guard;Rolling walker General ADL Comments: poor safety awareness and balance      Vision Baseline Vision/History: Wears glasses Wears Glasses: At all times Patient Visual Report: No change from baseline     Perception     Praxis      Cognition Arousal/Alertness: Awake/alert Behavior During Therapy: WFL for tasks assessed/performed Overall Cognitive Status: Impaired/Different from baseline Area of Impairment: Following commands;Safety/judgement;Awareness;Problem solving                         Safety/Judgement: Decreased awareness of safety;Decreased awareness of deficits   Problem Solving: Requires verbal cues          Exercises     Shoulder Instructions       General Comments      Pertinent Vitals/ Pain       Pain Assessment: 0-10 Pain Score: 4  Pain Location: L ribs, back  Pain Descriptors / Indicators: Discomfort;Sore Pain Intervention(s): Monitored during session;Repositioned  Home Living                                          Prior Functioning/Environment              Frequency  Min 2X/week        Progress Toward Goals  OT Goals(current goals can now  be found in the care plan section)  Progress towards OT goals: Progressing toward goals     Plan Discharge plan remains appropriate    Co-evaluation                 AM-PAC OT "6 Clicks" Daily Activity     Outcome Measure   Help from another person eating meals?: None Help from another person taking care of personal grooming?: A Little Help from another person toileting, which includes using toliet, bedpan, or urinal?: A Little Help from another person bathing (including washing, rinsing, drying)?: A Little Help from another person to put on and taking off regular upper body clothing?: None Help from another  person to put on and taking off regular lower body clothing?: A Little 6 Click Score: 20    End of Session Equipment Utilized During Treatment: Rolling walker  OT Visit Diagnosis: Repeated falls (R29.6);Muscle weakness (generalized) (M62.81);Unsteadiness on feet (R26.81)   Activity Tolerance Patient tolerated treatment well   Patient Left with call bell/phone within reach;in bed;with family/visitor present   Nurse Communication          Time: 6761-9509 OT Time Calculation (min): 18 min  Charges: OT General Charges $OT Visit: 1 Visit OT Treatments $Self Care/Home Management : 8-22 mins     Britt Bottom 04/03/2018, 1:13 PM

## 2018-04-03 NOTE — Procedures (Signed)
PROCEDURE SUMMARY:  Successful US guided therapeutic left thoracentesis. Yielded 1.2 liters of amber fluid. Pt tolerated procedure well, however procedure was stopped prior to removal of all fluid due to patient's pain. No immediate complications.  Specimen was not sent for labs. CXR ordered.  EBL < 5 mL  Docia Barrier PA-C 04/03/2018 10:10 AM

## 2018-04-03 NOTE — Clinical Social Work Note (Signed)
Clinical Social Work Assessment  Patient Details  Name: Tyler Mcmillan MRN: [id number] Date of Birth: [birth year]  Date of referral:  04/01/18               Reason for consult:  Discharge Planning, Facility Placement                Permission sought to share information with:  Family Supports Permission granted to share information::  Yes, Verbal Permission Granted  Name::     Camrin Gearheart  Agency::     Relationship::  Wife  Contact Information:  [phone]  Housing/Transportation Living arrangements for the past 2 months:  [place] of Information:  Patient Patient Interpreter Needed:  None Criminal Activity/Legal Involvement Pertinent to Current Situation/Hospitalization:  No - Comment as needed Significant Relationships:  Spouse, Other Family Members Lives with:  Spouse Do you feel safe going back to the place where you live?  Yes(Patient feels safe at home, however he is agreeable to [facility] rehab prior to returning home) Need for family participation in patient care:  Yes (Comment)  Care giving concerns: Patient did not express any care giving concerns, however he is now in agreement with ST rehab before returning home.  Social Worker assessment / plan: CSW talked with patient at the bedside regarding his discharge plan and recommendation of ST rehab. Patient had spoken with a CSW over the weekend and had been given a skilled facility list. Mr. [place] now agreeable to ST rehab and provided CSW with his preferences: [place], [facility], [facility]. CSW explained the SNF search process and his questions were answered.  Mr. Byrnes informed CSW that he is from [place] and came to [place] in 2008. His wife's son and family live in [place] and he is a Company secretary.  Employment status:  Retired Brewing technologist) PT Recommendations:  [place] / Referral to community resources:  [facility]  Facility(Patient was provided with a skilled facility list)  Patient/Family's Response to care: Mr. Uy expressed no concerns regarding his care during hospitalization.  Patient/Family's Understanding of and Emotional Response to Diagnosis, Current Treatment, and Prognosis: Patient appears to understand the need for rehab and is agreeable.  Emotional Assessment Appearance:  Appears stated age Attitude/Demeanor/Rapport:  Engaged Affect (typically observed):  Pleasant, Appropriate Orientation:  Oriented to Self, Oriented to Place, Oriented to  Time, Oriented to Situation Alcohol / Substance use:  Tobacco Use, Alcohol Use, Illicit Drugs(Patient reports that he currently smokes, drinks 24 standard drinks per week and does not use illicit drugs) Psych involvement (Current and /or in the community):  No (Comment)  Discharge Needs  Concerns to be addressed:  Discharge Planning Concerns Readmission within the last 30 days:  No Current discharge risk:  None Barriers to Discharge:  Continued Medical Work up(Working with patient on SNF placement)   Sable Feil, [place] 04/03/2018, 5:53 PM

## 2018-04-03 NOTE — Care Management Important Message (Signed)
Important Message  Patient Details  Name: Tyler Mcmillan MRN: [id number] Date of Birth: 28-Aug-1945   Medicare Important Message Given:  Yes    Tyler Mcmillan 04/03/2018, 4:20 PM

## 2018-04-03 NOTE — CV Procedure (Signed)
Echocardiogram not completed, patient going for thoracentesis.  Tyler Mcmillan RDCS

## 2018-04-04 ENCOUNTER — Inpatient Hospital Stay (HOSPITAL_COMMUNITY): Payer: Medicare Other

## 2018-04-04 ENCOUNTER — Encounter (HOSPITAL_COMMUNITY): Payer: Self-pay | Admitting: Student

## 2018-04-04 HISTORY — PX: IR THORACENTESIS ASP PLEURAL SPACE W/IMG GUIDE: IMG5380

## 2018-04-04 LAB — COMPREHENSIVE METABOLIC PANEL
ALT: 20 U/L (ref 0–44)
AST: 33 U/L (ref 15–41)
Albumin: 2.1 g/dL — ABNORMAL LOW (ref 3.5–5.0)
Alkaline Phosphatase: 79 U/L (ref 38–126)
Anion gap: 8 (ref 5–15)
BUN: 7 mg/dL — ABNORMAL LOW (ref 8–23)
CO2: 26 mmol/L (ref 22–32)
Calcium: 8 mg/dL — ABNORMAL LOW (ref 8.9–10.3)
Chloride: 101 mmol/L (ref 98–111)
Creatinine, Ser: 0.93 mg/dL (ref 0.61–1.24)
GFR calc Af Amer: >60 mL/min (ref >60)
GFR calc non Af Amer: >60 mL/min (ref >60)
Glucose, Bld: 90 mg/dL (ref 70–99)
Potassium: 3.6 mmol/L (ref 3.5–5.1)
SODIUM: 135 mmol/L (ref 135–145)
Total Bilirubin: 0.6 mg/dL (ref 0.3–1.2)
Total Protein: 5.4 g/dL — ABNORMAL LOW (ref 6.5–8.1)

## 2018-04-04 LAB — PROTEIN, PLEURAL OR PERITONEAL FLUID

## 2018-04-04 LAB — ALBUMIN, PLEURAL OR PERITONEAL FLUID: Albumin, Fluid: 1 g/dL

## 2018-04-04 LAB — LACTATE DEHYDROGENASE, PLEURAL OR PERITONEAL FLUID: LD, Fluid: 180 U/L — ABNORMAL HIGH (ref 3–23)

## 2018-04-04 LAB — HEMOGLOBIN AND HEMATOCRIT, BLOOD
HCT: 29.1 % — ABNORMAL LOW (ref 39.0–52.0)
Hemoglobin: 9.3 g/dL — ABNORMAL LOW (ref 13.0–17.0)

## 2018-04-04 MED ORDER — LIDOCAINE HCL 1 % IJ SOLN
INTRAMUSCULAR | Status: AC
  Filled 2018-04-04: qty 20

## 2018-04-04 MED ORDER — FUROSEMIDE 10 MG/ML IJ SOLN
20.0000 mg | Freq: Once | INTRAMUSCULAR | Status: AC
  Administered 2018-04-04: 20 mg via INTRAVENOUS
  Filled 2018-04-04: qty 2

## 2018-04-04 NOTE — NC FL2 (Signed)
[place] MEDICAID FL2 LEVEL OF CARE SCREENING TOOL     IDENTIFICATION  Patient Name: Tyler Mcmillan Birthdate: 1945/07/23 Sex: male Admission Date (Current Location): 03/30/2018  [facility] and [place] Number:  Herbalist and Address:  [facility], [place] [address] [place], [place] [address]      Provider Number: [id number]  Attending Physician Name and Address:  Cristal Ford, DO  Relative Name and Phone Number:  Merlon Alcorta, Wife, [phone]    Current Level of Care: Hospital Recommended Level of Care: [place] Prior Approval Number:    Date Approved/Denied: 04/04/18 PASRR Number: [id number]  Discharge Plan: SNF    Current Diagnoses: Patient Active Problem List   Diagnosis Date Noted  . GI bleed 03/31/2018  . Acute post-hemorrhagic anemia   . Malignant neoplasm metastatic to left lung ([place])   . Peripheral edema   . Pleural effusion on left   . Pleural effusion, left   . Fall at home, initial encounter   . Generalized weakness   . Acute GI bleeding 03/30/2018  . 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 Hypertensive heart disease without heart failure   . Chest pain 07/02/2016  . Cavitary lesion of lung 07/02/2016  . Unstable angina ([place])   . AAA (abdominal aortic aneurysm) ([place]) 07/09/2014  . Lumbar degenerative disc disease 07/09/2014    Orientation RESPIRATION BLADDER Height & Weight     Self, Time, Situation, Place  Normal Continent Weight: 172 lb 6.4 oz (78.2 kg) Height:  5\' 10"  (177.8 cm)  BEHAVIORAL SYMPTOMS/MOOD NEUROLOGICAL BOWEL NUTRITION STATUS      Continent Diet(Soft food diet, thin liquids)  AMBULATORY STATUS COMMUNICATION OF NEEDS Skin   Supervision Verbally Bruising, Other (Comment)(Brusing on left/right arms and legs, MASD on sacrum (left and right sides))                       Personal Care Assistance Level of Assistance  Bathing, Dressing, Total  care, Feeding Bathing Assistance: Limited assistance Feeding assistance: Independent Dressing Assistance: Limited assistance Total Care Assistance: Limited assistance   Functional Limitations Info  Hearing, Sight, Speech Sight Info: Adequate Hearing Info: Adequate Speech Info: Adequate    SPECIAL CARE FACTORS FREQUENCY  PT (By licensed PT)     PT Frequency: 5x/wk OT Frequency: 5x/wk            Contractures Contractures Info: Not present    Additional Factors Info  Code Status, Allergies Code Status Info: Partial Code Allergies Info:  No Known Allergies           Current Medications (04/04/2018):  This is the current hospital active medication list Current Facility-Administered Medications  Medication Dose Route Frequency Provider Last Rate Last Dose  . 0.9 %  sodium chloride infusion (Manually program via Guardrails IV Fluids)   Intravenous Once Anson Fret F, MD      . acetaminophen (TYLENOL) tablet 650 mg  650 mg Oral Q6H PRN Wonda Horner, MD       Or  . acetaminophen (TYLENOL) suppository 650 mg  650 mg Rectal Q6H PRN Wonda Horner, MD      . feeding supplement (ENSURE ENLIVE) (ENSURE ENLIVE) liquid 237 mL  237 mL Oral BID BM Mikhail, Maryann, DO   237 mL at 04/04/18 1324  . lidocaine (XYLOCAINE) 1 % (with pres) injection   Infiltration PRN Docia Barrier, PA   10 mL at  04/03/18 4103  . lidocaine (XYLOCAINE) 1 % (with pres) injection           . LORazepam (ATIVAN) injection 0.5 mg  0.5 mg Intravenous Q6H PRN Wonda Horner, MD   0.5 mg at 04/04/18 0859  . morphine 2 MG/ML injection 2 mg  2 mg Intravenous Q4H PRN Anson Fret F, MD   2 mg at 04/04/18 1329  . nicotine (NICODERM CQ - dosed in mg/24 hours) patch 14 mg  14 mg Transdermal Daily Wonda Horner, MD   14 mg at 04/04/18 0901  . ondansetron (ZOFRAN) tablet 4 mg  4 mg Oral Q6H PRN Wonda Horner, MD       Or  . ondansetron [facility]) injection 4 mg  4 mg Intravenous Q6H PRN Anson Fret F, MD       . oxyCODONE (Oxy IR/ROXICODONE) immediate release tablet 5 mg  5 mg Oral Q4H PRN Cristal Ford, DO   5 mg at 04/04/18 0056  . pantoprazole (PROTONIX) EC tablet 40 mg  40 mg Oral BID Cristal Ford, DO   40 mg at 04/04/18 0930     Discharge Medications: Please see discharge summary for a list of discharge medications.  Relevant Imaging Results:  Relevant Lab Results:   Additional Information SSN: [id number]  Philippa Chester Leilani Cespedes, LCSWA

## 2018-04-04 NOTE — Procedures (Signed)
PROCEDURE SUMMARY:  Successful US guided diagnostic and therapeutic left thoracentesis. Yielded 800 mL of amber fluid. Pt tolerated procedure well, however stopped prior to removal of all fluid due to significant pain. No immediate complications.  Specimen was sent for labs. CXR ordered.  EBL < 5 mL  Docia Barrier PA-C 04/04/2018 1:02 PM

## 2018-04-04 NOTE — Progress Notes (Signed)
PROGRESS NOTE    Tyler Mcmillan  HYW:[id number] DOB: February 26, 1945 DOA: 03/30/2018 PCP: [name], MD   Brief Narrative:  HPI on 03/30/2018  Tyler Mcmillan is a 73 y.o. male with a medical history of lung cancer, AAA, anxiety, who presented to the emergency department with complaints of fatigue, weakness, dizziness, dark stools, for several months however worsened in the past week.  Patient has not been on any blood thinners but endorses using Advil for pain.  Patient has seen gastroenterology in the past approximately 6 years ago and had a colonoscopy and endoscopy at that time.  Patient does state that his colonoscopy was abnormal however he never followed up.  Patient was also noted to have a AAA with stent placement by Dr. Donnetta Hutching back in 2018, and was supposed to have a CTA recently however was unable to make it to that appointment.  Endorses falling frequently and unable to move well.  His son is at bedside and also states the same.  Complains of having lower extremity swelling which is been ongoing since 2018 since his stent placement.  He denies current chest pain, shortness of breath, abdominal pain, nausea or vomiting, diarrhea or constipation, painful urination, headache, recent illness or travel.  Interim history Patient admitted with symptomatic anemia.  Gastroenterology consulted and appreciated, patient did have EGD showing nonbleeding ulcer.  Patient does appear to be weak and needing assistance.  Patient with pleural effusion, s/p thoracentesis x 2, pending repeat today. Will order repeat thoracentesis.PT recommending SNF, pending. .  Assessment & Plan   Symptomatic anemia/GI bleed/acute blood loss anemia -Hemoglobin on admission 9.2, however back in 2018, hemoglobin was 16 -FOBT positive -Patient presenting with fatigue, black tarry stools, shortness of breath -Patient endorses using Advil -Gastroenterology consulted and appreciated -was placed on Protonix drip- transitioned to oral  PPI BID -s/p EGD: Normal esophagus, erythematous mucosa in the antrum, biopsied.  1 nonbleeding duodenal ulcer with no stigmata of bleeding.  Recommended PPI twice daily.  Home tomorrow if stable.  GI ollow-up on H. pylori biopsy.  Outpatient colonoscopy -transfused 2 u PRBC this admission -hemoglobin 9.3 -Continue to monitor  Falls -Patient has been falling lately, likely secondary to anemia versus cancer -PT an OT consulted -PT's assessment stated: "Pt with overall poor safety awareness and will require staff assist if OOB. Pt will benefit from skilled PT to increase their independence and safety"- recommended HH PT/ Supervision/assistance 24hr, 3in1 -OT recommending HH, 24 supervision -Personally given patient's noted weakness and need for moderate assistance with standing, feel patient would benefit from SNF placement.  Will discuss with social work. -On 04/01/2000, while I was in the room with the patient, he was unable to stand and urinated on himself as well as the floor. -PT reassessed patient over the weekend, recommended SNF -Social work consulted   Chronic pain -Continue morphine and oxycodone PRN  Anxiety -Continue low-dose Ativan  History of lung cancer with pleural effusion -on admission, Chest x-ray reviewed, showing metastatic foci in left lung.  Well-defined nodular lesion in the anterior segment left upper lobe, suspicious for metastasis.  Large left pleural effusion -Patient was seen in Dr. Earlie Server has had radiation in the past -CTA showed large left-sided pleural effusion.  Low-density lesion in the left upper lobe at the site of prior cavitary mass further decreased in size.  No other suspicious lung nodules seen. -Status post ultrasound-guided thoracentesis yielding 1.3 L of amber fluid on 03/31/2018 -Cytology from pleural fluid on 2/21 showed no malignant  cells -Gram stain/culture no growth to date -Repeat thoracentesis on 04/03/2018, yielded 1.2 L of amber  fluid -Repeat CXR continues to show a moderate pleural effusion -Discussed with IR, will attempt 3rd thoracentesis today, will send that for labs -Etiology of pleural effusion unknown at this time. Patient does have cirrhosis of the liver, however protein level does not correspond. -Echocardiogram obtained, see results below -?feel this may be due to malignancy. Discussed with pulmonology, if cytology remains negative for malignancy, may need pleuroscopy (CTS surg) -Will give dose of IV lasix  Acute dyspnea -Improved; no hypoxia noted -Possibly secondary to anemia versus pleural effusion -Treatment plan as above -Echocardiogram EF >65%.  Mildly increased LV wall thickness.  LV diastolic parameters are indeterminate.  History of AAA -Stent graft repair infrarenal abdominal aortic aneurysm, 09/08/2016  -Patient was to have repeat CTA of the abdomen pelvis however was too weak to make it to that appointment -CTA A/P as below -Discussed with Dr. Donnetta Hutching, vascular surgery- consulted and appreciated.  Patient with stable type II endoleak.  He is to follow-up with Dr. Donnetta Hutching in 1 year.  Tobacco abuse -no intentions of quitting -Cotninue nicotine patch  CODE STATUS -Discussed with patient and son, currently DNI.  Moderate malnutrition -Nutrition consulted, continue supplements  DVT Prophylaxis  SCDs  Code Status: DNI  Family Communication: None at bedside  Disposition Plan: SNF. Will obtain repeat Thoracentesis today.    Consultants Gastroenterology Vascular surgery Interventional radiology Pulmonology, via phone Oncology, Dr. Earlie Server, made aware of admission  Procedures  EGD US guided thoracentesis x 2  Antibiotics   Anti-infectives (From admission, onward)   None      Subjective:   Tyler Mcmillan seen and examined today.  Continues to feel weak and wonders where this fluid is coming from. Continues to have cough and complain of leg swelling. Denies chest pain,  shortness of breath, abdominal pain, N/V/D/C.   Objective:   Vitals:   04/03/18 0951 04/03/18 1806 04/03/18 2056 04/04/18 0537  BP: 140/90 132/70 (!) 148/79 123/75  Pulse: 93 (!) 101 (!) 102 97  Resp: 18 18 19 20   Temp: (!) 97.5 F (36.4 C) 99.4 F (37.4 C) 98.8 F (37.1 C) 98.2 F (36.8 C)  TempSrc: Oral Oral Oral Oral  SpO2: 97% 96% 94% 94%  Weight:      Height:        Intake/Output Summary (Last 24 hours) at 04/04/2018 0815 Last data filed at 04/04/2018 0755 Gross per 24 hour  Intake 990 ml  Output 1200 ml  Net -210 ml   Filed Weights   03/31/18 2014 04/01/18 2135 04/02/18 2113  Weight: 78.1 kg 78.3 kg 78.2 kg   Exam  General: Well developed, chronically ill appearing, NAD  HEENT: NCAT, mucous membranes moist.   Neck: Supple  Cardiovascular: S1 S2 auscultated, RRR  Respiratory: Diminished on the left, otherwise clear. Occ cough  Abdomen: Soft, nontender, nondistended, + bowel sounds  Extremities: warm dry without cyanosis clubbing. ++LE edema  Neuro: AAOx3, nonfocal  Psych: Normal affect and demeanor  Data Reviewed: I have personally reviewed following labs and imaging studies  CBC: Recent Labs  Lab 03/30/18 1303 03/31/18 0519 04/01/18 0407 04/02/18 0421 04/03/18 0334 04/04/18 0554  WBC 8.4 4.8 4.7 5.7  --   --   HGB 9.2* 9.7* 9.1* 9.7* 9.1* 9.3*  HCT 29.9* 30.5* 28.5* 30.0* 28.0* 29.1*  MCV 112.0* 107.4* 108.4* 105.6*  --   --   PLT 302 243 263 259  --   --  Basic Metabolic Panel: Recent Labs  Lab 03/30/18 1303 03/31/18 0519 04/03/18 0334 04/04/18 0554  NA 137 138 135 135  K 4.7 3.8 3.7 3.6  CL 104 106 102 101  CO2 22 23 27 26   GLUCOSE 130* 87 94 90  BUN 15 8 5* 7*  CREATININE 1.02 0.90 0.80 0.93  CALCIUM 8.7* 8.2* 7.7* 8.0*   GFR: Estimated Creatinine Clearance: 74.1 mL/min (by C-G formula based on SCr of 0.93 mg/dL). Liver Function Tests: Recent Labs  Lab 03/30/18 1303 04/04/18 0554  AST 60* 33  ALT 36 20  ALKPHOS 115  79  BILITOT 0.2* 0.6  PROT 6.4* 5.4*  ALBUMIN 2.6* 2.1*   No results for input(s): LIPASE, AMYLASE in the last 168 hours. No results for input(s): AMMONIA in the last 168 hours. Coagulation Profile: No results for input(s): INR, PROTIME in the last 168 hours. Cardiac Enzymes: Recent Labs  Lab 03/30/18 1531  TROPONINI <0.03   BNP (last 3 results) No results for input(s): PROBNP in the last 8760 hours. HbA1C: No results for input(s): HGBA1C in the last 72 hours. CBG: No results for input(s): GLUCAP in the last 168 hours. Lipid Profile: No results for input(s): CHOL, HDL, LDLCALC, TRIG, CHOLHDL, LDLDIRECT in the last 72 hours. Thyroid Function Tests: No results for input(s): TSH, T4TOTAL, FREET4, T3FREE, THYROIDAB in the last 72 hours. Anemia Panel: No results for input(s): VITAMINB12, FOLATE, FERRITIN, TIBC, IRON, RETICCTPCT in the last 72 hours. Urine analysis:    Component Value Date/Time   COLORURINE YELLOW 03/31/2018 0630   APPEARANCEUR CLEAR 03/31/2018 0630   LABSPEC 1.042 (H) 03/31/2018 0630   PHURINE 5.0 03/31/2018 0630   GLUCOSEU NEGATIVE 03/31/2018 0630   HGBUR SMALL (A) 03/31/2018 0630   BILIRUBINUR NEGATIVE 03/31/2018 0630   KETONESUR NEGATIVE 03/31/2018 0630   PROTEINUR NEGATIVE 03/31/2018 0630   NITRITE NEGATIVE 03/31/2018 0630   LEUKOCYTESUR NEGATIVE 03/31/2018 0630   Sepsis Labs: @LABRCNTIP (procalcitonin:4,lacticidven:4)  ) Recent Results (from the past 240 hour(s))  Gram stain     Status: None   Collection Time: 03/31/18 12:26 PM  Result Value Ref Range Status   Specimen Description FLUID PLEURAL LEFT  Final   Special Requests NONE  Final   Gram Stain   Final    FEW WBC PRESENT, PREDOMINANTLY MONONUCLEAR NO ORGANISMS SEEN Performed at [facility], [place] [address], [place], [place] [address]    Report Status 03/31/2018 FINAL  Final  Culture, body fluid-bottle     Status: None (Preliminary result)   Collection Time: 03/31/18 12:26 PM   Result Value Ref Range Status   Specimen Description FLUID PLEURAL LEFT  Final   Special Requests NONE  Final   Culture   Final    NO GROWTH 3 DAYS Performed at [facility] [address] [place], [place] [address]    Report Status PENDING  Incomplete      Radiology Studies: Dg Chest 1 View  Result Date: 04/03/2018 CLINICAL DATA:  Status post left thoracentesis EXAM: CHEST  1 VIEW COMPARISON:  04/03/2018 FINDINGS: There has been interval mild decrease in pleural effusion following thoracentesis. Persistent left upper lobe mass lesion is seen. No definitive pneumothorax is seen. The right lung remains clear. Aortic calcifications are again noted. IMPRESSION: Slight decrease in left-sided pleural effusion. No evidence of pneumothorax. Electronically Signed   By: Inez Catalina M.D.   On: 04/03/2018 09:48   Dg Chest Port 1 View  Result Date: 04/03/2018 CLINICAL DATA:  Status post left thoracentesis. EXAM: PORTABLE CHEST 1 VIEW COMPARISON:  Radiograph of March 31, 2018. FINDINGS: Stable cardiomediastinal silhouette. Atherosclerosis of thoracic aorta is noted. Moderate left pleural effusion is noted with underlying atelectasis or infiltrate. No pneumothorax is noted. Stable left upper lobe mass is noted. Right lung is clear. Bony thorax is unremarkable. IMPRESSION: Stable moderate left pleural effusion is noted with underlying atelectasis or infiltrate. Stable left upper lobe mass is noted. Electronically Signed   By: Marijo Conception, M.D.   On: 04/03/2018 08:28   Ir Thoracentesis Asp Pleural Space W/img Guide  Result Date: 04/03/2018 INDICATION: Patient with history of lung cancer, left pleural effusion. Request is made for therapeutic thoracentesis. EXAM: ULTRASOUND GUIDED THERAPEUTIC LEFT THORACENTESIS MEDICATIONS: 10 mL 1% lidocaine COMPLICATIONS: None immediate. PROCEDURE: An ultrasound guided thoracentesis was thoroughly discussed with the patient and questions answered. The  benefits, risks, alternatives and complications were also discussed. The patient understands and wishes to proceed with the procedure. Written consent was obtained. Ultrasound was performed to localize and mark an adequate pocket of fluid in the left chest. The area was then prepped and draped in the normal sterile fashion. 1% Lidocaine was used for local anesthesia. Under ultrasound guidance a 6 Fr Safe-T-Centesis catheter was introduced. Thoracentesis was performed. The catheter was removed and a dressing applied. FINDINGS: A total of approximately 1.2 liters of amber fluid was removed. IMPRESSION: Successful ultrasound guided therapeutic left thoracentesis yielding 1.2 liters of pleural fluid. Read by: Brynda Greathouse PA-C Electronically Signed   By: Jacqulynn Cadet M.D.   On: 04/03/2018 10:13     Scheduled Meds: . sodium chloride   Intravenous Once  . feeding supplement (ENSURE ENLIVE)  237 mL Oral BID BM  . nicotine  14 mg Transdermal Daily  . pantoprazole  40 mg Oral BID   Continuous Infusions:    LOS: 4 days   Time Spent in minutes   30 minutes   Talita Recht D.O. on 04/04/2018 at 8:15 AM  Between 7am to 7pm - Please see pager noted on [email or web address]  After 7pm go to [email or web address]  And look for the night coverage person covering for me after hours  [facility] Office  [phone]

## 2018-04-05 ENCOUNTER — Telehealth: Payer: Self-pay | Admitting: Vascular Surgery

## 2018-04-05 DIAGNOSIS — J90 Pleural effusion, not elsewhere classified: Secondary | ICD-10-CM

## 2018-04-05 DIAGNOSIS — E440 Moderate protein-calorie malnutrition: Secondary | ICD-10-CM

## 2018-04-05 DIAGNOSIS — L8990 Pressure ulcer of unspecified site, unspecified stage: Secondary | ICD-10-CM

## 2018-04-05 LAB — PROTIME-INR
INR: 1.1 (ref 0.8–1.2)
PROTHROMBIN TIME: 13.9 s (ref 11.4–15.2)

## 2018-04-05 LAB — CULTURE, BODY FLUID W GRAM STAIN -BOTTLE: Culture: NO GROWTH

## 2018-04-05 LAB — BASIC METABOLIC PANEL
Anion gap: 8 (ref 5–15)
BUN: 8 mg/dL (ref 8–23)
CO2: 26 mmol/L (ref 22–32)
Calcium: 8.1 mg/dL — ABNORMAL LOW (ref 8.9–10.3)
Chloride: 101 mmol/L (ref 98–111)
Creatinine, Ser: 0.86 mg/dL (ref 0.61–1.24)
GFR calc Af Amer: >60 mL/min (ref >60)
GFR calc non Af Amer: >60 mL/min (ref >60)
Glucose, Bld: 93 mg/dL (ref 70–99)
Potassium: 3.4 mmol/L — ABNORMAL LOW (ref 3.5–5.1)
Sodium: 135 mmol/L (ref 135–145)

## 2018-04-05 LAB — HEMOGLOBIN AND HEMATOCRIT, BLOOD
HCT: 29.1 % — ABNORMAL LOW (ref 39.0–52.0)
Hemoglobin: 9.3 g/dL — ABNORMAL LOW (ref 13.0–17.0)

## 2018-04-05 LAB — APTT: aPTT: 41 seconds — ABNORMAL HIGH (ref 24–36)

## 2018-04-05 NOTE — Progress Notes (Signed)
[facility] PROGRESS NOTE    Progress Note  Tyler Mcmillan  ZDG:[id number] DOB: 1945/12/30 DOA: 03/30/2018 PCP: [name], MD     Brief Narrative:   Tyler Mcmillan is an 73 y.o. male past medical history of lung cancer, AAA despite stent placement back in 2019, anxiety who presents to the emergency room with complains of fatigue weakness dizziness dark stools for several months which has worsened over the last week.  He is not on anticoagulants, but he does endorse using Advil for pain.  Came into the hospital for frequent falls and unable to move.  Assessment/Plan:   Antibiotic anemia due to Acute GI bleeding/ Acute post-hemorrhagic anemia Hemoglobin on admission 9.2 back in 2018 and was 16. Status post 2 units of packed red blood cells. In a patient who endorses uses Advil GI was consulted. Empirically on IV Protonix, status post EGD 03/31/2018 that showed erythematous mucosa and a nonbleeding duodenal ulcer with no stigmata of bleeding. Session to Protonix twice daily.  Falls: Likely secondary to anemia versus cancer. Sickle therapy evaluated the patient the recommended skilled nursing facility.  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  Anxiety: Continue low-dose Ativan.  History of lung cancer with pleural effusion: Patient was seen with Dr. [name] a CTA showed a large pleural effusion with a low-density lesion in the left upper lobe at the side of the cavitary mass. Status post ultrasound-guided thoracocentesis yielded 1.3 L of amber fluid.  Cytology showed no malignant cells. Pete thoracocentesis on 02/01/2019 yielded 1.2 L of amber fluid. Repeated thoracocentesis on 02/02/2019 800 cc of fluid. Consult CT surgery for Pleurx catheter placement for recurrent symptomatic pleural effusion.  Acute dyspnea: Improved not hypoxia. Multifactorial in the setting of anemia and pleural effusion.  History of  AAA: CTA of the abdomen and pelvis was done, Dr. Truman Hayward discussed the case with the patient.  Tobacco abuse: Has no intention of quitting.  Malnutrition of moderate degree Ensure TID  Pressure injury of skin  RN Pressure Injury Documentation: Pressure Injury 03/30/18 Stage I -  Intact skin with non-blanchable redness of a localized area usually over a bony prominence. (Active)  03/30/18 2000  Location: Sacrum  Location Orientation:   Staging: Stage I -  Intact skin with non-blanchable redness of a localized area usually over a bony prominence.  Wound Description (Comments):   Present on Admission: Yes    Estimated body mass index is 24.74 kg/m as calculated from the following:   Height as of this encounter: 5\' 10"  (1.778 m).   Weight as of this encounter: 78.2 kg. Malnutrition Type:  Nutrition Problem: Moderate Malnutrition Etiology: chronic illness(Lung Cancer, possible Metastatic)   Malnutrition Characteristics:  Signs/Symptoms: percent weight loss, mild muscle depletion Percent weight loss: 10.5 %   Nutrition Interventions:  Interventions: Ensure Enlive (each supplement provides 350kcal and 20 grams of protein)  DVT prophylaxis: lovenox Family Communication:none Disposition Plan/Barrier to D/C: SNF onf pleural effusion w/u Code Status:     Code Status Orders  (From admission, onward)         Start     Ordered   04/03/18 0855  Limited resuscitation (code)  Continuous    Question Answer Comment  In the event of cardiac or respiratory ARREST: Initiate Code Blue, Call Rapid Response Yes   In the event of cardiac or respiratory ARREST: Perform CPR Yes   In the event of cardiac or respiratory ARREST: Perform Intubation/Mechanical Ventilation No  In the event of cardiac or respiratory ARREST: Use NIPPV/BiPAp only if indicated Yes   In the event of cardiac or respiratory ARREST: Administer ACLS medications if indicated Yes   In the event of cardiac or respiratory  ARREST: Perform Defibrillation or Cardioversion if indicated Yes      04/03/18 0854        Code Status History    Date Active Date Inactive Code Status Order ID Comments User Context   03/30/2018 1955 04/03/2018 0854 Full Code [id number]  Cristal Ford, DO Inpatient   09/08/2016 1602 09/09/2016 1520 Full Code [id number]  Orbie Hurst Inpatient   07/02/2016 1456 07/03/2016 1654 Full Code [id number]  Elwin Mocha, MD ED    Advance Directive Documentation     Most Recent Value  Type of Advance Directive  Living will, Healthcare Power of Attorney  Pre-existing out of facility DNR order (yellow form or pink MOST form)  -  "MOST" Form in Place?  -        IV Access:    Peripheral IV   Procedures and diagnostic studies:   Dg Chest 1 View  Result Date: 04/04/2018 CLINICAL DATA:  Pleural effusion.  Follow-up thoracentesis. EXAM: CHEST  1 VIEW COMPARISON:  04/03/2018 FINDINGS: Right chest remains clear. Slightly smaller pleural effusion on the left. Persistent atelectasis in the left lung. No sign of pneumothorax. IMPRESSION: Slightly less pleural fluid on the left following thoracentesis. No pneumothorax. Persistent atelectasis. Electronically Signed   By: Nelson Chimes M.D.   On: 04/04/2018 13:18   Dg Chest 1 View  Result Date: 04/03/2018 CLINICAL DATA:  Status post left thoracentesis EXAM: CHEST  1 VIEW COMPARISON:  04/03/2018 FINDINGS: There has been interval mild decrease in pleural effusion following thoracentesis. Persistent left upper lobe mass lesion is seen. No definitive pneumothorax is seen. The right lung remains clear. Aortic calcifications are again noted. IMPRESSION: Slight decrease in left-sided pleural effusion. No evidence of pneumothorax. Electronically Signed   By: Inez Catalina M.D.   On: 04/03/2018 09:48   Ir Thoracentesis Asp Pleural Space W/img Guide  Result Date: 04/04/2018 INDICATION: Patient with history of lung cancer, left pleural effusion. Request is  made for diagnostic and therapeutic thoracentesis. EXAM: ULTRASOUND GUIDED DIAGNOSTIC AND THERAPEUTIC LEFT THORACENTESIS MEDICATIONS: 10 mL 1% lidocaine COMPLICATIONS: None immediate. PROCEDURE: An ultrasound guided thoracentesis was thoroughly discussed with the patient and questions answered. The benefits, risks, alternatives and complications were also discussed. The patient understands and wishes to proceed with the procedure. Written consent was obtained. Ultrasound was performed to localize and mark an adequate pocket of fluid in the left chest. The area was then prepped and draped in the normal sterile fashion. 1% Lidocaine was used for local anesthesia. Under ultrasound guidance a 6 Fr Safe-T-Centesis catheter was introduced. Thoracentesis was performed. The catheter was removed and a dressing applied. FINDINGS: A total of approximately 800 mL of amber fluid was removed. Samples were sent to the laboratory as requested by the clinical team. IMPRESSION: Successful ultrasound guided diagnostic and therapeutic left thoracentesis yielding 800 mL of pleural fluid. Read by: Brynda Greathouse PA-C Electronically Signed   By: Aletta Edouard M.D.   On: 04/04/2018 13:04   Ir Thoracentesis Asp Pleural Space W/img Guide  Result Date: 04/03/2018 INDICATION: Patient with history of lung cancer, left pleural effusion. Request is made for therapeutic thoracentesis. EXAM: ULTRASOUND GUIDED THERAPEUTIC LEFT THORACENTESIS MEDICATIONS: 10 mL 1% lidocaine COMPLICATIONS: None immediate. PROCEDURE: An ultrasound guided thoracentesis was thoroughly  discussed with the patient and questions answered. The benefits, risks, alternatives and complications were also discussed. The patient understands and wishes to proceed with the procedure. Written consent was obtained. Ultrasound was performed to localize and mark an adequate pocket of fluid in the left chest. The area was then prepped and draped in the normal sterile fashion. 1%  Lidocaine was used for local anesthesia. Under ultrasound guidance a 6 Fr Safe-T-Centesis catheter was introduced. Thoracentesis was performed. The catheter was removed and a dressing applied. FINDINGS: A total of approximately 1.2 liters of amber fluid was removed. IMPRESSION: Successful ultrasound guided therapeutic left thoracentesis yielding 1.2 liters of pleural fluid. Read by: Brynda Greathouse PA-C Electronically Signed   By: Jacqulynn Cadet M.D.   On: 04/03/2018 10:13     Medical Consultants:    None.  Anti-Infectives:   NOne  Subjective:    Tyler Mcmillan he relates his breathing is better after thoracocentesis.  Objective:    Vitals:   04/04/18 0900 04/04/18 1649 04/04/18 2054 04/05/18 0601  BP: 132/80 (!) 152/86 132/80 (!) 141/81  Pulse: 90 99 99 93  Resp: (!) 2 18 18 18   Temp: 98 F (36.7 C) 98.5 F (36.9 C) 99.2 F (37.3 C) 98.6 F (37 C)  TempSrc: Oral Oral Oral Oral  SpO2: 96% 95% 95% 94%  Weight:      Height:        Intake/Output Summary (Last 24 hours) at 04/05/2018 0834 Last data filed at 04/05/2018 0201 Gross per 24 hour  Intake 744 ml  Output 1400 ml  Net -656 ml   Filed Weights   03/31/18 2014 04/01/18 2135 04/02/18 2113  Weight: 78.1 kg 78.3 kg 78.2 kg    Exam: General exam: In no acute distress. Respiratory system: Good air movement and clear to auscultation. Cardiovascular system: S1 & S2 heard, RRR. Gastrointestinal system: Abdomen is nondistended, soft and nontender.  Central nervous system: Alert and oriented. No focal neurological deficits. Extremities: No pedal edema. Skin: No rashes, lesions or ulcers Psychiatry: Judgement and insight appear normal. Mood & affect appropriate.    Data Reviewed:    Labs: Basic Metabolic Panel: Recent Labs  Lab 03/30/18 1303 03/31/18 0519 04/03/18 0334 04/04/18 0554 04/05/18 0419  NA 137 138 135 135 135  K 4.7 3.8 3.7 3.6 3.4*  CL 104 106 102 101 101  CO2 22 23 27 26 26   GLUCOSE 130*  87 94 90 93  BUN 15 8 5* 7* 8  CREATININE 1.02 0.90 0.80 0.93 0.86  CALCIUM 8.7* 8.2* 7.7* 8.0* 8.1*   GFR Estimated Creatinine Clearance: 80.2 mL/min (by C-G formula based on SCr of 0.86 mg/dL). Liver Function Tests: Recent Labs  Lab 03/30/18 1303 04/04/18 0554  AST 60* 33  ALT 36 20  ALKPHOS 115 79  BILITOT 0.2* 0.6  PROT 6.4* 5.4*  ALBUMIN 2.6* 2.1*   No results for input(s): LIPASE, AMYLASE in the last 168 hours. No results for input(s): AMMONIA in the last 168 hours. Coagulation profile No results for input(s): INR, PROTIME in the last 168 hours.  CBC: Recent Labs  Lab 03/30/18 1303 03/31/18 0519 04/01/18 0407 04/02/18 0421 04/03/18 0334 04/04/18 0554 04/05/18 0419  WBC 8.4 4.8 4.7 5.7  --   --   --   HGB 9.2* 9.7* 9.1* 9.7* 9.1* 9.3* 9.3*  HCT 29.9* 30.5* 28.5* 30.0* 28.0* 29.1* 29.1*  MCV 112.0* 107.4* 108.4* 105.6*  --   --   --   PLT 302  243 263 259  --   --   --    Cardiac Enzymes: Recent Labs  Lab 03/30/18 1531  TROPONINI <0.03   BNP (last 3 results) No results for input(s): PROBNP in the last 8760 hours. CBG: No results for input(s): GLUCAP in the last 168 hours. D-Dimer: No results for input(s): DDIMER in the last 72 hours. Hgb A1c: No results for input(s): HGBA1C in the last 72 hours. Lipid Profile: No results for input(s): CHOL, HDL, LDLCALC, TRIG, CHOLHDL, LDLDIRECT in the last 72 hours. Thyroid function studies: No results for input(s): TSH, T4TOTAL, T3FREE, THYROIDAB in the last 72 hours.  Invalid input(s): FREET3 Anemia work up: No results for input(s): VITAMINB12, FOLATE, FERRITIN, TIBC, IRON, RETICCTPCT in the last 72 hours. Sepsis Labs: Recent Labs  Lab 03/30/18 1303 03/31/18 0519 04/01/18 0407 04/02/18 0421  WBC 8.4 4.8 4.7 5.7   Microbiology Recent Results (from the past 240 hour(s))  Gram stain     Status: None   Collection Time: 03/31/18 12:26 PM  Result Value Ref Range Status   Specimen Description FLUID PLEURAL  LEFT  Final   Special Requests NONE  Final   Gram Stain   Final    FEW WBC PRESENT, PREDOMINANTLY MONONUCLEAR NO ORGANISMS SEEN Performed at [facility], [address] [place], [place] [address]    Report Status 03/31/2018 FINAL  Final  Culture, body fluid-bottle     Status: None (Preliminary result)   Collection Time: 03/31/18 12:26 PM  Result Value Ref Range Status   Specimen Description FLUID PLEURAL LEFT  Final   Special Requests NONE  Final   Culture   Final    NO GROWTH 4 DAYS Performed at [facility] [address] [place], [place] [address]    Report Status PENDING  Incomplete     Medications:   . sodium chloride   Intravenous Once  . feeding supplement (ENSURE ENLIVE)  237 mL Oral BID BM  . nicotine  14 mg Transdermal Daily  . pantoprazole  40 mg Oral BID   Continuous Infusions:    LOS: 5 days   Charlynne Cousins  [facility]  04/05/2018, 8:34 AM

## 2018-04-05 NOTE — Consult Note (Addendum)
[address]       [place] [address]             [phone]        Steve Felling [place] Medical Record #[id number] Date of Birth: [birth year]  Referring: No ref. provider found Primary Care: [name], MD Primary Cardiologist:No primary care provider on file.  Chief Complaint: Recurrent pleural effusion   History of Present Illness: Mr. Norberto Sorenson is a 73 year old male with a history of abdominal aortic aneurysm treated with a stent graft in 2018 by Dr. Donnetta Hutching, stage I squamous cell lung cancer diagnosed in 2018 treated with radiation therapy alone, history of bladder cancer, active daily tobacco smoker who presented to the hospital on 03/31/2018 with a primary complaint of black tarry stools.  He had a positive fecal occult blood test and was found to be anemic with hemoglobin of 9.2.  After admission, he was started on high-dose H2 blocker and the GI service was consulted.  He underwent EGD that demonstrated a nonbleeding duodenal ulcer.  There was erythematous gastric mucosa which was biopsied.  He has had no further evidence of active bleeding during hospitalization.  Initial admission chest x-ray showed a large left pleural effusion.  He went on to have a contrast CT scan of the chest, abdomen, and pelvis.  This study demonstrated a left upper lobe lesion at the site of the previous tumor which had been radiated.  Study also showed stability of the endovascular repair of the abdominal aortic aneurysm.  At the request of the primary service, interventional radiology proceeded with left thoracentesis on 03/31/2018 that yielded 1.3 L of serous fluid.  Cytology on the fluid showed no malignancy and Gram stain was negative.  He had recurrence of the effusion requiring repeat thoracentesis on 04/03/18 that again yielded 1.2 L of straw-colored fluid.  A third thoracentesis was carried out on 04/04/2018 with 800 mL drained.  Thoracentesis was interrupted prior to complete  drainage due to pain.  We have been asked to assess Mr. Fujiwara in regards to placement of a Pleurx catheter for his recurring left pleural effusion    Current Activity/ Functional Status:    Zubrod Score: At the time of surgery this patient's most appropriate activity status/level should be described as: []     0    Normal activity, no symptoms []     1    Restricted in physical strenuous activity but ambulatory, able to do out light work [x]     2    Ambulatory and capable of self care, unable to do work activities, up and about                 more than 50%  Of the time                            []     3    Only limited self care, in bed greater than 50% of waking hours []     4    Completely disabled, no self care, confined to bed or chair []     5    Moribund  Past Medical History:  Diagnosis Date  . AAA (abdominal aortic aneurysm) without rupture ([place])    [place] ABD.  DONE  JUNE 2014  3.5  . Bladder cancer ([place])   . Bladder neoplasm   . Chronic anxiety   . GERD (gastroesophageal reflux disease)   .  Hypertension   . Stage I squamous cell carcinoma of left lung ([place]) 08/09/2016    Past Surgical History:  Procedure Laterality Date  . ABDOMINAL AORTIC ENDOVASCULAR STENT GRAFT N/A 09/08/2016   Procedure: ABDOMINAL AORTIC ENDOVASCULAR STENT GRAFT;  Surgeon: [name], MD;  Location: [place];  Service: Vascular;  Laterality: N/A;  . BIOPSY  03/31/2018   Procedure: BIOPSY;  Surgeon: Wonda Horner, MD;  Location: [place];  Service: Endoscopy;;  . CYSTOSCOPY W/ URETERAL STENT PLACEMENT Left 10/04/2012   Procedure: CYSTOSCOPY WITH RETROGRADE PYELOGRAM/URETERAL STENT PLACEMENT   "POSSIBLE LEFT STENT";  Surgeon: Alexis Frock, MD;  Location: [facility];  Service: Urology;  Laterality: Left;  . ESOPHAGOGASTRODUODENOSCOPY (EGD) WITH PROPOFOL N/A 03/31/2018   Procedure: ESOPHAGOGASTRODUODENOSCOPY (EGD) WITH PROPOFOL;  Surgeon: Wonda Horner, MD;  Location: [facility] ENDOSCOPY;  Service:  Endoscopy;  Laterality: N/A;  . IR THORACENTESIS ASP PLEURAL SPACE W/IMG GUIDE  03/31/2018  . IR THORACENTESIS ASP PLEURAL SPACE W/IMG GUIDE  04/03/2018  . IR THORACENTESIS ASP PLEURAL SPACE W/IMG GUIDE  04/04/2018  . LEFT HEART CATH AND CORONARY ANGIOGRAPHY N/A 07/02/2016   Procedure: Left Heart Cath and Coronary Angiography;  Surgeon: Martinique, Peter M, MD;  Location: [place] [facility];  Service: Cardiovascular;  Laterality: N/A;  . MEATOTOMY N/A 10/04/2012   Procedure: MEATOTOMY ADULT;  Surgeon: Alexis Frock, MD;  Location: [facility];  Service: Urology;  Laterality: N/A;  . TRANSURETHRAL RESECTION OF BLADDER TUMOR WITH GYRUS (TURBT-GYRUS) N/A 10/04/2012   Procedure: TRANSURETHRAL RESECTION OF BLADDER TUMOR WITH GYRUS (TURBT-GYRUS);  Surgeon: Alexis Frock, MD;  Location[facility];  Service: Urology;  Laterality: N/A;  . VIDEO BRONCHOSCOPY Bilateral 07/19/2016   Procedure: VIDEO BRONCHOSCOPY WITH FLUORO;  Surgeon: Marshell Garfinkel, MD;  Location: [facility] ENDOSCOPY;  Service: Cardiopulmonary;  Laterality: Bilateral;    Social History   Tobacco Use  Smoking Status Current Every Day Smoker  . Packs/day: 0.50  . Years: 50.00  . Pack years: 25.00  . Types: Cigarettes  Smokeless Tobacco Never Used  Tobacco Comment   pt refuses info    Social History   Substance and Sexual Activity  Alcohol Use Yes  . Alcohol/week: 24.0 standard drinks  . Types: 10 Glasses of wine, 14 Cans of beer per week   Comment: few glases wine per day, beer     No Known Allergies  Current Facility-Administered Medications  Medication Dose Route Frequency Provider Last Rate Last Dose  . 0.9 %  sodium chloride infusion (Manually program via Guardrails IV Fluids)   Intravenous Once Anson Fret F, MD      . acetaminophen (TYLENOL) tablet 650 mg  650 mg Oral Q6H PRN Wonda Horner, MD       Or  . acetaminophen (TYLENOL) suppository 650 mg  650 mg Rectal Q6H PRN Wonda Horner, MD        . feeding supplement (ENSURE ENLIVE) (ENSURE ENLIVE) liquid 237 mL  237 mL Oral BID BM Mikhail, Maryann, DO   237 mL at 04/05/18 1038  . lidocaine (XYLOCAINE) 1 % (with pres) injection   Infiltration PRN Docia Barrier, PA   10 mL at 04/03/18 0921  . LORazepam (ATIVAN) injection 0.5 mg  0.5 mg Intravenous Q6H PRN Anson Fret F, MD   0.5 mg at 04/05/18 0021  . morphine 2 MG/ML injection 2 mg  2 mg Intravenous Q4H PRN Wonda Horner, MD   2 mg at 04/04/18 2200  .  nicotine (NICODERM CQ - dosed in mg/24 hours) patch 14 mg  14 mg Transdermal Daily Anson Fret F, MD   14 mg at 04/05/18 1038  . ondansetron (ZOFRAN) tablet 4 mg  4 mg Oral Q6H PRN Wonda Horner, MD       Or  . ondansetron [facility]) injection 4 mg  4 mg Intravenous Q6H PRN Anson Fret F, MD      . oxyCODONE (Oxy IR/ROXICODONE) immediate release tablet 5 mg  5 mg Oral Q4H PRN Cristal Ford, DO   5 mg at 04/05/18 7741  . pantoprazole (PROTONIX) EC tablet 40 mg  40 mg Oral BID Cristal Ford, DO   40 mg at 04/05/18 1040    Medications Prior to Admission  Medication Sig Dispense Refill Last Dose  . clonazePAM (KLONOPIN) 0.5 MG tablet Take 0.5 mg by mouth 2 (two) times daily.    03/29/2018 at Unknown time  . ibuprofen (ADVIL,MOTRIN) 200 MG tablet Take 400 mg by mouth daily as needed (for pain).    unk at unk    Family History  Problem Relation Age of Onset  . Cancer Mother   . Heart disease Father        before age 40  . Heart attack Father   . Cancer Brother      Review of Systems:   ROS    Cardiac Review of Systems: Y or  [    ]= no  Chest Pain [  y  ]  Resting SOB [  y ] Exertional SOB  Blue.Reese  ]  Orthopnea [  ]   Pedal Edema [x   ]    Palpitations [  ] Syncope  [  ]   Presyncope [   ]  General Review of Systems: [Y] = yes [  ]=no Constitional: recent weight change [  ]; anorexia [  ]; fatigue [  ]; nausea [  ]; night sweats [  ]; fever [  ]; or chills [  ]                                                                Dental: Last Dentist visit:   Eye : blurred vision [  ]; diplopia [   ]; vision changes [  ];  Amaurosis fugax[  ]; Resp: cough [  ];  wheezing[  ];  hemoptysis[  ]; shortness of breath[ y ]; paroxysmal nocturnal dyspnea[  ]; dyspnea on exertion[  ]; or orthopnea[  ];  GI:  gallstones[  ], vomiting[  ];  dysphagia[  ]; melena[y  ];  hematochezia [  ]; heartburn[  ];   Hx of  Colonoscopy[  ]; GU: kidney stones [  ]; hematuria[  ];   dysuria [  ];  nocturia[  ];  history of     obstruction [  ]; urinary frequency [  ]             Skin: rash, swelling[  ];, hair loss[  ];  peripheral edema[  ];  or itching[  ]; Musculosketetal: myalgias[  ];  joint swelling[  ];  joint erythema[  ];  joint pain[  ];  back pain[  ];  Heme/Lymph: bruising[  ];  bleeding[  ];  anemia[  ];  Neuro: TIA[  ];  headaches[  ];  stroke[  ];  vertigo[  ];  seizures[  ];   paresthesias[  ];  difficulty walking[  ];  Psych:depression[  ]; anxiety[x  ];  Endocrine: diabetes[  ];  thyroid dysfunction[  ];              Physical Exam: BP 116/68 (BP Location: Left Arm)   Pulse 100   Temp 98.9 F (37.2 C) (Oral)   Resp 18   Ht 5\' 10"  (1.778 m)   Wt 78.2 kg   SpO2 97%   BMI 24.74 kg/m    General appearance: alert, cooperative, appears stated age and no distress Head: Normocephalic, without obvious abnormality, atraumatic Neck: no adenopathy, no carotid bruit, no JVD, supple, symmetrical, trachea midline and thyroid not enlarged, symmetric, no tenderness/mass/nodules Lymph nodes: Cervical, supraclavicular, and axillary nodes normal. Resp: clear to auscultation bilaterally, diminished left base Cardio: regular rate and rhythm, S1, S2 normal, no murmur, click, rub or gallop GI: soft, non-tender; bowel sounds normal; no masses,  no organomegaly Extremities: extremities normal, atraumatic, no cyanosis or edema Neurologic: Alert and oriented X 3, normal strength and tone.  No gross deficits  Diagnostic Studies &  Laboratory data:     Recent Radiology Findings:   EXAM: CT ANGIOGRAPHY CHEST, ABDOMEN AND PELVIS  TECHNIQUE: Multidetector CT imaging through the chest, abdomen and pelvis was performed using the standard protocol during bolus administration of intravenous contrast. Multiplanar reconstructed images and MIPs were obtained and reviewed to evaluate the vascular anatomy.  CONTRAST:  118mL ISOVUE-370 IOPAMIDOL (ISOVUE-370) INJECTION 76%  COMPARISON:  Radiograph 03/30/2018, 05/16/2017, CT chest 04/21/2017, 12/08/2016, CT angiogram 10/19/2016  FINDINGS: CTA CHEST FINDINGS  Cardiovascular: Non contrasted images of the chest demonstrate moderate aortic atherosclerosis. No aneurysmal dilatation. Coronary vascular calcification. Normal heart size. Trace pericardial effusion.  Following contrast, no dissection is seen. Central pulmonary arteries are patent.  Mediastinum/Nodes: Midline trachea. No thyroid mass. No significant adenopathy. Esophagus within normal limits.  Lungs/Pleura: Large left-sided pleural effusion. Moderate emphysema. Right lung is clear.  Partial atelectasis in the left lower lobe. Cystic density, corresponding to radiographic abnormality in the left upper lobe, this measures 2.9 x 2.9 cm, previously 3.1 x 3.4 cm and is in the region of the previously demonstrated cavitary lesion. Scarring in the left upper lobe. No other suspicious pulmonary nodules.  Musculoskeletal: No acute acute or suspicious abnormality.  Review of the MIP images confirms the above findings.  CTA ABDOMEN AND PELVIS FINDINGS  VASCULAR  Aorta: Moderate aortic atherosclerosis. Status post endovascular repair of infrarenal abdominal aortic aneurysm just below the renal arteries to the bilateral common iliac arteries. There is graft patency. Residual aneurysm sac measures 5.8 x 6 cm as compared with 5.7 x 6 cm previously. Small enhancing vessels are again noted within the  aneurysm sac.  Celiac: Calcification at the origin without significant stenosis.  SMA: Mild stenosis at the origin.  Renals: Single right renal artery and 2 left renal arteries including a small accessory artery to the upper pole. Moderate focal stenosis at the origin of the dominant left renal artery as before.  IMA: Not well opacified at the origin.  Inflow: Both internal iliac arteries are patent. Atherosclerotic disease of the external iliac arteries.  Veins: No obvious venous abnormality within the limitations of this arterial phase study.  Review of the MIP images confirms the above findings.  NON-VASCULAR  Hepatobiliary: Slightly nodular liver contour, possible cirrhosis.  Calcified stone at the gallbladder neck. No biliary dilatation  Pancreas: Unremarkable. No pancreatic ductal dilatation or surrounding inflammatory changes.  Spleen: Within normal limits.  Adrenals/Urinary Tract: Adrenal glands are unremarkable. Kidneys are normal, without renal calculi, focal lesion, or hydronephrosis. Bladder is unremarkable.  Stomach/Bowel: The stomach is within normal limits. No dilated small bowel. No colon wall thickening.  Lymphatic: No significantly enlarged lymph nodes.  Reproductive: Slightly heterogeneous enhancement of the prostate with calcifications.  Other: No free air. Small free fluid in the pelvis. Fat containing right greater than left inguinal hernia with possible small fluid in the right inguinal region. 2.4 cm subcutaneous cyst in the right gluteal region.  Musculoskeletal: Acute to subacute fracture involving the L1 vertebral body with close to 50% loss of height anteriorly. No significant retropulsion. Trace anterolisthesis L4 on L5.  Review of the MIP images confirms the above findings.  IMPRESSION: 1. Status post endovascular repair of distal infrarenal abdominal aortic aneurysm. The excluded aneurysm sac is unchanged in size  as compared with 10/19/2016 and measures 5.8 x 6 cm. Arterial phase enhancement is again noted within the excluded sac, consistent with type 2 endoleak. 2. Large left-sided pleural effusion. Low-density lesion in the left upper lobe at the site of prior cavitary mass has further decreased in size and is felt to correspond to the radiographic nodule. No other suspicious lung nodules are seen. 3. Slightly nodular liver contour, possible cirrhosis 4. Gallstone 5. Heterogeneous enhancement of the prostate gland with calcification. 6. Small free fluid in the pelvis 7. Acute to subacute fracture involving L1 vertebral body with close to 50% loss of height anteriorly 8. No definite CT evidence for metastatic disease to the abdomen or pelvis   Electronically Signed   By: Donavan Foil M.D.   On: 03/30/2018 23:06    Dg Chest 1 View  Result Date: 04/04/2018 CLINICAL DATA:  Pleural effusion.  Follow-up thoracentesis. EXAM: CHEST  1 VIEW COMPARISON:  04/03/2018 FINDINGS: Right chest remains clear. Slightly smaller pleural effusion on the left. Persistent atelectasis in the left lung. No sign of pneumothorax. IMPRESSION: Slightly less pleural fluid on the left following thoracentesis. No pneumothorax. Persistent atelectasis. Electronically Signed   By: Nelson Chimes M.D.   On: 04/04/2018 13:18   Ir Thoracentesis Asp Pleural Space W/img Guide  Result Date: 04/04/2018 INDICATION: Patient with history of lung cancer, left pleural effusion. Request is made for diagnostic and therapeutic thoracentesis. EXAM: ULTRASOUND GUIDED DIAGNOSTIC AND THERAPEUTIC LEFT THORACENTESIS MEDICATIONS: 10 mL 1% lidocaine COMPLICATIONS: None immediate. PROCEDURE: An ultrasound guided thoracentesis was thoroughly discussed with the patient and questions answered. The benefits, risks, alternatives and complications were also discussed. The patient understands and wishes to proceed with the procedure. Written consent was  obtained. Ultrasound was performed to localize and mark an adequate pocket of fluid in the left chest. The area was then prepped and draped in the normal sterile fashion. 1% Lidocaine was used for local anesthesia. Under ultrasound guidance a 6 Fr Safe-T-Centesis catheter was introduced. Thoracentesis was performed. The catheter was removed and a dressing applied. FINDINGS: A total of approximately 800 mL of amber fluid was removed. Samples were sent to the laboratory as requested by the clinical team. IMPRESSION: Successful ultrasound guided diagnostic and therapeutic left thoracentesis yielding 800 mL of pleural fluid. Read by: Brynda Greathouse PA-C Electronically Signed   By: Aletta Edouard M.D.   On: 04/04/2018 13:04     I have independently reviewed the above radiologic studies and discussed  with the patient   Recent Lab Findings: Lab Results  Component Value Date   WBC 5.7 04/02/2018   HGB 9.3 (L) 04/05/2018   HCT 29.1 (L) 04/05/2018   PLT 259 04/02/2018   GLUCOSE 93 04/05/2018   ALT 20 04/04/2018   AST 33 04/04/2018   NA 135 04/05/2018   K 3.4 (L) 04/05/2018   CL 101 04/05/2018   CREATININE 0.86 04/05/2018   BUN 8 04/05/2018   CO2 26 04/05/2018   INR 1.07 11/20/2016      Assessment / Plan:      Pleasant 73 year old male with multiple medical problems described above including both history of lung cancer and bladder cancer initially presenting with melena and anemia incidentally found to have large left pleural effusion.  This is recurred rapidly despite thoracentesis x3 over the previous 5 days.  Initial cytology and Gram stain are negative but given his prior history, this is certainly suspicious for a malignant effusion.  Echocardiography during this admission does not show any evidence of significant pulmonary hypertension.  Placement of a Pleurx catheter is indicated for future management of this recurrent left pleural effusion.  The procedure is discussed with the patient and  his questions were answered.  He would like to proceed.  We will tentatively schedule for placement of a left Pleurx catheter in the operating room on Friday, 04/07/2018. His current code status is DNI.    I  spent 20 minutes counseling the patient face to face.   Antony Odea, PA-C 04/05/2018 11:22 AM   Patent known to me has been seen and examined and X-rays reviewed. Discussed placement of Plurix on left  with the patient. Will obtain chest xray in am and plan to proceed with pleurix on Friday if evidence of fluid present. Patient s questions answered and he is will to proceed.  Grace Isaac MD      [address] [place] [address] Office [phone]   [place]

## 2018-04-05 NOTE — Progress Notes (Signed)
Physical Therapy Treatment Patient Details Name: Tyler Mcmillan MRN: [id number] DOB: Jun 20, 1945 Today's Date: 04/05/2018    History of Present Illness Pt is a 73 y/o male with a PMH significant for lung CA, AAA, anxiety who presents s/p fatigue, weakness, dizziness, dark stools and frequent falls for several months, acutely worsening in the last week. Pt has undergone thoracentesis and EGD.     PT Comments    Pt progressing towards physical therapy goals. Noted pt with strong bodily odor from poor peri-care however pt refusing any ADL tasks to clean himself and would not let therapist assist. Overall pt mobilizing without assistance but overall safety awareness is poor. SNF remains appropriate. Will continue to follow and progress as able per POC.   Follow Up Recommendations  SNF     Equipment Recommendations  3in1 (PT)    Recommendations for Other Services       Precautions / Restrictions Precautions Precautions: Fall Restrictions Weight Bearing Restrictions: No    Mobility  Bed Mobility Overal bed mobility: Modified Independent             General bed mobility comments: no assist required for supine<>sit.  Transfers Overall transfer level: Needs assistance Equipment used: Rolling walker (2 wheeled) Transfers: Sit to/from Stand Sit to Stand: Supervision         General transfer comment: Close supervision for power-up to full standing. Pt appeared to be rushing but no overt LOB noted.   Ambulation/Gait Ambulation/Gait assistance: Min assist Gait Distance (Feet): 200 Feet Assistive device: Rolling walker (2 wheeled) Gait Pattern/deviations: Decreased stride length;Trunk flexed;Narrow base of support;Shuffle Gait velocity: Decreased Gait velocity interpretation: <1.8 ft/sec, indicate of risk for recurrent falls General Gait Details: heavy reliance on RW for upright support, some instability noted. VCs for upright posture and increased cadence   Stairs              Wheelchair Mobility    Modified Rankin (Stroke Patients Only)       Balance Overall balance assessment: Needs assistance Sitting-balance support: Feet supported;No upper extremity supported Sitting balance-Leahy Scale: Fair     Standing balance support: During functional activity Standing balance-Leahy Scale: Poor Standing balance comment: Supervision in static standing with use of RW for external support                            Cognition Arousal/Alertness: Awake/alert Behavior During Therapy: WFL for tasks assessed/performed Overall Cognitive Status: Impaired/Different from baseline Area of Impairment: Following commands;Safety/judgement;Awareness;Problem solving                   Current Attention Level: Selective   Following Commands: Follows one step commands with increased time Safety/Judgement: Decreased awareness of safety;Decreased awareness of deficits Awareness: Emergent Problem Solving: Requires verbal cues        Exercises      General Comments        Pertinent Vitals/Pain Pain Assessment: Faces Faces Pain Scale: Hurts a little bit Pain Location: L ribs, back  Pain Descriptors / Indicators: Discomfort;Sore Pain Intervention(s): Monitored during session    Home Living                      Prior Function            PT Goals (current goals can now be found in the care plan section) Acute Rehab PT Goals Patient Stated Goal: "Get all the fluid out" PT Goal Formulation:  With patient/family Time For Goal Achievement: 04/07/18 Potential to Achieve Goals: Good Progress towards PT goals: Progressing toward goals    Frequency    Min 2X/week      PT Plan Discharge plan needs to be updated    Co-evaluation              AM-PAC PT "6 Clicks" Mobility   Outcome Measure  Help needed turning from your back to your side while in a flat bed without using bedrails?: None Help needed moving from lying  on your back to sitting on the side of a flat bed without using bedrails?: None Help needed moving to and from a bed to a chair (including a wheelchair)?: A Little Help needed standing up from a chair using your arms (e.g., wheelchair or bedside chair)?: A Little Help needed to walk in hospital room?: A Little Help needed climbing 3-5 steps with a railing? : A Lot 6 Click Score: 19    End of Session Equipment Utilized During Treatment: Gait belt Activity Tolerance: Patient tolerated treatment well Patient left: in bed;with call bell/phone within reach;with bed alarm set Nurse Communication: Mobility status PT Visit Diagnosis: Unsteadiness on feet (R26.81);Difficulty in walking, not elsewhere classified (R26.2);Muscle weakness (generalized) (M62.81)     Time: 1351-1410 PT Time Calculation (min) (ACUTE ONLY): 19 min  Charges:  $Gait Training: 8-22 mins                     [name], PT, DPT Acute Rehabilitation Services Pager: [phone] Office: [phone]    [name] 04/05/2018, 2:22 PM

## 2018-04-05 NOTE — Clinical Social Work Placement (Signed)
   CLINICAL SOCIAL WORK PLACEMENT  NOTE *04/05/18 - PATIENT PROVIDED WITH FACILITY RESPONSES  Date:  04/05/2018  Patient Details  Name: Tyler Mcmillan MRN: [id number] Date of Birth: 1945/07/31  Clinical Social Work is seeking post-discharge placement for this patient at the [place] level of care (*CSW will initial, date and re-position this form in  chart as items are completed):  Yes   Patient/family provided with [facility]'s list of facilities offering this level of care within the geographic area requested by the patient (or if unable, by the patient's family).  Yes   Patient/family informed of their freedom to choose among providers that offer the needed level of care, that participate in Medicare, Medicaid or managed care program needed by the patient, have an available bed and are willing to accept the patient.  Yes   Patient/family informed of [facility]'s ownership interest in [facility] and [facility], as well as of the fact that they are under no obligation to receive care at these facilities.  PASRR submitted to EDS on 04/04/18     PASRR number received on 04/04/18     Existing PASRR number confirmed on       FL2 transmitted to all facilities in geographic area requested by pt/family on 04/04/18     FL2 transmitted to all facilities within larger geographic area on       Patient informed that his/her managed care company has contracts with or will negotiate with certain facilities, including the following:        Yes   Patient/family informed of bed offers received.  Patient chooses bed at       Physician recommends and patient chooses bed at      Patient to be transferred to   on  .  Patient to be transferred to facility by       Patient family notified on   of transfer.  Name of family member notified:        PHYSICIAN       Additional Comment:     _______________________________________________ Sable Feil, LCSW 04/05/2018, 11:32 AM

## 2018-04-05 NOTE — Telephone Encounter (Signed)
Called pt several times and left vm to schedule his 1 yr f/u with CT AB/Pel. No return phone calls

## 2018-04-06 ENCOUNTER — Inpatient Hospital Stay (HOSPITAL_COMMUNITY): Payer: Medicare Other

## 2018-04-06 DIAGNOSIS — J9621 Acute and chronic respiratory failure with hypoxia: Secondary | ICD-10-CM

## 2018-04-06 MED ORDER — POLYETHYLENE GLYCOL 3350 17 G PO PACK
17.0000 g | PACK | Freq: Every day | ORAL | Status: AC
  Administered 2018-04-06 – 2018-04-07 (×2): 17 g via ORAL
  Filled 2018-04-06: qty 1

## 2018-04-06 MED ORDER — POLYETHYLENE GLYCOL 3350 17 G PO PACK
17.0000 g | PACK | Freq: Every day | ORAL | Status: DC
  Filled 2018-04-06: qty 1

## 2018-04-06 NOTE — Progress Notes (Signed)
Patient ID: Tyler Mcmillan, male   DOB: 12/05/1945, 73 y.o.   MRN: [id number]      [address]       [place] [address]             [phone]    Patient's chest x-ray reviewed today with some evidence of persistent left pleural effusion, appears adequate enough to be able to place a Pleurx tomorrow. I discussed the risks and options with the patient and factors involved with the care of a Pleurx catheter.  He is agreeable to proceed tomorrow.   The goals risks and alternatives of the planned surgical procedure Procedure(s): ESOPHAGOGASTRODUODENOSCOPY (EGD) WITH PROPOFOL (N/A) BIOPSY  have been discussed with the patient in detail. The risks of the procedure including death, infection, stroke, myocardial infarction, bleeding, blood transfusion have all been discussed specifically.  I have quoted Darcus Austin a 1 % of perioperative mortality and a complication rate as high as  10%. The patient's questions have been answered.Dave Mergen is willing  to proceed with the planned procedure.

## 2018-04-06 NOTE — Anesthesia Preprocedure Evaluation (Addendum)
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 The left ventricle has hyperdynamic systolic function, with an ejection fraction of >65%. The cavity size was normal. There is mildly increased left ventricular wall thickness. Left ventricular diastolic Doppler parameters are indeterminate  Indeterminent filling pressures. 2. The right ventricle has normal systolic function. The cavity was normal. There is no increase in right ventricular wall thickness. 3. The mitral valve is normal in structure. Mild thickening of the mitral valve leaflet. 4. The tricuspid valve is normal in structure. 5. The aortic valve is tricuspid Mild thickening of the aortic valve Mild calcification of the aortic valve.     Neuro/Psych Anxiety negative neurological ROS     GI/Hepatic Neg liver ROS, GERD  Medicated and Controlled,  Endo/Other  negative endocrine ROS  Renal/GU negative Renal ROS     Musculoskeletal negative musculoskeletal ROS (+)   Abdominal   Peds  Hematology  (+) anemia ,   Anesthesia Other Findings RECURRENT LEFT PLEURAL EFFUSION  Reproductive/Obstetrics                            Anesthesia Physical Anesthesia Plan  ASA: III  Anesthesia Plan: MAC   Post-op Pain Management:    Induction: Intravenous  PONV Risk Score and Plan: 0 and Propofol infusion and Treatment may vary due to age or  medical condition  Airway Management Planned: Simple Face Mask  Additional Equipment:   Intra-op Plan:   Post-operative Plan:   Informed Consent: I have reviewed the patients History and Physical, chart, labs and discussed the procedure including the risks, benefits and alternatives for the proposed anesthesia with the patient or authorized representative who has indicated his/her understanding and acceptance.     Dental advisory given  Plan Discussed with: CRNA  Anesthesia Plan Comments:        Anesthesia Quick Evaluation

## 2018-04-06 NOTE — Progress Notes (Signed)
OT Cancellation Note  Patient Details Name: Tyler Mcmillan MRN: [id number] DOB: 07-04-1945   Cancelled Treatment:    Reason Eval/Treat Not Completed: Other (comment). Pt asleep in bed upon OT arrival. Reports fatigue from early morning imaging, "you've caught me at a bad time." Plan to reattempt as able.  Tyrone Schimke, OT Acute Rehabilitation Services Pager: [phone] Office: [phone]  04/06/2018, 11:08 AM

## 2018-04-06 NOTE — Progress Notes (Signed)
[facility] PROGRESS NOTE    Progress Note  Tyler Mcmillan  DZH:[id number] DOB: [birth year] DOA: 03/30/2018 PCP: [name], MD     Brief Narrative:   Tyler Mcmillan is an 73 y.o. male past medical history of lung cancer, AAA despite stent placement back in 2019, anxiety who presents to the emergency room with complains of fatigue weakness dizziness dark stools for several months which has worsened over the last week.  He is not on anticoagulants, but he does endorse using Advil for pain.  Came into the hospital for frequent falls and unable to move.  Assessment/Plan:   Antibiotic anemia due to Acute GI bleeding/ Acute post-hemorrhagic anemia Hemoglobin on admission 9.2 back in 2018 and was 16. Status post 2 units of packed red blood cells. Patient who endorses uses Advil GI was consulted. Empirically on IV Protonix, status post EGD 03/31/2018 that showed erythematous mucosa and a nonbleeding duodenal ulcer with no stigmata of bleeding. Session to Protonix twice daily.  Falls: Likely secondary to anemia versus cancer. Physical therapy evaluated the patient the recommended skilled nursing facility.  Chronic pain: No changes made to his medication.  Anxiety: Continue low-dose Ativan.  History of lung cancer with pleural effusion: CTA showed a large pleural effusion with a low-density lesion in the left upper lobe at the side of the cavitary mass. CT surgery was consulted and recommended a Pleurx catheter.  Chest x-ray showed recurrent left effusion. For Pleurx catheter on 04/07/2018  Acute respiratory failure with hypoxia due to left pleural effusion Improved not hypoxia. Multifactorial in the setting of anemia and pleural effusion.  History of AAA: CTA of the abdomen and pelvis was done, Dr. Truman Hayward discussed the case with the patient.  Tobacco abuse: Has no intention of quitting.  Malnutrition of moderate degree Ensure TID  Pressure injury of skin  RN Pressure  Injury Documentation: Pressure Injury 03/30/18 Stage I -  Intact skin with non-blanchable redness of a localized area usually over a bony prominence. (Active)  03/30/18 2000  Location: Sacrum  Location Orientation:   Staging: Stage I -  Intact skin with non-blanchable redness of a localized area usually over a bony prominence.  Wound Description (Comments):   Present on Admission: Yes    Estimated body mass index is 24.74 kg/m as calculated from the following:   Height as of this encounter: 5\' 10"  (1.778 m).   Weight as of this encounter: 78.2 kg. Malnutrition Type:  Nutrition Problem: Moderate Malnutrition Etiology: chronic illness(Lung Cancer, possible Metastatic)   Malnutrition Characteristics:  Signs/Symptoms: percent weight loss, mild muscle depletion Percent weight loss: 10.5 %   Nutrition Interventions:  Interventions: Ensure Enlive (each supplement provides 350kcal and 20 grams of protein)  DVT prophylaxis: lovenox Family Communication:none Disposition Plan/Barrier to D/C: SNF onf pleural effusion w/u Code Status:     Code Status Orders  (From admission, onward)         Start     Ordered   04/03/18 0855  Limited resuscitation (code)  Continuous    Question Answer Comment  In the event of cardiac or respiratory ARREST: Initiate Code Blue, Call Rapid Response Yes   In the event of cardiac or respiratory ARREST: Perform CPR Yes   In the event of cardiac or respiratory ARREST: Perform Intubation/Mechanical Ventilation No   In the event of cardiac or respiratory ARREST: Use NIPPV/BiPAp only if indicated Yes   In the event of cardiac or respiratory ARREST: Administer ACLS medications if indicated Yes  In the event of cardiac or respiratory ARREST: Perform Defibrillation or Cardioversion if indicated Yes      04/03/18 0854        Code Status History    Date Active Date Inactive Code Status Order ID Comments User Context   03/30/2018 1955 04/03/2018 0854 Full  Code [id number]  Cristal Ford, DO Inpatient   09/08/2016 1602 09/09/2016 1520 Full Code [id number]  Orbie Hurst Inpatient   07/02/2016 1456 07/03/2016 1654 Full Code [id number]  Elwin Mocha, MD ED    Advance Directive Documentation     Most Recent Value  Type of Advance Directive  Living will, Healthcare Power of Attorney  Pre-existing out of facility DNR order (yellow form or pink MOST form)  -  "MOST" Form in Place?  -        IV Access:    Peripheral IV   Procedures and diagnostic studies:   Dg Chest 1 View  Result Date: 04/04/2018 CLINICAL DATA:  Pleural effusion.  Follow-up thoracentesis. EXAM: CHEST  1 VIEW COMPARISON:  04/03/2018 FINDINGS: Right chest remains clear. Slightly smaller pleural effusion on the left. Persistent atelectasis in the left lung. No sign of pneumothorax. IMPRESSION: Slightly less pleural fluid on the left following thoracentesis. No pneumothorax. Persistent atelectasis. Electronically Signed   By: Nelson Chimes M.D.   On: 04/04/2018 13:18   Ir Thoracentesis Asp Pleural Space W/img Guide  Result Date: 04/04/2018 INDICATION: Patient with history of lung cancer, left pleural effusion. Request is made for diagnostic and therapeutic thoracentesis. EXAM: ULTRASOUND GUIDED DIAGNOSTIC AND THERAPEUTIC LEFT THORACENTESIS MEDICATIONS: 10 mL 1% lidocaine COMPLICATIONS: None immediate. PROCEDURE: An ultrasound guided thoracentesis was thoroughly discussed with the patient and questions answered. The benefits, risks, alternatives and complications were also discussed. The patient understands and wishes to proceed with the procedure. Written consent was obtained. Ultrasound was performed to localize and mark an adequate pocket of fluid in the left chest. The area was then prepped and draped in the normal sterile fashion. 1% Lidocaine was used for local anesthesia. Under ultrasound guidance a 6 Fr Safe-T-Centesis catheter was introduced. Thoracentesis was performed.  The catheter was removed and a dressing applied. FINDINGS: A total of approximately 800 mL of amber fluid was removed. Samples were sent to the laboratory as requested by the clinical team. IMPRESSION: Successful ultrasound guided diagnostic and therapeutic left thoracentesis yielding 800 mL of pleural fluid. Read by: Brynda Greathouse PA-C Electronically Signed   By: Aletta Edouard M.D.   On: 04/04/2018 13:04     Medical Consultants:    None.  Anti-Infectives:   NOne  Subjective:    Amire Leazer is feeling short of breath today.  Objective:    Vitals:   04/05/18 0833 04/05/18 1726 04/05/18 1936 04/06/18 0531  BP: 116/68 119/78 133/71 136/77  Pulse: 100 94 93 92  Resp: 18 18 18 20   Temp: 98.9 F (37.2 C) 98.6 F (37 C) 98.7 F (37.1 C) 98.9 F (37.2 C)  TempSrc: Oral Oral Oral Oral  SpO2: 97% 95% 96% 95%  Weight:      Height:        Intake/Output Summary (Last 24 hours) at 04/06/2018 0813 Last data filed at 04/06/2018 0601 Gross per 24 hour  Intake 1077 ml  Output -  Net 1077 ml   Filed Weights   03/31/18 2014 04/01/18 2135 04/02/18 2113  Weight: 78.1 kg 78.3 kg 78.2 kg    Exam: General exam: In no acute distress. Respiratory  system: Good air movement and clear to auscultation. Cardiovascular system: S1 & S2 heard, RRR. Gastrointestinal system: Abdomen is nondistended, soft and nontender.  Central nervous system: Alert and oriented. No focal neurological deficits. Extremities: No pedal edema. Skin: No rashes, lesions or ulcers Psychiatry: Judgement and insight appear normal. Mood & affect appropriate.    Data Reviewed:    Labs: Basic Metabolic Panel: Recent Labs  Lab 03/30/18 1303 03/31/18 0519 04/03/18 0334 04/04/18 0554 04/05/18 0419  NA 137 138 135 135 135  K 4.7 3.8 3.7 3.6 3.4*  CL 104 106 102 101 101  CO2 22 23 27 26 26   GLUCOSE 130* 87 94 90 93  BUN 15 8 5* 7* 8  CREATININE 1.02 0.90 0.80 0.93 0.86  CALCIUM 8.7* 8.2* 7.7* 8.0* 8.1*    GFR Estimated Creatinine Clearance: 80.2 mL/min (by C-G formula based on SCr of 0.86 mg/dL). Liver Function Tests: Recent Labs  Lab 03/30/18 1303 04/04/18 0554  AST 60* 33  ALT 36 20  ALKPHOS 115 79  BILITOT 0.2* 0.6  PROT 6.4* 5.4*  ALBUMIN 2.6* 2.1*   No results for input(s): LIPASE, AMYLASE in the last 168 hours. No results for input(s): AMMONIA in the last 168 hours. Coagulation profile Recent Labs  Lab 04/05/18 1159  INR 1.1    CBC: Recent Labs  Lab 03/30/18 1303 03/31/18 0519 04/01/18 0407 04/02/18 0421 04/03/18 0334 04/04/18 0554 04/05/18 0419  WBC 8.4 4.8 4.7 5.7  --   --   --   HGB 9.2* 9.7* 9.1* 9.7* 9.1* 9.3* 9.3*  HCT 29.9* 30.5* 28.5* 30.0* 28.0* 29.1* 29.1*  MCV 112.0* 107.4* 108.4* 105.6*  --   --   --   PLT 302 243 263 259  --   --   --    Cardiac Enzymes: Recent Labs  Lab 03/30/18 1531  TROPONINI <0.03   BNP (last 3 results) No results for input(s): PROBNP in the last 8760 hours. CBG: No results for input(s): GLUCAP in the last 168 hours. D-Dimer: No results for input(s): DDIMER in the last 72 hours. Hgb A1c: No results for input(s): HGBA1C in the last 72 hours. Lipid Profile: No results for input(s): CHOL, HDL, LDLCALC, TRIG, CHOLHDL, LDLDIRECT in the last 72 hours. Thyroid function studies: No results for input(s): TSH, T4TOTAL, T3FREE, THYROIDAB in the last 72 hours.  Invalid input(s): FREET3 Anemia work up: No results for input(s): VITAMINB12, FOLATE, FERRITIN, TIBC, IRON, RETICCTPCT in the last 72 hours. Sepsis Labs: Recent Labs  Lab 03/30/18 1303 03/31/18 0519 04/01/18 0407 04/02/18 0421  WBC 8.4 4.8 4.7 5.7   Microbiology Recent Results (from the past 240 hour(s))  Gram stain     Status: None   Collection Time: 03/31/18 12:26 PM  Result Value Ref Range Status   Specimen Description FLUID PLEURAL LEFT  Final   Special Requests NONE  Final   Gram Stain   Final    FEW WBC PRESENT, PREDOMINANTLY MONONUCLEAR NO  ORGANISMS SEEN Performed at [facility], [address] [place], [place] [address]    Report Status 03/31/2018 FINAL  Final  Culture, body fluid-bottle     Status: None   Collection Time: 03/31/18 12:26 PM  Result Value Ref Range Status   Specimen Description FLUID PLEURAL LEFT  Final   Special Requests NONE  Final   Culture   Final    NO GROWTH 5 DAYS Performed at [facility] [address], [place], [place] [address]  Report Status 04/05/2018 FINAL  Final     Medications:   . sodium chloride   Intravenous Once  . feeding supplement (ENSURE ENLIVE)  237 mL Oral BID BM  . nicotine  14 mg Transdermal Daily  . pantoprazole  40 mg Oral BID   Continuous Infusions:    LOS: 6 days   Charlynne Cousins  [facility]  04/06/2018, 8:13 AM

## 2018-04-07 ENCOUNTER — Inpatient Hospital Stay (HOSPITAL_COMMUNITY): Payer: Medicare Other | Admitting: Anesthesiology

## 2018-04-07 ENCOUNTER — Inpatient Hospital Stay (HOSPITAL_COMMUNITY): Payer: Medicare Other

## 2018-04-07 ENCOUNTER — Encounter (HOSPITAL_COMMUNITY)
Admission: EM | Disposition: A | Discharge status: Skilled Nursing Facility | Payer: Self-pay | Source: Home / Self Care | Attending: Internal Medicine

## 2018-04-07 DIAGNOSIS — J90 Pleural effusion, not elsewhere classified: Secondary | ICD-10-CM | POA: Diagnosis not present

## 2018-04-07 DIAGNOSIS — I119 Hypertensive heart disease without heart failure: Secondary | ICD-10-CM | POA: Diagnosis not present

## 2018-04-07 DIAGNOSIS — Z9689 Presence of other specified functional implants: Secondary | ICD-10-CM

## 2018-04-07 HISTORY — PX: CHEST TUBE INSERTION: SHX231

## 2018-04-07 LAB — CBC
HCT: 29.7 % — ABNORMAL LOW (ref 39.0–52.0)
Hemoglobin: 9.6 g/dL — ABNORMAL LOW (ref 13.0–17.0)
MCH: 33.7 pg (ref 26.0–34.0)
MCHC: 32.3 g/dL (ref 30.0–36.0)
MCV: 104.2 fL — ABNORMAL HIGH (ref 80.0–100.0)
Platelets: 353 10*3/uL (ref 150–400)
RBC: 2.85 MIL/uL — ABNORMAL LOW (ref 4.22–5.81)
RDW: 14 % (ref 11.5–15.5)
WBC: 5.4 10*3/uL (ref 4.0–10.5)
nRBC: 0 % (ref 0.0–0.2)

## 2018-04-07 LAB — COMPREHENSIVE METABOLIC PANEL
ALT: 17 U/L (ref 0–44)
AST: 26 U/L (ref 15–41)
Albumin: 2.2 g/dL — ABNORMAL LOW (ref 3.5–5.0)
Alkaline Phosphatase: 71 U/L (ref 38–126)
Anion gap: 8 (ref 5–15)
BUN: 9 mg/dL (ref 8–23)
CO2: 26 mmol/L (ref 22–32)
Calcium: 8 mg/dL — ABNORMAL LOW (ref 8.9–10.3)
Chloride: 103 mmol/L (ref 98–111)
Creatinine, Ser: 0.92 mg/dL (ref 0.61–1.24)
GFR calc Af Amer: >60 mL/min (ref >60)
GFR calc non Af Amer: >60 mL/min (ref >60)
Glucose, Bld: 92 mg/dL (ref 70–99)
Potassium: 3.5 mmol/L (ref 3.5–5.1)
Sodium: 137 mmol/L (ref 135–145)
Total Bilirubin: 0.7 mg/dL (ref 0.3–1.2)
Total Protein: 5.9 g/dL — ABNORMAL LOW (ref 6.5–8.1)

## 2018-04-07 LAB — SURGICAL PCR SCREEN
MRSA, PCR: NEGATIVE
Staphylococcus aureus: NEGATIVE

## 2018-04-07 LAB — PROTIME-INR
INR: 1.1 (ref 0.8–1.2)
Prothrombin Time: 13.7 seconds (ref 11.4–15.2)

## 2018-04-07 LAB — APTT: aPTT: 38 seconds — ABNORMAL HIGH (ref 24–36)

## 2018-04-07 SURGERY — INSERTION, PLEURAL DRAINAGE CATHETER
Anesthesia: Monitor Anesthesia Care | Laterality: Left

## 2018-04-07 MED ORDER — MIDAZOLAM HCL 2 MG/2ML IJ SOLN
INTRAMUSCULAR | Status: DC | PRN
  Administered 2018-04-07: 1 mg via INTRAVENOUS

## 2018-04-07 MED ORDER — ONDANSETRON HCL 4 MG/2ML IJ SOLN
INTRAMUSCULAR | Status: AC
  Filled 2018-04-07: qty 2

## 2018-04-07 MED ORDER — MIDAZOLAM HCL 2 MG/2ML IJ SOLN
INTRAMUSCULAR | Status: AC
  Filled 2018-04-07: qty 2

## 2018-04-07 MED ORDER — ACETAMINOPHEN 10 MG/ML IV SOLN: 1000.0000 mg | Freq: Once | INTRAVENOUS | Status: DC | PRN

## 2018-04-07 MED ORDER — ONDANSETRON HCL 4 MG/2ML IJ SOLN: 4.0000 mg | Freq: Once | INTRAMUSCULAR | Status: DC | PRN

## 2018-04-07 MED ORDER — ONDANSETRON HCL 4 MG/2ML IJ SOLN
INTRAMUSCULAR | Status: DC | PRN
  Administered 2018-04-07: 4 mg via INTRAVENOUS

## 2018-04-07 MED ORDER — PROPOFOL 1000 MG/100ML IV EMUL
INTRAVENOUS | Status: AC
  Filled 2018-04-07: qty 100

## 2018-04-07 MED ORDER — PROPOFOL 500 MG/50ML IV EMUL
INTRAVENOUS | Status: DC | PRN
  Administered 2018-04-07: 100 ug/kg/min via INTRAVENOUS

## 2018-04-07 MED ORDER — PHENYLEPHRINE 40 MCG/ML (10ML) SYRINGE FOR IV PUSH (FOR BLOOD PRESSURE SUPPORT)
PREFILLED_SYRINGE | INTRAVENOUS | Status: AC
  Filled 2018-04-07: qty 10

## 2018-04-07 MED ORDER — FENTANYL CITRATE (PF) 250 MCG/5ML IJ SOLN
INTRAMUSCULAR | Status: DC | PRN
  Administered 2018-04-07 (×2): 50 ug via INTRAVENOUS

## 2018-04-07 MED ORDER — CEFAZOLIN SODIUM-DEXTROSE 2-4 GM/100ML-% IV SOLN
2.0000 g | INTRAVENOUS | Status: AC
  Administered 2018-04-07: 2 g via INTRAVENOUS
  Filled 2018-04-07: qty 100

## 2018-04-07 MED ORDER — ENSURE ENLIVE PO LIQD
237.0000 mL | Freq: Three times a day (TID) | ORAL | Status: DC
  Administered 2018-04-07 – 2018-04-09 (×5): 237 mL via ORAL

## 2018-04-07 MED ORDER — PROPOFOL 10 MG/ML IV BOLUS
INTRAVENOUS | Status: AC
  Filled 2018-04-07: qty 20

## 2018-04-07 MED ORDER — KETOROLAC TROMETHAMINE 15 MG/ML IJ SOLN: 15.0000 mg | Freq: Once | INTRAMUSCULAR | Status: DC

## 2018-04-07 MED ORDER — LIDOCAINE HCL (PF) 1 % IJ SOLN
INTRAMUSCULAR | Status: DC | PRN
  Administered 2018-04-07: 15 mL

## 2018-04-07 MED ORDER — PHENYLEPHRINE 40 MCG/ML (10ML) SYRINGE FOR IV PUSH (FOR BLOOD PRESSURE SUPPORT)
PREFILLED_SYRINGE | INTRAVENOUS | Status: DC | PRN
  Administered 2018-04-07: 80 ug via INTRAVENOUS

## 2018-04-07 MED ORDER — LACTATED RINGERS IV SOLN
INTRAVENOUS | Status: DC | PRN
  Administered 2018-04-07: 07:00:00 via INTRAVENOUS

## 2018-04-07 MED ORDER — FENTANYL CITRATE (PF) 250 MCG/5ML IJ SOLN
INTRAMUSCULAR | Status: AC
  Filled 2018-04-07: qty 5

## 2018-04-07 MED ORDER — FENTANYL CITRATE (PF) 100 MCG/2ML IJ SOLN: 25.0000 ug | INTRAMUSCULAR | Status: DC | PRN

## 2018-04-07 MED ORDER — 0.9 % SODIUM CHLORIDE (POUR BTL) OPTIME
TOPICAL | Status: DC | PRN
  Administered 2018-04-07: 1000 mL

## 2018-04-07 SURGICAL SUPPLY — 27 items
BRUSH SCRUB EZ PLAIN DRY (MISCELLANEOUS) ×4 IMPLANT
CANISTER SUCT 3000ML PPV (MISCELLANEOUS) ×2 IMPLANT
COVER SURGICAL LIGHT HANDLE (MISCELLANEOUS) ×2 IMPLANT
COVER TRANSDUCER ULTRASND GEL (DRAPE) ×2 IMPLANT
COVER WAND RF STERILE (DRAPES) ×2 IMPLANT
DERMABOND ADVANCED (GAUZE/BANDAGES/DRESSINGS) ×1
DERMABOND ADVANCED .7 DNX12 (GAUZE/BANDAGES/DRESSINGS) ×1 IMPLANT
DRAPE C-ARM 42X72 X-RAY (DRAPES) ×2 IMPLANT
DRAPE LAPAROSCOPIC ABDOMINAL (DRAPES) ×2 IMPLANT
GLOVE BIO SURGEON STRL SZ 6.5 (GLOVE) ×4 IMPLANT
GOWN STRL REUS W/ TWL LRG LVL3 (GOWN DISPOSABLE) ×2 IMPLANT
GOWN STRL REUS W/TWL LRG LVL3 (GOWN DISPOSABLE) ×2
KIT BASIN OR (CUSTOM PROCEDURE TRAY) ×2 IMPLANT
KIT PLEURX DRAIN CATH 1000ML (MISCELLANEOUS) ×2 IMPLANT
KIT PLEURX DRAIN CATH 15.5FR (DRAIN) ×2 IMPLANT
KIT TURNOVER KIT B (KITS) ×2 IMPLANT
NS IRRIG 1000ML POUR BTL (IV SOLUTION) ×2 IMPLANT
PACK GENERAL/GYN (CUSTOM PROCEDURE TRAY) ×2 IMPLANT
PAD ARMBOARD 7.5X6 YLW CONV (MISCELLANEOUS) ×4 IMPLANT
SET DRAINAGE LINE (MISCELLANEOUS) IMPLANT
SUT ETHILON 3 0 FSL (SUTURE) ×2 IMPLANT
SUT VIC AB 3-0 X1 27 (SUTURE) ×2 IMPLANT
SYR CONTROL 10ML LL (SYRINGE) ×2 IMPLANT
TOWEL GREEN STERILE (TOWEL DISPOSABLE) ×2 IMPLANT
TOWEL GREEN STERILE FF (TOWEL DISPOSABLE) ×2 IMPLANT
VALVE REPLACEMENT CAP (MISCELLANEOUS) IMPLANT
WATER STERILE IRR 1000ML POUR (IV SOLUTION) ×2 IMPLANT

## 2018-04-07 NOTE — Progress Notes (Signed)
Nutrition Follow-up  DOCUMENTATION CODES:   Non-severe (moderate) malnutrition in context of chronic illness  INTERVENTION:   Increase Ensure Enlive to TID, each supplement provides 350 kcal and 20 grams of protein  Encourage menu alternates, reviewed with patient importance of nutrition after d/c to SNF   NUTRITION DIAGNOSIS:   Moderate Malnutrition related to chronic illness(Lung Cancer, possible Metastatic) as evidenced by percent weight loss, mild muscle depletion. Ongoing.   GOAL:   Patient will meet greater than or equal to 90% of their needs Progressing.  MONITOR:   PO intake, Supplement acceptance, Weight trends, Labs, Diet advancement  REASON FOR ASSESSMENT:   Malnutrition Screening Tool(MST 2)    ASSESSMENT:   Pt is a 55y M with PMH of lung cancer, AAA (2018 Stent), HTN, anxiety, 6y ago- colonoscopy and endoscopy. Presented to ED with fatigue, weakness, dizziness, and dark stools for 1+months, worsening over the last week. Admitted now for Acute GI bleed. Symptomatic GI Bleed with Anemia , Chest x-ray showing metastatic foci in L Lung, L upper lobe with nodular lesion suspicious for metastasis.    Spoke with pt who reports that after surgery today he is sore. He reports that he does not like the food here. Wife brought him a [employer] sandwich today. He does like the ensure and has been drinking at least 2 per day. We discussed the importance of nutrition after discharge and encouraged him to continue to drink ensure at SNF.   2/21 EGD: 1 non-bleeding duodenal ulcer. Erythematous mucosa in the antrum, checking for H. Pylori. L Thoracentesis yielding 1.3L of amber fluid. 2/25 L thoracentesis 800 ml  2/28 Pleurx catheter inserted  Weight has trended down, I&O positive 4.7 L  Diet Order:   Diet Order            DIET SOFT Room service appropriate? Yes; Fluid consistency: Thin  Diet effective now              EDUCATION NEEDS:   Education needs have been  addressed  Skin:  Skin Assessment: Skin Integrity Issues: Skin Integrity Issues:: Stage I Stage I: Stage I on Sacrum: Intact skin with non-blanchable redness of a localized area usually over a bony prominence.   Last BM:  2/27  Height:   Ht Readings from Last 1 Encounters:  03/31/18 5\' 10"  (1.778 m)    Weight:   Wt Readings from Last 1 Encounters:  04/06/18 75 kg  Admission weight: 171 lg (77.8 kg)  Ideal Body Weight:  75.5 kg  BMI:  Body mass index is 23.72 kg/m.  Estimated Nutritional Needs:   Kcal:  2000-2300  Protein:  100-115 grams  Fluid:  >2L  Maylon Peppers RD, LDN, CNSC [phone] Pager [phone] After Hours Pager

## 2018-04-07 NOTE — Transfer of Care (Signed)
Immediate Anesthesia Transfer of Care Note  Patient: Tyler Mcmillan  Procedure(s) Performed: INSERTION PLEURAL DRAINAGE CATHETER USING ULTRASOUND AND FLURO GUIDANCE (Left )  Patient Location: PACU  Anesthesia Type:MAC  Level of Consciousness: awake, alert  and oriented  Airway & Oxygen Therapy: Patient Spontanous Breathing and Patient connected to nasal cannula oxygen  Post-op Assessment: Report given to RN and Post -op Vital signs reviewed and stable  Post vital signs: Reviewed and stable  Last Vitals:  Vitals Value Taken Time  BP    Temp    Pulse    Resp    SpO2      Last Pain:  Vitals:   04/07/18 0954  TempSrc:   PainSc: 8       Patients Stated Pain Goal: 2 (73/57/89 7847)  Complications: No apparent anesthesia complications

## 2018-04-07 NOTE — Progress Notes (Signed)
      [address]       [place] [address]             [phone]     Pre Procedure note for inpatients:   Tyler Mcmillan has been scheduled for Procedure(s): INSERTION PLEURAL DRAINAGE CATHETER (Left) today. The various methods of treatment have been discussed with the patient. After consideration of the risks, benefits and treatment options the patient has consented to the planned procedure.   The patient has been seen and labs reviewed. There are no changes in the patient's condition to prevent proceeding with the planned procedure today. Left chest marked  Recent labs:  Lab Results  Component Value Date   WBC 5.4 04/07/2018   HGB 9.6 (L) 04/07/2018   HCT 29.7 (L) 04/07/2018   PLT 353 04/07/2018   GLUCOSE 92 04/07/2018   ALT 17 04/07/2018   AST 26 04/07/2018   NA 137 04/07/2018   K 3.5 04/07/2018   CL 103 04/07/2018   CREATININE 0.92 04/07/2018   BUN 9 04/07/2018   CO2 26 04/07/2018   INR 1.1 04/07/2018    Grace Isaac, MD 04/07/2018 7:00 AM

## 2018-04-07 NOTE — Progress Notes (Signed)
OT Cancellation Note  Patient Details Name: Tyler Mcmillan MRN: [id number] DOB: March 14, 1945   Cancelled Treatment:    Reason Eval/Treat Not Completed: Patient declined, no reason specified. Pt adamantly refused to participate I any OOB activity or to sit up in the recliner today. Pt's RN notified  Britt Bottom 04/07/2018, 10:29 AM

## 2018-04-07 NOTE — Brief Op Note (Signed)
      [address]       [place] [address]             [phone]     04/07/2018  8:01 AM  PATIENT:  Tyler Mcmillan  73 y.o. male  PRE-OPERATIVE DIAGNOSIS:  RECURRENT LEFT PLEURAL EFFUSION  POST-OPERATIVE DIAGNOSIS:  RECURRENT LEFT PLEURAL EFFUSION  PROCEDURE:  Procedure(s): INSERTION PLEURAL DRAINAGE CATHETER USING ULTRASOUND AND FLURO GUIDANCE (Left)  SURGEON:  Surgeon(s) and Role:    * Grace Isaac, MD - Primary   ANESTHESIA:   MAC  EBL:  5 mL   BLOOD ADMINISTERED:none  DRAINS: left plurex cath   LOCAL MEDICATIONS USED:  LIDOCAINE  and Amount: 8 ml  SPECIMEN:  Source of Specimen:  left pleural fluid  DISPOSITION OF SPECIMEN:  PATHOLOGY  COUNTS:  YES   DICTATION: .Dragon Dictation  PLAN OF CARE: patient is in pateint  PATIENT DISPOSITION:  PACU - hemodynamically stable.   Delay start of Pharmacological VTE agent (>24hrs) due to surgical blood loss or risk of bleeding: yes

## 2018-04-07 NOTE — Progress Notes (Addendum)
[facility] PROGRESS NOTE    Progress Note  Jayveon Convey  YTK:[id number] DOB: 1945-03-13 DOA: 03/30/2018 PCP: [name], MD     Brief Narrative:   Leovanni Bjorkman is an 73 y.o. male past medical history of lung cancer, AAA despite stent placement back in 2019, anxiety who presents to the emergency room with complains of fatigue weakness dizziness dark stools for several months which has worsened over the last week.  He is not on anticoagulants, but he does endorse using Advil for pain.  Came into the hospital for frequent falls and unable to move.  Assessment/Plan:   Antibiotic anemia due to Acute GI bleeding/ Acute post-hemorrhagic anemia Empirically on IV Protonix, status post EGD 03/31/2018 that showed erythematous mucosa and a nonbleeding duodenal ulcer with no stigmata of bleeding. Continue Protonix twice daily.  Tolerating his diet well. Awaiting physical therapy evaluation.  Falls: Likely secondary to anemia versus cancer. Physical therapy evaluated the patient the recommended skilled nursing facility.  Chronic pain: No changes made to his medication.  Anxiety: Continue low-dose Ativan.  History of lung cancer with probable malignant left effusion: CTA showed a large pleural effusion with a low-density lesion in the left upper lobe at the side of the cavitary mass. CT surgery was consulted and recommended a Pleurx catheter placed on 04/07/2018  Acute respiratory failure with hypoxia due to left pleural effusion Improved not hypoxia. Multifactorial in the setting of anemia and pleural effusion.  History of AAA: CTA of the abdomen and pelvis was done, Dr. Truman Hayward discussed the case with the patient.  Tobacco abuse: Has no intention of quitting.  Malnutrition of moderate degree Ensure TID  Pressure injury of skin  RN Pressure Injury Documentation: Pressure Injury 03/30/18 Stage I -  Intact skin with non-blanchable redness of a localized area usually over a  bony prominence. (Active)  03/30/18 2000  Location: Sacrum  Location Orientation:   Staging: Stage I -  Intact skin with non-blanchable redness of a localized area usually over a bony prominence.  Wound Description (Comments):   Present on Admission: Yes    Estimated body mass index is 23.72 kg/m as calculated from the following:   Height as of this encounter: 5\' 10"  (1.778 m).   Weight as of this encounter: 75 kg. Malnutrition Type:  Nutrition Problem: Moderate Malnutrition Etiology: chronic illness(Lung Cancer, possible Metastatic)   Malnutrition Characteristics:  Signs/Symptoms: percent weight loss, mild muscle depletion Percent weight loss: 10.5 %   Nutrition Interventions:  Interventions: Ensure Enlive (each supplement provides 350kcal and 20 grams of protein)  DVT prophylaxis: lovenox Family Communication:none Disposition Plan/Barrier to D/C: SNF in am Code Status:     Code Status Orders  (From admission, onward)         Start     Ordered   04/03/18 0855  Limited resuscitation (code)  Continuous    Question Answer Comment  In the event of cardiac or respiratory ARREST: Initiate Code Blue, Call Rapid Response Yes   In the event of cardiac or respiratory ARREST: Perform CPR Yes   In the event of cardiac or respiratory ARREST: Perform Intubation/Mechanical Ventilation No   In the event of cardiac or respiratory ARREST: Use NIPPV/BiPAp only if indicated Yes   In the event of cardiac or respiratory ARREST: Administer ACLS medications if indicated Yes   In the event of cardiac or respiratory ARREST: Perform Defibrillation or Cardioversion if indicated Yes      04/03/18 0854  Code Status History    Date Active Date Inactive Code Status Order ID Comments User Context   03/30/2018 1955 04/03/2018 0854 Full Code [id number]  Cristal Ford, DO Inpatient   09/08/2016 1602 09/09/2016 1520 Full Code [id number]  Orbie Hurst Inpatient   07/02/2016 1456  07/03/2016 1654 Full Code [id number]  Elwin Mocha, MD ED    Advance Directive Documentation     Most Recent Value  Type of Advance Directive  Living will, Healthcare Power of Attorney  Pre-existing out of facility DNR order (yellow form or pink MOST form)  -  "MOST" Form in Place?  -        IV Access:    Peripheral IV   Procedures and diagnostic studies:   Dg Chest 2 View  Result Date: 04/06/2018 CLINICAL DATA:  Left chest pain in a cigarette smoker. History of pleural effusion. Status post thoracentesis 04/04/2018. EXAM: CHEST - 2 VIEW COMPARISON:  Single-view of the chest 04/04/2018, 03/31/2018 and 04/03/2018. CT chest, abdomen and pelvis 03/30/2018. FINDINGS: Moderate to moderately large left pleural effusion has increased in size since the most recent examination. There is associated left basilar atelectasis. 3.1 cm rounded opacity in the left upper lobe is unchanged. The right lung is clear. The lungs are emphysematous. No pneumothorax. Cardiac silhouette is obscured. IMPRESSION: Moderate to moderately large left pleural effusion has increased in size since the most recent examination. No change in a 3.1 cm left upper lobe lesion. Emphysema. Electronically Signed   By: Inge Rise M.D.   On: 04/06/2018 10:33   Dg C-arm 1-60 Min-no Report  Result Date: 04/07/2018 Fluoroscopy was utilized by the requesting physician.  No radiographic interpretation.     Medical Consultants:    None.  Anti-Infectives:   NOne  Subjective:    Brenner Visconti rates his pain is controlled shortness of breath is also controlled.  Objective:    Vitals:   04/07/18 0816 04/07/18 0820 04/07/18 0830 04/07/18 0939  BP:  113/66  127/64  Pulse: 89 88  94  Resp: (!) 23 16  18   Temp:   (!) 97.4 F (36.3 C) 98.3 F (36.8 C)  TempSrc:    Oral  SpO2: 98% 98%  93%  Weight:      Height:        Intake/Output Summary (Last 24 hours) at 04/07/2018 1026 Last data filed at 04/07/2018  0830 Gross per 24 hour  Intake 675 ml  Output 5 ml  Net 670 ml   Filed Weights   04/01/18 2135 04/02/18 2113 04/06/18 2106  Weight: 78.3 kg 78.2 kg 75 kg    Exam: General exam: In no acute distress. Respiratory system: Good air movement and clear to auscultation. Cardiovascular system: S1 & S2 heard, RRR. Gastrointestinal system: Abdomen is nondistended, soft and nontender.  Central nervous system: Alert and oriented. No focal neurological deficits. Extremities: No pedal edema. Skin: No rashes, lesions or ulcers Psychiatry: Judgement and insight appear normal. Mood & affect appropriate.    Data Reviewed:    Labs: Basic Metabolic Panel: Recent Labs  Lab 04/03/18 0334 04/04/18 0554 04/05/18 0419 04/07/18 0339  NA 135 135 135 137  K 3.7 3.6 3.4* 3.5  CL 102 101 101 103  CO2 27 26 26 26   GLUCOSE 94 90 93 92  BUN 5* 7* 8 9  CREATININE 0.80 0.93 0.86 0.92  CALCIUM 7.7* 8.0* 8.1* 8.0*   GFR Estimated Creatinine Clearance: 74.9 mL/min (by C-G formula based on  SCr of 0.92 mg/dL). Liver Function Tests: Recent Labs  Lab 04/04/18 0554 04/07/18 0339  AST 33 26  ALT 20 17  ALKPHOS 79 71  BILITOT 0.6 0.7  PROT 5.4* 5.9*  ALBUMIN 2.1* 2.2*   No results for input(s): LIPASE, AMYLASE in the last 168 hours. No results for input(s): AMMONIA in the last 168 hours. Coagulation profile Recent Labs  Lab 04/05/18 1159 04/07/18 0339  INR 1.1 1.1    CBC: Recent Labs  Lab 04/01/18 0407 04/02/18 0421 04/03/18 0334 04/04/18 0554 04/05/18 0419 04/07/18 0339  WBC 4.7 5.7  --   --   --  5.4  HGB 9.1* 9.7* 9.1* 9.3* 9.3* 9.6*  HCT 28.5* 30.0* 28.0* 29.1* 29.1* 29.7*  MCV 108.4* 105.6*  --   --   --  104.2*  PLT 263 259  --   --   --  353   Cardiac Enzymes: No results for input(s): CKTOTAL, CKMB, CKMBINDEX, TROPONINI in the last 168 hours. BNP (last 3 results) No results for input(s): PROBNP in the last 8760 hours. CBG: No results for input(s): GLUCAP in the last  168 hours. D-Dimer: No results for input(s): DDIMER in the last 72 hours. Hgb A1c: No results for input(s): HGBA1C in the last 72 hours. Lipid Profile: No results for input(s): CHOL, HDL, LDLCALC, TRIG, CHOLHDL, LDLDIRECT in the last 72 hours. Thyroid function studies: No results for input(s): TSH, T4TOTAL, T3FREE, THYROIDAB in the last 72 hours.  Invalid input(s): FREET3 Anemia work up: No results for input(s): VITAMINB12, FOLATE, FERRITIN, TIBC, IRON, RETICCTPCT in the last 72 hours. Sepsis Labs: Recent Labs  Lab 04/01/18 0407 04/02/18 0421 04/07/18 0339  WBC 4.7 5.7 5.4   Microbiology Recent Results (from the past 240 hour(s))  Gram stain     Status: None   Collection Time: 03/31/18 12:26 PM  Result Value Ref Range Status   Specimen Description FLUID PLEURAL LEFT  Final   Special Requests NONE  Final   Gram Stain   Final    FEW WBC PRESENT, PREDOMINANTLY MONONUCLEAR NO ORGANISMS SEEN Performed at [facility], [address] [place], [place] [address]    Report Status 03/31/2018 FINAL  Final  Culture, body fluid-bottle     Status: None   Collection Time: 03/31/18 12:26 PM  Result Value Ref Range Status   Specimen Description FLUID PLEURAL LEFT  Final   Special Requests NONE  Final   Culture   Final    NO GROWTH 5 DAYS Performed at [facility] [address], [place], [place] [address]    Report Status 04/05/2018 FINAL  Final  Surgical pcr screen     Status: None   Collection Time: 04/07/18  4:24 AM  Result Value Ref Range Status   MRSA, PCR NEGATIVE NEGATIVE Final   Staphylococcus aureus NEGATIVE NEGATIVE Final    Comment: (NOTE) The Xpert SA Assay (FDA approved for NASAL specimens in patients 43 years of age and older), is one component of a comprehensive surveillance program. It is not intended to diagnose infection nor to guide or monitor treatment. Performed at [facility], [place] [address], [place], [place] [address]       Medications:   . feeding supplement (ENSURE ENLIVE)  237 mL Oral BID BM  . nicotine  14 mg Transdermal Daily  . pantoprazole  40 mg Oral BID   Continuous Infusions:    LOS: 7 days   Charlynne Cousins  [facility]  04/07/2018, 10:26 AM

## 2018-04-07 NOTE — Progress Notes (Signed)
PT Cancellation Note  Patient Details Name: Tyler Mcmillan MRN: [id number] DOB: February 03, 1946   Cancelled Treatment:    Reason Eval/Treat Not Completed: Patient at procedure or test/unavailable. Pt currently off unit for procedure. Will continue to follow.    [name] 04/07/2018, 7:10 AM   [name], PT, DPT Acute Rehabilitation Services Pager: [phone] Office: [phone]

## 2018-04-07 NOTE — Clinical Social Work Note (Signed)
Met with patient today and requested facility decision. [facility] chosen and per telephone conversation with Gayla Medicus, admissions liaison, they can accept patient. Insurance authorization initiated by CSW with [employer]. CSW will continue to follow and facilitate discharge to [facility] once authorization received.  Armida Vickroy Givens, MSW, LCSW Licensed Clinical Social Worker [place] [phone]

## 2018-04-08 ENCOUNTER — Inpatient Hospital Stay (HOSPITAL_COMMUNITY): Payer: Medicare Other

## 2018-04-08 MED ORDER — CLONAZEPAM 0.5 MG PO TABS: 0.5000 mg | ORAL_TABLET | Freq: Two times a day (BID) | ORAL | 0 refills | Status: DC

## 2018-04-08 MED ORDER — PANTOPRAZOLE SODIUM 40 MG PO TBEC: 40.0000 mg | DELAYED_RELEASE_TABLET | Freq: Two times a day (BID) | ORAL | 3 refills | Status: DC

## 2018-04-08 MED ORDER — OXYCODONE HCL 5 MG PO TABS: 5.0000 mg | ORAL_TABLET | ORAL | 0 refills | Status: DC | PRN

## 2018-04-08 NOTE — Discharge Summary (Signed)
Physician Discharge Summary  Tyler Mcmillan XBM:[id number] DOB: 09/16/1945 DOA: 03/30/2018  PCP: [name], MD  Admit date: 03/30/2018 Discharge date: 04/08/2018  Admitted From: Home Disposition:  SNF  Recommendations for Outpatient Follow-up:  1. Follow up with PCP in 1-2 weeks 2. We will follow-up with CT surgery as an outpatient. 3. Follow-up with oncology as an outpatient to continue radiation and possibly chemotherapy as an outpatient.  Home Health:No Equipment/Devices:None  Discharge Condition:Stable CODE STATUS:Full Diet recommendation: Heart Healthy   Brief/Interim Summary: 73 y.o. male past medical history of lung cancer, AAA despite stent placement back in 2019, anxiety who presents to the emergency room with complains of fatigue weakness dizziness dark stools for several months which has worsened over the last week.  He is not on anticoagulants, but he does endorse using Advil for pain.  Came into the hospital for frequent falls and unable to move.  Discharge Diagnoses:  Active Problems:   Acute GI bleeding   Acute post-hemorrhagic anemia   Malignant neoplasm metastatic to left lung [facility])   Peripheral edema   Pleural effusion on left   Pleural effusion, left   Fall at home, initial encounter   Generalized weakness   GI bleed   Malnutrition of moderate degree   Pressure injury of skin   Acute on chronic respiratory failure with hypoxia ([name])  Acute upper GI bleed due to duodenal ulcer/acute posthemorrhagic anemia: His hemoglobin on admission was 9.2 he was transfused 2 unit of packed red blood cells. The patient endorses Advil use. GI was consulted who recommended IV Protonix and an EGD that was performed on 03/31/2018 that showed erythematous mucosa with a nonbleeding duodenal ulcer with no signs of bleeding. Cont. Protonix twice daily as an outpatient.  Mechanical fall: Likely secondary to anemia versus cancer. Physical therapy evaluated the patient the  recommended skilled nursing facility placement.  Chronic pain: No change made to his medication.  Anxiety: Continue low-dose Ativan.  History of lung cancer with pleural effusion questionable malignant: A CTA was done that showed a a cavitary mass large pleural effusion which work centesis was performed 3 times. CT surgery was consulted and Pleurx catheter was put in place on 04/07/2018 he will continue to follow-up with CT surgery as an outpatient.  Acute respiratory failure with hypoxia due to large left pleural effusion and possibly acute blood loss anemia: Now resolved, with treatment of condition as above.  History of AAA: We will follow-up with CT surgery as an outpatient.  Tobacco abuse: He has no intention of quitting.  Moderate caloric malnutrition: Continue Ensure 3 times daily.  Discharge Instructions  Discharge Instructions    Diet - low sodium heart healthy   Complete by:  As directed    Increase activity slowly   Complete by:  As directed      Allergies as of 04/08/2018   No Known Allergies     Medication List    STOP taking these medications   ibuprofen 200 MG tablet Commonly known as:  ADVIL,MOTRIN     TAKE these medications   clonazePAM 0.5 MG tablet Commonly known as:  KLONOPIN Take 1 tablet (0.5 mg total) by mouth 2 (two) times daily.   oxyCODONE 5 MG immediate release tablet Commonly known as:  Oxy IR/ROXICODONE Take 1 tablet (5 mg total) by mouth every 4 (four) hours as needed for moderate pain or severe pain.   pantoprazole 40 MG tablet Commonly known as:  PROTONIX Take 1 tablet (40 mg total) by  mouth 2 (two) times daily.       No Known Allergies  Consultations:  GI  CT surgery   Procedures/Studies: Dg Chest 1 View  Result Date: 04/04/2018 CLINICAL DATA:  Pleural effusion.  Follow-up thoracentesis. EXAM: CHEST  1 VIEW COMPARISON:  04/03/2018 FINDINGS: Right chest remains clear. Slightly smaller pleural effusion on the left.  Persistent atelectasis in the left lung. No sign of pneumothorax. IMPRESSION: Slightly less pleural fluid on the left following thoracentesis. No pneumothorax. Persistent atelectasis. Electronically Signed   By: Nelson Chimes M.D.   On: 04/04/2018 13:18   Dg Chest 1 View  Result Date: 04/03/2018 CLINICAL DATA:  Status post left thoracentesis EXAM: CHEST  1 VIEW COMPARISON:  04/03/2018 FINDINGS: There has been interval mild decrease in pleural effusion following thoracentesis. Persistent left upper lobe mass lesion is seen. No definitive pneumothorax is seen. The right lung remains clear. Aortic calcifications are again noted. IMPRESSION: Slight decrease in left-sided pleural effusion. No evidence of pneumothorax. Electronically Signed   By: Inez Catalina M.D.   On: 04/03/2018 09:48   Dg Chest 1 View  Result Date: 03/31/2018 CLINICAL DATA:  Left thoracentesis. EXAM: CHEST  1 VIEW COMPARISON:  CT chest and chest x-ray from yesterday. FINDINGS: The left heart border remains obscured. Slight interval decrease in size of the moderate left pleural effusion. Relatively unchanged atelectasis in the left lower greater than upper lobes. Unchanged 3.4 cm mass in the left upper lobe. The right lung remains clear. No pneumothorax. No acute osseous abnormality. IMPRESSION: 1. Slight interval decrease in size of moderate left pleural effusion status post thoracentesis. No pneumothorax. 2. Relatively unchanged left lung atelectasis. 3. Unchanged 3.4 cm left upper lobe mass. Electronically Signed   By: Tyler Dubin M.D.   On: 03/31/2018 12:42   Dg Chest 2 View  Result Date: 04/06/2018 CLINICAL DATA:  Left chest pain in a cigarette smoker. History of pleural effusion. Status post thoracentesis 04/04/2018. EXAM: CHEST - 2 VIEW COMPARISON:  Single-view of the chest 04/04/2018, 03/31/2018 and 04/03/2018. CT chest, abdomen and pelvis 03/30/2018. FINDINGS: Moderate to moderately large left pleural effusion has increased in  size since the most recent examination. There is associated left basilar atelectasis. 3.1 cm rounded opacity in the left upper lobe is unchanged. The right lung is clear. The lungs are emphysematous. No pneumothorax. Cardiac silhouette is obscured. IMPRESSION: Moderate to moderately large left pleural effusion has increased in size since the most recent examination. No change in a 3.1 cm left upper lobe lesion. Emphysema. Electronically Signed   By: Inge Rise M.D.   On: 04/06/2018 10:33   Dg Chest 2 View  Result Date: 03/30/2018 CLINICAL DATA:  Fatigue and weakness. Reported history of lung carcinoma EXAM: CHEST - 2 VIEW COMPARISON:  May 16, 2017 FINDINGS: There is a sizable left pleural effusion. There is a nodular opacity in the anterior segment of the left upper lobe measuring 3.2 x 3.1 x 3.4 cm. There are suspected nodular lesions partially obscured by the effusion in the left perihilar region. These areas are concerning for metastases. Right lung is clear. The heart size and pulmonary vascularity are normal. No adenopathy is appreciable by radiography. There is aortic atherosclerosis. There are foci of carotid artery calcification. There are wedge fractures in the midthoracic region. There are no blastic or lytic bone lesions. IMPRESSION: Suspect metastatic foci in left lung. There is a well-defined nodular lesion in the anterior segment left upper lobe which is certainly suspicious for metastasis.  There is a large left pleural effusion. Right lung is clear. Cardiac silhouette is within normal limits. Aortic Atherosclerosis (ICD10-I70.0). There are foci of carotid artery calcification. Given concern for metastatic disease, chest CT, ideally with intravenous contrast, may be advisable. Electronically Signed   By: Lowella Grip III M.D.   On: 03/30/2018 16:16   Dg Chest Port 1 View  Result Date: 04/08/2018 CLINICAL DATA:  Lung carcinoma EXAM: PORTABLE CHEST 1 VIEW COMPARISON:  04/06/2018  FINDINGS: Normal heart size. New left chest tube is in place. Left pleural effusion has improved. Small less than 5% left apical pneumothorax. Heterogeneous opacities throughout the left lung are unchanged. Right lung is clear. IMPRESSION: New left chest tube with improved left pleural effusion. Small left apical pneumothorax. Electronically Signed   By: Marybelle Killings M.D.   On: 04/08/2018 09:17   Dg Chest Port 1 View  Result Date: 04/03/2018 CLINICAL DATA:  Status post left thoracentesis. EXAM: PORTABLE CHEST 1 VIEW COMPARISON:  Radiograph of March 31, 2018. FINDINGS: Stable cardiomediastinal silhouette. Atherosclerosis of thoracic aorta is noted. Moderate left pleural effusion is noted with underlying atelectasis or infiltrate. No pneumothorax is noted. Stable left upper lobe mass is noted. Right lung is clear. Bony thorax is unremarkable. IMPRESSION: Stable moderate left pleural effusion is noted with underlying atelectasis or infiltrate. Stable left upper lobe mass is noted. Electronically Signed   By: Marijo Conception, M.D.   On: 04/03/2018 08:28   Dg C-arm 1-60 Min-no Report  Result Date: 04/07/2018 Fluoroscopy was utilized by the requesting physician.  No radiographic interpretation.   Ct Angio Chest Aorta W/cm &/or Wo/cm  Result Date: 03/30/2018 CLINICAL DATA:  Abnormal chest x-ray history of aortic aneurysm repair EXAM: CT ANGIOGRAPHY CHEST, ABDOMEN AND PELVIS TECHNIQUE: Multidetector CT imaging through the chest, abdomen and pelvis was performed using the standard protocol during bolus administration of intravenous contrast. Multiplanar reconstructed images and MIPs were obtained and reviewed to evaluate the vascular anatomy. CONTRAST:  127mL ISOVUE-370 IOPAMIDOL (ISOVUE-370) INJECTION 76% COMPARISON:  Radiograph 03/30/2018, 05/16/2017, CT chest 04/21/2017, 12/08/2016, CT angiogram 10/19/2016 FINDINGS: CTA CHEST FINDINGS Cardiovascular: Non contrasted images of the chest demonstrate moderate  aortic atherosclerosis. No aneurysmal dilatation. Coronary vascular calcification. Normal heart size. Trace pericardial effusion. Following contrast, no dissection is seen. Central pulmonary arteries are patent. Mediastinum/Nodes: Midline trachea. No thyroid mass. No significant adenopathy. Esophagus within normal limits. Lungs/Pleura: Large left-sided pleural effusion. Moderate emphysema. Right lung is clear. Partial atelectasis in the left lower lobe. Cystic density, corresponding to radiographic abnormality in the left upper lobe, this measures 2.9 x 2.9 cm, previously 3.1 x 3.4 cm and is in the region of the previously demonstrated cavitary lesion. Scarring in the left upper lobe. No other suspicious pulmonary nodules. Musculoskeletal: No acute acute or suspicious abnormality. Review of the MIP images confirms the above findings. CTA ABDOMEN AND PELVIS FINDINGS VASCULAR Aorta: Moderate aortic atherosclerosis. Status post endovascular repair of infrarenal abdominal aortic aneurysm just below the renal arteries to the bilateral common iliac arteries. There is graft patency. Residual aneurysm sac measures 5.8 x 6 cm as compared with 5.7 x 6 cm previously. Small enhancing vessels are again noted within the aneurysm sac. Celiac: Calcification at the origin without significant stenosis. SMA: Mild stenosis at the origin. Renals: Single right renal artery and 2 left renal arteries including a small accessory artery to the upper pole. Moderate focal stenosis at the origin of the dominant left renal artery as before. IMA: Not well opacified at  the origin. Inflow: Both internal iliac arteries are patent. Atherosclerotic disease of the external iliac arteries. Veins: No obvious venous abnormality within the limitations of this arterial phase study. Review of the MIP images confirms the above findings. NON-VASCULAR Hepatobiliary: Slightly nodular liver contour, possible cirrhosis. Calcified stone at the gallbladder neck. No  biliary dilatation Pancreas: Unremarkable. No pancreatic ductal dilatation or surrounding inflammatory changes. Spleen: Within normal limits. Adrenals/Urinary Tract: Adrenal glands are unremarkable. Kidneys are normal, without renal calculi, focal lesion, or hydronephrosis. Bladder is unremarkable. Stomach/Bowel: The stomach is within normal limits. No dilated small bowel. No colon wall thickening. Lymphatic: No significantly enlarged lymph nodes. Reproductive: Slightly heterogeneous enhancement of the prostate with calcifications. Other: No free air. Small free fluid in the pelvis. Fat containing right greater than left inguinal hernia with possible small fluid in the right inguinal region. 2.4 cm subcutaneous cyst in the right gluteal region. Musculoskeletal: Acute to subacute fracture involving the L1 vertebral body with close to 50% loss of height anteriorly. No significant retropulsion. Trace anterolisthesis L4 on L5. Review of the MIP images confirms the above findings. IMPRESSION: 1. Status post endovascular repair of distal infrarenal abdominal aortic aneurysm. The excluded aneurysm sac is unchanged in size as compared with 10/19/2016 and measures 5.8 x 6 cm. Arterial phase enhancement is again noted within the excluded sac, consistent with type 2 endoleak. 2. Large left-sided pleural effusion. Low-density lesion in the left upper lobe at the site of prior cavitary mass has further decreased in size and is felt to correspond to the radiographic nodule. No other suspicious lung nodules are seen. 3. Slightly nodular liver contour, possible cirrhosis 4. Gallstone 5. Heterogeneous enhancement of the prostate gland with calcification. 6. Small free fluid in the pelvis 7. Acute to subacute fracture involving L1 vertebral body with close to 50% loss of height anteriorly 8. No definite CT evidence for metastatic disease to the abdomen or pelvis Electronically Signed   By: Donavan Foil M.D.   On: 03/30/2018 23:06    Ct Angio Abd/pel W/ And/or W/o  Result Date: 03/30/2018 CLINICAL DATA:  Abnormal chest x-ray history of aortic aneurysm repair EXAM: CT ANGIOGRAPHY CHEST, ABDOMEN AND PELVIS TECHNIQUE: Multidetector CT imaging through the chest, abdomen and pelvis was performed using the standard protocol during bolus administration of intravenous contrast. Multiplanar reconstructed images and MIPs were obtained and reviewed to evaluate the vascular anatomy. CONTRAST:  170mL ISOVUE-370 IOPAMIDOL (ISOVUE-370) INJECTION 76% COMPARISON:  Radiograph 03/30/2018, 05/16/2017, CT chest 04/21/2017, 12/08/2016, CT angiogram 10/19/2016 FINDINGS: CTA CHEST FINDINGS Cardiovascular: Non contrasted images of the chest demonstrate moderate aortic atherosclerosis. No aneurysmal dilatation. Coronary vascular calcification. Normal heart size. Trace pericardial effusion. Following contrast, no dissection is seen. Central pulmonary arteries are patent. Mediastinum/Nodes: Midline trachea. No thyroid mass. No significant adenopathy. Esophagus within normal limits. Lungs/Pleura: Large left-sided pleural effusion. Moderate emphysema. Right lung is clear. Partial atelectasis in the left lower lobe. Cystic density, corresponding to radiographic abnormality in the left upper lobe, this measures 2.9 x 2.9 cm, previously 3.1 x 3.4 cm and is in the region of the previously demonstrated cavitary lesion. Scarring in the left upper lobe. No other suspicious pulmonary nodules. Musculoskeletal: No acute acute or suspicious abnormality. Review of the MIP images confirms the above findings. CTA ABDOMEN AND PELVIS FINDINGS VASCULAR Aorta: Moderate aortic atherosclerosis. Status post endovascular repair of infrarenal abdominal aortic aneurysm just below the renal arteries to the bilateral common iliac arteries. There is graft patency. Residual aneurysm sac measures 5.8 x  6 cm as compared with 5.7 x 6 cm previously. Small enhancing vessels are again noted within the  aneurysm sac. Celiac: Calcification at the origin without significant stenosis. SMA: Mild stenosis at the origin. Renals: Single right renal artery and 2 left renal arteries including a small accessory artery to the upper pole. Moderate focal stenosis at the origin of the dominant left renal artery as before. IMA: Not well opacified at the origin. Inflow: Both internal iliac arteries are patent. Atherosclerotic disease of the external iliac arteries. Veins: No obvious venous abnormality within the limitations of this arterial phase study. Review of the MIP images confirms the above findings. NON-VASCULAR Hepatobiliary: Slightly nodular liver contour, possible cirrhosis. Calcified stone at the gallbladder neck. No biliary dilatation Pancreas: Unremarkable. No pancreatic ductal dilatation or surrounding inflammatory changes. Spleen: Within normal limits. Adrenals/Urinary Tract: Adrenal glands are unremarkable. Kidneys are normal, without renal calculi, focal lesion, or hydronephrosis. Bladder is unremarkable. Stomach/Bowel: The stomach is within normal limits. No dilated small bowel. No colon wall thickening. Lymphatic: No significantly enlarged lymph nodes. Reproductive: Slightly heterogeneous enhancement of the prostate with calcifications. Other: No free air. Small free fluid in the pelvis. Fat containing right greater than left inguinal hernia with possible small fluid in the right inguinal region. 2.4 cm subcutaneous cyst in the right gluteal region. Musculoskeletal: Acute to subacute fracture involving the L1 vertebral body with close to 50% loss of height anteriorly. No significant retropulsion. Trace anterolisthesis L4 on L5. Review of the MIP images confirms the above findings. IMPRESSION: 1. Status post endovascular repair of distal infrarenal abdominal aortic aneurysm. The excluded aneurysm sac is unchanged in size as compared with 10/19/2016 and measures 5.8 x 6 cm. Arterial phase enhancement is again  noted within the excluded sac, consistent with type 2 endoleak. 2. Large left-sided pleural effusion. Low-density lesion in the left upper lobe at the site of prior cavitary mass has further decreased in size and is felt to correspond to the radiographic nodule. No other suspicious lung nodules are seen. 3. Slightly nodular liver contour, possible cirrhosis 4. Gallstone 5. Heterogeneous enhancement of the prostate gland with calcification. 6. Small free fluid in the pelvis 7. Acute to subacute fracture involving L1 vertebral body with close to 50% loss of height anteriorly 8. No definite CT evidence for metastatic disease to the abdomen or pelvis Electronically Signed   By: Donavan Foil M.D.   On: 03/30/2018 23:06   Ir Thoracentesis Asp Pleural Space W/img Guide  Result Date: 04/04/2018 INDICATION: Patient with history of lung cancer, left pleural effusion. Request is made for diagnostic and therapeutic thoracentesis. EXAM: ULTRASOUND GUIDED DIAGNOSTIC AND THERAPEUTIC LEFT THORACENTESIS MEDICATIONS: 10 mL 1% lidocaine COMPLICATIONS: None immediate. PROCEDURE: An ultrasound guided thoracentesis was thoroughly discussed with the patient and questions answered. The benefits, risks, alternatives and complications were also discussed. The patient understands and wishes to proceed with the procedure. Written consent was obtained. Ultrasound was performed to localize and mark an adequate pocket of fluid in the left chest. The area was then prepped and draped in the normal sterile fashion. 1% Lidocaine was used for local anesthesia. Under ultrasound guidance a 6 Fr Safe-T-Centesis catheter was introduced. Thoracentesis was performed. The catheter was removed and a dressing applied. FINDINGS: A total of approximately 800 mL of amber fluid was removed. Samples were sent to the laboratory as requested by the clinical team. IMPRESSION: Successful ultrasound guided diagnostic and therapeutic left thoracentesis yielding 800  mL of pleural fluid. Read by: Sherlie Ban  Zigmund Daniel PA-C Electronically Signed   By: Aletta Edouard M.D.   On: 04/04/2018 13:04   Ir Thoracentesis Asp Pleural Space W/img Guide  Result Date: 04/03/2018 INDICATION: Patient with history of lung cancer, left pleural effusion. Request is made for therapeutic thoracentesis. EXAM: ULTRASOUND GUIDED THERAPEUTIC LEFT THORACENTESIS MEDICATIONS: 10 mL 1% lidocaine COMPLICATIONS: None immediate. PROCEDURE: An ultrasound guided thoracentesis was thoroughly discussed with the patient and questions answered. The benefits, risks, alternatives and complications were also discussed. The patient understands and wishes to proceed with the procedure. Written consent was obtained. Ultrasound was performed to localize and mark an adequate pocket of fluid in the left chest. The area was then prepped and draped in the normal sterile fashion. 1% Lidocaine was used for local anesthesia. Under ultrasound guidance a 6 Fr Safe-T-Centesis catheter was introduced. Thoracentesis was performed. The catheter was removed and a dressing applied. FINDINGS: A total of approximately 1.2 liters of amber fluid was removed. IMPRESSION: Successful ultrasound guided therapeutic left thoracentesis yielding 1.2 liters of pleural fluid. Read by: Brynda Greathouse PA-C Electronically Signed   By: Jacqulynn Cadet M.D.   On: 04/03/2018 10:13   Ir Thoracentesis Asp Pleural Space W/img Guide  Result Date: 03/31/2018 INDICATION: Patient with history of lung cancer, now with left pleural effusion. Request is made for diagnostic and therapeutic thoracentesis. EXAM: ULTRASOUND GUIDED DIAGNOSTIC AND THERAPEUTIC LEFT THORACENTESIS MEDICATIONS: 10 mL 1% lidocaine COMPLICATIONS: None immediate. PROCEDURE: An ultrasound guided thoracentesis was thoroughly discussed with the patient and questions answered. The benefits, risks, alternatives and complications were also discussed. The patient understands and wishes to proceed  with the procedure. Written consent was obtained. Ultrasound was performed to localize and mark an adequate pocket of fluid in the left chest. The area was then prepped and draped in the normal sterile fashion. 1% Lidocaine was used for local anesthesia. Under ultrasound guidance a 6 Fr Safe-T-Centesis catheter was introduced. Thoracentesis was performed. The catheter was removed and a dressing applied. FINDINGS: A total of approximately 1.3 liters of amber fluid was removed. Samples were sent to the laboratory as requested by the clinical team. IMPRESSION: Successful ultrasound guided diagnostic and therapeutic left thoracentesis yielding 1.3 liters of pleural fluid. Read by: Brynda Greathouse PA-C No pneumothorax on postprocedure radiograph. Electronically Signed   By: Lucrezia Europe M.D.   On: 03/31/2018 13:05   (Echo, Carotid, EGD, Colonoscopy, ERCP)    Subjective: No complains, tolerating his diet he relates his pain is controlled. Discharge Exam: Vitals:   04/08/18 0355 04/08/18 0930  BP: (!) 117/58 118/79  Pulse: 95 91  Resp: 17 18  Temp: 98.4 F (36.9 C) 98.6 F (37 C)  SpO2: 91% 94%     General: Pt is alert, awake, not in acute distress Cardiovascular: RRR, S1/S2 +, no rubs, no gallops Respiratory: CTA bilaterally, no wheezing, no rhonchi Abdominal: Soft, NT, ND, bowel sounds + Extremities: no edema, no cyanosis    The results of significant diagnostics from this hospitalization (including imaging, microbiology, ancillary and laboratory) are listed below for reference.     Microbiology: Recent Results (from the past 240 hour(s))  Gram stain     Status: None   Collection Time: 03/31/18 12:26 PM  Result Value Ref Range Status   Specimen Description FLUID PLEURAL LEFT  Final   Special Requests NONE  Final   Gram Stain   Final    FEW WBC PRESENT, PREDOMINANTLY MONONUCLEAR NO ORGANISMS SEEN Performed at [facility], [address]  [place], [place] [address]    Report  Status 03/31/2018 FINAL  Final  Culture, body fluid-bottle     Status: None   Collection Time: 03/31/18 12:26 PM  Result Value Ref Range Status   Specimen Description FLUID PLEURAL LEFT  Final   Special Requests NONE  Final   Culture   Final    NO GROWTH 5 DAYS Performed at [facility] [address] [place], [place] [address]    Report Status 04/05/2018 FINAL  Final  Surgical pcr screen     Status: None   Collection Time: 04/07/18  4:24 AM  Result Value Ref Range Status   MRSA, PCR NEGATIVE NEGATIVE Final   Staphylococcus aureus NEGATIVE NEGATIVE Final    Comment: (NOTE) The Xpert SA Assay (FDA approved for NASAL specimens in patients 69 years of age and older), is one component of a comprehensive surveillance program. It is not intended to diagnose infection nor to guide or monitor treatment. Performed at [facility], [place] [address] [place], [place] [address]      Labs: BNP (last 3 results) No results for input(s): BNP in the last 8760 hours. Basic Metabolic Panel: Recent Labs  Lab 04/03/18 0334 04/04/18 0554 04/05/18 0419 04/07/18 0339  NA 135 135 135 137  K 3.7 3.6 3.4* 3.5  CL 102 101 101 103  CO2 27 26 26 26   GLUCOSE 94 90 93 92  BUN 5* 7* 8 9  CREATININE 0.80 0.93 0.86 0.92  CALCIUM 7.7* 8.0* 8.1* 8.0*   Liver Function Tests: Recent Labs  Lab 04/04/18 0554 04/07/18 0339  AST 33 26  ALT 20 17  ALKPHOS 79 71  BILITOT 0.6 0.7  PROT 5.4* 5.9*  ALBUMIN 2.1* 2.2*   No results for input(s): LIPASE, AMYLASE in the last 168 hours. No results for input(s): AMMONIA in the last 168 hours. CBC: Recent Labs  Lab 04/02/18 0421 04/03/18 0334 04/04/18 0554 04/05/18 0419 04/07/18 0339  WBC 5.7  --   --   --  5.4  HGB 9.7* 9.1* 9.3* 9.3* 9.6*  HCT 30.0* 28.0* 29.1* 29.1* 29.7*  MCV 105.6*  --   --   --  104.2*  PLT 259  --   --   --  353   Cardiac Enzymes: No results for input(s): CKTOTAL, CKMB, CKMBINDEX, TROPONINI in the last 168  hours. BNP: Invalid input(s): POCBNP CBG: No results for input(s): GLUCAP in the last 168 hours. D-Dimer No results for input(s): DDIMER in the last 72 hours. Hgb A1c No results for input(s): HGBA1C in the last 72 hours. Lipid Profile No results for input(s): CHOL, HDL, LDLCALC, TRIG, CHOLHDL, LDLDIRECT in the last 72 hours. Thyroid function studies No results for input(s): TSH, T4TOTAL, T3FREE, THYROIDAB in the last 72 hours.  Invalid input(s): FREET3 Anemia work up No results for input(s): VITAMINB12, FOLATE, FERRITIN, TIBC, IRON, RETICCTPCT in the last 72 hours. Urinalysis    Component Value Date/Time   COLORURINE YELLOW 03/31/2018 0630   APPEARANCEUR CLEAR 03/31/2018 0630   LABSPEC 1.042 (H) 03/31/2018 0630   PHURINE 5.0 03/31/2018 0630   GLUCOSEU NEGATIVE 03/31/2018 0630   HGBUR SMALL (A) 03/31/2018 0630   BILIRUBINUR NEGATIVE 03/31/2018 0630   KETONESUR NEGATIVE 03/31/2018 0630   PROTEINUR NEGATIVE 03/31/2018 0630   NITRITE NEGATIVE 03/31/2018 0630   LEUKOCYTESUR NEGATIVE 03/31/2018 0630   Sepsis Labs Invalid input(s): PROCALCITONIN,  WBC,  LACTICIDVEN Microbiology Recent Results (from the past 240 hour(s))  Gram stain  Status: None   Collection Time: 03/31/18 12:26 PM  Result Value Ref Range Status   Specimen Description FLUID PLEURAL LEFT  Final   Special Requests NONE  Final   Gram Stain   Final    FEW WBC PRESENT, PREDOMINANTLY MONONUCLEAR NO ORGANISMS SEEN Performed at [facility], [address], [place], [place] [address]    Report Status 03/31/2018 FINAL  Final  Culture, body fluid-bottle     Status: None   Collection Time: 03/31/18 12:26 PM  Result Value Ref Range Status   Specimen Description FLUID PLEURAL LEFT  Final   Special Requests NONE  Final   Culture   Final    NO GROWTH 5 DAYS Performed at [facility] [address], [place], [place] [address]    Report Status 04/05/2018 FINAL  Final  Surgical pcr screen     Status:  None   Collection Time: 04/07/18  4:24 AM  Result Value Ref Range Status   MRSA, PCR NEGATIVE NEGATIVE Final   Staphylococcus aureus NEGATIVE NEGATIVE Final    Comment: (NOTE) The Xpert SA Assay (FDA approved for NASAL specimens in patients 34 years of age and older), is one component of a comprehensive surveillance program. It is not intended to diagnose infection nor to guide or monitor treatment. Performed at [facility], [place] [address] [place], [place] [address]      Time coordinating discharge: 40 minutes  SIGNED:   Charlynne Cousins, MD  [facility]

## 2018-04-08 NOTE — Progress Notes (Signed)
chest drainage done 500cc drainage, done in a sterile technique. patient feeling pain at site nearing the end clamp and allow patient to rest tried to continue patient started coughing end at that moment. Patient not in distress will continue to monitor.

## 2018-04-08 NOTE — Progress Notes (Signed)
Occupational Therapy Treatment Patient Details Name: Tyler Mcmillan MRN: [id number] DOB: Jun 19, 1945 Today's Date: 04/08/2018    History of present illness Pt is a 73 y/o male with a PMH significant for lung CA, AAA, anxiety who presents s/p fatigue, weakness, dizziness, dark stools and frequent falls for several months, acutely worsening in the last week. Pt has undergone thoracentesis and EGD.    OT comments  Pt. Seen for skilled OT.  able to complete bed mobility, in room ambulation, and demonstrate LB dressing.  Declined grooming and bathing tasks. Note d/c likely next day or so to snf.    Follow Up Recommendations  Home health OT;Supervision/Assistance - 24 hour    Equipment Recommendations  3 in 1 bedside commode    Recommendations for Other Services      Precautions / Restrictions Precautions Precautions: Fall       Mobility Bed Mobility Overal bed mobility: Modified Independent                Transfers Overall transfer level: Needs assistance Equipment used: Rolling walker (2 wheeled) Transfers: Sit to/from [facility] Sit to Stand: Supervision Stand pivot transfers: Min guard            Balance                                           ADL either performed or assessed with clinical judgement   ADL         Grooming Details (indicate cue type and reason): refused completion of tasks but requested specific items ie: mouth rinse and t.paste for later use.             Lower Body Dressing: Sitting/lateral leans Lower Body Dressing Details (indicate cue type and reason): simulated donning and actual adjustment of B socks with crossing each leg over knees Toilet Transfer: RW;Ambulation;Min guard Toilet Transfer Details (indicate cue type and reason): simulated with transfer from eob and in room ambulation around the bed to recliner         Functional mobility during ADLs: Min guard;Rolling walker General ADL  Comments: poor safety awareness and balance. very particular in how any activites are approached.      Vision       Perception     Praxis      Cognition Arousal/Alertness: Awake/alert Behavior During Therapy: Anxious                                            Exercises     Shoulder Instructions       General Comments      Pertinent Vitals/ Pain       Pain Assessment: No/denies pain  Home Living                                          Prior Functioning/Environment              Frequency  Min 2X/week        Progress Toward Goals  OT Goals(current goals can now be found in the care plan section)  Progress towards OT goals: Progressing toward goals     Plan Discharge  plan remains appropriate    Co-evaluation                 AM-PAC OT "6 Clicks" Daily Activity     Outcome Measure   Help from another person eating meals?: None Help from another person taking care of personal grooming?: A Little Help from another person toileting, which includes using toliet, bedpan, or urinal?: A Little Help from another person bathing (including washing, rinsing, drying)?: A Little Help from another person to put on and taking off regular upper body clothing?: None Help from another person to put on and taking off regular lower body clothing?: A Little 6 Click Score: 20    End of Session Equipment Utilized During Treatment: Rolling walker  OT Visit Diagnosis: Repeated falls (R29.6);Muscle weakness (generalized) (M62.81);Unsteadiness on feet (R26.81)   Activity Tolerance Patient tolerated treatment well   Patient Left in chair;with call bell/phone within reach   Nurse Communication Other (comment)(pt. wanting anti-anxiety medication)        Time: 2458-0998 OT Time Calculation (min): 14 min  Charges: OT General Charges $OT Visit: 1 Visit OT Treatments $Self Care/Home Management : 8-22 mins   Janice Coffin, COTA/L 04/08/2018, 11:13 AM

## 2018-04-08 NOTE — Care Management (Addendum)
Acknowledge consult for nurse for PleurX drain. [place] last CSW note[place] is contingent on insurance authorization, which will likely be Monday.  Nursing care will be provided at SNF. Spoke w patient, he states he was told to anticipate DC Monday.  Nursing staff- Please send patient with box of PleurX drains at DC, this will be for use until supply comes through [facility]. Needs order forms sent to [facility], spoke w Ebony Hail at [facility] who states they will submit order for PleurX catheters to [facility].    Spoke w Darylene Price bedside RN, explained patient will need box of catheters and order forms sent with him at DC.   Copy of original orders given to [facility] CM; to confirm receipt with [facility] Monday.

## 2018-04-08 NOTE — Progress Notes (Signed)
Patients' wife ask if she could have the prescriptions today so that she could fill them they were given to her.

## 2018-04-08 NOTE — Discharge Instructions (Signed)
Daily Pleurix drainage to start tomorrow  If <150 ml /drainage session x3 consecutive occasions - QOD drainage If <150 ml /drainage session x3 consecutive occasions on  QOD drainage schedule- call [facility] office  [phone]) for evaluation and possible removal

## 2018-04-08 NOTE — Progress Notes (Signed)
CXR reviewed: improvement of pleural effusion, small left apical pneumothorax   May begin daily drainage of Pleurx catheter... will place instructions in D/C instructions.  Office will see in 1 week   Ellwood Handler, PA-C

## 2018-04-09 NOTE — Progress Notes (Signed)
Patient still has not received insurance authorization. Insurance was started with [facility] on Friday. The insurance company is not open over the weekend. Most likely we will have authorization by Monday midday and the patient will be able to discharge.   CSW will continue to follow.   Domenic Schwab, MSW, [place]

## 2018-04-09 NOTE — Progress Notes (Signed)
CSW received voicemail from CSW [employer]. [employer] is requesting a peer to peer. MD can call [phone] and select option 5.   Deadline to complete peer to peer review is on Monday, March 2nd at 12:00pm.   CSW has spoken with MD and provided the information. We are awaiting a response from the insurance company.   Domenic Schwab, MSW, [place]

## 2018-04-09 NOTE — Progress Notes (Signed)
[facility] PROGRESS NOTE    Progress Note  Tyler Mcmillan  TJQ:[id number] DOB: 1946/01/05 DOA: 03/30/2018 PCP: [name], MD     Brief Narrative:   Tyler Mcmillan is an 73 y.o. male past medical history of lung cancer, AAA despite stent placement back in 2019, anxiety who presents to the emergency room with complains of fatigue weakness dizziness dark stools for several months which has worsened over the last week.  He is not on anticoagulants, but he does endorse using Advil for pain.  Came into the hospital for frequent falls and unable to move.  Assessment/Plan:   Antibiotic anemia due to Acute GI bleeding/ Acute post-hemorrhagic anemia Continue Protonix twice BID.   Awaiting skilled nursing facility placement.  Falls: Likely secondary to anemia versus cancer. Physical therapy evaluated the patient the recommended skilled nursing facility.  Chronic pain: No changes made to his medication.  Anxiety: Continue low-dose Ativan.  History of lung cancer with probable malignant left effusion: CTA showed a large pleural effusion with a low-density lesion in the left upper lobe at the side of the cavitary mass. CT surgery was consulted and recommended a Pleurx catheter placed on 04/07/2018  Acute respiratory failure with hypoxia due to left pleural effusion Improved not hypoxia. Multifactorial in the setting of anemia and pleural effusion.  History of AAA: CTA of the abdomen and pelvis was done, Dr. Truman Hayward discussed the case with the patient.  Tobacco abuse: Has no intention of quitting.  Malnutrition of moderate degree Ensure TID  Pressure injury of skin  RN Pressure Injury Documentation: Pressure Injury 03/30/18 Stage I -  Intact skin with non-blanchable redness of a localized area usually over a bony prominence. (Active)  03/30/18 2000  Location: Sacrum  Location Orientation:   Staging: Stage I -  Intact skin with non-blanchable redness of a localized area  usually over a bony prominence.  Wound Description (Comments):   Present on Admission: Yes    Estimated body mass index is 21.73 kg/m as calculated from the following:   Height as of this encounter: 5\' 10"  (1.778 m).   Weight as of this encounter: 68.7 kg. Malnutrition Type:  Nutrition Problem: Moderate Malnutrition Etiology: chronic illness(Lung Cancer, possible Metastatic)   Malnutrition Characteristics:  Signs/Symptoms: percent weight loss, mild muscle depletion Percent weight loss: 10.5 %   Nutrition Interventions:  Interventions: Ensure Enlive (each supplement provides 350kcal and 20 grams of protein)  DVT prophylaxis: lovenox Family Communication:none Disposition Plan/Barrier to D/C: Awaiting skilled nursing facility placement. Code Status:     Code Status Orders  (From admission, onward)         Start     Ordered   04/03/18 0855  Limited resuscitation (code)  Continuous    Question Answer Comment  In the event of cardiac or respiratory ARREST: Initiate Code Blue, Call Rapid Response Yes   In the event of cardiac or respiratory ARREST: Perform CPR Yes   In the event of cardiac or respiratory ARREST: Perform Intubation/Mechanical Ventilation No   In the event of cardiac or respiratory ARREST: Use NIPPV/BiPAp only if indicated Yes   In the event of cardiac or respiratory ARREST: Administer ACLS medications if indicated Yes   In the event of cardiac or respiratory ARREST: Perform Defibrillation or Cardioversion if indicated Yes      04/03/18 0854        Code Status History    Date Active Date Inactive Code Status Order ID Comments User Context   03/30/2018 1955  04/03/2018 0854 Full Code [id number]  Cristal Ford, DO Inpatient   09/08/2016 1602 09/09/2016 1520 Full Code [id number]  Orbie Hurst Inpatient   07/02/2016 1456 07/03/2016 1654 Full Code [id number]  Elwin Mocha, MD ED    Advance Directive Documentation     Most Recent Value  Type of Advance  Directive  Living will, Healthcare Power of Attorney  Pre-existing out of facility DNR order (yellow form or pink MOST form)  -  "MOST" Form in Place?  -        IV Access:    Peripheral IV   Procedures and diagnostic studies:   Dg Chest Port 1 View  Result Date: 04/08/2018 CLINICAL DATA:  Lung carcinoma EXAM: PORTABLE CHEST 1 VIEW COMPARISON:  04/06/2018 FINDINGS: Normal heart size. New left chest tube is in place. Left pleural effusion has improved. Small less than 5% left apical pneumothorax. Heterogeneous opacities throughout the left lung are unchanged. Right lung is clear. IMPRESSION: New left chest tube with improved left pleural effusion. Small left apical pneumothorax. Electronically Signed   By: Marybelle Killings M.D.   On: 04/08/2018 09:17     Medical Consultants:    None.  Anti-Infectives:   NOne  Subjective:    Tyler Mcmillan complaints feels great.  Objective:    Vitals:   04/08/18 0930 04/08/18 1829 04/08/18 2113 04/09/18 0440  BP: 118/79 118/63 134/69 123/78  Pulse: 91 (!) 101 97 93  Resp: 18 18 18 19   Temp: 98.6 F (37 C) 99.1 F (37.3 C) 98.8 F (37.1 C) 98.4 F (36.9 C)  TempSrc: Oral Oral Oral Oral  SpO2: 94% 98% 96% 94%  Weight:   68.7 kg   Height:        Intake/Output Summary (Last 24 hours) at 04/09/2018 0832 Last data filed at 04/08/2018 2113 Gross per 24 hour  Intake 540 ml  Output 500 ml  Net 40 ml   Filed Weights   04/06/18 2106 04/07/18 2049 04/08/18 2113  Weight: 75 kg 68.7 kg 68.7 kg    Exam: General exam: In no acute distress. Respiratory system: Good air movement and clear to auscultation. Cardiovascular system: S1 & S2 heard, RRR. Gastrointestinal system: Abdomen is nondistended, soft and nontender.  Central nervous system: Alert and oriented. No focal neurological deficits. Extremities: No pedal edema. Skin: No rashes, lesions or ulcers Psychiatry: Judgement and insight appear normal. Mood & affect appropriate.     Data Reviewed:    Labs: Basic Metabolic Panel: Recent Labs  Lab 04/03/18 0334 04/04/18 0554 04/05/18 0419 04/07/18 0339  NA 135 135 135 137  K 3.7 3.6 3.4* 3.5  CL 102 101 101 103  CO2 27 26 26 26   GLUCOSE 94 90 93 92  BUN 5* 7* 8 9  CREATININE 0.80 0.93 0.86 0.92  CALCIUM 7.7* 8.0* 8.1* 8.0*   GFR Estimated Creatinine Clearance: 70.5 mL/min (by C-G formula based on SCr of 0.92 mg/dL). Liver Function Tests: Recent Labs  Lab 04/04/18 0554 04/07/18 0339  AST 33 26  ALT 20 17  ALKPHOS 79 71  BILITOT 0.6 0.7  PROT 5.4* 5.9*  ALBUMIN 2.1* 2.2*   No results for input(s): LIPASE, AMYLASE in the last 168 hours. No results for input(s): AMMONIA in the last 168 hours. Coagulation profile Recent Labs  Lab 04/05/18 1159 04/07/18 0339  INR 1.1 1.1    CBC: Recent Labs  Lab 04/03/18 0334 04/04/18 0554 04/05/18 0419 04/07/18 0339  WBC  --   --   --  5.4  HGB 9.1* 9.3* 9.3* 9.6*  HCT 28.0* 29.1* 29.1* 29.7*  MCV  --   --   --  104.2*  PLT  --   --   --  353   Cardiac Enzymes: No results for input(s): CKTOTAL, CKMB, CKMBINDEX, TROPONINI in the last 168 hours. BNP (last 3 results) No results for input(s): PROBNP in the last 8760 hours. CBG: No results for input(s): GLUCAP in the last 168 hours. D-Dimer: No results for input(s): DDIMER in the last 72 hours. Hgb A1c: No results for input(s): HGBA1C in the last 72 hours. Lipid Profile: No results for input(s): CHOL, HDL, LDLCALC, TRIG, CHOLHDL, LDLDIRECT in the last 72 hours. Thyroid function studies: No results for input(s): TSH, T4TOTAL, T3FREE, THYROIDAB in the last 72 hours.  Invalid input(s): FREET3 Anemia work up: No results for input(s): VITAMINB12, FOLATE, FERRITIN, TIBC, IRON, RETICCTPCT in the last 72 hours. Sepsis Labs: Recent Labs  Lab 04/07/18 0339  WBC 5.4   Microbiology Recent Results (from the past 240 hour(s))  Gram stain     Status: None   Collection Time: 03/31/18 12:26 PM   Result Value Ref Range Status   Specimen Description FLUID PLEURAL LEFT  Final   Special Requests NONE  Final   Gram Stain   Final    FEW WBC PRESENT, PREDOMINANTLY MONONUCLEAR NO ORGANISMS SEEN Performed at [facility], [address] [place], [place] [address]    Report Status 03/31/2018 FINAL  Final  Culture, body fluid-bottle     Status: None   Collection Time: 03/31/18 12:26 PM  Result Value Ref Range Status   Specimen Description FLUID PLEURAL LEFT  Final   Special Requests NONE  Final   Culture   Final    NO GROWTH 5 DAYS Performed at [facility] [address], [place], [place] [address]    Report Status 04/05/2018 FINAL  Final  Surgical pcr screen     Status: None   Collection Time: 04/07/18  4:24 AM  Result Value Ref Range Status   MRSA, PCR NEGATIVE NEGATIVE Final   Staphylococcus aureus NEGATIVE NEGATIVE Final    Comment: (NOTE) The Xpert SA Assay (FDA approved for NASAL specimens in patients 2 years of age and older), is one component of a comprehensive surveillance program. It is not intended to diagnose infection nor to guide or monitor treatment. Performed at [facility], [place] [address], [place], [place] [address]      Medications:   . feeding supplement (ENSURE ENLIVE)  237 mL Oral TID BM  . nicotine  14 mg Transdermal Daily  . pantoprazole  40 mg Oral BID   Continuous Infusions:    LOS: 9 days   Charlynne Cousins  [facility]  04/09/2018, 8:32 AM

## 2018-04-09 NOTE — Progress Notes (Signed)
Chest drainage done. 450cc of fliuid came off. Patient is stable, however, feeling pain at site will give pain medication and continue to monitor.

## 2018-04-10 ENCOUNTER — Encounter (HOSPITAL_COMMUNITY): Payer: Self-pay | Admitting: Cardiothoracic Surgery

## 2018-04-10 DIAGNOSIS — R279 Unspecified lack of coordination: Secondary | ICD-10-CM | POA: Diagnosis not present

## 2018-04-10 DIAGNOSIS — K922 Gastrointestinal hemorrhage, unspecified: Secondary | ICD-10-CM | POA: Diagnosis not present

## 2018-04-10 DIAGNOSIS — J9621 Acute and chronic respiratory failure with hypoxia: Secondary | ICD-10-CM | POA: Diagnosis not present

## 2018-04-10 DIAGNOSIS — Z743 Need for continuous supervision: Secondary | ICD-10-CM | POA: Diagnosis not present

## 2018-04-10 DIAGNOSIS — K269 Duodenal ulcer, unspecified as acute or chronic, without hemorrhage or perforation: Secondary | ICD-10-CM | POA: Diagnosis not present

## 2018-04-10 DIAGNOSIS — J95811 Postprocedural pneumothorax: Secondary | ICD-10-CM | POA: Diagnosis not present

## 2018-04-10 DIAGNOSIS — C7802 Secondary malignant neoplasm of left lung: Secondary | ICD-10-CM | POA: Diagnosis not present

## 2018-04-10 DIAGNOSIS — C3492 Malignant neoplasm of unspecified part of left bronchus or lung: Secondary | ICD-10-CM | POA: Diagnosis not present

## 2018-04-10 DIAGNOSIS — I10 Essential (primary) hypertension: Secondary | ICD-10-CM | POA: Diagnosis not present

## 2018-04-10 DIAGNOSIS — R531 Weakness: Secondary | ICD-10-CM | POA: Diagnosis not present

## 2018-04-10 DIAGNOSIS — E440 Moderate protein-calorie malnutrition: Secondary | ICD-10-CM | POA: Diagnosis not present

## 2018-04-10 DIAGNOSIS — J90 Pleural effusion, not elsewhere classified: Secondary | ICD-10-CM | POA: Diagnosis not present

## 2018-04-10 DIAGNOSIS — Z9689 Presence of other specified functional implants: Secondary | ICD-10-CM | POA: Diagnosis not present

## 2018-04-10 DIAGNOSIS — C3412 Malignant neoplasm of upper lobe, left bronchus or lung: Secondary | ICD-10-CM | POA: Diagnosis not present

## 2018-04-10 DIAGNOSIS — W19XXXA Unspecified fall, initial encounter: Secondary | ICD-10-CM | POA: Diagnosis not present

## 2018-04-10 DIAGNOSIS — D62 Acute posthemorrhagic anemia: Secondary | ICD-10-CM | POA: Diagnosis not present

## 2018-04-10 DIAGNOSIS — R296 Repeated falls: Secondary | ICD-10-CM | POA: Diagnosis not present

## 2018-04-10 MED ORDER — CLONAZEPAM 0.5 MG PO TABS: 0.5000 mg | ORAL_TABLET | Freq: Two times a day (BID) | ORAL | 0 refills | Status: DC

## 2018-04-10 MED ORDER — OXYCODONE HCL 5 MG PO TABS: 5.0000 mg | ORAL_TABLET | ORAL | 0 refills | Status: DC | PRN

## 2018-04-10 NOTE — Clinical Social Work Placement (Addendum)
   CLINICAL SOCIAL WORK PLACEMENT  NOTE *DISCHARGED TO [facility]  Date:  04/10/2018  Patient Details  Name: Tyler Mcmillan MRN: [id number] Date of Birth: 07/16/1945  Clinical Social Work is seeking post-discharge placement for this patient at the [place] level of care (*CSW will initial, date and re-position this form in  chart as items are completed):  Yes   Patient/family provided with [place] Work Department's list of facilities offering this level of care within the geographic area requested by the patient (or if unable, by the patient's family).  Yes   Patient/family informed of their freedom to choose among providers that offer the needed level of care, that participate in Medicare, Medicaid or managed care program needed by the patient, have an available bed and are willing to accept the patient.  Yes   Patient/family informed of [facility]'s ownership interest in [facility] and [facility], as well as of the fact that they are under no obligation to receive care at these facilities.  PASRR submitted to EDS on 04/04/18     PASRR number received on 04/04/18     Existing PASRR number confirmed on       FL2 transmitted to all facilities in geographic area requested by pt/family on 04/04/18     FL2 transmitted to all facilities within larger geographic area on       Patient informed that his/her managed care company has contracts with or will negotiate with certain facilities, including the following:        Yes   Patient/family informed of bed offers received.  Patient chooses bed at  [facility]      Physician recommends and patient chooses bed at      Patient to be transferred to  [facility] on  04/10/18.  Patient to be transferred to facility by  Ambulance     Patient family notified on  04/10/18 of transfer.  Name of family member notified:   Patient reported that his wife is aware of discharge                                                           and CSW does not need to contact her.     PHYSICIAN       Additional Comment:    _______________________________________________ Sable Feil, LCSW 04/10/2018, 11:27 AM

## 2018-04-10 NOTE — Care Management Note (Signed)
Case Management Note Manya Silvas, RN MSN CCM Transitions of Care 26[facility] [place] [phone]  Patient Details  Name: Tyler Mcmillan MRN: [id number] Date of Birth: Aug 22, 1945  Subjective/Objective:      Acute GI Bleeding            Action/Plan: PTA home with spouse. Patient needing SNF placement. Discussed and provided PleurX catheter paperwork with facility liason. Box of drain bottles ordered and sent to facility via liason. Liason also transported large suitcase for patient d/t patient to be transported by [facility] and no family available. Patient to transition to [facility] today.   Expected Discharge Date:  04/08/18               Expected Discharge Plan:  [place]  In-House Referral:  Clinical Social Work  Discharge planning Services  CM Consult  Post Acute Care Choice:  NA Choice offered to:  Patient  DME Arranged:  Chest tube pluerex DME Agency:  Other - Comment([facility]-paperwork given to [place])  [place] Arranged:  NA HH Agency:  NA  Status of Service:  Completed, signed off  If discussed at [place] of Stay Meetings, dates discussed:    Additional Comments:  Bartholomew Crews, RN 04/10/2018, 12:52 PM

## 2018-04-10 NOTE — Discharge Summary (Signed)
Physician Discharge Summary  Tyler Mcmillan DGL:[id number] DOB: [birth year] DOA: 03/30/2018  PCP: [name], MD  Admit date: 03/30/2018 Discharge date: 04/10/2018  Admitted From: Home Disposition:  SNF  Recommendations for Outpatient Follow-up:  1. Follow up with PCP in 1-2 weeks 2. We will follow-up with CT surgery as an outpatient. 3. Follow-up with oncology as an outpatient to continue radiation and possibly chemotherapy as an outpatient.  Home Health:No Equipment/Devices:None  Discharge Condition:Stable CODE STATUS:Full Diet recommendation: Heart Healthy   Brief/Interim Summary: 73 y.o. male past medical history of lung cancer, AAA despite stent placement back in 2019, anxiety who presents to the emergency room with complains of fatigue weakness dizziness dark stools for several months which has worsened over the last week.  He is not on anticoagulants, but he does endorse using Advil for pain.  Came into the hospital for frequent falls and unable to move.  Discharge Diagnoses:  Active Problems:   Acute GI bleeding   Acute post-hemorrhagic anemia   Malignant neoplasm metastatic to left lung [facility])   Peripheral edema   Pleural effusion on left   Pleural effusion, left   Fall at home, initial encounter   Generalized weakness   GI bleed   Malnutrition of moderate degree   Pressure injury of skin   Acute on chronic respiratory failure with hypoxia ([name])  Acute upper GI bleed due to duodenal ulcer/acute posthemorrhagic anemia: His hemoglobin on admission was 9.2 he was transfused 2 unit of packed red blood cells. The patient endorses Advil use. GI was consulted who recommended IV Protonix and an EGD that was performed on 03/31/2018 that showed erythematous mucosa with a nonbleeding duodenal ulcer with no signs of bleeding. Cont. Protonix twice daily as an outpatient. Patient had to be in the hospital extra day for insurance approval no changes this period of time.  Mechanical  fall: Likely secondary to anemia versus cancer. Physical therapy evaluated the patient the recommended skilled nursing facility placement.  Chronic pain: No change made to his medication.  Anxiety: Continue low-dose Ativan.  History of lung cancer with pleural effusion questionable malignant: A CTA was done that showed a a cavitary mass large pleural effusion which work centesis was performed 3 times. CT surgery was consulted and Pleurx catheter was put in place on 04/07/2018 he will continue to follow-up with CT surgery as an outpatient.  Acute respiratory failure with hypoxia due to large left pleural effusion and possibly acute blood loss anemia: Now resolved, with treatment of condition as above.  History of AAA: We will follow-up with CT surgery as an outpatient.  Tobacco abuse: He has no intention of quitting.  Moderate caloric malnutrition: Continue Ensure 3 times daily.  Discharge Instructions  Discharge Instructions    Diet - low sodium heart healthy   Complete by:  As directed    Increase activity slowly   Complete by:  As directed      Allergies as of 04/10/2018   No Known Allergies     Medication List    STOP taking these medications   ibuprofen 200 MG tablet Commonly known as:  ADVIL,MOTRIN     TAKE these medications   clonazePAM 0.5 MG tablet Commonly known as:  KLONOPIN Take 1 tablet (0.5 mg total) by mouth 2 (two) times daily.   oxyCODONE 5 MG immediate release tablet Commonly known as:  Oxy IR/ROXICODONE Take 1 tablet (5 mg total) by mouth every 4 (four) hours as needed for moderate pain or severe pain.  pantoprazole 40 MG tablet Commonly known as:  PROTONIX Take 1 tablet (40 mg total) by mouth 2 (two) times daily.      Follow-up Information    [facility] [place] Follow up on 04/17/2018.   Specialty:  Cardiothoracic Surgery Why:  Office will mail you appointment date and time Contact information: [place], [place] [phone]         No Known Allergies  Consultations:  GI  CT surgery   Procedures/Studies: Dg Chest 1 View  Result Date: 04/04/2018 CLINICAL DATA:  Pleural effusion.  Follow-up thoracentesis. EXAM: CHEST  1 VIEW COMPARISON:  04/03/2018 FINDINGS: Right chest remains clear. Slightly smaller pleural effusion on the left. Persistent atelectasis in the left lung. No sign of pneumothorax. IMPRESSION: Slightly less pleural fluid on the left following thoracentesis. No pneumothorax. Persistent atelectasis. Electronically Signed   By: Nelson Chimes M.D.   On: 04/04/2018 13:18   Dg Chest 1 View  Result Date: 04/03/2018 CLINICAL DATA:  Status post left thoracentesis EXAM: CHEST  1 VIEW COMPARISON:  04/03/2018 FINDINGS: There has been interval mild decrease in pleural effusion following thoracentesis. Persistent left upper lobe mass lesion is seen. No definitive pneumothorax is seen. The right lung remains clear. Aortic calcifications are again noted. IMPRESSION: Slight decrease in left-sided pleural effusion. No evidence of pneumothorax. Electronically Signed   By: Inez Catalina M.D.   On: 04/03/2018 09:48   Dg Chest 1 View  Result Date: 03/31/2018 CLINICAL DATA:  Left thoracentesis. EXAM: CHEST  1 VIEW COMPARISON:  CT chest and chest x-ray from yesterday. FINDINGS: The left heart border remains obscured. Slight interval decrease in size of the moderate left pleural effusion. Relatively unchanged atelectasis in the left lower greater than upper lobes. Unchanged 3.4 cm mass in the left upper lobe. The right lung remains clear. No pneumothorax. No acute osseous abnormality. IMPRESSION: 1. Slight interval decrease in size of moderate left pleural effusion status post thoracentesis. No pneumothorax. 2. Relatively unchanged left lung atelectasis. 3. Unchanged 3.4 cm left upper lobe mass. Electronically Signed   By: Titus Dubin M.D.   On:  03/31/2018 12:42   Dg Chest 2 View  Result Date: 04/06/2018 CLINICAL DATA:  Left chest pain in a cigarette smoker. History of pleural effusion. Status post thoracentesis 04/04/2018. EXAM: CHEST - 2 VIEW COMPARISON:  Single-view of the chest 04/04/2018, 03/31/2018 and 04/03/2018. CT chest, abdomen and pelvis 03/30/2018. FINDINGS: Moderate to moderately large left pleural effusion has increased in size since the most recent examination. There is associated left basilar atelectasis. 3.1 cm rounded opacity in the left upper lobe is unchanged. The right lung is clear. The lungs are emphysematous. No pneumothorax. Cardiac silhouette is obscured. IMPRESSION: Moderate to moderately large left pleural effusion has increased in size since the most recent examination. No change in a 3.1 cm left upper lobe lesion. Emphysema. Electronically Signed   By: Inge Rise M.D.   On: 04/06/2018 10:33   Dg Chest 2 View  Result Date: 03/30/2018 CLINICAL DATA:  Fatigue and weakness. Reported history of lung carcinoma EXAM: CHEST - 2 VIEW COMPARISON:  May 16, 2017 FINDINGS: There is a sizable left pleural effusion. There is a nodular opacity in the anterior segment of the left upper lobe measuring 3.2 x 3.1 x 3.4 cm. There are suspected nodular lesions partially obscured by the effusion in the left perihilar region. These areas are concerning for metastases. Right lung is clear.  The heart size and pulmonary vascularity are normal. No adenopathy is appreciable by radiography. There is aortic atherosclerosis. There are foci of carotid artery calcification. There are wedge fractures in the midthoracic region. There are no blastic or lytic bone lesions. IMPRESSION: Suspect metastatic foci in left lung. There is a well-defined nodular lesion in the anterior segment left upper lobe which is certainly suspicious for metastasis. There is a large left pleural effusion. Right lung is clear. Cardiac silhouette is within normal limits.  Aortic Atherosclerosis (ICD10-I70.0). There are foci of carotid artery calcification. Given concern for metastatic disease, chest CT, ideally with intravenous contrast, may be advisable. Electronically Signed   By: Lowella Grip III M.D.   On: 03/30/2018 16:16   Dg Chest Port 1 View  Result Date: 04/08/2018 CLINICAL DATA:  Lung carcinoma EXAM: PORTABLE CHEST 1 VIEW COMPARISON:  04/06/2018 FINDINGS: Normal heart size. New left chest tube is in place. Left pleural effusion has improved. Small less than 5% left apical pneumothorax. Heterogeneous opacities throughout the left lung are unchanged. Right lung is clear. IMPRESSION: New left chest tube with improved left pleural effusion. Small left apical pneumothorax. Electronically Signed   By: Marybelle Killings M.D.   On: 04/08/2018 09:17   Dg Chest Port 1 View  Result Date: 04/03/2018 CLINICAL DATA:  Status post left thoracentesis. EXAM: PORTABLE CHEST 1 VIEW COMPARISON:  Radiograph of March 31, 2018. FINDINGS: Stable cardiomediastinal silhouette. Atherosclerosis of thoracic aorta is noted. Moderate left pleural effusion is noted with underlying atelectasis or infiltrate. No pneumothorax is noted. Stable left upper lobe mass is noted. Right lung is clear. Bony thorax is unremarkable. IMPRESSION: Stable moderate left pleural effusion is noted with underlying atelectasis or infiltrate. Stable left upper lobe mass is noted. Electronically Signed   By: Marijo Conception, M.D.   On: 04/03/2018 08:28   Dg C-arm 1-60 Min-no Report  Result Date: 04/07/2018 Fluoroscopy was utilized by the requesting physician.  No radiographic interpretation.   Ct Angio Chest Aorta W/cm &/or Wo/cm  Result Date: 03/30/2018 CLINICAL DATA:  Abnormal chest x-ray history of aortic aneurysm repair EXAM: CT ANGIOGRAPHY CHEST, ABDOMEN AND PELVIS TECHNIQUE: Multidetector CT imaging through the chest, abdomen and pelvis was performed using the standard protocol during bolus administration  of intravenous contrast. Multiplanar reconstructed images and MIPs were obtained and reviewed to evaluate the vascular anatomy. CONTRAST:  154mL ISOVUE-370 IOPAMIDOL (ISOVUE-370) INJECTION 76% COMPARISON:  Radiograph 03/30/2018, 05/16/2017, CT chest 04/21/2017, 12/08/2016, CT angiogram 10/19/2016 FINDINGS: CTA CHEST FINDINGS Cardiovascular: Non contrasted images of the chest demonstrate moderate aortic atherosclerosis. No aneurysmal dilatation. Coronary vascular calcification. Normal heart size. Trace pericardial effusion. Following contrast, no dissection is seen. Central pulmonary arteries are patent. Mediastinum/Nodes: Midline trachea. No thyroid mass. No significant adenopathy. Esophagus within normal limits. Lungs/Pleura: Large left-sided pleural effusion. Moderate emphysema. Right lung is clear. Partial atelectasis in the left lower lobe. Cystic density, corresponding to radiographic abnormality in the left upper lobe, this measures 2.9 x 2.9 cm, previously 3.1 x 3.4 cm and is in the region of the previously demonstrated cavitary lesion. Scarring in the left upper lobe. No other suspicious pulmonary nodules. Musculoskeletal: No acute acute or suspicious abnormality. Review of the MIP images confirms the above findings. CTA ABDOMEN AND PELVIS FINDINGS VASCULAR Aorta: Moderate aortic atherosclerosis. Status post endovascular repair of infrarenal abdominal aortic aneurysm just below the renal arteries to the bilateral common iliac arteries. There is graft patency. Residual aneurysm sac measures 5.8 x 6 cm as compared with  5.7 x 6 cm previously. Small enhancing vessels are again noted within the aneurysm sac. Celiac: Calcification at the origin without significant stenosis. SMA: Mild stenosis at the origin. Renals: Single right renal artery and 2 left renal arteries including a small accessory artery to the upper pole. Moderate focal stenosis at the origin of the dominant left renal artery as before. IMA: Not well  opacified at the origin. Inflow: Both internal iliac arteries are patent. Atherosclerotic disease of the external iliac arteries. Veins: No obvious venous abnormality within the limitations of this arterial phase study. Review of the MIP images confirms the above findings. NON-VASCULAR Hepatobiliary: Slightly nodular liver contour, possible cirrhosis. Calcified stone at the gallbladder neck. No biliary dilatation Pancreas: Unremarkable. No pancreatic ductal dilatation or surrounding inflammatory changes. Spleen: Within normal limits. Adrenals/Urinary Tract: Adrenal glands are unremarkable. Kidneys are normal, without renal calculi, focal lesion, or hydronephrosis. Bladder is unremarkable. Stomach/Bowel: The stomach is within normal limits. No dilated small bowel. No colon wall thickening. Lymphatic: No significantly enlarged lymph nodes. Reproductive: Slightly heterogeneous enhancement of the prostate with calcifications. Other: No free air. Small free fluid in the pelvis. Fat containing right greater than left inguinal hernia with possible small fluid in the right inguinal region. 2.4 cm subcutaneous cyst in the right gluteal region. Musculoskeletal: Acute to subacute fracture involving the L1 vertebral body with close to 50% loss of height anteriorly. No significant retropulsion. Trace anterolisthesis L4 on L5. Review of the MIP images confirms the above findings. IMPRESSION: 1. Status post endovascular repair of distal infrarenal abdominal aortic aneurysm. The excluded aneurysm sac is unchanged in size as compared with 10/19/2016 and measures 5.8 x 6 cm. Arterial phase enhancement is again noted within the excluded sac, consistent with type 2 endoleak. 2. Large left-sided pleural effusion. Low-density lesion in the left upper lobe at the site of prior cavitary mass has further decreased in size and is felt to correspond to the radiographic nodule. No other suspicious lung nodules are seen. 3. Slightly nodular  liver contour, possible cirrhosis 4. Gallstone 5. Heterogeneous enhancement of the prostate gland with calcification. 6. Small free fluid in the pelvis 7. Acute to subacute fracture involving L1 vertebral body with close to 50% loss of height anteriorly 8. No definite CT evidence for metastatic disease to the abdomen or pelvis Electronically Signed   By: Donavan Foil M.D.   On: 03/30/2018 23:06   Ct Angio Abd/pel W/ And/or W/o  Result Date: 03/30/2018 CLINICAL DATA:  Abnormal chest x-ray history of aortic aneurysm repair EXAM: CT ANGIOGRAPHY CHEST, ABDOMEN AND PELVIS TECHNIQUE: Multidetector CT imaging through the chest, abdomen and pelvis was performed using the standard protocol during bolus administration of intravenous contrast. Multiplanar reconstructed images and MIPs were obtained and reviewed to evaluate the vascular anatomy. CONTRAST:  175mL ISOVUE-370 IOPAMIDOL (ISOVUE-370) INJECTION 76% COMPARISON:  Radiograph 03/30/2018, 05/16/2017, CT chest 04/21/2017, 12/08/2016, CT angiogram 10/19/2016 FINDINGS: CTA CHEST FINDINGS Cardiovascular: Non contrasted images of the chest demonstrate moderate aortic atherosclerosis. No aneurysmal dilatation. Coronary vascular calcification. Normal heart size. Trace pericardial effusion. Following contrast, no dissection is seen. Central pulmonary arteries are patent. Mediastinum/Nodes: Midline trachea. No thyroid mass. No significant adenopathy. Esophagus within normal limits. Lungs/Pleura: Large left-sided pleural effusion. Moderate emphysema. Right lung is clear. Partial atelectasis in the left lower lobe. Cystic density, corresponding to radiographic abnormality in the left upper lobe, this measures 2.9 x 2.9 cm, previously 3.1 x 3.4 cm and is in the region of the previously demonstrated cavitary lesion.  Scarring in the left upper lobe. No other suspicious pulmonary nodules. Musculoskeletal: No acute acute or suspicious abnormality. Review of the MIP images confirms  the above findings. CTA ABDOMEN AND PELVIS FINDINGS VASCULAR Aorta: Moderate aortic atherosclerosis. Status post endovascular repair of infrarenal abdominal aortic aneurysm just below the renal arteries to the bilateral common iliac arteries. There is graft patency. Residual aneurysm sac measures 5.8 x 6 cm as compared with 5.7 x 6 cm previously. Small enhancing vessels are again noted within the aneurysm sac. Celiac: Calcification at the origin without significant stenosis. SMA: Mild stenosis at the origin. Renals: Single right renal artery and 2 left renal arteries including a small accessory artery to the upper pole. Moderate focal stenosis at the origin of the dominant left renal artery as before. IMA: Not well opacified at the origin. Inflow: Both internal iliac arteries are patent. Atherosclerotic disease of the external iliac arteries. Veins: No obvious venous abnormality within the limitations of this arterial phase study. Review of the MIP images confirms the above findings. NON-VASCULAR Hepatobiliary: Slightly nodular liver contour, possible cirrhosis. Calcified stone at the gallbladder neck. No biliary dilatation Pancreas: Unremarkable. No pancreatic ductal dilatation or surrounding inflammatory changes. Spleen: Within normal limits. Adrenals/Urinary Tract: Adrenal glands are unremarkable. Kidneys are normal, without renal calculi, focal lesion, or hydronephrosis. Bladder is unremarkable. Stomach/Bowel: The stomach is within normal limits. No dilated small bowel. No colon wall thickening. Lymphatic: No significantly enlarged lymph nodes. Reproductive: Slightly heterogeneous enhancement of the prostate with calcifications. Other: No free air. Small free fluid in the pelvis. Fat containing right greater than left inguinal hernia with possible small fluid in the right inguinal region. 2.4 cm subcutaneous cyst in the right gluteal region. Musculoskeletal: Acute to subacute fracture involving the L1 vertebral  body with close to 50% loss of height anteriorly. No significant retropulsion. Trace anterolisthesis L4 on L5. Review of the MIP images confirms the above findings. IMPRESSION: 1. Status post endovascular repair of distal infrarenal abdominal aortic aneurysm. The excluded aneurysm sac is unchanged in size as compared with 10/19/2016 and measures 5.8 x 6 cm. Arterial phase enhancement is again noted within the excluded sac, consistent with type 2 endoleak. 2. Large left-sided pleural effusion. Low-density lesion in the left upper lobe at the site of prior cavitary mass has further decreased in size and is felt to correspond to the radiographic nodule. No other suspicious lung nodules are seen. 3. Slightly nodular liver contour, possible cirrhosis 4. Gallstone 5. Heterogeneous enhancement of the prostate gland with calcification. 6. Small free fluid in the pelvis 7. Acute to subacute fracture involving L1 vertebral body with close to 50% loss of height anteriorly 8. No definite CT evidence for metastatic disease to the abdomen or pelvis Electronically Signed   By: Donavan Foil M.D.   On: 03/30/2018 23:06   Ir Thoracentesis Asp Pleural Space W/img Guide  Result Date: 04/04/2018 INDICATION: Patient with history of lung cancer, left pleural effusion. Request is made for diagnostic and therapeutic thoracentesis. EXAM: ULTRASOUND GUIDED DIAGNOSTIC AND THERAPEUTIC LEFT THORACENTESIS MEDICATIONS: 10 mL 1% lidocaine COMPLICATIONS: None immediate. PROCEDURE: An ultrasound guided thoracentesis was thoroughly discussed with the patient and questions answered. The benefits, risks, alternatives and complications were also discussed. The patient understands and wishes to proceed with the procedure. Written consent was obtained. Ultrasound was performed to localize and mark an adequate pocket of fluid in the left chest. The area was then prepped and draped in the normal sterile fashion. 1% Lidocaine was used  for local  anesthesia. Under ultrasound guidance a 6 Fr Safe-T-Centesis catheter was introduced. Thoracentesis was performed. The catheter was removed and a dressing applied. FINDINGS: A total of approximately 800 mL of amber fluid was removed. Samples were sent to the laboratory as requested by the clinical team. IMPRESSION: Successful ultrasound guided diagnostic and therapeutic left thoracentesis yielding 800 mL of pleural fluid. Read by: Brynda Greathouse PA-C Electronically Signed   By: Aletta Edouard M.D.   On: 04/04/2018 13:04   Ir Thoracentesis Asp Pleural Space W/img Guide  Result Date: 04/03/2018 INDICATION: Patient with history of lung cancer, left pleural effusion. Request is made for therapeutic thoracentesis. EXAM: ULTRASOUND GUIDED THERAPEUTIC LEFT THORACENTESIS MEDICATIONS: 10 mL 1% lidocaine COMPLICATIONS: None immediate. PROCEDURE: An ultrasound guided thoracentesis was thoroughly discussed with the patient and questions answered. The benefits, risks, alternatives and complications were also discussed. The patient understands and wishes to proceed with the procedure. Written consent was obtained. Ultrasound was performed to localize and mark an adequate pocket of fluid in the left chest. The area was then prepped and draped in the normal sterile fashion. 1% Lidocaine was used for local anesthesia. Under ultrasound guidance a 6 Fr Safe-T-Centesis catheter was introduced. Thoracentesis was performed. The catheter was removed and a dressing applied. FINDINGS: A total of approximately 1.2 liters of amber fluid was removed. IMPRESSION: Successful ultrasound guided therapeutic left thoracentesis yielding 1.2 liters of pleural fluid. Read by: Brynda Greathouse PA-C Electronically Signed   By: Jacqulynn Cadet M.D.   On: 04/03/2018 10:13   Ir Thoracentesis Asp Pleural Space W/img Guide  Result Date: 03/31/2018 INDICATION: Patient with history of lung cancer, now with left pleural effusion. Request is made for  diagnostic and therapeutic thoracentesis. EXAM: ULTRASOUND GUIDED DIAGNOSTIC AND THERAPEUTIC LEFT THORACENTESIS MEDICATIONS: 10 mL 1% lidocaine COMPLICATIONS: None immediate. PROCEDURE: An ultrasound guided thoracentesis was thoroughly discussed with the patient and questions answered. The benefits, risks, alternatives and complications were also discussed. The patient understands and wishes to proceed with the procedure. Written consent was obtained. Ultrasound was performed to localize and mark an adequate pocket of fluid in the left chest. The area was then prepped and draped in the normal sterile fashion. 1% Lidocaine was used for local anesthesia. Under ultrasound guidance a 6 Fr Safe-T-Centesis catheter was introduced. Thoracentesis was performed. The catheter was removed and a dressing applied. FINDINGS: A total of approximately 1.3 liters of amber fluid was removed. Samples were sent to the laboratory as requested by the clinical team. IMPRESSION: Successful ultrasound guided diagnostic and therapeutic left thoracentesis yielding 1.3 liters of pleural fluid. Read by: Brynda Greathouse PA-C No pneumothorax on postprocedure radiograph. Electronically Signed   By: Lucrezia Europe M.D.   On: 03/31/2018 13:05   (Echo, Carotid, EGD, Colonoscopy, ERCP)    Subjective: No complains, tolerating his diet he relates his pain is controlled. Discharge Exam: Vitals:   04/10/18 0457 04/10/18 0859  BP: (!) 152/73 104/66  Pulse: (!) 110 (!) 108  Resp: 20 18  Temp: 98.2 F (36.8 C) 98.2 F (36.8 C)  SpO2: 99% 97%     General: Pt is alert, awake, not in acute distress Cardiovascular: RRR, S1/S2 +, no rubs, no gallops Respiratory: CTA bilaterally, no wheezing, no rhonchi Abdominal: Soft, NT, ND, bowel sounds + Extremities: no edema, no cyanosis    The results of significant diagnostics from this hospitalization (including imaging, microbiology, ancillary and laboratory) are listed below for reference.      Microbiology: Recent Results (from  the past 240 hour(s))  Gram stain     Status: None   Collection Time: 03/31/18 12:26 PM  Result Value Ref Range Status   Specimen Description FLUID PLEURAL LEFT  Final   Special Requests NONE  Final   Gram Stain   Final    FEW WBC PRESENT, PREDOMINANTLY MONONUCLEAR NO ORGANISMS SEEN Performed at [facility], [address], [place], [place] [address]    Report Status 03/31/2018 FINAL  Final  Culture, body fluid-bottle     Status: None   Collection Time: 03/31/18 12:26 PM  Result Value Ref Range Status   Specimen Description FLUID PLEURAL LEFT  Final   Special Requests NONE  Final   Culture   Final    NO GROWTH 5 DAYS Performed at [facility] [address], [place], [place] [id number]    Report Status 04/05/2018 FINAL  Final  Surgical pcr screen     Status: None   Collection Time: 04/07/18  4:24 AM  Result Value Ref Range Status   MRSA, PCR NEGATIVE NEGATIVE Final   Staphylococcus aureus NEGATIVE NEGATIVE Final    Comment: (NOTE) The Xpert SA Assay (FDA approved for NASAL specimens in patients 56 years of age and older), is one component of a comprehensive surveillance program. It is not intended to diagnose infection nor to guide or monitor treatment. Performed at [facility], [place] [address] [place], [place] [address]      Labs: BNP (last 3 results) No results for input(s): BNP in the last 8760 hours. Basic Metabolic Panel: Recent Labs  Lab 04/04/18 0554 04/05/18 0419 04/07/18 0339  NA 135 135 137  K 3.6 3.4* 3.5  CL 101 101 103  CO2 26 26 26   GLUCOSE 90 93 92  BUN 7* 8 9  CREATININE 0.93 0.86 0.92  CALCIUM 8.0* 8.1* 8.0*   Liver Function Tests: Recent Labs  Lab 04/04/18 0554 04/07/18 0339  AST 33 26  ALT 20 17  ALKPHOS 79 71  BILITOT 0.6 0.7  PROT 5.4* 5.9*  ALBUMIN 2.1* 2.2*   No results for input(s): LIPASE, AMYLASE in the last 168 hours. No results for input(s): AMMONIA in the last  168 hours. CBC: Recent Labs  Lab 04/04/18 0554 04/05/18 0419 04/07/18 0339  WBC  --   --  5.4  HGB 9.3* 9.3* 9.6*  HCT 29.1* 29.1* 29.7*  MCV  --   --  104.2*  PLT  --   --  353   Cardiac Enzymes: No results for input(s): CKTOTAL, CKMB, CKMBINDEX, TROPONINI in the last 168 hours. BNP: Invalid input(s): POCBNP CBG: No results for input(s): GLUCAP in the last 168 hours. D-Dimer No results for input(s): DDIMER in the last 72 hours. Hgb A1c No results for input(s): HGBA1C in the last 72 hours. Lipid Profile No results for input(s): CHOL, HDL, LDLCALC, TRIG, CHOLHDL, LDLDIRECT in the last 72 hours. Thyroid function studies No results for input(s): TSH, T4TOTAL, T3FREE, THYROIDAB in the last 72 hours.  Invalid input(s): FREET3 Anemia work up No results for input(s): VITAMINB12, FOLATE, FERRITIN, TIBC, IRON, RETICCTPCT in the last 72 hours. Urinalysis    Component Value Date/Time   COLORURINE YELLOW 03/31/2018 0630   APPEARANCEUR CLEAR 03/31/2018 0630   LABSPEC 1.042 (H) 03/31/2018 0630   PHURINE 5.0 03/31/2018 0630   GLUCOSEU NEGATIVE 03/31/2018 0630   HGBUR SMALL (A) 03/31/2018 0630   BILIRUBINUR NEGATIVE 03/31/2018 0630   KETONESUR NEGATIVE 03/31/2018 0630  PROTEINUR NEGATIVE 03/31/2018 0630   NITRITE NEGATIVE 03/31/2018 0630   LEUKOCYTESUR NEGATIVE 03/31/2018 0630   Sepsis Labs Invalid input(s): PROCALCITONIN,  WBC,  LACTICIDVEN Microbiology Recent Results (from the past 240 hour(s))  Gram stain     Status: None   Collection Time: 03/31/18 12:26 PM  Result Value Ref Range Status   Specimen Description FLUID PLEURAL LEFT  Final   Special Requests NONE  Final   Gram Stain   Final    FEW WBC PRESENT, PREDOMINANTLY MONONUCLEAR NO ORGANISMS SEEN Performed at [facility], [address] [place], [place] [address]    Report Status 03/31/2018 FINAL  Final  Culture, body fluid-bottle     Status: None   Collection Time: 03/31/18 12:26 PM  Result Value Ref  Range Status   Specimen Description FLUID PLEURAL LEFT  Final   Special Requests NONE  Final   Culture   Final    NO GROWTH 5 DAYS Performed at [place] [address], [place], [place] [address]    Report Status 04/05/2018 FINAL  Final  Surgical pcr screen     Status: None   Collection Time: 04/07/18  4:24 AM  Result Value Ref Range Status   MRSA, PCR NEGATIVE NEGATIVE Final   Staphylococcus aureus NEGATIVE NEGATIVE Final    Comment: (NOTE) The Xpert SA Assay (FDA approved for NASAL specimens in patients 56 years of age and older), is one component of a comprehensive surveillance program. It is not intended to diagnose infection nor to guide or monitor treatment. Performed at [facility], [place] [address] [place], [place] [address]      Time coordinating discharge: 40 minutes  SIGNED:   Charlynne Cousins, MD  [facility]

## 2018-04-10 NOTE — Anesthesia Postprocedure Evaluation (Signed)
Anesthesia Post Note  Patient: Tyler Mcmillan  Procedure(s) Performed: INSERTION PLEURAL DRAINAGE CATHETER USING ULTRASOUND AND FLURO GUIDANCE (Left )     Patient location during evaluation: PACU Anesthesia Type: MAC Level of consciousness: awake and alert Pain management: pain level controlled Vital Signs Assessment: post-procedure vital signs reviewed and stable Respiratory status: spontaneous breathing, nonlabored ventilation, respiratory function stable and patient connected to nasal cannula oxygen Cardiovascular status: stable and blood pressure returned to baseline Postop Assessment: no apparent nausea or vomiting Anesthetic complications: no    Last Vitals:  Vitals:   04/10/18 0457 04/10/18 0859  BP: (!) 152/73 104/66  Pulse: (!) 110 (!) 108  Resp: 20 18  Temp: 36.8 C 36.8 C  SpO2: 99% 97%    Last Pain:  Vitals:   04/10/18 1315  TempSrc:   PainSc: 6                  Raven Furnas P Cleone Hulick

## 2018-04-10 NOTE — Progress Notes (Signed)
Patient Discharge: Disposition: Patient discharged to [facility]. Given report to Elmyra Ricks (nurse) at the facility and answered all her questions. IV: Discontinued IV before discharge. Telemetry: N/A Transportation: Patient transported out of the unit via [facility]. Belongings: Patient took all his belongings with him.

## 2018-04-11 NOTE — Op Note (Signed)
NAMEARIEON, CORCORAN MEDICAL RECORD CW:[id number] ACCOUNT [id number] DATE OF BIRTH:1945-07-23 FACILITY: [facility] LOCATION: [place], [place]  OPERATIVE REPORT  DATE OF PROCEDURE:  04/07/2018  PREOPERATIVE DIAGNOSIS:  Recurrent left pleural effusion.  POSTOPERATIVE DIAGNOSIS:  Recurrent left pleural effusion.  SURGICAL PROCEDURE:  Placement of left PleurX catheter with fluoroscopic and ultrasound guidance.  SURGEON:  Lanelle Bal, MD  BRIEF HISTORY:  The patient is a 73 year old male with previously treated left lung primary carcinoma.  The patient has been followed by radiation therapy after stereotactic radiotherapy to a left upper lobe lung mass that was biopsy proven to be  nonsmall-cell carcinoma of the lung.  The patient had begun having increasing shortness of breath in over a week.  He had a thoracentesis done on 2 occasions, draining pleural fluid without evidence of infection.  To date, cytologies have been negative.   Because of the recurrent pleural effusion, it was recommended to the patient to place a PleurX catheter for drainage.  Risks and options were discussed, and the patient was agreeable and signed informed consent.  DESCRIPTION OF PROCEDURE:  The patient was brought to the operating room with the left side preoperatively marked, and then under MAC anesthesia, had the left chest prepped with Betadine, draped in a sterile manner.  Using SonoSite ultrasound, the lung  edge and pleural fluid were easily seen.  Appropriate timeout was performed, and then we infiltrated 1% lidocaine at approximately the 6th intercostal space along the midaxillary line.  Through this site, a small incision was made and a 16-gauge needle  introduced into the left pleural space with easy return of pleural fluid.  A guidewire under fluoroscopic guidance was placed into the left pleural space.  We then made a small counter incision anteriorly, and the PleurX catheter was  tunneled.  With the  guidewire in place, the dilator was placed over the wire to enlarge the tract.  A peel-away sheath over dilator was then introduced into the left chest.  The PleurX catheter was then passed over the guidewire and positioned into the left pleural space  and was found to be in good position fluoroscopically.  The guidewire was then removed, and 800 mL of straw-colored pleural fluid was removed.  Fluoroscopy showed good position of the catheter.  The insertion site was closed with an interrupted 4-0  Vicryl.  Dermabond was applied.  Dressings were applied.  Sponge and needle count was reported as correct at the completion of the procedure.  Blood loss was minimal.  Lidocaine 1% 8 mL was used.  The patient tolerated the procedure without obvious  complication and was transferred to the recovery room for postoperative observation.  LN/NUANCE  D:04/11/2018 T:04/11/2018 JOB:[id number]

## 2018-04-13 DIAGNOSIS — J90 Pleural effusion, not elsewhere classified: Secondary | ICD-10-CM | POA: Diagnosis not present

## 2018-04-13 DIAGNOSIS — K922 Gastrointestinal hemorrhage, unspecified: Secondary | ICD-10-CM | POA: Diagnosis not present

## 2018-04-13 DIAGNOSIS — R296 Repeated falls: Secondary | ICD-10-CM | POA: Diagnosis not present

## 2018-04-13 DIAGNOSIS — R531 Weakness: Secondary | ICD-10-CM | POA: Diagnosis not present

## 2018-04-14 ENCOUNTER — Other Ambulatory Visit: Payer: Self-pay | Admitting: Cardiothoracic Surgery

## 2018-04-14 DIAGNOSIS — J90 Pleural effusion, not elsewhere classified: Secondary | ICD-10-CM

## 2018-04-17 ENCOUNTER — Ambulatory Visit
Admission: RE | Admit: 2018-04-17 | Discharge: 2018-04-17 | Disposition: A | Discharge status: Home / Self Care | Payer: Medicare Other | Source: Ambulatory Visit | Attending: Cardiothoracic Surgery | Admitting: Cardiothoracic Surgery

## 2018-04-17 ENCOUNTER — Ambulatory Visit (INDEPENDENT_AMBULATORY_CARE_PROVIDER_SITE_OTHER): Payer: Medicare Other | Admitting: Physician Assistant

## 2018-04-17 ENCOUNTER — Encounter: Payer: Self-pay | Admitting: Physician Assistant

## 2018-04-17 VITALS — BP 116/64 | HR 102 | Resp 20 | Ht 70.0 in | Wt 157.0 lb

## 2018-04-17 DIAGNOSIS — C3412 Malignant neoplasm of upper lobe, left bronchus or lung: Secondary | ICD-10-CM | POA: Diagnosis not present

## 2018-04-17 DIAGNOSIS — J90 Pleural effusion, not elsewhere classified: Secondary | ICD-10-CM

## 2018-04-17 DIAGNOSIS — J95811 Postprocedural pneumothorax: Secondary | ICD-10-CM

## 2018-04-17 NOTE — Patient Instructions (Signed)
Please continue to drain left Pleur x catheter everyday and record output. Please bring a copy of the output to next appointment. If Pleur X output  <150 ml /drainage session x3 consecutive occasions - QOD drainage; If <150 ml /drainage session x3 consecutive occasions on QOD drainage schedule- call [facility] office  [phone]) for evaluation and possible removal

## 2018-04-17 NOTE — Progress Notes (Signed)
  HPI:  Patient returns for routine postoperative follow-up having undergone a left Pleur X catheter by Dr. Servando Snare on 04/07/2018.  Patient states his breathing is "pretty good". He has intermittent pain left side near Pleur X catheter.   Current Outpatient Medications  Medication Sig Dispense Refill  . clonazePAM (KLONOPIN) 0.5 MG tablet Take 1 tablet (0.5 mg total) by mouth 2 (two) times daily. 10 tablet 0  . oxyCODONE (OXY IR/ROXICODONE) 5 MG immediate release tablet Take 1 tablet (5 mg total) by mouth every 4 (four) hours as needed for moderate pain or severe pain. 10 tablet 0  . pantoprazole (PROTONIX) 40 MG tablet Take 1 tablet (40 mg total) by mouth 2 (two) times daily. 60 tablet 3  Vital Signs: BP 116/64, HR 102, RR 20, Oxygenation 97% on room air   Physical Exam: CV- Slightly tachycardic Pulmonary- Diminished left base Wounds-Left Pleur X dressing is clean and dry  Diagnostic Tests: CLINICAL DATA:  73 y/o M; left-sided pleural effusion post thoracentesis 03/15/2018 and chest tube 04/07/2018. Left lung cancer.  EXAM: CHEST - 2 VIEW  COMPARISON:  04/08/2018 chest radiograph  FINDINGS: Stable cardiac silhouette. Aortic atherosclerosis with calcification. Left upper lobe mass is stable. Mild increase in moderate left-sided pleural effusion with chest tube in situ. No pneumothorax. Emphysema within the left chest wall likely related chest tube placement. Recent left fourth and fifth posterolateral rib fractures are stable.  IMPRESSION: Mild increase in moderate left-sided pleural effusion with chest tube in situ. No pneumothorax. Stable left upper lobe mass. Stable left fourth and fifth posterolateral rib fractures.   Electronically Signed   By: Kristine Garbe M.D.   On: 04/17/2018 14:05  Impression and Plan: Tyler Mcmillan states his breathing has been good. He is still losing weight. He states the food is not good but he drinks several protein shakes a  day. The facility did not give him a copy of the output from the left Pleur X catheter. I have asked them to please include output results with next paperwork. CXR today shows increase in left pleural effusion. Patient states it was drained yesterday but not today yet. I asked him to have facility drain it today. Daily drainage is to continue until output is < 150 cc on 3 consecutive drainage occasions - QOD drainage; If <150 ml /drainage session x3 consecutive occasions on  QOD drainage schedule- call [facility] office  [phone]) for evaluation and possible removal. Patient asked if we could estimate when Pleur X catheter would be removed and I said no because it depends how long it takes for drainage to decrease. Regarding his tachycardia, patient states he is nervous and he used to be on Atenolol for panic attacks but has not had any in awhile. His blood pressure and HR at the facility he states has been "good". He will return to see Dr. Servando Snare in 2-3 weeks and have a PA/LAT CXR taken prior.     Nani Skillern, PA-C [facility] [phone]

## 2018-04-18 ENCOUNTER — Telehealth: Payer: Self-pay | Admitting: Internal Medicine

## 2018-04-18 NOTE — Telephone Encounter (Signed)
Scheduled appt per 3/09 sch message - unable to reach patient - left message and sent reminder letter in the mail.

## 2018-04-19 DIAGNOSIS — C3492 Malignant neoplasm of unspecified part of left bronchus or lung: Secondary | ICD-10-CM | POA: Diagnosis not present

## 2018-04-19 DIAGNOSIS — E440 Moderate protein-calorie malnutrition: Secondary | ICD-10-CM | POA: Diagnosis not present

## 2018-04-19 DIAGNOSIS — K922 Gastrointestinal hemorrhage, unspecified: Secondary | ICD-10-CM | POA: Diagnosis not present

## 2018-04-19 DIAGNOSIS — K269 Duodenal ulcer, unspecified as acute or chronic, without hemorrhage or perforation: Secondary | ICD-10-CM | POA: Diagnosis not present

## 2018-04-24 ENCOUNTER — Encounter: Payer: Self-pay | Admitting: *Deleted

## 2018-04-24 ENCOUNTER — Telehealth: Payer: Self-pay | Admitting: Medical Oncology

## 2018-04-24 DIAGNOSIS — J90 Pleural effusion, not elsewhere classified: Secondary | ICD-10-CM | POA: Diagnosis not present

## 2018-04-24 DIAGNOSIS — C3492 Malignant neoplasm of unspecified part of left bronchus or lung: Secondary | ICD-10-CM | POA: Diagnosis not present

## 2018-04-24 DIAGNOSIS — K269 Duodenal ulcer, unspecified as acute or chronic, without hemorrhage or perforation: Secondary | ICD-10-CM | POA: Diagnosis not present

## 2018-04-24 DIAGNOSIS — K922 Gastrointestinal hemorrhage, unspecified: Secondary | ICD-10-CM | POA: Diagnosis not present

## 2018-04-24 NOTE — Progress Notes (Signed)
Oncology Nurse Navigator Documentation  Oncology Nurse Navigator Flowsheets 04/24/2018  Navigator Location [facility]  Referral date to RadOnc/MedOnc -  Navigator Encounter Type Other/I received a message from thoracic office that patient is complaining of pain and believes it is his cancer.  I spoke with Dr. [name] and he states patient does not have cancer at this time.  I followed up on recent cytology and no malignancy found.  I called [place] and spoke with the nurse taking care of him.  She updated me that patient is seeing NP today and will update the NP on no malignancy found at this time.  She was thankful for the update.  I then updated thoracic office with the above information.   Telephone -  Treatment Phase Follow-up  Barriers/Navigation Needs Coordination of Care  Education -  Interventions Coordination of Care  Coordination of Care Other  Education Method -  Acuity Level 3  Time Spent with Patient 45

## 2018-04-24 NOTE — Telephone Encounter (Signed)
I can see him May 02, 2018 but I am not sure if the pain he has is cancer related.  The cytology of the pleural fluid was negative for cancer.

## 2018-04-24 NOTE — Telephone Encounter (Signed)
Wants to move up appt from 05/17/2018. Persistent pain left breast "shooting /stabbing to his back and under left arm". He is going to see Nursing home provider today about increasing his pain med.

## 2018-04-25 ENCOUNTER — Telehealth: Payer: Self-pay | Admitting: Internal Medicine

## 2018-04-25 NOTE — Telephone Encounter (Signed)
Scheduling message sent. 

## 2018-04-25 NOTE — Telephone Encounter (Signed)
R/s appt per 3/17 sch message - pt aware of appt date and time

## 2018-05-01 DIAGNOSIS — K269 Duodenal ulcer, unspecified as acute or chronic, without hemorrhage or perforation: Secondary | ICD-10-CM | POA: Diagnosis not present

## 2018-05-01 DIAGNOSIS — Z9181 History of falling: Secondary | ICD-10-CM | POA: Diagnosis not present

## 2018-05-01 DIAGNOSIS — F419 Anxiety disorder, unspecified: Secondary | ICD-10-CM | POA: Diagnosis not present

## 2018-05-01 DIAGNOSIS — G894 Chronic pain syndrome: Secondary | ICD-10-CM | POA: Diagnosis not present

## 2018-05-01 DIAGNOSIS — E440 Moderate protein-calorie malnutrition: Secondary | ICD-10-CM | POA: Diagnosis not present

## 2018-05-01 DIAGNOSIS — J9621 Acute and chronic respiratory failure with hypoxia: Secondary | ICD-10-CM | POA: Diagnosis not present

## 2018-05-01 DIAGNOSIS — Z87891 Personal history of nicotine dependence: Secondary | ICD-10-CM | POA: Diagnosis not present

## 2018-05-01 DIAGNOSIS — G8929 Other chronic pain: Secondary | ICD-10-CM | POA: Diagnosis not present

## 2018-05-01 DIAGNOSIS — Z955 Presence of coronary angioplasty implant and graft: Secondary | ICD-10-CM | POA: Diagnosis not present

## 2018-05-01 DIAGNOSIS — Z4801 Encounter for change or removal of surgical wound dressing: Secondary | ICD-10-CM | POA: Diagnosis not present

## 2018-05-01 DIAGNOSIS — J90 Pleural effusion, not elsewhere classified: Secondary | ICD-10-CM | POA: Diagnosis not present

## 2018-05-01 DIAGNOSIS — C3492 Malignant neoplasm of unspecified part of left bronchus or lung: Secondary | ICD-10-CM | POA: Diagnosis not present

## 2018-05-01 DIAGNOSIS — Z48813 Encounter for surgical aftercare following surgery on the respiratory system: Secondary | ICD-10-CM | POA: Diagnosis not present

## 2018-05-02 ENCOUNTER — Other Ambulatory Visit: Payer: Medicare Other

## 2018-05-02 ENCOUNTER — Telehealth: Payer: Self-pay | Admitting: Medical Oncology

## 2018-05-02 ENCOUNTER — Ambulatory Visit: Payer: Medicare Other | Admitting: Internal Medicine

## 2018-05-02 NOTE — Telephone Encounter (Signed)
As previously scheduled.

## 2018-05-02 NOTE — Telephone Encounter (Signed)
He now has transportation and wants apt with [facility]. Schedule message sent.

## 2018-05-05 ENCOUNTER — Telehealth: Payer: Self-pay

## 2018-05-05 NOTE — Telephone Encounter (Signed)
Nicki Reaper, RN with [facility] contacted the office [phone] requesting orders for Tyler Mcmillan PleurX drainage.  Patient has had 3 consecutive drains at 25-30 ml's of less.  I advised to decrease his drainage schedule to one a week and give the office a call back if the drainage increases or if he has shortness of breath in between drains.  If patient has less than 150 ml's out from Pleurx drainaging once a week for 3 consecutive times, to give the office a call back for further instruction.  He acknowledged receipt.  He inquired about the patient's follow-up appointment.  I did advise that we cancelled his appointment in April due to COVID-19 and for patient to give the office a call back in about a month if he has not heard from [place].  He acknowledged receipt.  Verbal orders given for once weekly drainage with 3 prn visits to patient's home.  Will await faxed copy for physician signature.  Home health to see patient again next week, Wednesday, 05/10/2018.

## 2018-05-11 ENCOUNTER — Ambulatory Visit: Payer: Medicare Other | Admitting: Cardiothoracic Surgery

## 2018-05-15 ENCOUNTER — Other Ambulatory Visit: Payer: Self-pay | Admitting: Medical Oncology

## 2018-05-15 DIAGNOSIS — C3492 Malignant neoplasm of unspecified part of left bronchus or lung: Secondary | ICD-10-CM

## 2018-05-16 ENCOUNTER — Inpatient Hospital Stay: Payer: Medicare Other | Attending: Internal Medicine | Admitting: Internal Medicine

## 2018-05-16 ENCOUNTER — Encounter: Payer: Self-pay | Admitting: Internal Medicine

## 2018-05-16 ENCOUNTER — Other Ambulatory Visit: Payer: Self-pay

## 2018-05-16 ENCOUNTER — Inpatient Hospital Stay: Payer: Medicare Other

## 2018-05-16 DIAGNOSIS — J90 Pleural effusion, not elsewhere classified: Secondary | ICD-10-CM | POA: Diagnosis not present

## 2018-05-16 DIAGNOSIS — C3490 Malignant neoplasm of unspecified part of unspecified bronchus or lung: Secondary | ICD-10-CM

## 2018-05-16 DIAGNOSIS — R079 Chest pain, unspecified: Secondary | ICD-10-CM | POA: Insufficient documentation

## 2018-05-16 DIAGNOSIS — C3492 Malignant neoplasm of unspecified part of left bronchus or lung: Secondary | ICD-10-CM

## 2018-05-16 DIAGNOSIS — C3412 Malignant neoplasm of upper lobe, left bronchus or lung: Secondary | ICD-10-CM | POA: Insufficient documentation

## 2018-05-16 LAB — CMP (CANCER CENTER ONLY)
ALT: 9 U/L (ref 0–44)
AST: 21 U/L (ref 15–41)
Albumin: 2.9 g/dL — ABNORMAL LOW (ref 3.5–5.0)
Alkaline Phosphatase: 94 U/L (ref 38–126)
Anion gap: 10 (ref 5–15)
BUN: 10 mg/dL (ref 8–23)
CO2: 26 mmol/L (ref 22–32)
Calcium: 9.3 mg/dL (ref 8.9–10.3)
Chloride: 100 mmol/L (ref 98–111)
Creatinine: 0.94 mg/dL (ref 0.61–1.24)
GFR, Est AFR Am: >60 mL/min (ref >60)
GFR, Estimated: >60 mL/min (ref >60)
Glucose, Bld: 121 mg/dL — ABNORMAL HIGH (ref 70–99)
Potassium: 4.7 mmol/L (ref 3.5–5.1)
Sodium: 136 mmol/L (ref 135–145)
Total Bilirubin: 0.5 mg/dL (ref 0.3–1.2)
Total Protein: 7.9 g/dL (ref 6.5–8.1)

## 2018-05-16 LAB — CBC WITH DIFFERENTIAL (CANCER CENTER ONLY)
Abs Immature Granulocytes: 0.02 10*3/uL (ref 0.00–0.07)
Basophils Absolute: 0.1 10*3/uL (ref 0.0–0.1)
Basophils Relative: 1 %
Eosinophils Absolute: 0.3 10*3/uL (ref 0.0–0.5)
Eosinophils Relative: 4 %
HCT: 35.6 % — ABNORMAL LOW (ref 39.0–52.0)
Hemoglobin: 11 g/dL — ABNORMAL LOW (ref 13.0–17.0)
Immature Granulocytes: 0 %
Lymphocytes Relative: 9 %
Lymphs Abs: 0.7 10*3/uL (ref 0.7–4.0)
MCH: 28.6 pg (ref 26.0–34.0)
MCHC: 30.9 g/dL (ref 30.0–36.0)
MCV: 92.7 fL (ref 80.0–100.0)
Monocytes Absolute: 0.8 10*3/uL (ref 0.1–1.0)
Monocytes Relative: 11 %
Neutro Abs: 5.9 10*3/uL (ref 1.7–7.7)
Neutrophils Relative %: 75 %
Platelet Count: 367 10*3/uL (ref 150–400)
RBC: 3.84 MIL/uL — ABNORMAL LOW (ref 4.22–5.81)
RDW: 16 % — ABNORMAL HIGH (ref 11.5–15.5)
WBC Count: 7.7 10*3/uL (ref 4.0–10.5)
nRBC: 0 % (ref 0.0–0.2)

## 2018-05-16 NOTE — Progress Notes (Signed)
[place] Telephone:(336) [phone]   Fax:(336) [place], [place] [address] [place] [address]  DIAGNOSIS: Stage IB (T2a, N0, M0) non-small cell lung cancer, squamous cell carcinoma presented with cavitary left upper lobe lung mass diagnosed in June 2018.  PRIOR THERAPY: Curative SBRT to the left lung mass under the care of Dr. [name] completed October 28, 2016.  CURRENT THERAPY: Observation.  INTERVAL HISTORY: Tyler Mcmillan 73 y.o. male returns to the clinic today for follow-up visit.  The patient was last seen in July 2018 for initial consultation.  He was lost to follow-up since that time.  He was recently admitted to [facility] with shortness of breath and was found to have recurrent left pleural effusion.  He underwent thoracentesis few times and the cytology was negative for malignancy.  He also underwent left Pleurx catheter placement by Dr. Servando Snare.  He has minimal drainage of the pleural fluid at this point.  He continues to have pain on the left side of the chest that has been going on even before the placement of the Pleurx catheter.  He denied having any current shortness of breath, cough or hemoptysis.  He denied having any fever or chills.  He has no nausea, vomiting, diarrhea or constipation.  He is here today for reevaluation regarding his condition.  MEDICAL HISTORY: Past Medical History:  Diagnosis Date  . AAA (abdominal aortic aneurysm) without rupture ([place])    [place] ABD.  DONE  JUNE 2014  3.5  . Bladder cancer ([place])   . Bladder neoplasm   . Chronic anxiety   . GERD (gastroesophageal reflux disease)   . Hypertension   . 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  MEDICATIONS:  Current Outpatient Medications  Medication Sig Dispense Refill  . atenolol (TENORMIN) 50 MG tablet Take 50 mg by mouth daily.    . clonazePAM  (KLONOPIN) 0.5 MG tablet Take 1 tablet (0.5 mg total) by mouth 2 (two) times daily. 10 tablet 0  . Ensure Plus (ENSURE PLUS) LIQD Take 1 Can by mouth 2 (two) times daily between meals.    . nicotine (NICODERM CQ - DOSED IN MG/24 HOURS) 21 mg/24hr patch Place 21 mg onto the skin daily.    [name] oxyCODONE (OXY IR/ROXICODONE) 5 MG immediate release tablet Take 1 tablet (5 mg total) by mouth every 4 (four) hours as needed for moderate pain or severe pain. 10 tablet 0  . pantoprazole (PROTONIX) 40 MG tablet Take 1 tablet (40 mg total) by mouth 2 (two) times daily. 60 tablet 3   No current facility-administered medications for this visit.     SURGICAL HISTORY:  Past Surgical History:  Procedure Laterality Date  . ABDOMINAL AORTIC ENDOVASCULAR STENT GRAFT N/A 09/08/2016   Procedure: ABDOMINAL AORTIC ENDOVASCULAR STENT GRAFT;  Surgeon: [name], MD;  Location: [place];  Service: Vascular;  Laterality: N/A;  . BIOPSY  03/31/2018   Procedure: BIOPSY;  Surgeon: Wonda Horner, MD;  Location: [facility] ENDOSCOPY;  Service: Endoscopy;;  . CHEST TUBE INSERTION Left 04/07/2018   Procedure: INSERTION PLEURAL DRAINAGE CATHETER USING ULTRASOUND AND FLURO GUIDANCE;  Surgeon: Grace Isaac, MD;  Location: [place];  Service: Thoracic;  Laterality: Left;  . CYSTOSCOPY W/ URETERAL STENT PLACEMENT Left 10/04/2012   Procedure: CYSTOSCOPY WITH RETROGRADE PYELOGRAM/URETERAL STENT PLACEMENT   "POSSIBLE LEFT STENT";  Surgeon: Alexis Frock, MD;  Location: [facility];  Service: Urology;  Laterality: Left;  . ESOPHAGOGASTRODUODENOSCOPY (EGD) WITH PROPOFOL N/A 03/31/2018   Procedure: ESOPHAGOGASTRODUODENOSCOPY (EGD) WITH PROPOFOL;  Surgeon: Wonda Horner, MD;  Location: [facility] ENDOSCOPY;  Service: Endoscopy;  Laterality: N/A;  . IR THORACENTESIS ASP PLEURAL SPACE W/IMG GUIDE  03/31/2018  . IR THORACENTESIS ASP PLEURAL SPACE W/IMG GUIDE  04/03/2018  . IR THORACENTESIS ASP PLEURAL SPACE W/IMG GUIDE  04/04/2018  . LEFT HEART  CATH AND CORONARY ANGIOGRAPHY N/A 07/02/2016   Procedure: Left Heart Cath and Coronary Angiography;  Surgeon: Martinique, Peter M, MD;  Location: [place] CV LAB;  Service: Cardiovascular;  Laterality: N/A;  . MEATOTOMY N/A 10/04/2012   Procedure: MEATOTOMY ADULT;  Surgeon: Alexis Frock, MD;  Location: [facility];  Service: Urology;  Laterality: N/A;  . TRANSURETHRAL RESECTION OF BLADDER TUMOR WITH GYRUS (TURBT-GYRUS) N/A 10/04/2012   Procedure: TRANSURETHRAL RESECTION OF BLADDER TUMOR WITH GYRUS (TURBT-GYRUS);  Surgeon: Alexis Frock, MD;  Location[facility];  Service: Urology;  Laterality: N/A;  . VIDEO BRONCHOSCOPY Bilateral 07/19/2016   Procedure: VIDEO BRONCHOSCOPY WITH FLUORO;  Surgeon: Marshell Garfinkel, MD;  Location: [facility] ENDOSCOPY;  Service: Cardiopulmonary;  Laterality: Bilateral;    REVIEW OF SYSTEMS:  A comprehensive review of systems was negative except for: Respiratory: positive for pleurisy/chest pain   PHYSICAL EXAMINATION: General appearance: alert, cooperative and no distress Head: Normocephalic, without obvious abnormality, atraumatic Neck: no adenopathy, no JVD, supple, symmetrical, trachea midline and thyroid not enlarged, symmetric, no tenderness/mass/nodules Lymph nodes: Cervical, supraclavicular, and axillary nodes normal. Resp: clear to auscultation bilaterally Back: symmetric, no curvature. ROM normal. No CVA tenderness. Cardio: regular rate and rhythm, S1, S2 normal, no murmur, click, rub or gallop GI: soft, non-tender; bowel sounds normal; no masses,  no organomegaly Extremities: extremities normal, atraumatic, no cyanosis or edema  ECOG PERFORMANCE STATUS: 1 - Symptomatic but completely ambulatory  Blood pressure 115/69, pulse 82, temperature 97.7 F (36.5 C), temperature source Oral, resp. rate 18, height 5\' 10"  (1.778 m), weight 162 lb (73.5 kg), SpO2 98 %.  LABORATORY DATA: Lab Results  Component Value Date   WBC 7.7  05/16/2018   HGB 11.0 (L) 05/16/2018   HCT 35.6 (L) 05/16/2018   MCV 92.7 05/16/2018   PLT 367 05/16/2018      Chemistry      Component Value Date/Time   NA 137 04/07/2018 0339   NA 133 (L) 08/09/2016 1520   K 3.5 04/07/2018 0339   K 4.7 08/09/2016 1520   CL 103 04/07/2018 0339   CO2 26 04/07/2018 0339   CO2 27 08/09/2016 1520   BUN 9 04/07/2018 0339   BUN 3.9 (L) 08/09/2016 1520   CREATININE 0.92 04/07/2018 0339   CREATININE 0.86 04/21/2017 1437   CREATININE 0.8 08/09/2016 1520      Component Value Date/Time   CALCIUM 8.0 (L) 04/07/2018 0339   CALCIUM 9.6 08/09/2016 1520   ALKPHOS 71 04/07/2018 0339   ALKPHOS 105 08/09/2016 1520   AST 26 04/07/2018 0339   AST 85 (H) 08/09/2016 1520   ALT 17 04/07/2018 0339   ALT 45 08/09/2016 1520   BILITOT 0.7 04/07/2018 0339   BILITOT 1.04 08/09/2016 1520       RADIOGRAPHIC STUDIES: Dg Chest 2 View  Result Date: 04/17/2018 CLINICAL DATA:  73 y/o M; left-sided pleural effusion post thoracentesis 03/15/2018 and chest tube 04/07/2018. Left lung cancer. EXAM: CHEST - 2 VIEW COMPARISON:  04/08/2018  chest radiograph FINDINGS: Stable cardiac silhouette. Aortic atherosclerosis with calcification. Left upper lobe mass is stable. Mild increase in moderate left-sided pleural effusion with chest tube in situ. No pneumothorax. Emphysema within the left chest wall likely related chest tube placement. Recent left fourth and fifth posterolateral rib fractures are stable. IMPRESSION: Mild increase in moderate left-sided pleural effusion with chest tube in situ. No pneumothorax. Stable left upper lobe mass. Stable left fourth and fifth posterolateral rib fractures. Electronically Signed   By: Kristine Garbe M.D.   On: 04/17/2018 14:05    ASSESSMENT AND PLAN: This is a very pleasant 73 years old white male with a stage Ib non-small cell lung cancer, squamous cell carcinoma status post curative SBRT to the left lung mass under the care of Dr.  [name] in September 2018 and he has been on observation since that time. He developed recurrent left pleural effusion but the cytology has been negative for malignancy on several occasions.  The etiology of his recurrent pleural effusion is unclear for now but it could be secondary to inflammatory process or malignancy. I recommended for the patient to continue with the drainage of the left pleural effusion that he has a Pleurx catheter.  This may be removed by Dr. Servando Snare if the patient has no further drainage in the near future. I recommended for the patient to continue on observation and have repeat CT scan of the chest performed in 4 months for further evaluation of his disease. For pain management, he will ask his primary care physician for refill of his medication.  This was started by the hospitalist in the hospital before discharge. The patient was advised to call immediately if he has any other concerning symptoms in the interval. The patient voices understanding of current disease status and treatment options and is in agreement with the current care plan.  All questions were answered. The patient knows to call the clinic with any problems, questions or concerns. We can certainly see the patient much sooner if necessary.  I spent 10 minutes counseling the patient face to face. The total time spent in the appointment was 15 minutes.  Disclaimer: This note was dictated with voice recognition software. Similar sounding words can inadvertently be transcribed and may not be corrected upon review.

## 2018-05-17 ENCOUNTER — Ambulatory Visit: Payer: Medicare Other | Admitting: Internal Medicine

## 2018-05-17 ENCOUNTER — Telehealth: Payer: Self-pay | Admitting: Medical Oncology

## 2018-05-17 ENCOUNTER — Other Ambulatory Visit: Payer: Medicare Other

## 2018-05-17 NOTE — Telephone Encounter (Signed)
Asking to apeak to [facility] re appt yesterday -message sent.

## 2018-05-18 ENCOUNTER — Other Ambulatory Visit: Payer: Self-pay

## 2018-05-18 ENCOUNTER — Telehealth: Payer: Self-pay | Admitting: Internal Medicine

## 2018-05-18 DIAGNOSIS — J90 Pleural effusion, not elsewhere classified: Secondary | ICD-10-CM

## 2018-05-18 NOTE — Telephone Encounter (Signed)
Called regarding 8/7 and 8/11

## 2018-05-19 ENCOUNTER — Other Ambulatory Visit: Payer: Self-pay | Admitting: Cardiothoracic Surgery

## 2018-05-19 DIAGNOSIS — C3480 Malignant neoplasm of overlapping sites of unspecified bronchus and lung: Secondary | ICD-10-CM

## 2018-05-19 MED ORDER — OXYCODONE HCL 5 MG PO TABS: 5.0000 mg | ORAL_TABLET | Freq: Four times a day (QID) | ORAL | 0 refills | Status: DC | PRN

## 2018-05-22 ENCOUNTER — Other Ambulatory Visit: Payer: Self-pay | Admitting: *Deleted

## 2018-05-22 DIAGNOSIS — J90 Pleural effusion, not elsewhere classified: Secondary | ICD-10-CM

## 2018-05-24 ENCOUNTER — Ambulatory Visit (HOSPITAL_COMMUNITY)
Admission: RE | Admit: 2018-05-24 | Discharge: 2018-05-24 | Disposition: A | Discharge status: Home / Self Care | Payer: Medicare Other | Attending: Cardiothoracic Surgery | Admitting: Cardiothoracic Surgery

## 2018-05-24 ENCOUNTER — Encounter (HOSPITAL_COMMUNITY)
Admission: RE | Disposition: A | Discharge status: Home / Self Care | Payer: Self-pay | Source: Home / Self Care | Attending: Cardiothoracic Surgery

## 2018-05-24 ENCOUNTER — Encounter (HOSPITAL_COMMUNITY): Payer: Self-pay

## 2018-05-24 ENCOUNTER — Ambulatory Visit (HOSPITAL_COMMUNITY): Payer: Medicare Other

## 2018-05-24 DIAGNOSIS — Z4682 Encounter for fitting and adjustment of non-vascular catheter: Secondary | ICD-10-CM | POA: Insufficient documentation

## 2018-05-24 DIAGNOSIS — Z79899 Other long term (current) drug therapy: Secondary | ICD-10-CM | POA: Diagnosis not present

## 2018-05-24 DIAGNOSIS — J90 Pleural effusion, not elsewhere classified: Secondary | ICD-10-CM | POA: Insufficient documentation

## 2018-05-24 DIAGNOSIS — I10 Essential (primary) hypertension: Secondary | ICD-10-CM | POA: Diagnosis not present

## 2018-05-24 DIAGNOSIS — Z923 Personal history of irradiation: Secondary | ICD-10-CM | POA: Diagnosis not present

## 2018-05-24 DIAGNOSIS — K219 Gastro-esophageal reflux disease without esophagitis: Secondary | ICD-10-CM | POA: Insufficient documentation

## 2018-05-24 DIAGNOSIS — F419 Anxiety disorder, unspecified: Secondary | ICD-10-CM | POA: Diagnosis not present

## 2018-05-24 DIAGNOSIS — Z8551 Personal history of malignant neoplasm of bladder: Secondary | ICD-10-CM | POA: Diagnosis not present

## 2018-05-24 DIAGNOSIS — Z85118 Personal history of other malignant neoplasm of bronchus and lung: Secondary | ICD-10-CM | POA: Insufficient documentation

## 2018-05-24 DIAGNOSIS — Z01818 Encounter for other preprocedural examination: Secondary | ICD-10-CM

## 2018-05-24 DIAGNOSIS — C3412 Malignant neoplasm of upper lobe, left bronchus or lung: Secondary | ICD-10-CM | POA: Diagnosis not present

## 2018-05-24 HISTORY — PX: REMOVAL OF PLEURAL DRAINAGE CATHETER: SHX5080

## 2018-05-24 SURGERY — REMOVAL, CLOSED DRAINAGE CATHETER SYSTEM, PLEURAL
Anesthesia: Monitor Anesthesia Care | Laterality: Left

## 2018-05-24 NOTE — Procedures (Addendum)
      [address]       [place] [address]             [phone]     Brief history and physical:  Patient is a 73 year old male with a history of stage Ib non-small cell lung cancer, squamous cell carcinoma who we placed a Pleurx catheter in for recurrent pleural effusion.  The drainage has decreased significantly recently and it was felt it would be acceptable to remove the tube today.  He presented to a short stay area for the procedure.  A preoperative chest x-ray was also done which showed significant improvement in the pleural effusion.  This was reviewed by Dr. Servando Snare.  He is seen by Dr. Earlie Server from oncology.  He is currently on observational status.  He has had SBRT under the care of Dr. [name] completed October 28, 2016.  Exam: Well-developed adult male who appears stated age in no acute distress Pulmonary: Mildly diminished breath sounds in the left base otherwise clear Cardiac: Regular rate and rhythm normal S1-S2 Abdominal exam: Soft nontender Extremities: No edema  Assessment: Recurrent Pleural effusion s/p left pleurx   Plan: removal of left pleurx catheter  Brief procedure note:  Procedure: Removal of left Pleurx catheter Anesthesia: 10 cc 2% local lidocaine infiltrate Prep: Betadine Procedure: The left Pleurx catheter was performed removed following local anesthetic infiltration and Betadine prep.  It was removed intact per usual technique.  Patient tolerated the procedure well.  The wound was dressed with sterile 2 x 2's and Hypafix tape.  John Giovanni, PA-C

## 2018-05-25 ENCOUNTER — Encounter (HOSPITAL_COMMUNITY): Payer: Self-pay | Admitting: Cardiothoracic Surgery

## 2018-05-25 ENCOUNTER — Ambulatory Visit: Payer: Medicare Other | Admitting: Cardiothoracic Surgery

## 2018-05-31 DIAGNOSIS — Z87891 Personal history of nicotine dependence: Secondary | ICD-10-CM | POA: Diagnosis not present

## 2018-05-31 DIAGNOSIS — E440 Moderate protein-calorie malnutrition: Secondary | ICD-10-CM | POA: Diagnosis not present

## 2018-05-31 DIAGNOSIS — J9621 Acute and chronic respiratory failure with hypoxia: Secondary | ICD-10-CM | POA: Diagnosis not present

## 2018-05-31 DIAGNOSIS — Z48813 Encounter for surgical aftercare following surgery on the respiratory system: Secondary | ICD-10-CM | POA: Diagnosis not present

## 2018-05-31 DIAGNOSIS — F419 Anxiety disorder, unspecified: Secondary | ICD-10-CM | POA: Diagnosis not present

## 2018-05-31 DIAGNOSIS — C3492 Malignant neoplasm of unspecified part of left bronchus or lung: Secondary | ICD-10-CM | POA: Diagnosis not present

## 2018-05-31 DIAGNOSIS — G894 Chronic pain syndrome: Secondary | ICD-10-CM | POA: Diagnosis not present

## 2018-05-31 DIAGNOSIS — G8929 Other chronic pain: Secondary | ICD-10-CM | POA: Diagnosis not present

## 2018-05-31 DIAGNOSIS — Z9181 History of falling: Secondary | ICD-10-CM | POA: Diagnosis not present

## 2018-05-31 DIAGNOSIS — Z4801 Encounter for change or removal of surgical wound dressing: Secondary | ICD-10-CM | POA: Diagnosis not present

## 2018-05-31 DIAGNOSIS — Z955 Presence of coronary angioplasty implant and graft: Secondary | ICD-10-CM | POA: Diagnosis not present

## 2018-05-31 DIAGNOSIS — J90 Pleural effusion, not elsewhere classified: Secondary | ICD-10-CM | POA: Diagnosis not present

## 2018-05-31 DIAGNOSIS — K269 Duodenal ulcer, unspecified as acute or chronic, without hemorrhage or perforation: Secondary | ICD-10-CM | POA: Diagnosis not present

## 2018-06-01 DIAGNOSIS — M545 Low back pain: Secondary | ICD-10-CM | POA: Diagnosis not present

## 2018-06-01 DIAGNOSIS — K269 Duodenal ulcer, unspecified as acute or chronic, without hemorrhage or perforation: Secondary | ICD-10-CM | POA: Diagnosis not present

## 2018-06-01 DIAGNOSIS — C3492 Malignant neoplasm of unspecified part of left bronchus or lung: Secondary | ICD-10-CM | POA: Diagnosis not present

## 2018-06-01 DIAGNOSIS — G8929 Other chronic pain: Secondary | ICD-10-CM | POA: Diagnosis not present

## 2018-06-05 DIAGNOSIS — Z79899 Other long term (current) drug therapy: Secondary | ICD-10-CM | POA: Diagnosis not present

## 2018-06-05 DIAGNOSIS — R079 Chest pain, unspecified: Secondary | ICD-10-CM | POA: Diagnosis not present

## 2018-06-05 DIAGNOSIS — C3490 Malignant neoplasm of unspecified part of unspecified bronchus or lung: Secondary | ICD-10-CM | POA: Diagnosis not present

## 2018-06-05 DIAGNOSIS — F419 Anxiety disorder, unspecified: Secondary | ICD-10-CM | POA: Diagnosis not present

## 2018-06-08 DIAGNOSIS — R079 Chest pain, unspecified: Secondary | ICD-10-CM | POA: Diagnosis not present

## 2018-06-08 DIAGNOSIS — C3490 Malignant neoplasm of unspecified part of unspecified bronchus or lung: Secondary | ICD-10-CM | POA: Diagnosis not present

## 2018-06-08 DIAGNOSIS — M545 Low back pain: Secondary | ICD-10-CM | POA: Diagnosis not present

## 2018-06-08 DIAGNOSIS — Z79899 Other long term (current) drug therapy: Secondary | ICD-10-CM | POA: Diagnosis not present

## 2018-06-09 ENCOUNTER — Telehealth: Payer: Self-pay | Admitting: *Deleted

## 2018-06-09 NOTE — Telephone Encounter (Signed)
Received vm message from patient requesting call back regarding his appts in August. TCT patient. Spoke with him. He states he feels that waiting until August for repeat scans is too long. His PCP apparently did a CXR and told him 'it didn't look good' but had nothing to compare it to.  Pt had CXR on 05/24/18 after pleuryx tube removed per Dr. Servando Snare. CXR stable at that time. Reminded patient of that.  He still insists he needs his scans sooner and  he 'is overdue for a PET Scan". He does not want to wait until August.  Please advise.

## 2018-06-12 NOTE — Telephone Encounter (Signed)
We can do the scan and see him in Late June or early July.

## 2018-06-12 NOTE — Telephone Encounter (Signed)
Pt.notified

## 2018-06-13 ENCOUNTER — Telehealth: Payer: Self-pay | Admitting: Medical Oncology

## 2018-06-13 ENCOUNTER — Telehealth: Payer: Self-pay | Admitting: Internal Medicine

## 2018-06-13 NOTE — Telephone Encounter (Signed)
Scheduled lab and f/u for CT per sch msg. Mailed printout.

## 2018-06-13 NOTE — Telephone Encounter (Signed)
Pt is okay with scan expected date of 6/15.

## 2018-06-26 ENCOUNTER — Telehealth: Payer: Self-pay | Admitting: Medical Oncology

## 2018-06-26 NOTE — Telephone Encounter (Signed)
No PET scan. Just CT chest. Will do a PET scan if the CT is concerning. PET scan is not routine to be done every few months or years, Only if the CT is concerning.

## 2018-06-26 NOTE — Telephone Encounter (Addendum)
  Pt notified. He said " I am dying of cancer and nobody seems to care"- Pt threatening legal action. I told him his pleurix tube was removed and CXR after was stable. He wants his CT scan moved up to this month.Expected Date changed on ct scan order Message sent to managed care and Central scheduling.

## 2018-06-26 NOTE — Telephone Encounter (Signed)
CT  expected June 15th Pt said at last visit he was told he is overdue for PET scan.

## 2018-06-27 ENCOUNTER — Telehealth: Payer: Self-pay | Admitting: Medical Oncology

## 2018-06-27 NOTE — Telephone Encounter (Signed)
Pt aware of new appts.

## 2018-06-29 DIAGNOSIS — C3492 Malignant neoplasm of unspecified part of left bronchus or lung: Secondary | ICD-10-CM | POA: Diagnosis not present

## 2018-06-29 DIAGNOSIS — Z4801 Encounter for change or removal of surgical wound dressing: Secondary | ICD-10-CM | POA: Diagnosis not present

## 2018-06-29 DIAGNOSIS — J90 Pleural effusion, not elsewhere classified: Secondary | ICD-10-CM | POA: Diagnosis not present

## 2018-06-29 DIAGNOSIS — Z48813 Encounter for surgical aftercare following surgery on the respiratory system: Secondary | ICD-10-CM | POA: Diagnosis not present

## 2018-06-30 ENCOUNTER — Telehealth: Payer: Self-pay | Admitting: Internal Medicine

## 2018-06-30 ENCOUNTER — Telehealth: Payer: Self-pay | Admitting: Medical Oncology

## 2018-06-30 NOTE — Telephone Encounter (Signed)
Confirmed  June appts.

## 2018-06-30 NOTE — Telephone Encounter (Signed)
Scheduled appt per sch msg.called and left msg for patient

## 2018-07-03 DIAGNOSIS — C3490 Malignant neoplasm of unspecified part of unspecified bronchus or lung: Secondary | ICD-10-CM | POA: Diagnosis not present

## 2018-07-03 DIAGNOSIS — F419 Anxiety disorder, unspecified: Secondary | ICD-10-CM | POA: Diagnosis not present

## 2018-07-03 DIAGNOSIS — Z79899 Other long term (current) drug therapy: Secondary | ICD-10-CM | POA: Diagnosis not present

## 2018-07-03 DIAGNOSIS — M545 Low back pain: Secondary | ICD-10-CM | POA: Diagnosis not present

## 2018-07-10 ENCOUNTER — Inpatient Hospital Stay: Payer: Medicare Other | Attending: Internal Medicine

## 2018-07-10 ENCOUNTER — Ambulatory Visit (HOSPITAL_COMMUNITY)
Admission: RE | Admit: 2018-07-10 | Discharge: 2018-07-10 | Disposition: A | Discharge status: Home / Self Care | Payer: Medicare Other | Source: Ambulatory Visit | Attending: Internal Medicine | Admitting: Internal Medicine

## 2018-07-10 ENCOUNTER — Other Ambulatory Visit: Payer: Self-pay

## 2018-07-10 ENCOUNTER — Encounter (HOSPITAL_COMMUNITY): Payer: Self-pay

## 2018-07-10 DIAGNOSIS — C3412 Malignant neoplasm of upper lobe, left bronchus or lung: Secondary | ICD-10-CM | POA: Insufficient documentation

## 2018-07-10 DIAGNOSIS — C3490 Malignant neoplasm of unspecified part of unspecified bronchus or lung: Secondary | ICD-10-CM | POA: Diagnosis not present

## 2018-07-10 DIAGNOSIS — R918 Other nonspecific abnormal finding of lung field: Secondary | ICD-10-CM | POA: Diagnosis not present

## 2018-07-10 DIAGNOSIS — J439 Emphysema, unspecified: Secondary | ICD-10-CM | POA: Diagnosis not present

## 2018-07-10 DIAGNOSIS — Z85118 Personal history of other malignant neoplasm of bronchus and lung: Secondary | ICD-10-CM | POA: Diagnosis not present

## 2018-07-10 LAB — CMP (CANCER CENTER ONLY)
ALT: 13 U/L (ref 0–44)
AST: 33 U/L (ref 15–41)
Albumin: 3.1 g/dL — ABNORMAL LOW (ref 3.5–5.0)
Alkaline Phosphatase: 125 U/L (ref 38–126)
Anion gap: 8 (ref 5–15)
BUN: 9 mg/dL (ref 8–23)
CO2: 28 mmol/L (ref 22–32)
Calcium: 8.9 mg/dL (ref 8.9–10.3)
Chloride: 99 mmol/L (ref 98–111)
Creatinine: 0.79 mg/dL (ref 0.61–1.24)
GFR, Est AFR Am: >60 mL/min (ref >60)
GFR, Estimated: >60 mL/min (ref >60)
Glucose, Bld: 107 mg/dL — ABNORMAL HIGH (ref 70–99)
Potassium: 5.1 mmol/L (ref 3.5–5.1)
Sodium: 135 mmol/L (ref 135–145)
Total Bilirubin: 0.7 mg/dL (ref 0.3–1.2)
Total Protein: 7.8 g/dL (ref 6.5–8.1)

## 2018-07-10 LAB — CBC WITH DIFFERENTIAL (CANCER CENTER ONLY)
Abs Immature Granulocytes: 0.02 10*3/uL (ref 0.00–0.07)
Basophils Absolute: 0 10*3/uL (ref 0.0–0.1)
Basophils Relative: 1 %
Eosinophils Absolute: 0.2 10*3/uL (ref 0.0–0.5)
Eosinophils Relative: 5 %
HCT: 38.2 % — ABNORMAL LOW (ref 39.0–52.0)
Hemoglobin: 11.3 g/dL — ABNORMAL LOW (ref 13.0–17.0)
Immature Granulocytes: 0 %
Lymphocytes Relative: 11 %
Lymphs Abs: 0.5 10*3/uL — ABNORMAL LOW (ref 0.7–4.0)
MCH: 24.9 pg — ABNORMAL LOW (ref 26.0–34.0)
MCHC: 29.6 g/dL — ABNORMAL LOW (ref 30.0–36.0)
MCV: 84.1 fL (ref 80.0–100.0)
Monocytes Absolute: 0.7 10*3/uL (ref 0.1–1.0)
Monocytes Relative: 15 %
Neutro Abs: 3.2 10*3/uL (ref 1.7–7.7)
Neutrophils Relative %: 68 %
Platelet Count: 270 10*3/uL (ref 150–400)
RBC: 4.54 MIL/uL (ref 4.22–5.81)
RDW: 19.4 % — ABNORMAL HIGH (ref 11.5–15.5)
WBC Count: 4.6 10*3/uL (ref 4.0–10.5)
nRBC: 0 % (ref 0.0–0.2)

## 2018-07-10 MED ORDER — IOHEXOL 300 MG/ML  SOLN
75.0000 mL | Freq: Once | INTRAMUSCULAR | Status: AC | PRN
  Administered 2018-07-10: 75 mL via INTRAVENOUS

## 2018-07-10 MED ORDER — SODIUM CHLORIDE (PF) 0.9 % IJ SOLN
INTRAMUSCULAR | Status: AC
  Filled 2018-07-10: qty 50

## 2018-07-13 ENCOUNTER — Telehealth: Payer: Self-pay | Admitting: Medical Oncology

## 2018-07-13 NOTE — Telephone Encounter (Signed)
I returned pt's call. Can he do a phone visit on June 8th? transportation problems and difficulty getting around. Message to [place].

## 2018-07-13 NOTE — Telephone Encounter (Addendum)
Pt notified. Phone number noted under appt.

## 2018-07-13 NOTE — Telephone Encounter (Signed)
Ok

## 2018-07-14 NOTE — Telephone Encounter (Signed)
Confirmed with patient.

## 2018-07-17 ENCOUNTER — Inpatient Hospital Stay (HOSPITAL_BASED_OUTPATIENT_CLINIC_OR_DEPARTMENT_OTHER): Payer: Medicare Other | Admitting: Internal Medicine

## 2018-07-17 ENCOUNTER — Encounter: Payer: Self-pay | Admitting: Internal Medicine

## 2018-07-17 ENCOUNTER — Other Ambulatory Visit: Payer: Self-pay

## 2018-07-17 DIAGNOSIS — C3412 Malignant neoplasm of upper lobe, left bronchus or lung: Secondary | ICD-10-CM | POA: Diagnosis not present

## 2018-07-17 DIAGNOSIS — I10 Essential (primary) hypertension: Secondary | ICD-10-CM

## 2018-07-17 DIAGNOSIS — Z79899 Other long term (current) drug therapy: Secondary | ICD-10-CM | POA: Diagnosis not present

## 2018-07-17 DIAGNOSIS — C7802 Secondary malignant neoplasm of left lung: Secondary | ICD-10-CM

## 2018-07-17 NOTE — Progress Notes (Signed)
[place] Telephone:(336) [phone]   Fax:(336) [phone]  PROGRESS NOTE FOR TELEMEDICINE VISITS  [name], MD [address] [place] [address]  I connected with@ on 07/17/18 at  9:45 AM EDT by telephone visit and verified that I am speaking with the correct person using two identifiers.   I discussed the limitations, risks, security and privacy concerns of performing an evaluation and management service by telemedicine and the availability of in-person appointments. I also discussed with the patient that there may be a patient responsible charge related to this service. The patient expressed understanding and agreed to proceed.  Other persons participating in the visit and their role in the encounter:  None.  Patient's location:  Home Provider's location: [place].  DIAGNOSIS: Stage IB (T2a, N0, M0) non-small cell lung cancer, squamous cell carcinoma presented with cavitaryleft upper lobe lung massdiagnosed in June 2018.  PRIOR THERAPY: Curative SBRT to the left lung mass under the care of Dr. [name] completed October 28, 2016.  CURRENT THERAPY: Observation.  INTERVAL HISTORY: Tyler Mcmillan 73 y.o. male has a telephone virtual visit with me today for evaluation and discussion of his scan results.  The patient is feeling fine today except for the pain on the left side of the chest.  He also has shortness of breath with exertion.  He denied having any recent weight loss or night sweats.  He has no nausea, vomiting, diarrhea or constipation.  He denied having any fever or chills.  He had repeat CT scan of the chest performed recently and is here for evaluation and discussion of his scan results.  MEDICAL HISTORY: Past Medical History:  Diagnosis Date   AAA (abdominal aortic aneurysm) without rupture ([place])    [place] ABD.  DONE  JUNE 2014  3.5   Bladder cancer [facility])    Bladder neoplasm    Chronic anxiety    GERD  (gastroesophageal reflux disease)    Hypertension    Stage I squamous cell carcinoma of left lung ([place]) 08/09/2016    ALLERGIES:  has No Known Allergies.  MEDICATIONS:  Current Outpatient Medications  Medication Sig Dispense Refill   atenolol (TENORMIN) 50 MG tablet Take 50 mg by mouth daily.     clonazePAM (KLONOPIN) 0.5 MG tablet Take 1 tablet (0.5 mg total) by mouth 2 (two) times daily. 10 tablet 0   Ensure Plus (ENSURE PLUS) LIQD Take 1 Can by mouth 2 (two) times daily between meals.     oxyCODONE (OXY IR/ROXICODONE) 5 MG immediate release tablet Take 1 tablet (5 mg total) by mouth every 6 (six) hours as needed for severe pain. 25 tablet 0   pantoprazole (PROTONIX) 40 MG tablet Take 1 tablet (40 mg total) by mouth 2 (two) times daily. 60 tablet 3   No current facility-administered medications for this visit.     SURGICAL HISTORY:  Past Surgical History:  Procedure Laterality Date   ABDOMINAL AORTIC ENDOVASCULAR STENT GRAFT N/A 09/08/2016   Procedure: ABDOMINAL AORTIC ENDOVASCULAR STENT GRAFT;  Surgeon: [name], MD;  Location: [place];  Service: Vascular;  Laterality: N/A;   BIOPSY  03/31/2018   Procedure: BIOPSY;  Surgeon: Wonda Horner, MD;  Location: [facility] ENDOSCOPY;  Service: Endoscopy;;   CHEST TUBE INSERTION Left 04/07/2018   Procedure: INSERTION PLEURAL DRAINAGE CATHETER USING ULTRASOUND AND FLURO GUIDANCE;  Surgeon: Grace Isaac, MD;  Location: [place];  Service: Thoracic;  Laterality: Left;   CYSTOSCOPY W/ URETERAL  STENT PLACEMENT Left 10/04/2012   Procedure: CYSTOSCOPY WITH RETROGRADE PYELOGRAM/URETERAL STENT PLACEMENT   "POSSIBLE LEFT STENT";  Surgeon: Alexis Frock, MD;  Location: [facility];  Service: Urology;  Laterality: Left;   ESOPHAGOGASTRODUODENOSCOPY (EGD) WITH PROPOFOL N/A 03/31/2018   Procedure: ESOPHAGOGASTRODUODENOSCOPY (EGD) WITH PROPOFOL;  Surgeon: Wonda Horner, MD;  Location: [facility] ENDOSCOPY;  Service: Endoscopy;  Laterality: N/A;    IR THORACENTESIS ASP PLEURAL SPACE W/IMG GUIDE  03/31/2018   IR THORACENTESIS ASP PLEURAL SPACE W/IMG GUIDE  04/03/2018   IR THORACENTESIS ASP PLEURAL SPACE W/IMG GUIDE  04/04/2018   LEFT HEART CATH AND CORONARY ANGIOGRAPHY N/A 07/02/2016   Procedure: Left Heart Cath and Coronary Angiography;  Surgeon: Martinique, Peter M, MD;  Location: [place] CV LAB;  Service: Cardiovascular;  Laterality: N/A;   MEATOTOMY N/A 10/04/2012   Procedure: MEATOTOMY ADULT;  Surgeon: Alexis Frock, MD;  Location: [facility];  Service: Urology;  Laterality: N/A;   REMOVAL OF PLEURAL DRAINAGE CATHETER Left 05/24/2018   Procedure: REMOVAL OF PLEURAL DRAINAGE CATHETER;  Surgeon: Grace Isaac, MD;  Location: [place];  Service: Thoracic;  Laterality: Left;   TRANSURETHRAL RESECTION OF BLADDER TUMOR WITH GYRUS (TURBT-GYRUS) N/A 10/04/2012   Procedure: TRANSURETHRAL RESECTION OF BLADDER TUMOR WITH GYRUS (TURBT-GYRUS);  Surgeon: Alexis Frock, MD;  Location[facility];  Service: Urology;  Laterality: N/A;   VIDEO BRONCHOSCOPY Bilateral 07/19/2016   Procedure: VIDEO BRONCHOSCOPY WITH FLUORO;  Surgeon: Marshell Garfinkel, MD;  Location: [facility] ENDOSCOPY;  Service: Cardiopulmonary;  Laterality: Bilateral;    REVIEW OF SYSTEMS:  A comprehensive review of systems was negative except for: Respiratory: positive for dyspnea on exertion and pleurisy/chest pain    LABORATORY DATA: Lab Results  Component Value Date   WBC 4.6 07/10/2018   HGB 11.3 (L) 07/10/2018   HCT 38.2 (L) 07/10/2018   MCV 84.1 07/10/2018   PLT 270 07/10/2018      Chemistry      Component Value Date/Time   NA 135 07/10/2018 0931   NA 133 (L) 08/09/2016 1520   K 5.1 07/10/2018 0931   K 4.7 08/09/2016 1520   CL 99 07/10/2018 0931   CO2 28 07/10/2018 0931   CO2 27 08/09/2016 1520   BUN 9 07/10/2018 0931   BUN 3.9 (L) 08/09/2016 1520   CREATININE 0.79 07/10/2018 0931   CREATININE 0.8 08/09/2016 1520      Component  Value Date/Time   CALCIUM 8.9 07/10/2018 0931   CALCIUM 9.6 08/09/2016 1520   ALKPHOS 125 07/10/2018 0931   ALKPHOS 105 08/09/2016 1520   AST 33 07/10/2018 0931   AST 85 (H) 08/09/2016 1520   ALT 13 07/10/2018 0931   ALT 45 08/09/2016 1520   BILITOT 0.7 07/10/2018 0931   BILITOT 1.04 08/09/2016 1520       RADIOGRAPHIC STUDIES: Ct Chest W Contrast  Result Date: 07/10/2018 CLINICAL DATA:  Non-small cell lung cancer, status post SBRT EXAM: CT CHEST WITH CONTRAST TECHNIQUE: Multidetector CT imaging of the chest was performed during intravenous contrast administration. CONTRAST:  19mL OMNIPAQUE IOHEXOL 300 MG/ML  SOLN COMPARISON:  CTA chest dated 03/30/2018.  CT chest dated 04/21/2017. FINDINGS: Cardiovascular: Heart is normal in size.  No pericardial effusion. No evidence of thoracic aortic aneurysm. Atherosclerotic calcifications of the aortic arch. Three vessel coronary atherosclerosis. Mediastinum/Nodes: 10 mm short axis prevascular node (series 2/image 37), unchanged. Otherwise, no suspicious mediastinal lymphadenopathy. Visualized thyroid is unremarkable. Lungs/Pleura: 2.6 x 3.5 cm cavitary central left upper  lobe mass (series 7/image 38), previously 3.2 x 3.4 cm when measured in a similar fashion on the most recent prior, previously 3.3 x 3.6 cm in 2019. Adjacent focal radiation changes posteriorly related to SBRT (series 7/image 40). Associated volume loss and subpleural scarring. Small left pleural effusion, partially loculated, significantly improved the most recent prior. No new/suspicious pulmonary nodules. Mild centrilobular and paraseptal emphysematous changes. No pneumothorax. Upper Abdomen: Visualized upper abdomen is notable for an incompletely visualized abdominal aortic aneurysm status post aortobiiliac stent, tiny layering gallstones, and vascular calcifications. Musculoskeletal: Moderate compression fracture deformity at L1, unchanged. Mild anterior wedging of multiple midthoracic  vertebral bodies. IMPRESSION: 3.5 cm cavitary central left upper lobe mass, corresponding to the patient's known primary bronchogenic neoplasm, status post SBRT. 10 mm short axis prevascular node, unchanged. No evidence of new/progressive metastatic disease. Additional stable ancillary findings as above. Aortic Atherosclerosis (ICD10-I70.0) and Emphysema (ICD10-J43.9). Electronically Signed   By: Julian Hy M.D.   On: 07/10/2018 11:48    ASSESSMENT AND PLAN: This is a very pleasant 73 years old white male with stage Ib non-small cell lung cancer, squamous cell carcinoma status post curative SBRT to the left lung mass under the care of Dr. [name] in September 2018 and he has been on observation since that time. The patient continues to complain of pain on the left side of the chest as well as shortness of breath at baseline increased with exertion.  He has no more drainage of the left pleural effusion. He had repeat CT scan of the chest performed recently.  I personally and independently reviewed the scans with the patient.  His a scan showed no concerning findings for disease progression. The patient is not satisfied with having the left upper lobe lesion in place.  He would like it to be taken out. I recommended for him to see Dr. Servando Snare for discussion of the surgical option. I will see him back for follow-up visit in 3 months for evaluation with repeat CT scan of the chest to rule out any disease progression. If the patient has any concerning findings for disease progression or metastatic lesion, may consider him for a PET scan for further evaluation. He was advised to call immediately if he has any other concerning symptoms in the interval. I discussed the assessment and treatment plan with the patient. The patient was provided an opportunity to ask questions and all were answered. The patient agreed with the plan and demonstrated an understanding of the instructions.   The patient was  advised to call back or seek an in-person evaluation if the symptoms worsen or if the condition fails to improve as anticipated.  I provided 11 minutes of non face-to-face telephone visit time during this encounter, and > 50% was spent counseling as documented under my assessment & plan.  Eilleen Kempf, MD 07/17/2018 9:54 AM  Disclaimer: This note was dictated with voice recognition software. Similar sounding words can inadvertently be transcribed and may not be corrected upon review.

## 2018-07-18 ENCOUNTER — Telehealth: Payer: Self-pay | Admitting: Internal Medicine

## 2018-07-18 NOTE — Telephone Encounter (Signed)
Scheduled appt per 6/08 los - mailed letter with appt date and time for 3 month appt . Central radiology to contact pt with scan apt.

## 2018-07-24 ENCOUNTER — Other Ambulatory Visit: Payer: Medicare Other

## 2018-07-26 ENCOUNTER — Ambulatory Visit: Payer: Medicare Other | Admitting: Internal Medicine

## 2018-07-27 ENCOUNTER — Telehealth (INDEPENDENT_AMBULATORY_CARE_PROVIDER_SITE_OTHER): Payer: Medicare Other | Admitting: Cardiothoracic Surgery

## 2018-07-27 ENCOUNTER — Other Ambulatory Visit: Payer: Self-pay | Admitting: *Deleted

## 2018-07-27 ENCOUNTER — Other Ambulatory Visit: Payer: Self-pay

## 2018-07-27 DIAGNOSIS — M545 Low back pain: Secondary | ICD-10-CM | POA: Diagnosis not present

## 2018-07-27 DIAGNOSIS — C3480 Malignant neoplasm of overlapping sites of unspecified bronchus and lung: Secondary | ICD-10-CM | POA: Diagnosis not present

## 2018-07-27 DIAGNOSIS — C3490 Malignant neoplasm of unspecified part of unspecified bronchus or lung: Secondary | ICD-10-CM

## 2018-07-27 DIAGNOSIS — M5136 Other intervertebral disc degeneration, lumbar region: Secondary | ICD-10-CM | POA: Diagnosis not present

## 2018-07-27 DIAGNOSIS — Z79899 Other long term (current) drug therapy: Secondary | ICD-10-CM | POA: Diagnosis not present

## 2018-07-27 DIAGNOSIS — Z1159 Encounter for screening for other viral diseases: Secondary | ICD-10-CM | POA: Diagnosis not present

## 2018-07-27 NOTE — Progress Notes (Signed)
[address]       Tyler Mcmillan,Tyler Mcmillan             [phone]      Telehealth Visit     Virtual Visit via Telephone Note   This visit type was conducted due to national recommendations for restrictions regarding the COVID-19 Pandemic (e.g. social distancing) in an effort to limit this patient's exposure and mitigate transmission in our community.  Due to his co-morbid illnesses, this patient is at least at moderate risk for complications without adequate follow up.  This format is felt to be most appropriate for this patient at this time.  The patient did not have access to video technology/had technical difficulties with video requiring transitioning to audio format only (telephone).  All issues noted in this document were discussed and addressed.  No physical exam could be performed with this format.  This visit type was conducted due to national recommendations for restrictions regarding the COVID-19 Pandemic (e.g. social distancing).  This format is felt to be most appropriate for this patient at this time.  All issues noted in this document were discussed and addressed.  No physical exam was performed (except for noted visual exam findings with Video Visits).      I contacted Tyler Mcmillan remotely due to the limitations of the current COVID pandemic on 07/27/2018  at  1:13 PM  verifying that I was speaking to Tyler Mcmillan whose birthday is 11/02/1945.   I discussed limitations of the evaluation and management  of patients remotely without  the benefit of physical exam.  The patient was agreeable with proceeding with a remote/ not face to face visit.                 [place] Medical Record #[id number] Date of Birth: 1945-11-03  Referring: Curt Bears, MD Primary Care: [name], MD  Chief Complaint:   Lung cancer  History of Present Illness:    Tyler Mcmillan 73 y.o. male was seen in the office in 2018 for  For evaluation of abdominal aortic aneurysm and left lung mass. On Memorial Day 2018 weekend the patient noted increasing shortness of breath and chest discomfort ultimately he was admitted to the hospital, had CT scan of the chest and cardiac catheterization. His catheterization was without significant disease and he was discharged home.  Prior to treatment a PET scan was performed on June 11 he underwent bronchoscopy by the pulmonary service.   With biopsy of the left upper lobe:  Diagnosis Lung, transbronchial biopsy - NON-SMALL CELL CARCINOMA, SEE COMMENT. Microscopic Comment There is a fragment with malignant cells and desmoplastic stroma. The morphology favors a squamous cell carcinoma. Other fragments show inflammation including giant cells and fragments of necrosis. AFB, GMS, and PAS are negative for organisms. Dr. Lyndon Code has reviewed the case. Dr. Vaughan Browner was paged on 07/20/2016. There is likely insufficient tissue for ancillary studies. [name] MD  The patient was also noted to have a large abdominal aortic aneurysm, which was repaired with a stent graft August 1.   In August 2018 he returned and after further evaluation and review at the multidisciplinary thoracic oncology conference the patient wished to proceed with stereotactic radiotherapy.  He is been followed since that time in the radiation oncology clinic.   I saw him in early 2020 when he presented with a large right pleural effusion.  Following his hospitalization he was weak  enough that he was ultimately discharged to a nursing home rehab center for a while before returning home this was drained a Pleurx catheter was left in and ultimately removed.  The patient now returns to the oncology clinic wanting a pulmonary resection.  We reached him by telephone today virtually because he was unable to make it to the office.  Notes that he is mostly limited in his ability to get around and uses a cane   Current Activity/  Functional Status:  Patient is independent with mobility/ambulation, transfers, ADL's, IADL's.     Zubrod Score: At the time of surgery this patients most appropriate activity status/level should be described as: []     0    Normal activity, no symptoms [x]     1    Restricted in physical strenuous activity but ambulatory, able to do out light work []     2    Ambulatory and capable of self care, unable to do work activities, up and about               >50 % of waking hours                              []     3    Only limited self care, in bed greater than 50% of waking hours []     4    Completely disabled, no self care, confined to bed or chair []     5    Moribund   Past Medical History:  Diagnosis Date   AAA (abdominal aortic aneurysm) without rupture ([place])    [place] ABD.  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 ([place]) 08/09/2016     Past Surgical History:  Procedure Laterality Date   ABDOMINAL AORTIC ENDOVASCULAR STENT GRAFT N/A 09/08/2016   Procedure: ABDOMINAL AORTIC ENDOVASCULAR STENT GRAFT;  Surgeon: [name], MD;  Location: [place];  Service: Vascular;  Laterality: N/A;   BIOPSY  03/31/2018   Procedure: BIOPSY;  Surgeon: Wonda Horner, MD;  Location: [facility] ENDOSCOPY;  Service: Endoscopy;;   CHEST TUBE INSERTION Left 04/07/2018   Procedure: INSERTION PLEURAL DRAINAGE CATHETER USING ULTRASOUND AND FLURO GUIDANCE;  Surgeon: Grace Isaac, MD;  Location: [place];  Service: Thoracic;  Laterality: Left;   CYSTOSCOPY W/ URETERAL STENT PLACEMENT Left 10/04/2012   Procedure: CYSTOSCOPY WITH RETROGRADE PYELOGRAM/URETERAL STENT PLACEMENT   "POSSIBLE LEFT STENT";  Surgeon: Alexis Frock, MD;  Location: [facility];  Service: Urology;  Laterality: Left;   ESOPHAGOGASTRODUODENOSCOPY (EGD) WITH PROPOFOL N/A 03/31/2018    Procedure: ESOPHAGOGASTRODUODENOSCOPY (EGD) WITH PROPOFOL;  Surgeon: Wonda Horner, MD;  Location[facility]COPY;  Service: Endoscopy;  Laterality: N/A;   IR THORACENTESIS ASP PLEURAL SPACE W/IMG GUIDE  03/31/2018   IR THORACENTESIS ASP PLEURAL SPACE W/IMG GUIDE  04/03/2018   IR THORACENTESIS ASP PLEURAL SPACE W/IMG GUIDE  04/04/2018   LEFT HEART CATH AND CORONARY ANGIOGRAPHY N/A 07/02/2016   Procedure: Left Heart Cath and Coronary Angiography;  Surgeon: Martinique, Peter M, MD;  Location: [place] CV LAB;  Service: Cardiovascular;  Laterality: N/A;   MEATOTOMY N/A 10/04/2012   Procedure: MEATOTOMY ADULT;  Surgeon: Alexis Frock, MD;  Location: [facility];  Service: Urology;  Laterality: N/A;   REMOVAL OF PLEURAL DRAINAGE CATHETER Left 05/24/2018   Procedure: REMOVAL OF PLEURAL DRAINAGE CATHETER;  Surgeon: Grace Isaac, MD;  Location: [place];  Service: Thoracic;  Laterality: Left;   TRANSURETHRAL RESECTION OF BLADDER TUMOR WITH GYRUS (TURBT-GYRUS) N/A 10/04/2012   Procedure: TRANSURETHRAL RESECTION OF BLADDER TUMOR WITH GYRUS (TURBT-GYRUS);  Surgeon: Alexis Frock, MD;  Location: [facility];  Service: Urology;  Laterality: N/A;   VIDEO BRONCHOSCOPY Bilateral 07/19/2016   Procedure: VIDEO BRONCHOSCOPY WITH FLUORO;  Surgeon: Marshell Garfinkel, MD;  Location: [facility] ENDOSCOPY;  Service: Cardiopulmonary;  Laterality: Bilateral;    Family History  Problem Relation Age of Onset   Cancer Mother    Heart disease Father        before age 66   Heart attack Father    Cancer Brother     Social History   Socioeconomic History   Marital status: Married    Spouse name: Not on file   Number of children: Not on file   Years of education: Not on file   Highest education level: Not on file  Occupational History   Not on file  Social Needs   Financial resource strain: Not on file   Food insecurity    Worry: Not on file    Inability: Not on file    Transportation needs    Medical: Not on file    Non-medical: Not on file  Tobacco Use   Smoking status: Current Every Day Smoker    Packs/day: 0.50    Years: 50.00    Pack years: 25.00    Types: Cigarettes   Smokeless tobacco: Never Used   Tobacco comment: pt refuses info  Substance and Sexual Activity   Alcohol use: Yes    Alcohol/week: 24.0 standard drinks    Types: 10 Glasses of wine, 14 Cans of beer per week    Comment: few glases wine per day, beer   Drug use: No   Sexual activity: Not on file  Lifestyle   Physical activity    Days per week: Not on file    Minutes per session: Not on file   Stress: Not on file  Relationships   Social connections    Talks on phone: Not on file    Gets together: Not on file    Attends religious service: Not on file    Active member of club or organization: Not on file    Attends meetings of clubs or organizations: Not on file    Relationship status: Not on file   Intimate partner violence    Fear of current or ex partner: Not on file    Emotionally abused: Not on file    Physically abused: Not on file    Forced sexual activity: Not on file  Other Topics Concern   Not on file  Social History Narrative   Not on file    Social History   Tobacco Use  Smoking Status Current Every Day Smoker   Packs/day: 0.50   Years: 50.00   Pack years: 25.00   Types: Cigarettes  Smokeless Tobacco Never Used  Tobacco Comment   pt refuses info    Social History   Substance and Sexual Activity  Alcohol Use Yes   Alcohol/week: 24.0 standard drinks   Types: 10 Glasses of wine, 14 Cans of beer per week   Comment: few glases wine per day, beer     No Known Allergies  Current Outpatient Medications  Medication Sig Dispense Refill   atenolol (TENORMIN) 50 MG tablet Take 50 mg by mouth daily.     clonazePAM (KLONOPIN) 0.5 MG tablet Take 1 tablet (0.5 mg total) by mouth 2 (two) times daily. 10 tablet 0   Ensure Plus  (ENSURE PLUS) LIQD Take 1 Can by mouth 2 (two) times daily between meals.     oxyCODONE (OXY IR/ROXICODONE) 5 MG immediate release tablet Take 1 tablet (5 mg total) by mouth every 6 (six) hours as needed for severe pain. 25 tablet 0   pantoprazole (PROTONIX) 40 MG tablet Take 1 tablet (40 mg total) by mouth 2 (two) times daily. 60 tablet 3   No current facility-administered medications for this visit.     Pertinent items are noted in HPI.   Review of Systems:     Cardiac Review of Systems: Y or N  Chest Pain [ y   ]  Resting SOB Blue.Reese   ] Exertional SOB  Blue.Reese  ]  Orthopnea [ y ]   Pedal Edema [ n  ]    Palpitations [ n ] Syncope  [ n ]   Presyncope [  n ]  General Review of Systems: [Y] = yes [  ]=no Constitional: recent weight change [ n ];  Wt loss over the last 3 months [   ] anorexia [  ]; fatigue [  ]; nausea [  ]; night sweats [  ]; fever [  ]; or chills [  ];          Dental: poor dentition[  ]; Last Dentist visit:   Eye : blurred vision [  ]; diplopia [   ]; vision changes [  ];  Amaurosis fugax[  ]; Resp: cough [ y ];  wheezing[ y ];  hemoptysis[n  ]; shortness of breath[y  ]; paroxysmal nocturnal dyspnea[y  ]; dyspnea on exertion[ y ]; or orthopnea[  ];  GI:  gallstones[  ], vomiting[  ];  dysphagia[  ]; melena[  ];  hematochezia [  ]; heartburn[  ];   Hx of  Colonoscopy[  ]; GU: kidney stones [  ]; hematuria[  ];   dysuria [  ];  nocturia[  ];  history of     obstruction [  ]; urinary frequency [  ]             Skin: rash, swelling[  ];, hair loss[  ];  peripheral edema[  ];  or itching[  ]; Musculosketetal: myalgias[  ];  joint swelling[  ];  joint erythema[  ];  joint pain[  ];  back pain[  ];  Heme/Lymph: bruising[  ];  bleeding[  ];  anemia[  ];  Neuro: TIA[ n];  headaches[ n ];  stroke[n  ];  vertigo[n  ];  seizures[ n ];   paresthesias[n  ];  difficulty walking[ n ];  Psych:depression[  ]; anxiety[  ];  Endocrine: diabetes[  n];  thyroid dysfunction[  n];  Immunizations: Flu  up to date Florencio.Farrier  ]; Pneumococcal up to date [ n ];  Other:  Physical Exam: There were no vitals taken for this visit.  PHYSICAL EXAMINATION: Virtual visit no physical exam  Diagnostic Studies & Laboratory data:     Recent Radiology Findings:  Ct Chest W Contrast  Result Date: 07/10/2018 CLINICAL DATA:  Non-small cell lung cancer, status post SBRT EXAM: CT CHEST WITH CONTRAST TECHNIQUE: Multidetector CT imaging of the chest was performed during intravenous contrast  administration. CONTRAST:  35mL OMNIPAQUE IOHEXOL 300 MG/ML  SOLN COMPARISON:  CTA chest dated 03/30/2018.  CT chest dated 04/21/2017. FINDINGS: Cardiovascular: Heart is normal in size.  No pericardial effusion. No evidence of thoracic aortic aneurysm. Atherosclerotic calcifications of the aortic arch. Three vessel coronary atherosclerosis. Mediastinum/Nodes: 10 mm short axis prevascular node (series 2/image 37), unchanged. Otherwise, no suspicious mediastinal lymphadenopathy. Visualized thyroid is unremarkable. Lungs/Pleura: 2.6 x 3.5 cm cavitary central left upper lobe mass (series 7/image 38), previously 3.2 x 3.4 cm when measured in a similar fashion on the most recent prior, previously 3.3 x 3.6 cm in 2019. Adjacent focal radiation changes posteriorly related to SBRT (series 7/image 40). Associated volume loss and subpleural scarring. Small left pleural effusion, partially loculated, significantly improved the most recent prior. No new/suspicious pulmonary nodules. Mild centrilobular and paraseptal emphysematous changes. No pneumothorax. Upper Abdomen: Visualized upper abdomen is notable for an incompletely visualized abdominal aortic aneurysm status post aortobiiliac stent, tiny layering gallstones, and vascular calcifications. Musculoskeletal: Moderate compression fracture deformity at L1, unchanged. Mild anterior wedging of multiple midthoracic vertebral bodies. IMPRESSION: 3.5 cm cavitary central left upper lobe mass, corresponding to  the patient's known primary bronchogenic neoplasm, status post SBRT. 10 mm short axis prevascular node, unchanged. No evidence of new/progressive metastatic disease. Additional stable ancillary findings as above. Aortic Atherosclerosis (ICD10-I70.0) and Emphysema (ICD10-J43.9). Electronically Signed   By: Julian Hy M.D.   On: 07/10/2018 11:48   I have independently reviewed the above radiology studies  and reviewed the findings with the patient.   Mr Jeri Cos Wo Contrast  Result Date: 08/16/2016 CLINICAL DATA:  Squamous cell lung cancer.  Initial staging. EXAM: MRI HEAD WITHOUT AND WITH CONTRAST TECHNIQUE: Multiplanar, multiecho pulse sequences of the brain and surrounding structures were obtained without and with intravenous contrast. CONTRAST:  11mL MULTIHANCE GADOBENATE DIMEGLUMINE 529 MG/ML IV SOLN COMPARISON:  None. FINDINGS: Brain: No acute infarction, hemorrhage, hydrocephalus, extra-axial collection or mass lesion. Generalized cortical atrophy. Minimal microvascular ischemic type changes in the cerebral white matter. Vascular: Major flow voids are present Skull and upper cervical spine: No evidence of marrow lesion Sinuses/Orbits: Negative IMPRESSION: Negative for intracranial metastasis. Electronically Signed   By: Monte Fantasia M.D.   On: 08/16/2016 08:28   Nm Pet Image Initial (pi) Skull Base To Thigh  Result Date: 08/03/2016 CLINICAL DATA:  Initial treatment strategy for cavitary lung lesion. EXAM: NUCLEAR MEDICINE PET SKULL BASE TO THIGH TECHNIQUE: 10.0 mCi F-18 FDG was injected intravenously. Full-ring PET imaging was performed from the skull base to thigh after the radiotracer. CT data was obtained and used for attenuation correction and anatomic localization. FASTING BLOOD GLUCOSE:  Value: 146 mg/dl COMPARISON:  CT chest 07/02/2016 FINDINGS: NECK No hypermetabolic lymph nodes in the neck. CHEST 3.8 cm left upper lobe cavitary lesion demonstrates a thicker ram of soft tissue and less  central cavitation when compared to the chest CT from 1 month ago. The lesion is hypermetabolic with SUV max of 62.6 and is concerning for squamous cell neoplasm. No enlarged or hypermetabolic mediastinal/hilar lymph nodes to suggest metastatic adenopathy. Stable nodularity at the left lung base in the lingula but no hypermetabolism. Recommend attention on future scans. Stable emphysematous changes. Stable atherosclerotic calcifications involving the aorta and branch vessels including the coronary arteries. ABDOMEN/PELVIS No abnormal hypermetabolic activity within the liver, pancreas, adrenal glands, or spleen. No hypermetabolic lymph nodes in the abdomen or pelvis. Marked hypermetabolism noted at the anal rectal junction without discrete mass in the CT  scan. This could be due to internal hemorrhoids but recommend correlation with physical examination. Additional findings include diffuse fatty infiltration of the liver, gallstones, 5.7 x 5.6 infrarenal abdominal aortic aneurysm, bilateral inguinal hernias with the bladder partially in the right inguinal hernia. The aneurysm measured 3.7 x 3.8 cm on the prior CT scan from 2014. SKELETON No focal hypermetabolic activity to suggest skeletal metastasis. IMPRESSION: 1. Hypermetabolic cavitary left upper lobe lung mass consistent with neoplasm. No enlarged or hypermetabolic mediastinal/hilar lymph nodes or evidence of metastatic disease elsewhere. 2. 5.7 x 5.6 cm infrarenal abdominal aortic aneurysm increased since prior CT scan 2014 Vascular surgery consultation recommended due to increased risk of rupture for AAA >5.5 cm. This recommendation follows ACR consensus guidelines: White Paper of the ACR Incidental Findings Committee II on Vascular Findings. J Am Coll Radiol 2013; 10:789-794. 3. Focal area of marked increased uptake in the anal rectal junction region. This could be due to internal hemorrhoids but recommend correlation with physical examination. Electronically  Signed   By: Marijo Sanes M.D.   On: 08/03/2016 10:07     CATH: Procedures   Left Heart Cath and Coronary Angiography  Conclusion     The left ventricular systolic function is normal.  LV end diastolic pressure is normal.  The left ventricular ejection fraction is 55-65% by visual estimate.   1. Nonobstructive CAD 2. Normal LV function 3. Normal LVEDP  Plan: risk factor modification. I suspect his chest pain is more related to his pulmonary abnormality and cough.     Recent Lab Findings: Lab Results  Component Value Date   WBC 4.6 07/10/2018   HGB 11.3 (L) 07/10/2018   HCT 38.2 (L) 07/10/2018   PLT 270 07/10/2018   GLUCOSE 107 (H) 07/10/2018   ALT 13 07/10/2018   AST 33 07/10/2018   NA 135 07/10/2018   K 5.1 07/10/2018   CL 99 07/10/2018   CREATININE 0.79 07/10/2018   BUN 9 07/10/2018   CO2 28 07/10/2018   INR 1.1 04/07/2018   PFT's  FEV1 2.02 63% 20.58 63 %   Pulmonary Function Diagnosis: Moderate Obstructive Airways Disease Restriction -Probable Moderate Diffusion Defect  Assessment / Plan:    3.8 cm left upper lobe cavitary lesion  Hypermetabolic  consistent with neoplasm.  Clinical stage IB squamous cell carcinoma the lung, left upper-lobe.  Treated with stereotactic radiotherapy in 2018.   the patient has moderate airway disease and diffusion deficit , he continues to smoke,   5.7 x 5.6 infrarenal abdominal aortic aneurysm, treated with stent graft  bilateral inguinal hernias with the bladder partially in the right inguinal hernia.  On CT there is no evidence of progression of disease following his stereotactic radiotherapy his history and films were reviewed at the multidisciplinary thoracic oncology conference this morning and the consensus was to proceed with restaging of his malignancy with a PET scan this is been discussed with him over the phone and he is willing to do so we will make the appropriate appointment.,  And review the findings  after the PET scan is done   Grace Isaac MD      [address] [place] [address] Office [phone]   Beeper [phone]  07/27/2018 1:00 PM

## 2018-08-03 ENCOUNTER — Telehealth: Payer: Medicare Other | Admitting: Cardiothoracic Surgery

## 2018-08-03 ENCOUNTER — Ambulatory Visit (HOSPITAL_COMMUNITY): Payer: Medicare Other

## 2018-08-08 ENCOUNTER — Encounter (HOSPITAL_COMMUNITY): Payer: Medicare Other

## 2018-08-10 ENCOUNTER — Telehealth: Payer: Medicare Other | Admitting: Cardiothoracic Surgery

## 2018-08-24 DIAGNOSIS — M5136 Other intervertebral disc degeneration, lumbar region: Secondary | ICD-10-CM | POA: Diagnosis not present

## 2018-08-24 DIAGNOSIS — C3490 Malignant neoplasm of unspecified part of unspecified bronchus or lung: Secondary | ICD-10-CM | POA: Diagnosis not present

## 2018-08-24 DIAGNOSIS — Z79899 Other long term (current) drug therapy: Secondary | ICD-10-CM | POA: Diagnosis not present

## 2018-08-24 DIAGNOSIS — M545 Low back pain: Secondary | ICD-10-CM | POA: Diagnosis not present

## 2018-08-28 ENCOUNTER — Encounter (HOSPITAL_COMMUNITY)
Admission: RE | Admit: 2018-08-28 | Discharge: 2018-08-28 | Disposition: A | Discharge status: Home / Self Care | Payer: Medicare Other | Source: Ambulatory Visit | Attending: Cardiothoracic Surgery | Admitting: Cardiothoracic Surgery

## 2018-08-28 ENCOUNTER — Other Ambulatory Visit: Payer: Self-pay

## 2018-08-28 DIAGNOSIS — J90 Pleural effusion, not elsewhere classified: Secondary | ICD-10-CM | POA: Diagnosis not present

## 2018-08-28 DIAGNOSIS — J439 Emphysema, unspecified: Secondary | ICD-10-CM | POA: Insufficient documentation

## 2018-08-28 DIAGNOSIS — K8020 Calculus of gallbladder without cholecystitis without obstruction: Secondary | ICD-10-CM | POA: Diagnosis not present

## 2018-08-28 DIAGNOSIS — C3490 Malignant neoplasm of unspecified part of unspecified bronchus or lung: Secondary | ICD-10-CM | POA: Diagnosis not present

## 2018-08-28 DIAGNOSIS — I2510 Atherosclerotic heart disease of native coronary artery without angina pectoris: Secondary | ICD-10-CM | POA: Diagnosis not present

## 2018-08-28 DIAGNOSIS — I700 Atherosclerosis of aorta: Secondary | ICD-10-CM | POA: Insufficient documentation

## 2018-08-28 LAB — GLUCOSE, CAPILLARY: Glucose-Capillary: 124 mg/dL — ABNORMAL HIGH (ref 70–99)

## 2018-08-28 MED ORDER — FLUDEOXYGLUCOSE F - 18 (FDG) INJECTION
8.1200 | Freq: Once | INTRAVENOUS | Status: AC | PRN
  Administered 2018-08-28: 8.12 via INTRAVENOUS

## 2018-08-29 ENCOUNTER — Telehealth (INDEPENDENT_AMBULATORY_CARE_PROVIDER_SITE_OTHER): Payer: Medicare Other | Admitting: Cardiothoracic Surgery

## 2018-08-29 DIAGNOSIS — C3490 Malignant neoplasm of unspecified part of unspecified bronchus or lung: Secondary | ICD-10-CM | POA: Diagnosis not present

## 2018-08-29 NOTE — Progress Notes (Signed)
[address]       [place] [address]             [phone]      Telehealth Visit     Virtual Visit via Telephone Note   This visit type was conducted due to national recommendations for restrictions regarding the COVID-19 Pandemic (e.g. social distancing) in an effort to limit this patient's exposure and mitigate transmission in our community.  Due to his co-morbid illnesses, this patient is at least at moderate risk for complications without adequate follow up.  This format is felt to be most appropriate for this patient at this time.  The patient did not have access to video technology/had technical difficulties with video requiring transitioning to audio format only (telephone).  All issues noted in this document were discussed and addressed.  No physical exam could be performed with this format.  This visit type was conducted due to national recommendations for restrictions regarding the COVID-19 Pandemic (e.g. social distancing).  This format is felt to be most appropriate for this patient at this time.  All issues noted in this document were discussed and addressed.  No physical exam was performed (except for noted visual exam findings with Video Visits).      I contacted Irbin Fines remotely due to the limitations of the current COVID pandemic on 08/29/2018  at  2:17 PM  verifying that I was speaking to Juel Burrow whose birthday is 1945/09/25.   I discussed limitations of the evaluation and management  of patients remotely without  the benefit of physical exam.  The patient was agreeable with proceeding with a remote/ not face to face visit.                 [facility] Medical Record #[id number] Date of Birth: 1945/05/14  Referring: Curt Bears, MD Primary Care: [name], MD  Chief Complaint:   Lung cancer  History of Present Illness:    Nate Common 73 y.o. male was seen in the office in 2018 for  For evaluation of abdominal aortic aneurysm and left lung mass. On Memorial Day 2018 weekend the patient noted increasing shortness of breath and chest discomfort ultimately he was admitted to the hospital, had CT scan of the chest and cardiac catheterization. His catheterization was without significant disease and he was discharged home.  Prior to treatment a PET scan was performed on June 11 he underwent bronchoscopy by the pulmonary service.   With biopsy of the left upper lobe:  Diagnosis Lung, transbronchial biopsy - NON-SMALL CELL CARCINOMA, SEE COMMENT. Microscopic Comment There is a fragment with malignant cells and desmoplastic stroma. The morphology favors a squamous cell carcinoma. Other fragments show inflammation including giant cells and fragments of necrosis. AFB, GMS, and PAS are negative for organisms. Dr. Lyndon Code has reviewed the case. Dr. Vaughan Browner was paged on 07/20/2016. There is likely insufficient tissue for ancillary studies. [name] MD  The patient was also noted to have a large abdominal aortic aneurysm, which was repaired with a stent graft September 08 2016   In August 2018 he returned and after further evaluation and review at the multidisciplinary thoracic oncology conference the patient wished to proceed with stereotactic radiotherapy.  He is been followed since that time in the radiation oncology clinic.   I saw him in early 2020 when he presented with a large right pleural effusion.  Following his hospitalization he was  weak enough that he was ultimately discharged to a nursing home rehab center for a while before returning home this was drained a Pleurx catheter was left in and ultimately removed.  The patient returned to the oncology clinic wanting a pulmonary resection.  He noted left chest wall discomfort and thought surgical resection would take care of this.  we reached him by telephone today virtually because he was unable to make it to the office.  Notes that he  is mostly limited in his ability to get around and uses a cane.  At rest he denies shortness of breath but with very little activity becomes dyspneic.   Follow-up PET scan was done after discussion of his case at the multidisciplinary thoracic oncology conference last week.  Current Activity/ Functional Status:  Patient is independent with mobility/ambulation, transfers, ADL's, IADL's.     Zubrod Score: At the time of surgery this patients most appropriate activity status/level should be described as: []     0    Normal activity, no symptoms [x]     1    Restricted in physical strenuous activity but ambulatory, able to do out light work []     2    Ambulatory and capable of self care, unable to do work activities, up and about               >50 % of waking hours                              []     3    Only limited self care, in bed greater than 50% of waking hours []     4    Completely disabled, no self care, confined to bed or chair []     5    Moribund   Past Medical History:  Diagnosis Date   AAA (abdominal aortic aneurysm) without rupture ([facility])    [place] ABD.  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 ([facility]) 08/09/2016     Past Surgical History:  Procedure Laterality Date   ABDOMINAL AORTIC ENDOVASCULAR STENT GRAFT N/A 09/08/2016   Procedure: ABDOMINAL AORTIC ENDOVASCULAR STENT GRAFT;  Surgeon: [name], MD;  Location: [facility];  Service: Vascular;  Laterality: N/A;   BIOPSY  03/31/2018   Procedure: BIOPSY;  Surgeon: Wonda Horner, MD;  Location: [facility] ENDOSCOPY;  Service: Endoscopy;;   CHEST TUBE INSERTION Left 04/07/2018   Procedure: INSERTION PLEURAL DRAINAGE CATHETER USING ULTRASOUND AND FLURO GUIDANCE;  Surgeon: Grace Isaac, MD;  Location: [facility];  Service: Thoracic;  Laterality: Left;   CYSTOSCOPY W/ URETERAL STENT  PLACEMENT Left 10/04/2012   Procedure: CYSTOSCOPY WITH RETROGRADE PYELOGRAM/URETERAL STENT PLACEMENT   "POSSIBLE LEFT STENT";  Surgeon: Alexis Frock, MD;  Location: [facility];  Service: Urology;  Laterality: Left;   ESOPHAGOGASTRODUODENOSCOPY (EGD) WITH PROPOFOL N/A 03/31/2018   Procedure: ESOPHAGOGASTRODUODENOSCOPY (EGD) WITH PROPOFOL;  Surgeon: Wonda Horner, MD;  Location: [facility] ENDOSCOPY;  Service: Endoscopy;  Laterality: N/A;   IR THORACENTESIS ASP PLEURAL SPACE W/IMG GUIDE  03/31/2018   IR THORACENTESIS ASP PLEURAL SPACE W/IMG GUIDE  04/03/2018   IR THORACENTESIS ASP PLEURAL SPACE W/IMG GUIDE  04/04/2018   LEFT HEART CATH AND CORONARY ANGIOGRAPHY N/A 07/02/2016  Procedure: Left Heart Cath and Coronary Angiography;  Surgeon: Martinique, Peter M, MD;  Location: [facility] CV LAB;  Service: Cardiovascular;  Laterality: N/A;   MEATOTOMY N/A 10/04/2012   Procedure: MEATOTOMY ADULT;  Surgeon: Alexis Frock, MD;  Location: [facility];  Service: Urology;  Laterality: N/A;   REMOVAL OF PLEURAL DRAINAGE CATHETER Left 05/24/2018   Procedure: REMOVAL OF PLEURAL DRAINAGE CATHETER;  Surgeon: Grace Isaac, MD;  Location: [facility];  Service: Thoracic;  Laterality: Left;   TRANSURETHRAL RESECTION OF BLADDER TUMOR WITH GYRUS (TURBT-GYRUS) N/A 10/04/2012   Procedure: TRANSURETHRAL RESECTION OF BLADDER TUMOR WITH GYRUS (TURBT-GYRUS);  Surgeon: Alexis Frock, MD;  Location: [facility];  Service: Urology;  Laterality: N/A;   VIDEO BRONCHOSCOPY Bilateral 07/19/2016   Procedure: VIDEO BRONCHOSCOPY WITH FLUORO;  Surgeon: Marshell Garfinkel, MD;  Location: [facility] ENDOSCOPY;  Service: Cardiopulmonary;  Laterality: Bilateral;    Family History  Problem Relation Age of Onset   Cancer Mother    Heart disease Father        before age 53   Heart attack Father    Cancer Brother     Social History   Socioeconomic History   Marital status: Married    Spouse name:  Not on file   Number of children: Not on file   Years of education: Not on file   Highest education level: Not on file  Occupational History   Not on file  Social Needs   Financial resource strain: Not on file   Food insecurity    Worry: Not on file    Inability: Not on file   Transportation needs    Medical: Not on file    Non-medical: Not on file  Tobacco Use   Smoking status: Current Every Day Smoker    Packs/day: 0.50    Years: 50.00    Pack years: 25.00    Types: Cigarettes   Smokeless tobacco: Never Used   Tobacco comment: pt refuses info  Substance and Sexual Activity   Alcohol use: Yes    Alcohol/week: 24.0 standard drinks    Types: 10 Glasses of wine, 14 Cans of beer per week    Comment: few glases wine per day, beer   Drug use: No   Sexual activity: Not on file  Lifestyle   Physical activity    Days per week: Not on file    Minutes per session: Not on file   Stress: Not on file  Relationships   Social connections    Talks on phone: Not on file    Gets together: Not on file    Attends religious service: Not on file    Active member of club or organization: Not on file    Attends meetings of clubs or organizations: Not on file    Relationship status: Not on file   Intimate partner violence    Fear of current or ex partner: Not on file    Emotionally abused: Not on file    Physically abused: Not on file    Forced sexual activity: Not on file  Other Topics Concern   Not on file  Social History Narrative   Not on file    Social History   Tobacco Use  Smoking Status Current Every Day Smoker   Packs/day: 0.50   Years: 50.00   Pack years: 25.00   Types: Cigarettes  Smokeless Tobacco Never Used  Tobacco Comment   pt refuses info    Social History  Substance and Sexual Activity  Alcohol Use Yes   Alcohol/week: 24.0 standard drinks   Types: 10 Glasses of wine, 14 Cans of beer per week   Comment: few glases wine per day,  beer     No Known Allergies  Current Outpatient Medications  Medication Sig Dispense Refill   atenolol (TENORMIN) 50 MG tablet Take 50 mg by mouth daily.     clonazePAM (KLONOPIN) 0.5 MG tablet Take 1 tablet (0.5 mg total) by mouth 2 (two) times daily. 10 tablet 0   Ensure Plus (ENSURE PLUS) LIQD Take 1 Can by mouth 2 (two) times daily between meals.     oxyCODONE (OXY IR/ROXICODONE) 5 MG immediate release tablet Take 1 tablet (5 mg total) by mouth every 6 (six) hours as needed for severe pain. 25 tablet 0   pantoprazole (PROTONIX) 40 MG tablet Take 1 tablet (40 mg total) by mouth 2 (two) times daily. 60 tablet 3   No current facility-administered medications for this visit.     Pertinent items are noted in HPI.   Review of Systems:     Cardiac Review of Systems: Y or N  Chest Pain [ y   ]  Resting SOB Blue.Reese   ] Exertional SOB  Blue.Reese  ]  Orthopnea [ y ]   Pedal Edema [ n  ]    Palpitations [ n ] Syncope  [ n ]   Presyncope [  n ]  General Review of Systems: [Y] = yes [  ]=no Constitional: recent weight change [  ];  Wt loss over the last 3 months [   ] anorexia [  ]; fatigue [  ]; nausea [  ]; night sweats [  ]; fever [  ]; or chills [  ];          Dental: poor dentition[  ]; Last Dentist visit:   Eye : blurred vision [  ]; diplopia [   ]; vision changes [  ];  Amaurosis fugax[  ]; Resp: cough [ y ];  wheezing[ y ];  hemoptysis[n  ]; shortness of breath[y  ]; paroxysmal nocturnal dyspnea[y  ]; dyspnea on exertion[ y ]; or orthopnea[  ];  GI:  gallstones[  ], vomiting[  ];  dysphagia[  ]; melena[  ];  hematochezia [  ]; heartburn[  ];   Hx of  Colonoscopy[  ]; GU: kidney stones [  ]; hematuria[  ];   dysuria [  ];  nocturia[  ];  history of     obstruction [  ]; urinary frequency [  ]             Skin: rash, swelling[  ];, hair loss[  ];  peripheral edema[  ];  or itching[  ]; Musculosketetal: myalgias[  ];  joint swelling[  ];  joint erythema[  ];  joint pain[  ];  back pain[   ];  Heme/Lymph: bruising[  ];  bleeding[  ];  anemia[  ];  Neuro: TIA[ ] ;  headaches[  ];  stroke[  ];  vertigo[ ] ;  seizures[  ];   paresthesias[  ];  difficulty walking[ y ];  Psych:depression[  ]; anxiety[  ];  Endocrine: diabetes[  ];  thyroid dysfunction[ ] ;  Immunizations: Flu up to date Florencio.Farrier  ]; Pneumococcal up to date [ n ];  Other:  Physical Exam: There were no vitals taken for this visit.  PHYSICAL EXAMINATION: Virtual visit no physical exam  Diagnostic Studies &  Laboratory data:     Recent Radiology Findings:  Nm Pet Image Restag (ps) Skull Base To Thigh  Result Date: 08/28/2018 CLINICAL DATA:  Subsequent treatment strategy for lung cancer. EXAM: NUCLEAR MEDICINE PET SKULL BASE TO THIGH TECHNIQUE: 8.1 mCi F-18 FDG was injected intravenously. Full-ring PET imaging was performed from the skull base to thigh after the radiotracer. CT data was obtained and used for attenuation correction and anatomic localization. Fasting blood glucose: 124 mg/dl COMPARISON:  Chest CT 07/10/2018.  PET-CT 08/03/2016 FINDINGS: Mediastinal blood pool activity: SUV max 2.4 Liver activity: SUV max NA NECK: No hypermetabolic lymph nodes in the neck. Incidental CT findings: none CHEST: Left upper lobe mass at 3.5 cm measured previously shows no hypermetabolism with uptake levels today at SUV max = 1.7. No hypermetabolic hilar or mediastinal lymphadenopathy. Low level uptake along the inferior aspect of the left major fissure is nonspecific. Incidental CT findings: Coronary artery calcification is evident. Atherosclerotic calcification is noted in the wall of the thoracic aorta. Stable volume loss left hemithorax with architectural distortion and scarring in the left upper and lower lobes. Dependent chronic atelectasis or scarring associated with chronic small left pleural effusion. Centrilobular emphsyema noted. 7 mm spiculated right lower lobe nodule (27/8) is similar to prior ABDOMEN/PELVIS: Small focus of FDG  uptake in the lateral segment left liver is mildly increased above background mottled hepatic parenchymal uptake. No underlying lesion evident on noncontrast CT. No hypermetabolic lymphadenopathy in the abdomen or pelvis. Incidental CT findings: Fusiform abdominal aortic aneurysm noted status post endograft placement. Native aneurysm sac measures 5.9 x 6.0 cm today which compares to 5.8 x 6.0 cm previously. Tiny calcified gallstones evident. Small bilateral groin hernias contain only fat. SKELETON: No focal hypermetabolic activity to suggest skeletal metastasis. Incidental CT findings: none IMPRESSION: 1. Very low level FDG uptake in the patient's known left upper lobe mass may reflect granulation/scarring from treatment. 2. Stable 7 mm spiculated right lower lobe nodule without hypermetabolism although this nodule is technically below the accepted size threshold for reliable resolution on PET imaging. 3. Emphysema with scarring and architectural distortion in the left lung. Tiny left chronic pleural effusion is stable. 4. Cholelithiasis. 5. 6.0 cm abdominal aortic aneurysm, status post endograft placement. 6.  Aortic Atherosclerois (ICD10-170.0) 7.  Emphysema. (AFB90-X83.9) Electronically Signed   By: Misty Stanley M.D.   On: 08/28/2018 15:11   I have independently reviewed the above radiology studies  and reviewed the findings with the patient.   Mr Jeri Cos Wo Contrast  Result Date: 08/16/2016 CLINICAL DATA:  Squamous cell lung cancer.  Initial staging. EXAM: MRI HEAD WITHOUT AND WITH CONTRAST TECHNIQUE: Multiplanar, multiecho pulse sequences of the brain and surrounding structures were obtained without and with intravenous contrast. CONTRAST:  39mL MULTIHANCE GADOBENATE DIMEGLUMINE 529 MG/ML IV SOLN COMPARISON:  None. FINDINGS: Brain: No acute infarction, hemorrhage, hydrocephalus, extra-axial collection or mass lesion. Generalized cortical atrophy. Minimal microvascular ischemic type changes in the cerebral  white matter. Vascular: Major flow voids are present Skull and upper cervical spine: No evidence of marrow lesion Sinuses/Orbits: Negative IMPRESSION: Negative for intracranial metastasis. Electronically Signed   By: Monte Fantasia M.D.   On: 08/16/2016 08:28   Nm Pet Image Initial (pi) Skull Base To Thigh  Result Date: 08/03/2016 CLINICAL DATA:  Initial treatment strategy for cavitary lung lesion. EXAM: NUCLEAR MEDICINE PET SKULL BASE TO THIGH TECHNIQUE: 10.0 mCi F-18 FDG was injected intravenously. Full-ring PET imaging was performed from the skull base to thigh after  the radiotracer. CT data was obtained and used for attenuation correction and anatomic localization. FASTING BLOOD GLUCOSE:  Value: 146 mg/dl COMPARISON:  CT chest 07/02/2016 FINDINGS: NECK No hypermetabolic lymph nodes in the neck. CHEST 3.8 cm left upper lobe cavitary lesion demonstrates a thicker ram of soft tissue and less central cavitation when compared to the chest CT from 1 month ago. The lesion is hypermetabolic with SUV max of 62.8 and is concerning for squamous cell neoplasm. No enlarged or hypermetabolic mediastinal/hilar lymph nodes to suggest metastatic adenopathy. Stable nodularity at the left lung base in the lingula but no hypermetabolism. Recommend attention on future scans. Stable emphysematous changes. Stable atherosclerotic calcifications involving the aorta and branch vessels including the coronary arteries. ABDOMEN/PELVIS No abnormal hypermetabolic activity within the liver, pancreas, adrenal glands, or spleen. No hypermetabolic lymph nodes in the abdomen or pelvis. Marked hypermetabolism noted at the anal rectal junction without discrete mass in the CT scan. This could be due to internal hemorrhoids but recommend correlation with physical examination. Additional findings include diffuse fatty infiltration of the liver, gallstones, 5.7 x 5.6 infrarenal abdominal aortic aneurysm, bilateral inguinal hernias with the bladder  partially in the right inguinal hernia. The aneurysm measured 3.7 x 3.8 cm on the prior CT scan from 2014. SKELETON No focal hypermetabolic activity to suggest skeletal metastasis. IMPRESSION: 1. Hypermetabolic cavitary left upper lobe lung mass consistent with neoplasm. No enlarged or hypermetabolic mediastinal/hilar lymph nodes or evidence of metastatic disease elsewhere. 2. 5.7 x 5.6 cm infrarenal abdominal aortic aneurysm increased since prior CT scan 2014 Vascular surgery consultation recommended due to increased risk of rupture for AAA >5.5 cm. This recommendation follows ACR consensus guidelines: White Paper of the ACR Incidental Findings Committee II on Vascular Findings. J Am Coll Radiol 2013; 10:789-794. 3. Focal area of marked increased uptake in the anal rectal junction region. This could be due to internal hemorrhoids but recommend correlation with physical examination. Electronically Signed   By: Marijo Sanes M.D.   On: 08/03/2016 10:07     CATH: Procedures   Left Heart Cath and Coronary Angiography  Conclusion     The left ventricular systolic function is normal.  LV end diastolic pressure is normal.  The left ventricular ejection fraction is 55-65% by visual estimate.   1. Nonobstructive CAD 2. Normal LV function 3. Normal LVEDP  Plan: risk factor modification. I suspect his chest pain is more related to his pulmonary abnormality and cough.     Recent Lab Findings: Lab Results  Component Value Date   WBC 4.6 07/10/2018   HGB 11.3 (L) 07/10/2018   HCT 38.2 (L) 07/10/2018   PLT 270 07/10/2018   GLUCOSE 107 (H) 07/10/2018   ALT 13 07/10/2018   AST 33 07/10/2018   NA 135 07/10/2018   K 5.1 07/10/2018   CL 99 07/10/2018   CREATININE 0.79 07/10/2018   BUN 9 07/10/2018   CO2 28 07/10/2018   INR 1.1 04/07/2018   PFT's  FEV1 2.02 63% 20.58 63 %   Pulmonary Function Diagnosis: Moderate Obstructive Airways Disease Restriction -Probable Moderate Diffusion  Defect  Assessment / Plan:    Previous 3.8 cm left upper lobe cavitary lesion  Hypermetabolic  consistent with neoplasm.  Clinical stage IB squamous cell carcinoma the lung, left upper-lobe.  Treated with stereotactic radiotherapy in 2018.   the patient has moderate airway disease and diffusion deficit , he continues to smoke,   5.7 x 5.6 infrarenal abdominal aortic aneurysm, treated with stent  graft  bilateral inguinal hernias with the bladder partially in the right inguinal hernia.  On CT there is no evidence of progression of disease following his stereotactic radiotherapy his history and films were reviewed at the multidisciplinary thoracic oncology conference and the consensus was to proceed with restaging of his malignancy with a PET scan.  I reviewed with the patient the findings of the recently done PET scan that did not demonstrate any evidence of progression of disease, underlying metastatic malignancy, a primary treated mass appears to be controlled by the previous radiation therapy.  I would not recommend resection both considering the patient's PET scan and also his underlying medical condition and respiratory function.  Considered follow-up CT scan as he has been doing in between 4 and 6 months with radiation oncology.   Grace Isaac MD      [address] [place] [address] Office [phone]   Beeper [phone]  08/29/2018 2:17 PM

## 2018-08-31 ENCOUNTER — Other Ambulatory Visit: Payer: Self-pay | Admitting: *Deleted

## 2018-08-31 NOTE — Progress Notes (Signed)
The proposed treatment discussed in cancer conference 08/31/2018 is for discussion purpose only and is not a binding recommendation.  The patient was not physically examined nor present for their treatment options.  Therefore, final treatment plans cannot be decided.

## 2018-09-15 ENCOUNTER — Other Ambulatory Visit: Payer: Medicare Other

## 2018-09-19 ENCOUNTER — Telehealth: Payer: Self-pay | Admitting: *Deleted

## 2018-09-19 ENCOUNTER — Ambulatory Visit: Payer: Medicare Other | Admitting: Internal Medicine

## 2018-09-19 NOTE — Telephone Encounter (Signed)
Received call from patient regarding CT scan scheduled for September.  Pt did not understand why he needed a scan in September when he had a PET scan in July 2020.  Explained that he was is currently on 'observation', that his PET scan did not show metastatic disease and that previously treated lung tumor was stable.  The scan in September is normal f/u scan to check on the status of that tumor. Pt states he has ongoing chest pain and is seeing a doctor in a pain clinic.  He voiced frustration  About scans and said he was thinking of going to [facility] for another opinion. Acknowledged his frustration and offered referral if that is what he wanted. He declined this and stated he was not sure he would come back here in September. Encouraged patient to come in September for his follow up appt. No further conversation with patient as he ended the conversation.

## 2018-09-21 DIAGNOSIS — G893 Neoplasm related pain (acute) (chronic): Secondary | ICD-10-CM | POA: Diagnosis not present

## 2018-09-21 DIAGNOSIS — M545 Low back pain: Secondary | ICD-10-CM | POA: Diagnosis not present

## 2018-09-21 DIAGNOSIS — Z79899 Other long term (current) drug therapy: Secondary | ICD-10-CM | POA: Diagnosis not present

## 2018-09-21 DIAGNOSIS — C3490 Malignant neoplasm of unspecified part of unspecified bronchus or lung: Secondary | ICD-10-CM | POA: Diagnosis not present

## 2018-10-17 DIAGNOSIS — M545 Low back pain: Secondary | ICD-10-CM | POA: Diagnosis not present

## 2018-10-17 DIAGNOSIS — M5136 Other intervertebral disc degeneration, lumbar region: Secondary | ICD-10-CM | POA: Diagnosis not present

## 2018-10-17 DIAGNOSIS — C3490 Malignant neoplasm of unspecified part of unspecified bronchus or lung: Secondary | ICD-10-CM | POA: Diagnosis not present

## 2018-10-17 DIAGNOSIS — Z79899 Other long term (current) drug therapy: Secondary | ICD-10-CM | POA: Diagnosis not present

## 2018-10-20 ENCOUNTER — Other Ambulatory Visit: Payer: Self-pay | Admitting: Physician Assistant

## 2018-10-20 DIAGNOSIS — C7802 Secondary malignant neoplasm of left lung: Secondary | ICD-10-CM

## 2018-10-23 ENCOUNTER — Telehealth: Payer: Self-pay | Admitting: Medical Oncology

## 2018-10-23 ENCOUNTER — Inpatient Hospital Stay: Payer: Medicare Other | Attending: Internal Medicine

## 2018-10-23 NOTE — Telephone Encounter (Signed)
Pt LVM to cancel his appts for lab,scan and f/u and to r/s for October .  "I am not coming in . I just had a scan in June". Schedule request sent and CT scan order extended to [facility].

## 2018-10-25 ENCOUNTER — Inpatient Hospital Stay: Payer: Medicare Other | Admitting: Internal Medicine

## 2018-10-25 ENCOUNTER — Telehealth: Payer: Self-pay | Admitting: Medical Oncology

## 2018-10-25 NOTE — Telephone Encounter (Signed)
err

## 2018-11-07 DIAGNOSIS — M545 Low back pain: Secondary | ICD-10-CM | POA: Diagnosis not present

## 2018-11-07 DIAGNOSIS — C3490 Malignant neoplasm of unspecified part of unspecified bronchus or lung: Secondary | ICD-10-CM | POA: Diagnosis not present

## 2018-11-07 DIAGNOSIS — G893 Neoplasm related pain (acute) (chronic): Secondary | ICD-10-CM | POA: Diagnosis not present

## 2018-11-07 DIAGNOSIS — Z79899 Other long term (current) drug therapy: Secondary | ICD-10-CM | POA: Diagnosis not present

## 2018-11-27 ENCOUNTER — Telehealth: Payer: Self-pay | Admitting: Cardiothoracic Surgery

## 2018-11-27 ENCOUNTER — Telehealth: Payer: Self-pay | Admitting: *Deleted

## 2018-11-27 DIAGNOSIS — J984 Other disorders of lung: Secondary | ICD-10-CM

## 2018-11-27 NOTE — Telephone Encounter (Signed)
Patient called today requesting a call back about the CT scan that had previously been ordered by medical oncology, the patient had had a PET scan in July.  He thought that the originally scheduled CT scan was too early.  From the notes in the chart it appeared that the scan had remained ordered but the date was pushed back until October.  He called today asking about when he should have a scan.  We will contact medical oncology and see if the scan is still ordered or could be put back on the schedule.

## 2018-11-27 NOTE — Telephone Encounter (Signed)
Oncology Nurse Navigator Documentation  Oncology Nurse Navigator Flowsheets 11/27/2018  Navigator Location [facility]  Referral Date to RadOnc/MedOnc -  Navigator Encounter Type Telephone/I received a message from Dr. Servando Snare regarding patient needing follow up ct scan. I follow up with Dr. [name] and patient does not need a scan until Jan 2021.  I called patient to update him.  He hung the phone up on me as I was explaining.  CT scan and follow up will be ordered.   Telephone Education  Treatment Phase Other  Barriers/Navigation Needs Coordination of Care;Education  Education Other  Interventions Coordination of Care;Education  Coordination of Care Other  Education Method Verbal  Acuity Level 2-Minimal Needs (1-2 Barriers Identified)  Time Spent with Patient 30

## 2018-11-28 ENCOUNTER — Telehealth: Payer: Self-pay | Admitting: Internal Medicine

## 2018-11-28 NOTE — Telephone Encounter (Signed)
Scheduled appt per 10/19 sch message - unable to reach pt - mailed reminder letter with appt date and time

## 2018-11-29 ENCOUNTER — Telehealth: Payer: Self-pay

## 2018-11-29 ENCOUNTER — Telehealth: Payer: Self-pay | Admitting: Medical Oncology

## 2018-11-29 NOTE — Telephone Encounter (Signed)
He thinks he needs scan before Jan . I told him [name] discussed with Hinton Dyer and he recommended next scan  in Jan.  Pt stated , "Ok what ever".

## 2018-11-29 NOTE — Telephone Encounter (Signed)
Pt called office d/t concerns about scheduling of CT scan. States that oncology scheduled the scan for late January 2021, and he believes he should have it sooner than this. Per Dr. Everrett Coombe telephone encounter w/ pt on 11/27/18, medical oncology would be determining the scheduling of his CT scan; therefore, advised pt to discuss his concerns w/ his oncologist.

## 2018-12-12 ENCOUNTER — Other Ambulatory Visit: Payer: Self-pay | Admitting: Cardiothoracic Surgery

## 2018-12-12 DIAGNOSIS — M5136 Other intervertebral disc degeneration, lumbar region: Secondary | ICD-10-CM | POA: Diagnosis not present

## 2018-12-12 DIAGNOSIS — C3490 Malignant neoplasm of unspecified part of unspecified bronchus or lung: Secondary | ICD-10-CM | POA: Diagnosis not present

## 2018-12-12 DIAGNOSIS — M545 Low back pain: Secondary | ICD-10-CM | POA: Diagnosis not present

## 2018-12-12 DIAGNOSIS — Z79899 Other long term (current) drug therapy: Secondary | ICD-10-CM | POA: Diagnosis not present

## 2018-12-12 DIAGNOSIS — R911 Solitary pulmonary nodule: Secondary | ICD-10-CM

## 2018-12-28 ENCOUNTER — Ambulatory Visit: Payer: Medicare Other | Admitting: Cardiothoracic Surgery

## 2018-12-28 ENCOUNTER — Other Ambulatory Visit: Payer: Medicare Other

## 2019-01-11 DIAGNOSIS — M545 Low back pain: Secondary | ICD-10-CM | POA: Diagnosis not present

## 2019-01-11 DIAGNOSIS — G893 Neoplasm related pain (acute) (chronic): Secondary | ICD-10-CM | POA: Diagnosis not present

## 2019-01-11 DIAGNOSIS — M5136 Other intervertebral disc degeneration, lumbar region: Secondary | ICD-10-CM | POA: Diagnosis not present

## 2019-01-11 DIAGNOSIS — C3490 Malignant neoplasm of unspecified part of unspecified bronchus or lung: Secondary | ICD-10-CM | POA: Diagnosis not present

## 2019-01-17 ENCOUNTER — Other Ambulatory Visit: Payer: Medicare Other

## 2019-01-18 ENCOUNTER — Ambulatory Visit: Payer: Medicare Other | Admitting: Cardiothoracic Surgery

## 2019-02-12 DIAGNOSIS — Z79899 Other long term (current) drug therapy: Secondary | ICD-10-CM | POA: Diagnosis not present

## 2019-02-12 DIAGNOSIS — M545 Low back pain: Secondary | ICD-10-CM | POA: Diagnosis not present

## 2019-02-12 DIAGNOSIS — C3490 Malignant neoplasm of unspecified part of unspecified bronchus or lung: Secondary | ICD-10-CM | POA: Diagnosis not present

## 2019-02-12 DIAGNOSIS — G893 Neoplasm related pain (acute) (chronic): Secondary | ICD-10-CM | POA: Diagnosis not present

## 2019-02-12 DIAGNOSIS — E7800 Pure hypercholesterolemia, unspecified: Secondary | ICD-10-CM | POA: Diagnosis not present

## 2019-02-12 DIAGNOSIS — Z1159 Encounter for screening for other viral diseases: Secondary | ICD-10-CM | POA: Diagnosis not present

## 2019-02-13 ENCOUNTER — Other Ambulatory Visit: Payer: Medicare Other

## 2019-02-14 ENCOUNTER — Ambulatory Visit: Payer: Medicare Other | Admitting: Internal Medicine

## 2019-03-13 DIAGNOSIS — Z79899 Other long term (current) drug therapy: Secondary | ICD-10-CM | POA: Diagnosis not present

## 2019-03-13 DIAGNOSIS — M5136 Other intervertebral disc degeneration, lumbar region: Secondary | ICD-10-CM | POA: Diagnosis not present

## 2019-03-13 DIAGNOSIS — M545 Low back pain: Secondary | ICD-10-CM | POA: Diagnosis not present

## 2019-03-13 DIAGNOSIS — C3490 Malignant neoplasm of unspecified part of unspecified bronchus or lung: Secondary | ICD-10-CM | POA: Diagnosis not present

## 2019-04-10 DIAGNOSIS — G893 Neoplasm related pain (acute) (chronic): Secondary | ICD-10-CM | POA: Diagnosis not present

## 2019-04-10 DIAGNOSIS — C3490 Malignant neoplasm of unspecified part of unspecified bronchus or lung: Secondary | ICD-10-CM | POA: Diagnosis not present

## 2019-04-10 DIAGNOSIS — M5136 Other intervertebral disc degeneration, lumbar region: Secondary | ICD-10-CM | POA: Diagnosis not present

## 2019-04-10 DIAGNOSIS — M545 Low back pain: Secondary | ICD-10-CM | POA: Diagnosis not present

## 2019-05-07 DIAGNOSIS — C3490 Malignant neoplasm of unspecified part of unspecified bronchus or lung: Secondary | ICD-10-CM | POA: Diagnosis not present

## 2019-05-07 DIAGNOSIS — M5136 Other intervertebral disc degeneration, lumbar region: Secondary | ICD-10-CM | POA: Diagnosis not present

## 2019-05-07 DIAGNOSIS — Z79899 Other long term (current) drug therapy: Secondary | ICD-10-CM | POA: Diagnosis not present

## 2019-05-07 DIAGNOSIS — M545 Low back pain: Secondary | ICD-10-CM | POA: Diagnosis not present

## 2019-09-27 ENCOUNTER — Inpatient Hospital Stay (HOSPITAL_COMMUNITY)
Admission: EM | Admit: 2019-09-27 | Discharge: 2019-10-13 | DRG: 917 | Disposition: A | Discharge status: Home / Self Care | Payer: Medicare Other | Attending: Internal Medicine | Admitting: Internal Medicine

## 2019-09-27 ENCOUNTER — Emergency Department (HOSPITAL_COMMUNITY): Payer: Medicare Other

## 2019-09-27 ENCOUNTER — Encounter (HOSPITAL_COMMUNITY): Payer: Self-pay | Admitting: Internal Medicine

## 2019-09-27 DIAGNOSIS — R45851 Suicidal ideations: Secondary | ICD-10-CM | POA: Diagnosis present

## 2019-09-27 DIAGNOSIS — J9601 Acute respiratory failure with hypoxia: Secondary | ICD-10-CM | POA: Diagnosis present

## 2019-09-27 DIAGNOSIS — Z604 Social exclusion and rejection: Secondary | ICD-10-CM | POA: Diagnosis present

## 2019-09-27 DIAGNOSIS — R600 Localized edema: Secondary | ICD-10-CM | POA: Diagnosis present

## 2019-09-27 DIAGNOSIS — C3412 Malignant neoplasm of upper lobe, left bronchus or lung: Secondary | ICD-10-CM | POA: Diagnosis present

## 2019-09-27 DIAGNOSIS — T402X2A Poisoning by other opioids, intentional self-harm, initial encounter: Secondary | ICD-10-CM | POA: Diagnosis not present

## 2019-09-27 DIAGNOSIS — J690 Pneumonitis due to inhalation of food and vomit: Secondary | ICD-10-CM | POA: Diagnosis present

## 2019-09-27 DIAGNOSIS — Z8551 Personal history of malignant neoplasm of bladder: Secondary | ICD-10-CM

## 2019-09-27 DIAGNOSIS — J90 Pleural effusion, not elsewhere classified: Secondary | ICD-10-CM | POA: Diagnosis present

## 2019-09-27 DIAGNOSIS — M7989 Other specified soft tissue disorders: Secondary | ICD-10-CM | POA: Diagnosis present

## 2019-09-27 DIAGNOSIS — F1010 Alcohol abuse, uncomplicated: Secondary | ICD-10-CM | POA: Diagnosis present

## 2019-09-27 DIAGNOSIS — R188 Other ascites: Secondary | ICD-10-CM

## 2019-09-27 DIAGNOSIS — R609 Edema, unspecified: Secondary | ICD-10-CM | POA: Diagnosis present

## 2019-09-27 DIAGNOSIS — C3432 Malignant neoplasm of lower lobe, left bronchus or lung: Secondary | ICD-10-CM | POA: Diagnosis present

## 2019-09-27 DIAGNOSIS — R0902 Hypoxemia: Secondary | ICD-10-CM

## 2019-09-27 DIAGNOSIS — K7460 Unspecified cirrhosis of liver: Secondary | ICD-10-CM

## 2019-09-27 DIAGNOSIS — F329 Major depressive disorder, single episode, unspecified: Secondary | ICD-10-CM | POA: Diagnosis present

## 2019-09-27 DIAGNOSIS — I714 Abdominal aortic aneurysm, without rupture, unspecified: Secondary | ICD-10-CM | POA: Diagnosis present

## 2019-09-27 DIAGNOSIS — T50902A Poisoning by unspecified drugs, medicaments and biological substances, intentional self-harm, initial encounter: Secondary | ICD-10-CM | POA: Diagnosis present

## 2019-09-27 DIAGNOSIS — K7469 Other cirrhosis of liver: Secondary | ICD-10-CM | POA: Diagnosis present

## 2019-09-27 DIAGNOSIS — F17200 Nicotine dependence, unspecified, uncomplicated: Secondary | ICD-10-CM | POA: Diagnosis present

## 2019-09-27 DIAGNOSIS — G8929 Other chronic pain: Secondary | ICD-10-CM | POA: Diagnosis present

## 2019-09-27 DIAGNOSIS — Z923 Personal history of irradiation: Secondary | ICD-10-CM

## 2019-09-27 DIAGNOSIS — Z634 Disappearance and death of family member: Secondary | ICD-10-CM

## 2019-09-27 DIAGNOSIS — T6592XA Toxic effect of unspecified substance, intentional self-harm, initial encounter: Secondary | ICD-10-CM

## 2019-09-27 DIAGNOSIS — Z79899 Other long term (current) drug therapy: Secondary | ICD-10-CM

## 2019-09-27 DIAGNOSIS — R059 Cough, unspecified: Secondary | ICD-10-CM

## 2019-09-27 DIAGNOSIS — E876 Hypokalemia: Secondary | ICD-10-CM

## 2019-09-27 DIAGNOSIS — K219 Gastro-esophageal reflux disease without esophagitis: Secondary | ICD-10-CM | POA: Diagnosis present

## 2019-09-27 DIAGNOSIS — Z20822 Contact with and (suspected) exposure to covid-19: Secondary | ICD-10-CM | POA: Diagnosis present

## 2019-09-27 DIAGNOSIS — Z79891 Long term (current) use of opiate analgesic: Secondary | ICD-10-CM

## 2019-09-27 DIAGNOSIS — Z599 Problem related to housing and economic circumstances, unspecified: Secondary | ICD-10-CM

## 2019-09-27 DIAGNOSIS — F4310 Post-traumatic stress disorder, unspecified: Secondary | ICD-10-CM | POA: Diagnosis present

## 2019-09-27 DIAGNOSIS — Z716 Tobacco abuse counseling: Secondary | ICD-10-CM

## 2019-09-27 DIAGNOSIS — I119 Hypertensive heart disease without heart failure: Secondary | ICD-10-CM | POA: Diagnosis present

## 2019-09-27 DIAGNOSIS — C7802 Secondary malignant neoplasm of left lung: Secondary | ICD-10-CM | POA: Diagnosis present

## 2019-09-27 DIAGNOSIS — J9811 Atelectasis: Secondary | ICD-10-CM | POA: Diagnosis present

## 2019-09-27 DIAGNOSIS — L89151 Pressure ulcer of sacral region, stage 1: Secondary | ICD-10-CM | POA: Diagnosis present

## 2019-09-27 DIAGNOSIS — F17210 Nicotine dependence, cigarettes, uncomplicated: Secondary | ICD-10-CM | POA: Diagnosis present

## 2019-09-27 HISTORY — DX: Other chronic pain: G89.29

## 2019-09-27 LAB — ACETAMINOPHEN LEVEL: Acetaminophen (Tylenol), Serum: <10 ug/mL — ABNORMAL LOW (ref 10–30)

## 2019-09-27 LAB — COMPREHENSIVE METABOLIC PANEL
ALT: 13 U/L (ref 0–44)
AST: 30 U/L (ref 15–41)
Albumin: 3.1 g/dL — ABNORMAL LOW (ref 3.5–5.0)
Alkaline Phosphatase: 68 U/L (ref 38–126)
Anion gap: 10 (ref 5–15)
BUN: 11 mg/dL (ref 8–23)
CO2: 26 mmol/L (ref 22–32)
Calcium: 8.7 mg/dL — ABNORMAL LOW (ref 8.9–10.3)
Chloride: 100 mmol/L (ref 98–111)
Creatinine, Ser: 1.13 mg/dL (ref 0.61–1.24)
GFR calc Af Amer: >60 mL/min (ref >60)
GFR calc non Af Amer: >60 mL/min (ref >60)
Glucose, Bld: 113 mg/dL — ABNORMAL HIGH (ref 70–99)
Potassium: 4.7 mmol/L (ref 3.5–5.1)
Sodium: 136 mmol/L (ref 135–145)
Total Bilirubin: 0.5 mg/dL (ref 0.3–1.2)
Total Protein: 7.3 g/dL (ref 6.5–8.1)

## 2019-09-27 LAB — CBC WITH DIFFERENTIAL/PLATELET
Abs Immature Granulocytes: 0.05 10*3/uL (ref 0.00–0.07)
Basophils Absolute: 0 10*3/uL (ref 0.0–0.1)
Basophils Relative: 0 %
Eosinophils Absolute: 0 10*3/uL (ref 0.0–0.5)
Eosinophils Relative: 0 %
HCT: 39.1 % (ref 39.0–52.0)
Hemoglobin: 10.6 g/dL — ABNORMAL LOW (ref 13.0–17.0)
Immature Granulocytes: 1 %
Lymphocytes Relative: 4 %
Lymphs Abs: 0.4 10*3/uL — ABNORMAL LOW (ref 0.7–4.0)
MCH: 23.2 pg — ABNORMAL LOW (ref 26.0–34.0)
MCHC: 27.1 g/dL — ABNORMAL LOW (ref 30.0–36.0)
MCV: 85.7 fL (ref 80.0–100.0)
Monocytes Absolute: 0.7 10*3/uL (ref 0.1–1.0)
Monocytes Relative: 9 %
Neutro Abs: 7.2 10*3/uL (ref 1.7–7.7)
Neutrophils Relative %: 86 %
Platelets: 298 10*3/uL (ref 150–400)
RBC: 4.56 MIL/uL (ref 4.22–5.81)
RDW: 18.4 % — ABNORMAL HIGH (ref 11.5–15.5)
WBC: 8.4 10*3/uL (ref 4.0–10.5)
nRBC: 0.2 % (ref 0.0–0.2)

## 2019-09-27 LAB — SALICYLATE LEVEL: Salicylate Lvl: <7 mg/dL — ABNORMAL LOW (ref 7.0–30.0)

## 2019-09-27 LAB — ETHANOL: Alcohol, Ethyl (B): <10 mg/dL (ref <10)

## 2019-09-27 LAB — HIV ANTIBODY (ROUTINE TESTING W REFLEX): HIV Screen 4th Generation wRfx: NONREACTIVE

## 2019-09-27 LAB — LACTIC ACID, PLASMA: Lactic Acid, Venous: 1.7 mmol/L (ref 0.5–1.9)

## 2019-09-27 LAB — SARS CORONAVIRUS 2 BY RT PCR (HOSPITAL ORDER, PERFORMED IN ~~LOC~~ HOSPITAL LAB): SARS Coronavirus 2: NEGATIVE

## 2019-09-27 LAB — BRAIN NATRIURETIC PEPTIDE: B Natriuretic Peptide: 609.5 pg/mL — ABNORMAL HIGH (ref 0.0–100.0)

## 2019-09-27 MED ORDER — ENOXAPARIN SODIUM 40 MG/0.4ML ~~LOC~~ SOLN
40.0000 mg | Freq: Every day | SUBCUTANEOUS | Status: DC
  Administered 2019-09-27 – 2019-10-13 (×17): 40 mg via SUBCUTANEOUS
  Filled 2019-09-27 (×17): qty 0.4

## 2019-09-27 MED ORDER — ONDANSETRON HCL 4 MG/2ML IJ SOLN: 4.0000 mg | Freq: Four times a day (QID) | INTRAMUSCULAR | Status: DC | PRN

## 2019-09-27 MED ORDER — FOLIC ACID 1 MG PO TABS
1.0000 mg | ORAL_TABLET | Freq: Every day | ORAL | Status: DC
  Administered 2019-09-28 – 2019-10-13 (×16): 1 mg via ORAL
  Filled 2019-09-27 (×16): qty 1

## 2019-09-27 MED ORDER — THIAMINE HCL 100 MG PO TABS
100.0000 mg | ORAL_TABLET | Freq: Every day | ORAL | Status: DC
  Administered 2019-09-28 – 2019-10-13 (×16): 100 mg via ORAL
  Filled 2019-09-27 (×16): qty 1

## 2019-09-27 MED ORDER — ADULT MULTIVITAMIN W/MINERALS CH
1.0000 | ORAL_TABLET | Freq: Every day | ORAL | Status: DC
  Administered 2019-09-28 – 2019-10-13 (×16): 1 via ORAL
  Filled 2019-09-27 (×16): qty 1

## 2019-09-27 MED ORDER — LORAZEPAM 1 MG PO TABS
1.0000 mg | ORAL_TABLET | ORAL | Status: AC | PRN
  Filled 2019-09-27 (×2): qty 1

## 2019-09-27 MED ORDER — LORAZEPAM 2 MG/ML IJ SOLN
1.0000 mg | INTRAMUSCULAR | Status: AC | PRN
  Administered 2019-09-29 – 2019-09-30 (×2): 1 mg via INTRAVENOUS
  Filled 2019-09-27 (×2): qty 1

## 2019-09-27 MED ORDER — ACETAMINOPHEN 650 MG RE SUPP: 650.0000 mg | Freq: Four times a day (QID) | RECTAL | Status: DC | PRN

## 2019-09-27 MED ORDER — ACETAMINOPHEN 325 MG PO TABS
650.0000 mg | ORAL_TABLET | Freq: Four times a day (QID) | ORAL | Status: DC | PRN
  Administered 2019-09-30: 650 mg via ORAL
  Filled 2019-09-27 (×2): qty 2

## 2019-09-27 MED ORDER — SODIUM CHLORIDE 0.9 % IV SOLN: INTRAVENOUS | Status: DC

## 2019-09-27 MED ORDER — KETOROLAC TROMETHAMINE 15 MG/ML IJ SOLN
15.0000 mg | Freq: Once | INTRAMUSCULAR | Status: AC
  Administered 2019-09-27: 15 mg via INTRAVENOUS
  Filled 2019-09-27: qty 1

## 2019-09-27 MED ORDER — CLONAZEPAM 0.5 MG PO TABS
1.0000 mg | ORAL_TABLET | Freq: Every evening | ORAL | Status: DC | PRN
  Administered 2019-09-27 – 2019-10-12 (×13): 1 mg via ORAL
  Filled 2019-09-27 (×14): qty 2

## 2019-09-27 MED ORDER — NICOTINE 14 MG/24HR TD PT24
14.0000 mg | MEDICATED_PATCH | Freq: Every day | TRANSDERMAL | Status: DC
  Administered 2019-09-27 – 2019-10-13 (×17): 14 mg via TRANSDERMAL
  Filled 2019-09-27 (×17): qty 1

## 2019-09-27 MED ORDER — OXYCODONE HCL 5 MG PO TABS
20.0000 mg | ORAL_TABLET | Freq: Four times a day (QID) | ORAL | Status: DC
  Administered 2019-09-27 – 2019-09-28 (×4): 20 mg via ORAL
  Filled 2019-09-27 (×4): qty 4

## 2019-09-27 MED ORDER — FUROSEMIDE 10 MG/ML IJ SOLN
40.0000 mg | Freq: Once | INTRAMUSCULAR | Status: AC
  Administered 2019-09-27: 40 mg via INTRAVENOUS
  Filled 2019-09-27: qty 4

## 2019-09-27 MED ORDER — PANTOPRAZOLE SODIUM 40 MG PO TBEC
40.0000 mg | DELAYED_RELEASE_TABLET | Freq: Two times a day (BID) | ORAL | Status: DC
  Administered 2019-09-27 – 2019-10-13 (×33): 40 mg via ORAL
  Filled 2019-09-27 (×34): qty 1

## 2019-09-27 MED ORDER — PIPERACILLIN-TAZOBACTAM 3.375 G IVPB 30 MIN
3.3750 g | Freq: Once | INTRAVENOUS | Status: AC
  Administered 2019-09-27: 3.375 g via INTRAVENOUS
  Filled 2019-09-27: qty 50

## 2019-09-27 MED ORDER — PIPERACILLIN-TAZOBACTAM 3.375 G IVPB
3.3750 g | Freq: Three times a day (TID) | INTRAVENOUS | Status: DC
  Administered 2019-09-27 – 2019-09-29 (×5): 3.375 g via INTRAVENOUS
  Filled 2019-09-27 (×6): qty 50

## 2019-09-27 MED ORDER — THIAMINE HCL 100 MG/ML IJ SOLN
100.0000 mg | Freq: Every day | INTRAMUSCULAR | Status: DC
  Filled 2019-09-27 (×5): qty 2

## 2019-09-27 MED ORDER — HYDRALAZINE HCL 20 MG/ML IJ SOLN: 5.0000 mg | INTRAMUSCULAR | Status: DC | PRN

## 2019-09-27 MED ORDER — POLYETHYLENE GLYCOL 3350 17 G PO PACK
17.0000 g | PACK | Freq: Every day | ORAL | Status: DC | PRN
  Administered 2019-10-02: 17 g via ORAL
  Filled 2019-09-27: qty 1

## 2019-09-27 MED ORDER — DOCUSATE SODIUM 100 MG PO CAPS
100.0000 mg | ORAL_CAPSULE | Freq: Two times a day (BID) | ORAL | Status: DC
  Administered 2019-09-28 – 2019-10-13 (×30): 100 mg via ORAL
  Filled 2019-09-27 (×32): qty 1

## 2019-09-27 MED ORDER — ONDANSETRON HCL 4 MG PO TABS: 4.0000 mg | ORAL_TABLET | Freq: Four times a day (QID) | ORAL | Status: DC | PRN

## 2019-09-27 MED ORDER — BISACODYL 5 MG PO TBEC
5.0000 mg | DELAYED_RELEASE_TABLET | Freq: Every day | ORAL | Status: DC | PRN
  Administered 2019-10-02 – 2019-10-03 (×3): 5 mg via ORAL
  Filled 2019-09-27 (×3): qty 1

## 2019-09-27 NOTE — BH Assessment (Signed)
Pt requested to meet with [facility] Chaplain. Referral made & they expect someone to see pt within next hour.

## 2019-09-27 NOTE — Progress Notes (Signed)
Pharmacy Antibiotic Note  Tyler Mcmillan is a 74 y.o. male admitted on 09/27/2019 with overdose.  Pharmacy has been consulted for Zosyn dosing for aspiration pneumonia. Tm 100, WBC are normal, and SCr is above baseline at 1.13 (baseline 0.8-0.9).  Plan: Zosyn 3.375g IV q8h (4 hour infusion).  Monitor renal function, clinical progress, cultures/sensitivities F/U LOT and de-escalate as able     Temp (24hrs), Avg:100 F (37.8 C), Min:100 F (37.8 C), Max:100 F (37.8 C)  No results for input(s): WBC, CREATININE, LATICACIDVEN, VANCOTROUGH, VANCOPEAK, VANCORANDOM, GENTTROUGH, GENTPEAK, GENTRANDOM, TOBRATROUGH, TOBRAPEAK, TOBRARND, AMIKACINPEAK, AMIKACINTROU, AMIKACIN in the last 168 hours.  CrCl cannot be calculated (Patient's most recent lab result is older than the maximum 21 days allowed.).    No Known Allergies   Thank you for involving pharmacy in this patient's care.  Renold Genta, PharmD, BCPS Clinical Pharmacist 09/27/2019 4:10 AM  **Pharmacist phone directory can be found on [email or web address] listed under [place]**

## 2019-09-27 NOTE — ED Notes (Signed)
TTS in progress 

## 2019-09-27 NOTE — Progress Notes (Signed)
After receiving call from [place] went to see patient who was sleeping.  Behavioral Health paged and said this patient is in 61C and 74 years old.  His wife died in July 27, 2022 He attempted suicide with Overdose of Fentanyl and regrets that.  He is depressed and asked to speak with a chaplain.     09/27/19 1400  Clinical Encounter Type  Visited With Patient not available  Referral From Other (Comment) ([place])  Consult/Referral To Chaplain  Recommendations visit him in ED  Conard Novak, Chaplain

## 2019-09-27 NOTE — BH Assessment (Addendum)
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 Pt attempted suicide by overdosing on approx 40 pills of 20 mg oxycodone around 11:00 am yesterday morning followed by drinking a bottle of red wine. Pt reports he woke about 12 hours later was was surprised he was still alive. Pt was reluctant to call EMS but ultimately chose to do so after considering his the harm it would do to his step-son and step-dtr-in-law to find him like that. Pt states he now regrets taking the suicide attempt and taking the pills.  Pt's wife died this past 13-Aug-2022. He reports she was sick for some time and he took care of her. Pt states aside from crossword puzzles and reading he does not have much to do. Pt states he used to be a very faithful person. When asked, pt stated he would like a visit with the hospital chaplain. Pt reports medication compliance. He acknowledges multiple symptoms of Depression, including isolating, feelings of worthlessness, tearfulness, changes in sleep & appetite, & increased irritability. Pt denies homicidal ideation/ history of violence. Pt denies auditory & visual hallucinations & other symptoms of psychosis. Pt states current stressors include financial, boredom, loneliness & physical pain.   Pt lives alone, and supports include his step-son who he has become quite fond of. Pt denies hx of abuse and trauma. 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 Pt has partial insight and judgment. Pt's memory is intact.?  Pt has no hx of psychiatric tx. He reports recent increase in alcohol use.   Disposition: Letitia Libra, NP recommends inpatient psychiatric tx      ICD-10-CM   1. Suicide attempt by substance  overdose, initial encounter ([place])  T65.92XA   2. Hypoxia  R09.02 DG Chest [facility] 1 View    DG Chest Port 1 View  3. Pleural effusion  J90       CCA Screening, Triage and Referral (STR)  Patient Reported Information How did you hear about [place]? Hospital Discharge  Referral name: [facility]  Whom do you see for routine medical problems? Primary Care  Practice/Facility Name: [facility] in [place]  How Long Has This Been Causing You Problems? > than 6 months (no real friends here, wife sick and then wife died in 08-13-2022)  What Do You Feel Would Help You the Most Today? Other (Comment) (used to be more spirtual)   Have You Recently Been in Any Inpatient Treatment (Hospital/Detox/Crisis Center/28-Day Program)? No  Have You Ever Received Services From [employer] Before? Yes   Have You Recently Had Any Thoughts About Hurting Yourself? Yes  Are You Planning to Commit Suicide/Harm Yourself At This time? Yes   Have you Recently Had Thoughts About Hurting Someone [place]? No  Have You Used Any Alcohol or Drugs in the Past 24 Hours? Yes (drank bottle of wine last night)  How Long Ago Did You Use Drugs or Alcohol? 1200  What Did You Use and How Much? bottle wine   Do You Currently Have a Therapist/Psychiatrist? No   Have You Been Recently Discharged From Any Office Practice or Programs? No    CCA Screening Triage Referral Assessment Type of Contact: Tele-Assessment  Is this Initial or Reassessment? Initial Assessment  Date Telepsych consult ordered  in [facility]:  09/27/19  Time Telepsych consult ordered in [facility]:  0912   Collateral Involvement: Shanon Brow- son in law  Is CPS involved or ever been involved? Never  Is APS involved or ever been involved? Never   Patient Determined To Be At Risk for Harm To Self or Others Based on Review of Patient Reported Information or Presenting Complaint? Yes, for Self-Harm  Location of Assessment: [facility] Assessment [facility]   Does Patient  Present under Involuntary Commitment? Yes  IVC Papers Initial File Date: 09/27/19   [place] of Residence: [place]   Patient Currently Receiving the Following Services: Not Receiving Services   Determination of Need: Emergent (2 hours)   Options For Referral: Inpatient Hospitalization     CCA Biopsychosocial  Intake/Chief Complaint:  CCA Intake With Chief Complaint CCA Part Two Date: 09/27/19 CCA Part Two Time: 1241 Patient's Currently Reported Symptoms/Problems: depression Individual's Strengths: spiritual faith Individual's Preferences: really likes to read and do puzzles, like crosswords  Mental Health Symptoms Depression:  Depression: Increase/decrease in appetite, Hopelessness, Irritability, Sleep (too much or little), Tearfulness, Worthlessness  Mania:  Mania: N/A  Anxiety:   Anxiety: Irritability, Tension  Psychosis:  Psychosis: None  Trauma:  Trauma: None  Obsessions:  Obsessions: Recurrent & persistent thoughts/impulses/images  Compulsions:  Compulsions: N/A  Inattention:  Inattention: N/A  Hyperactivity/Impulsivity:  Hyperactivity/Impulsivity: Fidgets with hands/feet, Feeling of restlessness  Oppositional/Defiant Behaviors:  Oppositional/Defiant Behaviors: None  Emotional Irregularity:  Emotional Irregularity: N/A  Other Mood/Personality Symptoms:      Mental Status Exam Appearance and self-care  Stature:  Stature: Average  Weight:  Weight: Average weight  Clothing:  Clothing: Disheveled  Grooming:  Grooming: Neglected  Cosmetic use:     Posture/gait:  Posture/Gait: Other (Comment), Slumped (wincing in pain)  Motor activity:  Motor Activity: Restless  Sensorium  Attention:  Attention: Normal  Concentration:  Concentration: Normal  Orientation:  Orientation: X5  Recall/memory:  Recall/Memory: Normal  Affect and Mood  Affect:  Affect: Depressed, Tearful  Mood:  Mood: Depressed  Relating  Eye contact:  Eye Contact: Normal  Facial expression:  Facial  Expression: Sad, Tense  Attitude toward examiner:  Attitude Toward Examiner: Cooperative  Thought and Language  Speech flow: Speech Flow: Clear and Coherent  Thought content:  Thought Content: Appropriate to Mood and Circumstances  Preoccupation:  Preoccupations: None  Hallucinations:  Hallucinations: None  Organization:     Transport planner of Knowledge:  Fund of Knowledge: Good  Intelligence:  Intelligence: Above Average  Abstraction:  Abstraction: Normal  Judgement:  Judgement: Fair  Art therapist:  Reality Testing: Realistic  Insight:  Insight: Fair  Decision Making:  Decision Making: Vacilates  Social Functioning  Social Maturity:  Social Maturity: Isolates  Social Judgement:  Social Judgement: Normal  Stress  Stressors:  Stressors: Grief/losses, Illness  Coping Ability:  Coping Ability: Deficient supports  Skill Deficits:  Skill Deficits: Interpersonal, Responsibility, Self-care  Supports:  Supports: Family     Religion: Religion/Spirituality Are You A Religious Person?: Yes  Leisure/Recreation: Leisure / Recreation Do You Have Hobbies?: Yes Leisure and Hobbies: puzzles, puzzles, reading  Exercise/Diet: Exercise/Diet Do You Exercise?: No Have You Gained or Lost A Significant Amount of Weight in the Past Six Months?: Yes-Lost Do You Follow a Special Diet?: Yes Type of Diet: can't eat hard type foods due to teeth being in bad shap Do You Have Any Trouble Sleeping?: Yes Explanation of Sleeping Difficulties: 3   CCA Employment/Education  Employment/Work Situation: Employment /  Work Situation Employment situation: Retired Has patient ever been in the [employer]?: Yes (Describe in comment) Education officer, community)  Education: Education Is Patient Currently Attending School?: No Did Teacher, adult education From [employer]?: Yes   CCA Family/Childhood History  Family and Relationship History: Family history Marital status: Widowed Widowed, when?: 07/2019 Does patient have  children?: No (step-son, David)  Childhood History:  Childhood History By whom was/is the patient raised?: Both parents       CCA Substance Use  Alcohol/Drug Use: Alcohol / Drug Use Pain Medications: See MAR Prescriptions: See MAR Over the Counter: See MAR History of alcohol / drug use?: Yes Substance #1 Name of Substance 1: Wine      Recommendations for Services/Supports/Treatments:    DSM5 Diagnoses: Patient Active Problem List   Diagnosis Date Noted  . Acute respiratory failure with hypoxia ([place]) 09/27/2019  . Acute on chronic respiratory failure with hypoxia ([place]) 04/06/2018  . Malnutrition of moderate degree 04/05/2018  . Pressure injury of skin 04/05/2018  . GI bleed 03/31/2018  . Acute post-hemorrhagic anemia   . Malignant neoplasm metastatic to left lung ([place])   . Peripheral edema   . Pleural effusion on left   . Pleural effusion, left   . Fall at home, initial encounter   . Generalized weakness   . Acute GI bleeding 03/30/2018  . 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 Hypertensive heart disease without heart failure   . Chest pain 07/02/2016  . Cavitary lesion of lung 07/02/2016  . Unstable angina ([place])   . AAA (abdominal aortic aneurysm) ([place]) 07/09/2014  . Lumbar degenerative disc disease 07/09/2014      [place]

## 2019-09-27 NOTE — ED Notes (Signed)
Pt A&O x 3. Still does not want to comply  with medical treatment but is not resisting. Attempting to locate MD for further medication admin. Pt stated that he has a DNR and wants to die peacefully and does not want to suffer in pain and wants to die with dignity. Pt Pulled Out IV as I was typing this note.

## 2019-09-27 NOTE — ED Notes (Signed)
Dr Lorin Mercy walked in and assessed PT before Completing last note. Vitals are being taken and a plan is in place

## 2019-09-27 NOTE — ED Triage Notes (Signed)
Pt here from home with an SI attempt pt took approx 36 oxycodone and drank a large bottle of wine , cbg 123 , pt stats 78 % with ems pt placed on 4 liters  With ems

## 2019-09-27 NOTE — ED Provider Notes (Addendum)
°  Physical Exam  BP 131/61    Pulse 87    Temp 100 F (37.8 C) (Oral)    Resp 10    SpO2 97%   Physical Exam  ED Course/Procedures     Procedures  MDM  Patient's been seen by hospitalist now.  Patient is awake appropriate and not hypoxic.  Medically cleared by her.  Will have patient seen by TTS now.       Davonna Belling, MD 09/27/19 [phone]  Called to see patient.  Sats are in the 70s and 80s on room air.  Still not dyspneic and still well-appearing discussed with Dr. Lorin Mercy and she will now obs him.    Davonna Belling, MD 09/27/19 [phone]

## 2019-09-27 NOTE — ED Notes (Signed)
Attempted report x1. 

## 2019-09-27 NOTE — Progress Notes (Signed)
Pt meets inpatient criteria per Letitia Libra, NP. Referral information has been sent to the following hospitals for review:   [facility]  [facility]      Disposition will continue to assist with inpatient placement needs.    Audree Camel, LCSW, [place] Disposition [place] [facility]/TTS [phone]

## 2019-09-27 NOTE — ED Provider Notes (Addendum)
[facility] EMERGENCY DEPARTMENT Provider Note  CSN: [id number] Arrival date & time: 09/27/19 9024  Chief Complaint(s) No chief complaint on file.  HPI Tyler Mcmillan is a 74 y.o. male who presents after a suicide attempt by OD on approx 40 pills of 20mg  oxycodone (total of approx 800mg ), which in ingested around 11:00 - 11:30a yesterday morning. This was followed by drinking a bottle of red wine. Afterwards, he reports falling asleep in bed, then waking up around 12 hrs later and being surprised that he was still alive. He was reluctant to call EMS but ultimately chose to do so. When EMS arrived, patient was sating in the 70s% on RA and was placed on 4LNC. He has a h/o malignant lung cancer, but does not require oxygen at home.  Currently he denies any chest pain, N/V, abd pain. He does have chronic BLE edema.  He reports that he chose to overdose because he has nothing to really live for. His wife passed away in July 27, 2022 of this year. All he does at home is watch TV and try to do work puzzles to keep his mind sharp. He does no leave his home. His appetite is minimal and does not eat nor hydrate well.  HPI  Past Medical History Past Medical History:  Diagnosis Date  . AAA (abdominal aortic aneurysm) without rupture ([place])    [place] ABD.  DONE  JUNE 2014  3.5  . Bladder cancer ([place])   . Bladder neoplasm   . Chronic anxiety   . GERD (gastroesophageal reflux disease)   . Hypertension   . 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 Acute respiratory failure with hypoxia ([place]) 09/27/2019  . Acute on chronic respiratory failure with hypoxia ([place]) 04/06/2018  . Malnutrition of moderate degree 04/05/2018  . Pressure injury of skin 04/05/2018  . GI bleed 03/31/2018  . Acute post-hemorrhagic anemia   . Malignant neoplasm metastatic to left lung ([place])   . Peripheral edema   . Pleural effusion on left    . Pleural effusion, left   . Fall at home, initial encounter   . Generalized weakness   . Acute GI bleeding 03/30/2018  . Malignant neoplasm of bronchus of left upper lobe ([place]) 08/09/2016  . Hypertensive heart disease without heart failure   . Chest pain 07/02/2016  . Cavitary lesion of lung 07/02/2016  . Unstable angina ([place])   . AAA (abdominal aortic aneurysm) ([place]) 07/09/2014  . Lumbar degenerative disc disease 07/09/2014   Home Medication(s) Prior to Admission medications   Medication Sig Start Date End Date Taking? Authorizing Provider  clonazePAM (KLONOPIN) 1 MG tablet Take 1 mg by mouth at bedtime as needed for anxiety.  09/04/19  Yes [provider]  Oxycodone HCl 20 MG TABS Take 20 mg by mouth every 6 (six) hours. 09/11/19  Yes [provider]  pantoprazole (PROTONIX) 40 MG tablet Take 1 tablet (40 mg total) by mouth 2 (two) times daily. 04/08/18  Yes Charlynne Cousins, MD  clonazePAM (KLONOPIN) 0.5 MG tablet Take 1 tablet (0.5 mg total) by mouth 2 (two) times daily. Patient not taking: Reported on 09/27/2019 04/10/18   Charlynne Cousins, MD  oxyCODONE (OXY IR/ROXICODONE) 5 MG immediate release tablet Take 1 tablet (5 mg total) by mouth every 6 (six) hours as needed for severe pain. Patient not taking: Reported on 09/27/2019 05/19/18   Servando Snare,  Lilia Argue, MD                                                                                                                                    Past Surgical History Past Surgical History:  Procedure Laterality Date  . ABDOMINAL AORTIC ENDOVASCULAR STENT GRAFT N/A 09/08/2016   Procedure: ABDOMINAL AORTIC ENDOVASCULAR STENT GRAFT;  Surgeon: [name], MD;  Location: [facility];  Service: Vascular;  Laterality: N/A;  . BIOPSY  03/31/2018   Procedure: BIOPSY;  Surgeon: Wonda Horner, MD;  Location: [facility] ENDOSCOPY;  Service: Endoscopy;;  . CHEST TUBE INSERTION Left 04/07/2018   Procedure: INSERTION PLEURAL DRAINAGE CATHETER  USING ULTRASOUND AND FLURO GUIDANCE;  Surgeon: Grace Isaac, MD;  Location: [facility];  Service: Thoracic;  Laterality: Left;  . CYSTOSCOPY W/ URETERAL STENT PLACEMENT Left 10/04/2012   Procedure: CYSTOSCOPY WITH RETROGRADE PYELOGRAM/URETERAL STENT PLACEMENT   "POSSIBLE LEFT STENT";  Surgeon: Alexis Frock, MD;  Location: [facility];  Service: Urology;  Laterality: Left;  . ESOPHAGOGASTRODUODENOSCOPY (EGD) WITH PROPOFOL N/A 03/31/2018   Procedure: ESOPHAGOGASTRODUODENOSCOPY (EGD) WITH PROPOFOL;  Surgeon: Wonda Horner, MD;  Location: [facility] ENDOSCOPY;  Service: Endoscopy;  Laterality: N/A;  . IR THORACENTESIS ASP PLEURAL SPACE W/IMG GUIDE  03/31/2018  . IR THORACENTESIS ASP PLEURAL SPACE W/IMG GUIDE  04/03/2018  . IR THORACENTESIS ASP PLEURAL SPACE W/IMG GUIDE  04/04/2018  . LEFT HEART CATH AND CORONARY ANGIOGRAPHY N/A 07/02/2016   Procedure: Left Heart Cath and Coronary Angiography;  Surgeon: Martinique, Peter M, MD;  Location: [facility];  Service: Cardiovascular;  Laterality: N/A;  . MEATOTOMY N/A 10/04/2012   Procedure: MEATOTOMY ADULT;  Surgeon: Alexis Frock, MD;  Location: [facility];  Service: Urology;  Laterality: N/A;  . REMOVAL OF PLEURAL DRAINAGE CATHETER Left 05/24/2018   Procedure: REMOVAL OF PLEURAL DRAINAGE CATHETER;  Surgeon: Grace Isaac, MD;  Location: [facility];  Service: Thoracic;  Laterality: Left;  . TRANSURETHRAL RESECTION OF BLADDER TUMOR WITH GYRUS (TURBT-GYRUS) N/A 10/04/2012   Procedure: TRANSURETHRAL RESECTION OF BLADDER TUMOR WITH GYRUS (TURBT-GYRUS);  Surgeon: Alexis Frock, MD;  Location[facility];  Service: Urology;  Laterality: N/A;  . VIDEO BRONCHOSCOPY Bilateral 07/19/2016   Procedure: VIDEO BRONCHOSCOPY WITH FLUORO;  Surgeon: Marshell Garfinkel, MD;  Location: [facility] ENDOSCOPY;  Service: Cardiopulmonary;  Laterality: Bilateral;   Family History Family History  Problem Relation Age of Onset  . Cancer Mother   . Heart  disease Father        before age 22  . Heart attack Father   . Cancer Brother     Social History Social History   Tobacco Use  . Smoking status: Current Every Day Smoker    Packs/day: 0.50    Years: 50.00    Pack years: 25.00    Types: Cigarettes  . Smokeless tobacco: Never Used  . Tobacco comment: pt refuses  info  Vaping Use  . Vaping Use: Never used  Substance Use Topics  . Alcohol use: Yes    Alcohol/week: 24.0 standard drinks    Types: 10 Glasses of wine, 14 Cans of beer per week    Comment: few glases wine per day, beer  . Drug use: No   Allergies Patient has no known allergies.  Review of Systems Review of Systems All other systems are reviewed and are negative for acute change except as noted in the HPI  Physical Exam Vital Signs  I have reviewed the triage vital signs BP (!) 144/75   Pulse (!) 102   Temp 100 F (37.8 C) (Oral)   Resp (!) 24   SpO2 97%   Physical Exam Vitals reviewed.  Constitutional:      General: He is not in acute distress.    Appearance: He is well-developed. He is not diaphoretic.  HENT:     Head: Normocephalic and atraumatic.     Comments: Dark purple staining on his lips, teeth, and tongue.    Nose: Nose normal.  Eyes:     General: No scleral icterus.       Right eye: No discharge.        Left eye: No discharge.     Conjunctiva/sclera: Conjunctivae normal.     Pupils: Pupils are equal, round, and reactive to light.  Cardiovascular:     Rate and Rhythm: Normal rate and regular rhythm.     Heart sounds: No murmur heard.  No friction rub. No gallop.   Pulmonary:     Effort: Pulmonary effort is normal. No respiratory distress.     Breath sounds: No stridor. Examination of the left-middle field reveals decreased breath sounds. Examination of the right-lower field reveals decreased breath sounds. Examination of the left-lower field reveals decreased breath sounds. Decreased breath sounds present. No rales.  Abdominal:      General: There is no distension.     Palpations: Abdomen is soft.     Tenderness: There is no abdominal tenderness.  Musculoskeletal:        General: No tenderness.     Cervical back: Normal range of motion and neck supple.     Right lower leg: 2+ Pitting Edema present.     Left lower leg: 2+ Pitting Edema present.  Skin:    General: Skin is warm and dry.     Findings: No erythema or rash.  Neurological:     Mental Status: He is alert and oriented to person, place, and time.     ED Results and Treatments Labs (all labs ordered are listed, but only abnormal results are displayed) Labs Reviewed  COMPREHENSIVE METABOLIC PANEL - Abnormal; Notable for the following components:      Result Value   Glucose, Bld 113 (*)    Calcium 8.7 (*)    Albumin 3.1 (*)    All other components within normal limits  SALICYLATE LEVEL - Abnormal; Notable for the following components:   Salicylate Lvl <0.1 (*)    All other components within normal limits  ACETAMINOPHEN LEVEL - Abnormal; Notable for the following components:   Acetaminophen (Tylenol), Serum <10 (*)    All other components within normal limits  CBC WITH DIFFERENTIAL/PLATELET - Abnormal; Notable for the following components:   Hemoglobin 10.6 (*)    MCH 23.2 (*)    MCHC 27.1 (*)    RDW 18.4 (*)    Lymphs Abs 0.4 (*)    All  other components within normal limits  BRAIN NATRIURETIC PEPTIDE - Abnormal; Notable for the following components:   B Natriuretic Peptide 609.5 (*)    All other components within normal limits  CULTURE, BLOOD (ROUTINE X 2)  CULTURE, BLOOD (ROUTINE X 2)  SARS CORONAVIRUS 2 BY RT PCR (HOSPITAL ORDER, [facility])  ETHANOL  LACTIC ACID, PLASMA  RAPID URINE DRUG SCREEN, HOSP PERFORMED  LACTIC ACID, PLASMA  CBG MONITORING, ED                                                                                                                         EKG  EKG  Interpretation  Date/Time:  Thursday September 27 2019 04:17:25 EDT Ventricular Rate:  99 PR Interval:    QRS Duration: 97 QT Interval:  366 QTC Calculation: 470 R Axis:   85 Text Interpretation: Sinus rhythm Borderline right axis deviation Borderline T abnormalities, anterior leads NO STEMI. Confirmed by Addison Lank [phone]) on 09/27/2019 8:40:16 AM      Radiology DG Chest Port 1 View  Result Date: 09/27/2019 CLINICAL DATA:  Suicide attempt, took 36 oxycodone and drink a bottle of wine EXAM: PORTABLE CHEST 1 VIEW COMPARISON:  CT 07/10/2018 FINDINGS: Chronic left pleural effusion resulting in some gradient opacity of the left lung reflecting layering fluid. Chronic scarring and architectural distortion changes in the left upper lung as well as a more masslike opacity likely corresponding to a site of prior malignancy and or treatment related change. Background of chronically coarsened interstitial changes throughout both lungs. No right effusion. No visible pneumothorax. The aorta is calcified. The remaining cardiomediastinal contours are unremarkable. Stable mild cardiomegaly. No acute osseous or soft tissue abnormality. Degenerative changes are present in the imaged spine and shoulders. Telemetry leads overlie the chest. IMPRESSION: 1. Chronic left pleural effusion, not significantly changed from prior counting for differences in technique and inflation. 2. Low volumes and atelectasis. 3. Chronic scarring and architectural distortion changes in the left upper lung 4. Masslike opacity likely corresponding to a site of prior malignancy and or treatment related change in the left upper lobe. 5. Additional chronic coarsened interstitial changes. 6. Stable cardiomegaly. 7.  Aortic Atherosclerosis (ICD10-I70.0). Electronically Signed   By: Lovena Le M.D.   On: 09/27/2019 04:40    Pertinent labs & imaging results that were available during my care of the patient were reviewed by me and considered in  my medical decision making (see chart for details).  Medications Ordered in ED Medications  furosemide (LASIX) injection 40 mg (has no administration in time range)  piperacillin-tazobactam (ZOSYN) IVPB 3.375 g (has no administration in time range)  piperacillin-tazobactam (ZOSYN) IVPB 3.375 g (0 g Intravenous Stopped 09/27/19 0602)  Procedures .Critical Care Performed by: Fatima Blank, MD Authorized by: Fatima Blank, MD     CRITICAL CARE Performed by: Grayce Sessions Keilan Nichol Total critical care time: 40 minutes Critical care time was exclusive of separately billable procedures and treating other patients. Critical care was necessary to treat or prevent imminent or life-threatening deterioration. Critical care was time spent personally by me on the following activities: development of treatment plan with patient and/or surrogate as well as nursing, discussions with consultants, evaluation of patient's response to treatment, examination of patient, obtaining history from patient or surrogate, ordering and performing treatments and interventions, ordering and review of laboratory studies, ordering and review of radiographic studies, pulse oximetry and re-evaluation of patient's condition.  (including critical care time)  Medical Decision Making / ED Course I have reviewed the nursing notes for this encounter and the patient's prior records (if available in EHR or on provided paperwork).   Tyler Mcmillan was evaluated in Emergency Department on 09/27/2019 for the symptoms described in the history of present illness. He was evaluated in the context of the global COVID-19 pandemic, which necessitated consideration that the patient might be at risk for infection with the SARS-CoV-2 virus that causes COVID-19. Institutional protocols and  algorithms that pertain to the evaluation of patients at risk for COVID-19 are in a state of rapid change based on information released by regulatory bodies including the CDC and federal and state organizations. These policies and algorithms were followed during the patient's care in the ED.  Suicide attempt by OD on narcotics in patient with malignant lung cancer.  satting 70% on RA. 98% on 5LNC. Decreased lung sounds on LLL.  Will get labs and imaging.  IVC due to depression and suicide attempt. Given O2 demand, he will need admission. Will attempt to determine cause and treat accordingly.  Given empiric Abx for possible aspiration PNA.  CXR with large left pleural effusion. BNP elevated. Given lasix for possible HF.  Rest of labs reassuring.  Admitted to medicine for further work up and management.  Will need psych evaluation during admission.        Final Clinical Impression(s) / ED Diagnoses Final diagnoses:  Hypoxia  Suicide attempt by substance overdose, initial encounter [facility])  Pleural effusion      This chart was dictated using voice recognition software.  Despite best efforts to proofread,  errors can occur which can change the documentation meaning.     Fatima Blank, MD 09/27/19 [phone]

## 2019-09-27 NOTE — ED Notes (Signed)
Pt pulled all leads off and Nasal Canula. Pt stated that he does not want these things on and he wants to home. Pt has been IVC and I explained the process to him. Pt Is somewhat cooperative but does not want to comply with medical help.

## 2019-09-27 NOTE — H&P (Signed)
History and Physical   Tyler Mcmillan IHK:[id number] DOB: 1945/04/10 DOA: 09/27/2019  PCP:  Mee Hives, [place] Consultants:  [place]; [name] - oncology; [facility] - pulmonology; Pain management - [place] Patient coming from:  Home - lives alone since his wife died in 2022/08/01; [name]: Charissa Bash, [phone]  Chief Complaint: Overdose  HPI: Tyler Mcmillan is a 74 y.o. male with medical history significant of SCC of left lung (2018); HTN; anxiety d/o; bladder cancer; and AAA presenting with intentional overdose.  He has been having difficulty, depending on his step-son and his wife.  He doesn't have anyone and relies on TV and crossword puzzles.  He decided to take a bunch of oxycodone (40 tablets x 20 mg).  He woke up about 12 hours later and was disappointed to not be dead.  He starting trying to decide what to do.  "Hindsight's 20/20, I should have just gone back to bed."  He decided to call 911 to not put his step-son through it.  He reports that he is not thinking about hurting himself now.  He was thinking about just going to sleep and passing on and not being a burden.  He is tired of being a burden on people, tired of being on constant pain.  He is not feeling SOB, but is hoarse when he talks too much.  No significant cough - other than from smoking.  He hasn't left the house really since his wife died.    ED Course:   Carryover, per Dr. Hal Hope:  74 year old male with known history of lung cancer with pleural effusion attempted suicide yesterday morning around 11 AM consuming around 30 tablets of oxycodone and a bottle of wine later in the evening he woke up and called EMS was found to be hypoxic and was brought to the ER. In the ER is on 4 L oxygen chest x-ray shows some effusion. Patient was hypoxic's with some peripheral edema ER physician gave Lasix and also antibiotics for possible aspiration. Covid test is pending. Admitted for hypoxic  respiratory failure could be from edema versus aspiration. IVC for suicide attempt.  Review of Systems: As per HPI; otherwise review of systems reviewed and negative.   Ambulatory Status:  Ambulates with a cane or walker  COVID Vaccine Status:  None - willing to get it  Past Medical History:  Diagnosis Date  . AAA (abdominal aortic aneurysm) without rupture ([place])    [place] ABD.  DONE  JUNE 2014  3.5  . Bladder cancer ([place])   . Chronic anxiety   . Chronic pain   . GERD (gastroesophageal reflux disease)   . Hypertension   . 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 ABDOMINAL AORTIC ENDOVASCULAR STENT GRAFT N/A 09/08/2016   Procedure: ABDOMINAL AORTIC ENDOVASCULAR STENT GRAFT;  Surgeon: [name], MD;  Location: [place];  Service: Vascular;  Laterality: N/A;  . BIOPSY  03/31/2018   Procedure: BIOPSY;  Surgeon: Wonda Horner, MD;  Location: [facility] ENDOSCOPY;  Service: Endoscopy;;  . CHEST TUBE INSERTION Left 04/07/2018   Procedure: INSERTION PLEURAL DRAINAGE CATHETER USING ULTRASOUND AND FLURO GUIDANCE;  Surgeon: Grace Isaac, MD;  Location: [place];  Service: Thoracic;  Laterality: Left;  . CYSTOSCOPY W/ URETERAL STENT PLACEMENT Left 10/04/2012   Procedure: CYSTOSCOPY WITH RETROGRADE PYELOGRAM/URETERAL STENT PLACEMENT   "POSSIBLE LEFT STENT";  Surgeon: Alexis Frock, MD;  Location: [place];  Service: Urology;  Laterality: Left;  . ESOPHAGOGASTRODUODENOSCOPY (EGD) WITH PROPOFOL N/A 03/31/2018   Procedure: ESOPHAGOGASTRODUODENOSCOPY (EGD) WITH PROPOFOL;  Surgeon: Wonda Horner, MD;  Location: [facility] ENDOSCOPY;  Service: Endoscopy;  Laterality: N/A;  . IR THORACENTESIS ASP PLEURAL SPACE W/IMG GUIDE  03/31/2018  . IR THORACENTESIS ASP PLEURAL SPACE W/IMG GUIDE  04/03/2018  . IR THORACENTESIS ASP PLEURAL SPACE W/IMG GUIDE  04/04/2018  . LEFT HEART CATH AND CORONARY ANGIOGRAPHY N/A 07/02/2016   Procedure: Left  Heart Cath and Coronary Angiography;  Surgeon: Martinique, Peter M, MD;  Location: [place] CV LAB;  Service: Cardiovascular;  Laterality: N/A;  . MEATOTOMY N/A 10/04/2012   Procedure: MEATOTOMY ADULT;  Surgeon: Alexis Frock, MD;  Location: [facility];  Service: Urology;  Laterality: N/A;  . REMOVAL OF PLEURAL DRAINAGE CATHETER Left 05/24/2018   Procedure: REMOVAL OF PLEURAL DRAINAGE CATHETER;  Surgeon: Grace Isaac, MD;  Location: [place];  Service: Thoracic;  Laterality: Left;  . TRANSURETHRAL RESECTION OF BLADDER TUMOR WITH GYRUS (TURBT-GYRUS) N/A 10/04/2012   Procedure: TRANSURETHRAL RESECTION OF BLADDER TUMOR WITH GYRUS (TURBT-GYRUS);  Surgeon: Alexis Frock, MD;  Location: [facility];  Service: Urology;  Laterality: N/A;  . VIDEO BRONCHOSCOPY Bilateral 07/19/2016   Procedure: VIDEO BRONCHOSCOPY WITH FLUORO;  Surgeon: Marshell Garfinkel, MD;  Location: [facility] ENDOSCOPY;  Service: Cardiopulmonary;  Laterality: Bilateral;    Social History   Socioeconomic History  . Marital status: Married    Spouse name: Not on file  . Number of children: Not on file  . Years of education: Not on file  . Highest education level: Not on file  Occupational History  . Not on file  Tobacco Use  . Smoking status: Current Every Day Smoker    Packs/day: 0.50    Years: 50.00    Pack years: 25.00    Types: Cigarettes  . Smokeless tobacco: Never Used  . Tobacco comment: pt refuses info  Vaping Use  . Vaping Use: Never used  Substance and Sexual Activity  . Alcohol use: Yes    Alcohol/week: 24.0 standard drinks    Types: 10 Glasses of wine, 14 Cans of beer per week    Comment: few glases wine per day, beer  . Drug use: No  . Sexual activity: Not on file  Other Topics Concern  . Not on file  Social History Narrative  . Not on file   Social Determinants of Health   Financial Resource Strain:   . Difficulty of Paying Living Expenses: Not on file  Food Insecurity:   .  Worried About Charity fundraiser in the Last Year: Not on file  . Ran Out of Food in the Last Year: Not on file  Transportation Needs:   . Lack of Transportation (Medical): Not on file  . Lack of Transportation (Non-Medical): Not on file  Physical Activity:   . Days of Exercise per Week: Not on file  . Minutes of Exercise per Session: Not on file  Stress:   . Feeling of Stress : Not on file  Social Connections:   . Frequency of Communication with Friends and Family: Not on file  . Frequency of Social Gatherings with Friends and Family: Not on file  . Attends Religious Services: Not on file  . Active Member of Clubs or Organizations: Not on file  . Attends Archivist Meetings: Not on file  . Marital Status: Not on file  Intimate  Partner Violence:   . Fear of Current or Ex-Partner: Not on file  . Emotionally Abused: Not on file  . Physically Abused: Not on file  . Sexually Abused: Not on file    No Known Allergies  Family History  Problem Relation Age of Onset  . Cancer Mother   . Heart disease Father        before age 83  . Heart attack Father   . Cancer Brother     Prior to Admission medications   Medication Sig Start Date End Date Taking? Authorizing Provider  clonazePAM (KLONOPIN) 1 MG tablet Take 1 mg by mouth at bedtime as needed for anxiety.  09/04/19  Yes [provider]  Oxycodone HCl 20 MG TABS Take 20 mg by mouth every 6 (six) hours. 09/11/19  Yes [provider]  pantoprazole (PROTONIX) 40 MG tablet Take 1 tablet (40 mg total) by mouth 2 (two) times daily. 04/08/18  Yes Charlynne Cousins, MD  clonazePAM (KLONOPIN) 0.5 MG tablet Take 1 tablet (0.5 mg total) by mouth 2 (two) times daily. Patient not taking: Reported on 09/27/2019 04/10/18   Charlynne Cousins, MD  oxyCODONE (OXY IR/ROXICODONE) 5 MG immediate release tablet Take 1 tablet (5 mg total) by mouth every 6 (six) hours as needed for severe pain. Patient not taking: Reported on  09/27/2019 05/19/18   Grace Isaac, MD    Physical Exam: Vitals:   09/27/19 0745 09/27/19 1224 09/27/19 1708 09/27/19 1757  BP: 131/61  134/76 (!) 147/76  Pulse: 87  95 85  Resp: 10  20 15   Temp:   97.7 F (36.5 C) 98.2 F (36.8 C)  TempSrc:   Oral Oral  SpO2: 97%  99% 100%  Weight:  78 kg    Height:  5\' 10"  (1.778 m)       . General:  Appears calm and comfortable and is NAD; very conversant with no conversational dyspnea (had removed all leads, wires, and lines at the time of my evaluation) . Eyes:  PERRL, EOMI, normal lids, iris . ENT:  grossly normal hearing, charcoal staining lips & tongue . Neck:  no LAD, masses or thyromegaly . Cardiovascular:  RRR, no m/r/g. No LE edema.  [name] Respiratory:   CTA bilaterally with no wheezes/rales/rhonchi.  Normal respiratory effort. . Abdomen:  soft, NT, ND, NABS . Skin:  no rash or induration seen on limited exam . Musculoskeletal:  grossly normal tone BUE/BLE, good ROM, no bony abnormality . Lower extremity:  1+ LE edema.  Limited foot exam with no ulcerations but extremely poor foot hygeine.   [name] Psychiatric:  depressed mood and affect, speech fluent and appropriate, AOx3 . Neurologic:  CN 2-12 grossly intact, moves all extremities in coordinated fashion    Radiological Exams on Admission: DG Chest Port 1 View  Result Date: 09/27/2019 CLINICAL DATA:  Suicide attempt, took 36 oxycodone and drink a bottle of wine EXAM: PORTABLE CHEST 1 VIEW COMPARISON:  CT 07/10/2018 FINDINGS: Chronic left pleural effusion resulting in some gradient opacity of the left lung reflecting layering fluid. Chronic scarring and architectural distortion changes in the left upper lung as well as a more masslike opacity likely corresponding to a site of prior malignancy and or treatment related change. Background of chronically coarsened interstitial changes throughout both lungs. No right effusion. No visible pneumothorax. The aorta is calcified. The remaining  cardiomediastinal contours are unremarkable. Stable mild cardiomegaly. No acute osseous or soft tissue abnormality. Degenerative changes are present  in the imaged spine and shoulders. Telemetry leads overlie the chest. IMPRESSION: 1. Chronic left pleural effusion, not significantly changed from prior counting for differences in technique and inflation. 2. Low volumes and atelectasis. 3. Chronic scarring and architectural distortion changes in the left upper lung 4. Masslike opacity likely corresponding to a site of prior malignancy and or treatment related change in the left upper lobe. 5. Additional chronic coarsened interstitial changes. 6. Stable cardiomegaly. 7.  Aortic Atherosclerosis (ICD10-I70.0). Electronically Signed   By: Lovena Le M.D.   On: 09/27/2019 04:40    EKG: Independently reviewed.  NSR with rate 99; nonspecific ST changes with no evidence of acute ischemia   Labs on Admission: I have personally reviewed the available labs and imaging studies at the time of the admission.  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)   Intentional overdose of drug in tablet form ([place])   Alcohol abuse   Tobacco dependence    Acute respiratory failure with hypoxia -Patient presented with intentional overdose that occurred several hours prior to presentation (he woke up hours later and decided to call 911) -He had recurrent hypoxia throughout ER visit -CXR with chronic changes but no acute changes -COVID negative -At the time of my evaluation, he was not dyspneic at all during communication and was not wearing a pulse ox (had  removed all lines, monitors); I planned to medically clear him and allow him to go to psych -Unfortunately, he was reconnected to the monitor and pulse ox was consistently 84% on RA -Possible aspiration PNA, possible chronic hypoxia for which he needs home O2 -For now will observe overnight on telemetry and continuous pulse ox -Continue Zosyn for now -Repeat 2 view CXR in AM  Intentional overdose -Patient with significant social isolation and depression since his wife died -He has poor mobility and does not leave the house -He denies active SI but clearly exhibited passive SI - reports he just wants to go to sleep and not wake up but does not have a plan to kill himself -He was alert upon presentation to the ER since it was many hours later -Tylenol negative -Salicylate negative -UDS pending -Will need sitter -Suicide precautions -Will need psychiatry consult and likely inpatient [place] hospitalization -IVC paperwork completed by EDP  Lung CA -Prior stage 1B SCC of the LUL, treated with radiation therapy in 2018 -Concern for recurrent malignancy but PET was reassuring and so currently undergoing observation therapy  -Plan for f/u in 4-6 months  Chronic pain -I have reviewed this patient in the [place] Controlled Substances Reporting System.  He is receiving medications from only one provider and appears to be taking them as prescribed but appears to be requesting and receiving early refills. -He is at high risk of opioid misuse, diversion, or overdose - as evidenced by his current presentation.  HTN -He does not appear to be taking medications for this issue at this time  AAA -s/p repair in 2018  -Followed by Dr. Servando Snare  ETOH abuse -Patient with chronic ETOH abuse, worsened since his wife died -ETOH level <10 on presentation -He is at increased risk for complications  of withdrawal including seizures, DTs -CIWA protocol  Tobacco dependence -Encourage cessation.    -Patch  ordered   Note: This patient has been tested and is negative for the novel coronavirus COVID-19.  DVT prophylaxis:   Lovenox  Code Status:  Full - given current presentation with depressed mood and SI; however, patient does report desire for a natural death and this should be reconsidered once his mood is not considered to be a factor in his judgment Family Communication: None present Disposition Plan:  The patient is from: home  Anticipated d/c is to: inpatient behavioral health  Anticipated d/c date will depend on clinical response to treatment, but possibly as early as tomorrow if he has excellent response to treatment  Patient is currently: acutely ill Consults called: Psychiatry; PT/OT  Admission status:  It is my clinical opinion that referral for OBSERVATION is reasonable and necessary in this patient based on the above information provided. The aforementioned taken together are felt to place the patient at high risk for further clinical deterioration. However it is anticipated that the patient may be medically stable for discharge from the hospital within 24 to 48 hours.    Karmen Bongo MD [facility]   How to contact the [facility] Attending or Consulting provider [place] or covering provider during after hours [place], for this patient?  1. Check the care team in [facility] and look for a) attending/consulting TR[facility] provider listed and b) the [facility] team listed 2. Log into [email or web address] and use [place]'s universal password to access. If you do not have the password, please contact the hospital operator. 3. Locate the [facility] provider you are looking for under [facility] and page to a number that you can be directly reached. 4. If you still have difficulty reaching the provider, please page the [facility] (Director on Call) for the Hospitalists listed on amion for assistance.   09/27/2019, 6:25 PM

## 2019-09-28 ENCOUNTER — Inpatient Hospital Stay (HOSPITAL_COMMUNITY): Payer: Medicare Other

## 2019-09-28 ENCOUNTER — Observation Stay (HOSPITAL_COMMUNITY): Payer: Medicare Other

## 2019-09-28 DIAGNOSIS — T50902A Poisoning by unspecified drugs, medicaments and biological substances, intentional self-harm, initial encounter: Secondary | ICD-10-CM | POA: Diagnosis not present

## 2019-09-28 DIAGNOSIS — M7989 Other specified soft tissue disorders: Secondary | ICD-10-CM

## 2019-09-28 DIAGNOSIS — J690 Pneumonitis due to inhalation of food and vomit: Secondary | ICD-10-CM | POA: Diagnosis present

## 2019-09-28 DIAGNOSIS — Z599 Problem related to housing and economic circumstances, unspecified: Secondary | ICD-10-CM | POA: Diagnosis not present

## 2019-09-28 DIAGNOSIS — C3432 Malignant neoplasm of lower lobe, left bronchus or lung: Secondary | ICD-10-CM | POA: Diagnosis present

## 2019-09-28 DIAGNOSIS — Z923 Personal history of irradiation: Secondary | ICD-10-CM | POA: Diagnosis not present

## 2019-09-28 DIAGNOSIS — I119 Hypertensive heart disease without heart failure: Secondary | ICD-10-CM | POA: Diagnosis present

## 2019-09-28 DIAGNOSIS — C3412 Malignant neoplasm of upper lobe, left bronchus or lung: Secondary | ICD-10-CM

## 2019-09-28 DIAGNOSIS — T6592XA Toxic effect of unspecified substance, intentional self-harm, initial encounter: Secondary | ICD-10-CM | POA: Diagnosis not present

## 2019-09-28 DIAGNOSIS — K7469 Other cirrhosis of liver: Secondary | ICD-10-CM | POA: Diagnosis present

## 2019-09-28 DIAGNOSIS — R600 Localized edema: Secondary | ICD-10-CM

## 2019-09-28 DIAGNOSIS — R609 Edema, unspecified: Secondary | ICD-10-CM

## 2019-09-28 DIAGNOSIS — Z8551 Personal history of malignant neoplasm of bladder: Secondary | ICD-10-CM | POA: Diagnosis not present

## 2019-09-28 DIAGNOSIS — J90 Pleural effusion, not elsewhere classified: Secondary | ICD-10-CM

## 2019-09-28 DIAGNOSIS — F4310 Post-traumatic stress disorder, unspecified: Secondary | ICD-10-CM | POA: Diagnosis present

## 2019-09-28 DIAGNOSIS — Z20822 Contact with and (suspected) exposure to covid-19: Secondary | ICD-10-CM | POA: Diagnosis present

## 2019-09-28 DIAGNOSIS — F1010 Alcohol abuse, uncomplicated: Secondary | ICD-10-CM

## 2019-09-28 DIAGNOSIS — K7031 Alcoholic cirrhosis of liver with ascites: Secondary | ICD-10-CM | POA: Diagnosis not present

## 2019-09-28 DIAGNOSIS — E876 Hypokalemia: Secondary | ICD-10-CM | POA: Diagnosis present

## 2019-09-28 DIAGNOSIS — Z79891 Long term (current) use of opiate analgesic: Secondary | ICD-10-CM | POA: Diagnosis not present

## 2019-09-28 DIAGNOSIS — G8929 Other chronic pain: Secondary | ICD-10-CM | POA: Diagnosis present

## 2019-09-28 DIAGNOSIS — R52 Pain, unspecified: Secondary | ICD-10-CM | POA: Diagnosis not present

## 2019-09-28 DIAGNOSIS — K219 Gastro-esophageal reflux disease without esophagitis: Secondary | ICD-10-CM | POA: Diagnosis present

## 2019-09-28 DIAGNOSIS — F329 Major depressive disorder, single episode, unspecified: Secondary | ICD-10-CM | POA: Diagnosis present

## 2019-09-28 DIAGNOSIS — I714 Abdominal aortic aneurysm, without rupture: Secondary | ICD-10-CM

## 2019-09-28 DIAGNOSIS — L89151 Pressure ulcer of sacral region, stage 1: Secondary | ICD-10-CM | POA: Diagnosis present

## 2019-09-28 DIAGNOSIS — J9601 Acute respiratory failure with hypoxia: Secondary | ICD-10-CM | POA: Diagnosis present

## 2019-09-28 DIAGNOSIS — J9811 Atelectasis: Secondary | ICD-10-CM | POA: Diagnosis present

## 2019-09-28 DIAGNOSIS — R45851 Suicidal ideations: Secondary | ICD-10-CM | POA: Diagnosis present

## 2019-09-28 DIAGNOSIS — F17210 Nicotine dependence, cigarettes, uncomplicated: Secondary | ICD-10-CM | POA: Diagnosis present

## 2019-09-28 DIAGNOSIS — C7802 Secondary malignant neoplasm of left lung: Secondary | ICD-10-CM | POA: Diagnosis present

## 2019-09-28 DIAGNOSIS — T402X2A Poisoning by other opioids, intentional self-harm, initial encounter: Secondary | ICD-10-CM | POA: Diagnosis present

## 2019-09-28 DIAGNOSIS — T1491XA Suicide attempt, initial encounter: Secondary | ICD-10-CM | POA: Diagnosis not present

## 2019-09-28 DIAGNOSIS — R0902 Hypoxemia: Secondary | ICD-10-CM

## 2019-09-28 LAB — CBC WITH DIFFERENTIAL/PLATELET
Abs Immature Granulocytes: 0.03 10*3/uL (ref 0.00–0.07)
Basophils Absolute: 0 10*3/uL (ref 0.0–0.1)
Basophils Relative: 1 %
Eosinophils Absolute: 0.1 10*3/uL (ref 0.0–0.5)
Eosinophils Relative: 1 %
HCT: 32.1 % — ABNORMAL LOW (ref 39.0–52.0)
Hemoglobin: 9.1 g/dL — ABNORMAL LOW (ref 13.0–17.0)
Immature Granulocytes: 1 %
Lymphocytes Relative: 8 %
Lymphs Abs: 0.5 10*3/uL — ABNORMAL LOW (ref 0.7–4.0)
MCH: 24.2 pg — ABNORMAL LOW (ref 26.0–34.0)
MCHC: 28.3 g/dL — ABNORMAL LOW (ref 30.0–36.0)
MCV: 85.4 fL (ref 80.0–100.0)
Monocytes Absolute: 0.7 10*3/uL (ref 0.1–1.0)
Monocytes Relative: 11 %
Neutro Abs: 5.1 10*3/uL (ref 1.7–7.7)
Neutrophils Relative %: 78 %
Platelets: 258 10*3/uL (ref 150–400)
RBC: 3.76 MIL/uL — ABNORMAL LOW (ref 4.22–5.81)
RDW: 18.3 % — ABNORMAL HIGH (ref 11.5–15.5)
WBC: 6.4 10*3/uL (ref 4.0–10.5)
nRBC: 0 % (ref 0.0–0.2)

## 2019-09-28 LAB — BASIC METABOLIC PANEL
Anion gap: 11 (ref 5–15)
BUN: 15 mg/dL (ref 8–23)
CO2: 29 mmol/L (ref 22–32)
Calcium: 8.4 mg/dL — ABNORMAL LOW (ref 8.9–10.3)
Chloride: 100 mmol/L (ref 98–111)
Creatinine, Ser: 1.17 mg/dL (ref 0.61–1.24)
GFR calc Af Amer: >60 mL/min (ref >60)
GFR calc non Af Amer: >60 mL/min (ref >60)
Glucose, Bld: 88 mg/dL (ref 70–99)
Potassium: 4.1 mmol/L (ref 3.5–5.1)
Sodium: 140 mmol/L (ref 135–145)

## 2019-09-28 LAB — ECHOCARDIOGRAM COMPLETE
AR max vel: 3.73 cm2
AV Area VTI: 3.5 cm2
AV Area mean vel: 3.67 cm2
AV Mean grad: 5 mmHg
AV Peak grad: 8.1 mmHg
Ao pk vel: 1.42 m/s
Area-P 1/2: 3.27 cm2
Height: 70 in
S' Lateral: 2.6 cm
Weight: 3177.6 oz

## 2019-09-28 MED ORDER — IOHEXOL 300 MG/ML  SOLN
75.0000 mL | Freq: Once | INTRAMUSCULAR | Status: AC | PRN
  Administered 2019-09-28: 75 mL via INTRAVENOUS

## 2019-09-28 MED ORDER — PERFLUTREN LIPID MICROSPHERE
1.0000 mL | INTRAVENOUS | Status: AC | PRN
  Administered 2019-09-28: 3 mL via INTRAVENOUS
  Filled 2019-09-28: qty 10

## 2019-09-28 MED ORDER — OXYCODONE HCL 5 MG PO TABS
15.0000 mg | ORAL_TABLET | Freq: Four times a day (QID) | ORAL | Status: DC | PRN
  Administered 2019-09-28 – 2019-09-29 (×4): 15 mg via ORAL
  Filled 2019-09-28 (×4): qty 3

## 2019-09-28 NOTE — Progress Notes (Signed)
  Echocardiogram 2D Echocardiogram has been performed with Definity.  Tyler Mcmillan 09/28/2019, 2:10 PM

## 2019-09-28 NOTE — Progress Notes (Signed)
Patient came to the floor around 1845 with sitter alert and oriented, calm, put pt on the monitor V/S stable SR on the monitor. Report given to night shift RN.

## 2019-09-28 NOTE — Progress Notes (Signed)
Lower extremity venous bilateral study completed.   Please see CV Proc for preliminary results.   Tyler Mcmillan

## 2019-09-28 NOTE — Progress Notes (Signed)
[facility]  PROGRESS NOTE  Key Cen VEL:[id number] DOB: 05-31-1945 DOA: 09/27/2019 PCP: [name], MD Admit date - 09/27/2019   Admitting Physician Rise Patience, MD  Outpatient Primary MD for the patient is [name], MD  LOS - 0 Brief Narrative   Tyler Mcmillan is a 74 y.o. year old male with medical history significant for alcohol abuse, non-small cell left lung cancer status post stereotactic radiotherapy, HTN, AAA repair who presented on 8/19 with intentional overdose of oxycodone for suicide attempt and was found to have acute hypoxic respiratory failure of multifactorial etiology related to opiate overdose, aspiration pneumonia requiring IV Zosyn and chronic left-sided pleural effusion.     Subjective  Today he does have a nonproductive cough.  Denies any fevers or chills.  No chest pain.  Does mention lower leg swelling over the past week  A & P   Acute hypoxic respiratory failure.  In the setting of attempted suicide with opioid overdose likely further complicated by element of aspiration pneumonia in a patient with chronic left loculated pleural effusion.  Repeat chest x-ray shows consolidation at bases concerning for pneumonia, effusion remains stable. -Continue IV Zosyn for aspiration pneumonia -Wean O2 as able, SPO2 greater than 92% -Monitor chronic effusion (in setting of known lung cancer), no current indication for thoracentesis given stable size -Encourage incentive spirometry    Left lobe pneumonia.  Some concern for aspiration pneumonia given overdose with opioid, would expect that to be on the right side.  Patient remains afebrile without white count and maintaining adequate O2 saturation on 3 L -Wean O2 as able -Continue IV Zosyn for now, dissipate de-escalating to oral antibiotics in 24 hours -Monitor CBC  Worsening bilateral lower extremity swelling.  Patient states has been ongoing for a week.  Differential includes DVT  given known malignancy history, CHF also consideration giving slow prodrome over the past week though denies any orthopnea or chest pain -Venous duplex -TTE  Non-small cell lung cancer of left lower lobe (diagnosed 07/2016) with chronic left pleural effusion followed by Dr. [name].  Curative stereotactic radiotherapy to the left lung mass in September 2018.  In 2020 required hospitalization and placement of Pleurx catheter for drainage that was ultimately removed.  Last evaluated by CT surgery (Dr. [name]) on 08/29/2018 we do not recommend resection of lung mass given no evidence of progression of disease on past CT scan -Given current O2 requirements, prior history of effusion that looks stable on XR but not specific, currently being treated for potential pneumonia will get more specific imaging with CT scan of chest  Intentional opioid overdose with suicide attempt Admits to feeling like a burden to his stepson, social isolation. -Once medically stable psych recommends inpatient psych treatment -Currently IVC in place -sitter in place -Continue suicide precautions  History of abdominal aortic aneurysm, repaired with stent graft August 2018  Alcohol abuse Reportedly drank a whole bottle of wine in addition to his opioids.  No current signs or symptoms of withdrawal -Monitor on CIWA protocol -Folic acid, multivitamin, thiamine  Chronic pain Takes oxycodone IR 20 mg every 6 hours, follows with pain management clinic, confirmed only provider for pain management on [place] controlled substance reporting database -Decrease oxycodone IR to 15 mg every 6 hours as needed in light of recent overdose, but this is to help with any pain management related to his malignancy       Family Communication  : None  Code Status : Full code,  discussed on day of admission  Disposition Plan  :  Patient is from home. Anticipated d/c date: 2 to 3 days. Barriers to d/c or necessity for inpatient  status:  Patient was admitted as observation however patient will be changed to inpatient status given patient needs to continue IV Zosyn for aspiration pneumonia, wean O2 as able for acute hypoxic respiratory failure, and further evaluation of left loculated pleural effusion.  Wean O2, currently requiring IV antibiotics, CT chest to assess effusion and any further interventions, close monitoring for alcohol withdrawal Consults  : Psych  Procedures  : None, TTE pending  DVT Prophylaxis  :  Lovenox  Lab Results  Component Value Date   PLT 258 09/28/2019    Diet :  Diet Order            Diet Heart Room service appropriate? Yes; Fluid consistency: Thin  Diet effective now                  Inpatient Medications Scheduled Meds: . docusate sodium  100 mg Oral BID  . enoxaparin (LOVENOX) injection  40 mg Subcutaneous Daily  . folic acid  1 mg Oral Daily  . multivitamin with minerals  1 tablet Oral Daily  . nicotine  14 mg Transdermal Daily  . oxyCODONE  20 mg Oral Q6H  . pantoprazole  40 mg Oral BID  . thiamine  100 mg Oral Daily   Or  . thiamine  100 mg Intravenous Daily   Continuous Infusions: . sodium chloride    . piperacillin-tazobactam (ZOSYN)  IV 3.375 g (09/28/19 0533)   PRN Meds:.acetaminophen **OR** acetaminophen, bisacodyl, clonazePAM, hydrALAZINE, LORazepam **OR** LORazepam, ondansetron **OR** ondansetron (ZOFRAN) IV, polyethylene glycol  Antibiotics  :   Anti-infectives (From admission, onward)   Start     Dose/Rate Route Frequency Ordered Stop   09/27/19 1130  piperacillin-tazobactam (ZOSYN) IVPB 3.375 g        3.375 g 12.5 mL/hr over 240 Minutes Intravenous Every 8 hours 09/27/19 0605     09/27/19 0415  piperacillin-tazobactam (ZOSYN) IVPB 3.375 g        3.375 g 100 mL/hr over 30 Minutes Intravenous  Once 09/27/19 0408 09/27/19 0602       Objective   Vitals:   09/27/19 2336 09/28/19 0258 09/28/19 0317 09/28/19 0746  BP: 140/67 (!) 142/69  133/61    Pulse: 96 89 91 89  Resp: 19  19 18   Temp: 98.6 F (37 C)  98.9 F (37.2 C) 98.9 F (37.2 C)  TempSrc: Oral  Oral Oral  SpO2: 94%  98% 98%  Weight:   90.1 kg   Height:        SpO2: 98 % O2 Flow Rate (L/min): 3 L/min  Wt Readings from Last 3 Encounters:  09/28/19 90.1 kg  05/16/18 73.5 kg  04/17/18 71.2 kg     Intake/Output Summary (Last 24 hours) at 09/28/2019 1251 Last data filed at 09/28/2019 1100 Gross per 24 hour  Intake 870 ml  Output --  Net 870 ml    Physical Exam:     Awake Alert, Oriented X 3, flat No new F.N deficits,  [place].AT, Normal respiratory effort on 3, decreased breath sounds on left, occasional rhonchi, no wheezing,  RRR,No Gallops,Rubs or new Murmurs,  +ve B.Sounds, Abd Soft, No tenderness, No rebound, guarding or rigidity. 1+ pitting edema of bilateral lower extremities to calves   I have personally reviewed the following:   Data Reviewed:  CBC  Recent Labs  Lab 09/27/19 0402 09/28/19 0457  WBC 8.4 6.4  HGB 10.6* 9.1*  HCT 39.1 32.1*  PLT 298 258  MCV 85.7 85.4  MCH 23.2* 24.2*  MCHC 27.1* 28.3*  RDW 18.4* 18.3*  LYMPHSABS 0.4* 0.5*  MONOABS 0.7 0.7  EOSABS 0.0 0.1  BASOSABS 0.0 0.0    Chemistries  Recent Labs  Lab 09/27/19 0402 09/28/19 0457  NA 136 140  K 4.7 4.1  CL 100 100  CO2 26 29  GLUCOSE 113* 88  BUN 11 15  CREATININE 1.13 1.17  CALCIUM 8.7* 8.4*  AST 30  --   ALT 13  --   ALKPHOS 68  --   BILITOT 0.5  --    ------------------------------------------------------------------------------------------------------------------ No results for input(s): CHOL, HDL, LDLCALC, TRIG, CHOLHDL, LDLDIRECT in the last 72 hours.  No results found for: HGBA1C ------------------------------------------------------------------------------------------------------------------ No results for input(s): TSH, T4TOTAL, T3FREE, THYROIDAB in the last 72 hours.  Invalid input(s):  FREET3 ------------------------------------------------------------------------------------------------------------------ No results for input(s): VITAMINB12, FOLATE, FERRITIN, TIBC, IRON, RETICCTPCT in the last 72 hours.  Coagulation profile No results for input(s): INR, PROTIME in the last 168 hours.  No results for input(s): DDIMER in the last 72 hours.  Cardiac Enzymes No results for input(s): CKMB, TROPONINI, MYOGLOBIN in the last 168 hours.  Invalid input(s): CK ------------------------------------------------------------------------------------------------------------------    Component Value Date/Time   BNP 609.5 (H) 09/27/2019 0403    Micro Results Recent Results (from the past 240 hour(s))  SARS Coronavirus 2 by RT PCR (hospital order, performed in [facility] hospital lab) Nasopharyngeal Nasopharyngeal Swab     Status: None   Collection Time: 09/27/19  4:18 AM   Specimen: Nasopharyngeal Swab  Result Value Ref Range Status   SARS Coronavirus 2 NEGATIVE NEGATIVE Final    Comment: (NOTE) SARS-CoV-2 target nucleic acids are NOT DETECTED.  The SARS-CoV-2 RNA is generally detectable in upper and lower respiratory specimens during the acute phase of infection. The lowest concentration of SARS-CoV-2 viral copies this assay can detect is 250 copies / mL. A negative result does not preclude SARS-CoV-2 infection and should not be used as the sole basis for treatment or other patient management decisions.  A negative result may occur with improper specimen collection / handling, submission of specimen other than nasopharyngeal swab, presence of viral mutation(s) within the areas targeted by this assay, and inadequate number of viral copies (<250 copies / mL). A negative result must be combined with clinical observations, patient history, and epidemiological information.  Fact Sheet for Patients:   StrictlyIdeas.no  Fact Sheet for Healthcare  Providers: BankingDealers.co.za  This test is not yet approved or  cleared by the [place] FDA and has been authorized for detection and/or diagnosis of SARS-CoV-2 by FDA under an Emergency Use Authorization (EUA).  This EUA will remain in effect (meaning this test can be used) for the duration of the COVID-19 declaration under Section 564(b)(1) of the Act, 21 U.S.C. section 360bbb-3(b)(1), unless the authorization is terminated or revoked sooner.  Performed at [facility], [place] [address] [place], [place] [address]   Blood culture (routine x 2)     Status: None (Preliminary result)   Collection Time: 09/27/19  5:04 AM   Specimen: BLOOD RIGHT HAND  Result Value Ref Range Status   Specimen Description BLOOD RIGHT HAND  Final   Special Requests   Final    BOTTLES DRAWN AEROBIC AND ANAEROBIC Blood Culture results may not be optimal due to an  inadequate volume of blood received in culture bottles   Culture   Final    NO GROWTH < 24 HOURS Performed at [place] [address] [place], [place] [address]    Report Status PENDING  Incomplete  Blood culture (routine x 2)     Status: None (Preliminary result)   Collection Time: 09/27/19  5:04 AM   Specimen: BLOOD LEFT HAND  Result Value Ref Range Status   Specimen Description BLOOD LEFT HAND  Final   Special Requests   Final    BOTTLES DRAWN AEROBIC AND ANAEROBIC Blood Culture results may not be optimal due to an inadequate volume of blood received in culture bottles   Culture   Final    NO GROWTH < 24 HOURS Performed at [facility], [place] [address], [place], [place] [address]    Report Status PENDING  Incomplete    Radiology Reports DG Chest 2 View  Result Date: 09/28/2019 CLINICAL DATA:  Hypoxia EXAM: CHEST - 2 VIEW COMPARISON:  Chest radiograph 09/27/2019, CT chest 07/10/2018 FINDINGS: When accounting for differences in technique, likely similar loculated left pleural effusion. No  discernible pneumothorax. New left basilar consolidation with silhouetting of the left hemidiaphragm. Similar chronic scarring and architectural distortion, most pronounced in the left upper lobe. Similar left lung volume loss with leftward shift of the mediastinum. Similar mild enlargement the cardiopericardial silhouette. Approximately 2.6 cm central cavitary mass in the left upper lobe, better characterized on prior CT chest from 07/10/2018 and consistent with known primary bronchogenic neoplasm. No acute osseous abnormality. Although suboptimally evaluated, grossly similar appearance of anterior wedging of multiple thoracic vertebral bodies and a chronic L1 compression fracture. IMPRESSION: 1. New left basilar consolidation, concerning for aspiration and/or pneumonia. 2. Loculated left pleural effusion, likely similar when accounting for differences in technique. 3. Redemonstrated left upper lobe mass, consistent with known primary bronchogenic neoplasm.CT chest could better assess for progression, if indicated. Electronically Signed   By: Margaretha Sheffield MD   On: 09/28/2019 08:34   DG Chest Port 1 View  Result Date: 09/27/2019 CLINICAL DATA:  Suicide attempt, took 36 oxycodone and drink a bottle of wine EXAM: PORTABLE CHEST 1 VIEW COMPARISON:  CT 07/10/2018 FINDINGS: Chronic left pleural effusion resulting in some gradient opacity of the left lung reflecting layering fluid. Chronic scarring and architectural distortion changes in the left upper lung as well as a more masslike opacity likely corresponding to a site of prior malignancy and or treatment related change. Background of chronically coarsened interstitial changes throughout both lungs. No right effusion. No visible pneumothorax. The aorta is calcified. The remaining cardiomediastinal contours are unremarkable. Stable mild cardiomegaly. No acute osseous or soft tissue abnormality. Degenerative changes are present in the imaged spine and shoulders.  Telemetry leads overlie the chest. IMPRESSION: 1. Chronic left pleural effusion, not significantly changed from prior counting for differences in technique and inflation. 2. Low volumes and atelectasis. 3. Chronic scarring and architectural distortion changes in the left upper lung 4. Masslike opacity likely corresponding to a site of prior malignancy and or treatment related change in the left upper lobe. 5. Additional chronic coarsened interstitial changes. 6. Stable cardiomegaly. 7.  Aortic Atherosclerosis (ICD10-I70.0). Electronically Signed   By: Lovena Le M.D.   On: 09/27/2019 04:40     Time Spent in minutes  30     [name] M.D on 09/28/2019 at 12:51 PM  To page go to [email or web address] - password [facility]

## 2019-09-28 NOTE — Evaluation (Addendum)
Occupational Therapy Evaluation Patient Details Name: Tyler Mcmillan MRN: [id number] DOB: 1946-01-03 Today's Date: 09/28/2019    History of Present Illness Pt is a 74 y.o. male with PMH significant of SCC of left lung (2018); HTN; anxiety d/o; bladder cancer; and AAA presenting with intentional overdose. Admitted for hypoxic respiratory failure could be from edema versus aspiration. Of note: Wife died 2022/08/14, and was his caregiver and helper.    Clinical Impression   Prior to admission, Pt reports performing sponge baths (fear of stepping over tub edge), struggling with LB ADL, stuck at home since wife died, no longer driving. Pt uses SPC or RW at baseline. Today Pt is able to transfer and mobilize in room at min guard assist (assist for line management and safety with RW). Pt on 3L O2 throughout session, will benefit from skilled OT in the acute setting as well as afterwards (I know he has qualified for inpatient psych). I also feel like after the acute setting OT can play a significant role in helping his access or discover meaningful occupations to improve his quality of life in addition to maximizing independence in ADL and functional transfers. Next session should focus on education and practice with AE for LB ADL.  Also sent a note to MD re: code status/Palliative consult    Follow Up Recommendations  Supervision/Assistance - 24 hour;Home health OT    Equipment Recommendations  3 in 1 bedside commode;Tub/shower bench;Other (comment) (AE for LB ADL)    Recommendations for Other Services Other (comment) (Palliative)     Precautions / Restrictions Precautions Precautions: Fall;Other (comment) (Suicide) Restrictions Weight Bearing Restrictions: No      Mobility Bed Mobility               General bed mobility comments: OOB with PT and in chair at EOS  Transfers Overall transfer level: Needs assistance Equipment used: Rolling walker (2 wheeled) Transfers: Sit  to/from [employer] Transfers Sit to Stand: Min guard;Min assist;From elevated surface Stand pivot transfers: Min guard;Min assist       General transfer comment: assist mainly for safety - line management walker management    Balance Overall balance assessment: Needs assistance Sitting-balance support: Feet supported Sitting balance-Leahy Scale: Fair Sitting balance - Comments: EOB without back support   Standing balance support: Bilateral upper extremity supported;Single extremity supported;During functional activity Standing balance-Leahy Scale: Poor Standing balance comment: dependent on at least one UE support                           ADL either performed or assessed with clinical judgement   ADL Overall ADL's : Needs assistance/impaired Eating/Feeding: Independent   Grooming: Oral care;Wash/dry hands;Standing;Min guard Grooming Details (indicate cue type and reason): decreased activity tolerance for sink level activity Upper Body Bathing: Set up   Lower Body Bathing: Moderate assistance;Sitting/lateral leans Lower Body Bathing Details (indicate cue type and reason): assist to reach feet Upper Body Dressing : Minimal assistance   Lower Body Dressing: Moderate assistance Lower Body Dressing Details (indicate cue type and reason): I dont wear socks and shoes at home Toilet Transfer: Minimal assistance;RW Toilet Transfer Details (indicate cue type and reason): line management and safety awareness Toileting- Clothing Manipulation and Hygiene: Supervision/safety;Sitting/lateral lean     Tub/Shower Transfer Details (indicate cue type and reason): has not been getting in the tub due to fear of falling since wife died Functional mobility during ADLs: Min guard;Rolling walker General ADL  Comments: expresses frustration and difficulty at trouble getting to LB for ADL, fear of falling getting in the tub, pain in BLE (due to edema)     Vision Baseline  Vision/History: Wears glasses Wears Glasses: At all times Patient Visual Report: No change from baseline       Perception     Praxis      Pertinent Vitals/Pain Pain Assessment: Faces Faces Pain Scale: Hurts even more Pain Location: Bil LE Pain Descriptors / Indicators: Discomfort;Tender;Tightness Pain Intervention(s): Monitored during session;Repositioned;Other (comment) (elevation at end of session)     Hand Dominance Left   Extremity/Trunk Assessment Upper Extremity Assessment Upper Extremity Assessment: Overall WFL for tasks assessed;Generalized weakness   Lower Extremity Assessment Lower Extremity Assessment: Defer to PT evaluation (Bil lower edema noted)   Cervical / Trunk Assessment Cervical / Trunk Assessment: Kyphotic   Communication Communication Communication: No difficulties   Cognition Arousal/Alertness: Awake/alert Behavior During Therapy: Flat affect Overall Cognitive Status: Within Functional Limits for tasks assessed                                 General Comments: no mentions of suicidal ideation during session, tearful about wife passing   General Comments       Exercises     Shoulder Instructions      Home Living Family/patient expects to be discharged to:: Private residence Living Arrangements: Spouse/significant other Available Help at Discharge: Family Type of Home: House Home Access: Stairs to enter Technical brewer of Steps: 2 Entrance Stairs-Rails: None Home Layout: One level     Bathroom Shower/Tub: Teacher, early years/pre: Handicapped height Bathroom Accessibility: No   Home Equipment: Environmental consultant - 2 wheels;Cane - single point;Shower seat   Additional Comments: wife had been primary caregiver/driver etc and she died Aug 28, 2022. Per chart review he is "tired of being a burden to people"      Prior Functioning/Environment Level of Independence: Needs assistance  Gait / Transfers Assistance Needed:  uses RW and SPC ADL's / Homemaking Assistance Needed: afraid of falling and has no help since wife died   Comments: no longer driving        OT Problem List: Decreased activity tolerance;Impaired balance (sitting and/or standing);Decreased safety awareness;Decreased knowledge of use of DME or AE;Cardiopulmonary status limiting activity;Pain;Increased edema      OT Treatment/Interventions: Self-care/ADL training;Energy conservation;DME and/or AE instruction;Therapeutic activities;Patient/family education;Balance training    OT Goals(Current goals can be found in the care plan section) Acute Rehab OT Goals Patient Stated Goal: decrease swelling in BLE OT Goal Formulation: With patient Time For Goal Achievement: 10/12/19 Potential to Achieve Goals: Good  OT Frequency: Min 2X/week   Barriers to D/C: Decreased caregiver support          Co-evaluation              AM-PAC OT "6 Clicks" Daily Activity     Outcome Measure Help from another person eating meals?: None Help from another person taking care of personal grooming?: A Little Help from another person toileting, which includes using toliet, bedpan, or urinal?: A Little Help from another person bathing (including washing, rinsing, drying)?: A Lot Help from another person to put on and taking off regular upper body clothing?: A Little Help from another person to put on and taking off regular lower body clothing?: A Lot 6 Click Score: 17   End of Session Equipment Utilized During Treatment:  Gait belt;Rolling walker;Oxygen (3L) Nurse Communication: Mobility status;Precautions  Activity Tolerance: Patient tolerated treatment well Patient left: in chair;with call bell/phone within reach;with nursing/sitter in room;with chair alarm set  OT Visit Diagnosis: Unsteadiness on feet (R26.81);Muscle weakness (generalized) (M62.81)                Time: 7670-1100 OT Time Calculation (min): 15 min Charges:  OT General Charges $OT  Visit: 1 Visit OT Evaluation $OT Eval Moderate Complexity: [place] OTR/L Acute Rehabilitation Services Pager: [phone] Office: [place] 09/28/2019, 11:43 AM

## 2019-09-28 NOTE — Evaluation (Signed)
Physical Therapy Evaluation Patient Details Name: Tyler Mcmillan MRN: [id number] DOB: September 20, 1945 Today's Date: 09/28/2019   History of Present Illness  Pt is a 74 y.o. male with PMH significant of SCC of left lung (2018); HTN; anxiety d/o; bladder cancer; and AAA presenting with intentional overdose. Admitted for hypoxic respiratory failure could be from edema versus aspiration. Of note: Wife died Jul 30, 2022, and was his caregiver and helper.   Clinical Impression  Pt admitted with above diagnosis. Pt was able to ambulate with RW with min guard assist and cues. Pt should progress and be able to get around with RW at [facility].  Will follo wacutely.  Pt currently with functional limitations due to the deficits listed below (see PT Problem List). Pt will benefit from skilled PT to increase their independence and safety with mobility to allow discharge to the venue listed below.      Follow Up Recommendations Home health PT (Behavioral health is the plan)    Equipment Recommendations  None recommended by PT    Recommendations for Other Services       Precautions / Restrictions Precautions Precautions: Fall;Other (comment) (Suicide) Restrictions Weight Bearing Restrictions: No      Mobility  Bed Mobility Overal bed mobility: Needs Assistance Bed Mobility: Supine to Sit     Supine to sit: Min assist     General bed mobility comments: a little assist to come to eOB  Transfers Overall transfer level: Needs assistance Equipment used: Rolling walker (2 wheeled) Transfers: Sit to/from [facility] Sit to Stand: Min guard;Min assist;From elevated surface         General transfer comment: assist mainly for safety - line management walker management. Also needs cues for hand placement  Ambulation/Gait Ambulation/Gait assistance: Min guard Gait Distance (Feet): 125 Feet Assistive device: Rolling walker (2 wheeled) Gait Pattern/deviations:  Step-through pattern;Decreased stride length;Trunk flexed;Wide base of support   Gait velocity interpretation: <1.31 ft/sec, indicative of household ambulator General Gait Details: Pt was able to ambulate with RW wtih min guard assist progressing to hallway.  Pt did need 3LO2 as he desat to 83% at rest.  Pt sats with activity were 92% with 3LO2.  OT came in during session and pt stopped off at sink and OT took over at that point.   Stairs            Wheelchair Mobility    Modified Rankin (Stroke Patients Only)       Balance Overall balance assessment: Needs assistance Sitting-balance support: Feet supported Sitting balance-Leahy Scale: Fair Sitting balance - Comments: EOB without back support   Standing balance support: Bilateral upper extremity supported;Single extremity supported;During functional activity Standing balance-Leahy Scale: Poor Standing balance comment: dependent on at least one UE support                             Pertinent Vitals/Pain Pain Assessment: Faces Faces Pain Scale: Hurts even more Pain Location: Bil LE Pain Descriptors / Indicators: Discomfort;Tender;Tightness Pain Intervention(s): Limited activity within patient's tolerance;Monitored during session;Repositioned    Home Living Family/patient expects to be discharged to:: Private residence Living Arrangements: Spouse/significant other Available Help at Discharge: Family Type of Home: House Home Access: Stairs to enter Entrance Stairs-Rails: None Entrance Stairs-Number of Steps: 2 Home Layout: One level Home Equipment: Environmental consultant - 2 wheels;Cane - single point;Shower seat Additional Comments: wife had been primary caregiver/driver etc and she died 07-30-22. Per chart review  he is "tired of being a burden to people"    Prior Function Level of Independence: Needs assistance   Gait / Transfers Assistance Needed: uses RW and SPC  ADL's / Homemaking Assistance Needed: afraid of  falling and has no help since wife died  Comments: no longer driving     Hand Dominance   Dominant Hand: Left    Extremity/Trunk Assessment   Upper Extremity Assessment Upper Extremity Assessment: Defer to OT evaluation    Lower Extremity Assessment Lower Extremity Assessment: Generalized weakness    Cervical / Trunk Assessment Cervical / Trunk Assessment: Kyphotic  Communication   Communication: No difficulties  Cognition Arousal/Alertness: Awake/alert Behavior During Therapy: Flat affect Overall Cognitive Status: Within Functional Limits for tasks assessed                                 General Comments: no mentions of suicidal ideation during session, tearful about wife passing      General Comments      Exercises     Assessment/Plan    PT Assessment Patient needs continued PT services  PT Problem List Decreased activity tolerance;Decreased balance;Decreased mobility;Decreased knowledge of use of DME;Decreased safety awareness;Decreased knowledge of precautions       PT Treatment Interventions DME instruction;Gait training;Functional mobility training;Therapeutic activities;Therapeutic exercise;Balance training;Patient/family education;Stair training    PT Goals (Current goals can be found in the Care Plan section)  Acute Rehab PT Goals Patient Stated Goal: decrease swelling in BLE PT Goal Formulation: With patient Time For Goal Achievement: 10/12/19 Potential to Achieve Goals: Good    Frequency Min 3X/week   Barriers to discharge Decreased caregiver support      Co-evaluation               AM-PAC PT "6 Clicks" Mobility  Outcome Measure Help needed turning from your back to your side while in a flat bed without using bedrails?: A Little Help needed moving from lying on your back to sitting on the side of a flat bed without using bedrails?: A Little Help needed moving to and from a bed to a chair (including a wheelchair)?: A  Little Help needed standing up from a chair using your arms (e.g., wheelchair or bedside chair)?: A Little Help needed to walk in hospital room?: A Little Help needed climbing 3-5 steps with a railing? : A Little 6 Click Score: 18    End of Session Equipment Utilized During Treatment: Gait belt;Oxygen Activity Tolerance: Patient limited by fatigue Patient left: Other (comment) (standing at sink with OT) Nurse Communication: Mobility status PT Visit Diagnosis: Muscle weakness (generalized) (M62.81)    Time: 8546-2703 PT Time Calculation (min) (ACUTE ONLY): 14 min   Charges:   PT Evaluation $PT Eval Moderate Complexity: 1 Mod          Tyler Mcmillan,PT Acute Rehabilitation Services Pager:  [phone]  Office:  [phone]    Denice Paradise 09/28/2019, 2:19 PM

## 2019-09-29 LAB — RAPID URINE DRUG SCREEN, HOSP PERFORMED
Amphetamines: NOT DETECTED
Barbiturates: NOT DETECTED
Benzodiazepines: NOT DETECTED
Cocaine: NOT DETECTED
Opiates: POSITIVE — AB
Tetrahydrocannabinol: NOT DETECTED

## 2019-09-29 LAB — CBC WITH DIFFERENTIAL/PLATELET
Abs Immature Granulocytes: 0.02 10*3/uL (ref 0.00–0.07)
Basophils Absolute: 0 10*3/uL (ref 0.0–0.1)
Basophils Relative: 1 %
Eosinophils Absolute: 0.1 10*3/uL (ref 0.0–0.5)
Eosinophils Relative: 1 %
HCT: 31.8 % — ABNORMAL LOW (ref 39.0–52.0)
Hemoglobin: 8.9 g/dL — ABNORMAL LOW (ref 13.0–17.0)
Immature Granulocytes: 0 %
Lymphocytes Relative: 8 %
Lymphs Abs: 0.5 10*3/uL — ABNORMAL LOW (ref 0.7–4.0)
MCH: 23.4 pg — ABNORMAL LOW (ref 26.0–34.0)
MCHC: 28 g/dL — ABNORMAL LOW (ref 30.0–36.0)
MCV: 83.7 fL (ref 80.0–100.0)
Monocytes Absolute: 0.7 10*3/uL (ref 0.1–1.0)
Monocytes Relative: 10 %
Neutro Abs: 5.3 10*3/uL (ref 1.7–7.7)
Neutrophils Relative %: 80 %
Platelets: 250 10*3/uL (ref 150–400)
RBC: 3.8 MIL/uL — ABNORMAL LOW (ref 4.22–5.81)
RDW: 18 % — ABNORMAL HIGH (ref 11.5–15.5)
WBC: 6.5 10*3/uL (ref 4.0–10.5)
nRBC: 0 % (ref 0.0–0.2)

## 2019-09-29 LAB — HEPATITIS C ANTIBODY: HCV Ab: NONREACTIVE

## 2019-09-29 LAB — BASIC METABOLIC PANEL
Anion gap: 8 (ref 5–15)
BUN: 12 mg/dL (ref 8–23)
CO2: 30 mmol/L (ref 22–32)
Calcium: 8.3 mg/dL — ABNORMAL LOW (ref 8.9–10.3)
Chloride: 101 mmol/L (ref 98–111)
Creatinine, Ser: 0.97 mg/dL (ref 0.61–1.24)
GFR calc Af Amer: >60 mL/min (ref >60)
GFR calc non Af Amer: >60 mL/min (ref >60)
Glucose, Bld: 100 mg/dL — ABNORMAL HIGH (ref 70–99)
Potassium: 4 mmol/L (ref 3.5–5.1)
Sodium: 139 mmol/L (ref 135–145)

## 2019-09-29 LAB — PROTIME-INR
INR: 1.1 (ref 0.8–1.2)
Prothrombin Time: 14.1 seconds (ref 11.4–15.2)

## 2019-09-29 MED ORDER — OXYCODONE HCL 5 MG PO TABS: 15.0000 mg | ORAL_TABLET | Freq: Four times a day (QID) | ORAL | Status: DC | PRN

## 2019-09-29 MED ORDER — GUAIFENESIN 100 MG/5ML PO SOLN
5.0000 mL | ORAL | Status: DC | PRN
  Administered 2019-09-29 (×3): 100 mg via ORAL
  Filled 2019-09-29 (×4): qty 5

## 2019-09-29 MED ORDER — AMOXICILLIN-POT CLAVULANATE 875-125 MG PO TABS
1.0000 | ORAL_TABLET | Freq: Two times a day (BID) | ORAL | Status: DC
  Administered 2019-09-29 – 2019-10-03 (×9): 1 via ORAL
  Filled 2019-09-29 (×9): qty 1

## 2019-09-29 MED ORDER — OXYCODONE HCL 5 MG PO TABS
15.0000 mg | ORAL_TABLET | ORAL | Status: DC | PRN
  Administered 2019-09-29 – 2019-10-01 (×10): 15 mg via ORAL
  Administered 2019-10-01: 10 mg via ORAL
  Administered 2019-10-01 – 2019-10-13 (×65): 15 mg via ORAL
  Filled 2019-09-29 (×77): qty 3

## 2019-09-29 MED ORDER — OXYCODONE HCL 5 MG PO TABS
15.0000 mg | ORAL_TABLET | ORAL | Status: DC | PRN
Start: 2019-09-29 — End: 2019-09-29

## 2019-09-29 NOTE — Progress Notes (Signed)
[facility]  PROGRESS NOTE  [name] VOH:[id number] DOB: 30-Jun-1945 DOA: 09/27/2019 PCP: [name], MD Admit date - 09/27/2019   Admitting Physician [name], MD  Outpatient Primary MD for the patient is [name], MD  LOS - 1 Brief Narrative   Tyler Mcmillan is a 74 y.o. year old male with medical history significant for alcohol abuse, non-small cell left lung cancer status post stereotactic radiotherapy, HTN, AAA repair who presented on 8/19 with intentional overdose of oxycodone for suicide attempt and was found to have acute hypoxic respiratory failure of multifactorial etiology related to opiate overdose, aspiration pneumonia requiring IV Zosyn and chronic left-sided pleural effusion.     Subjective  Still with cough, nonproductive. No chest pain. States sob with exertion  A & P   Acute hypoxic respiratory failure, stable.  In the setting of attempted suicide with opioid overdose likely further complicated by element of aspiration pneumonia in a patient with chronic left loculated pleural effusion.  Repeat chest x-ray shows consolidation at bases concerning for pneumonia, effusion remains stable on CT chest imaging also shows atelectasis. CT scan also mentions background of emphysema, but no current wheezing -dc zosyn for augmentin -Wean O2 as able, SPO2 greater than 92%, ambulatory O2 testing -Monitor chronic effusion (in setting of known lung cancer), no current indication for thoracentesis given stable size -Encourage incentive spirometry   Bibasilar pneumonia, likely aspiration pneumonia.  CT chest shows bibasilar disease. Likely combination of aspiration pneumonia and atlectasis. Some concern for aspiration pneumonia given overdose with opioid, would expect that to be on the right side.  Patient remains afebrile without white count and maintaining adequate O2 saturation on 3 L.  -Wean O2 as able -Cde-escalating to zosyn --Encourage  incentive spirometry -Monitor CBC  Bilateral lower extremity swelling.  Patient states has been ongoing for a week, nonpitting. TTE and venous duplex negative. Likely dependent edema --supportive care with elevation of legs encouraged  Non-small cell lung cancer of left lower lobe (diagnosed 07/2016) with chronic left pleural effusion followed by Dr. [name].  Curative stereotactic radiotherapy to the left lung mass in September 2018.  In 2020 required hospitalization and placement of Pleurx catheter for drainage that was ultimately removed.  Last evaluated by CT surgery (Dr. [name]) on 08/29/2018 we do not recommend resection of lung mass given no evidence of progression of disease on past CT scan -CT chest shows stable lung mass and chronic effusion with only small right sided pleural effusion --monitor, wean o2   Intentional opioid overdose with suicide attempt Admits to feeling like a burden to his stepson, social isolation. -Once medically stable psych recommends inpatient psych treatment -Currently IVC in place -sitter in place -Continue suicide precautions  History of abdominal aortic aneurysm, repaired with stent graft August 2018  Alcohol abuse Reportedly drank a whole bottle of wine in addition to his opioids.  No current signs or symptoms of withdrawal -Monitor on CIWA protocol -Folic acid, multivitamin, thiamine  Chronic pain Takes oxycodone IR 20 mg every 6 hours, follows with pain management clinic, confirmed only provider for pain management on [place] controlled substance reporting database -Decrease oxycodone IR to 15 mg every 4 hours as needed in light of recent overdose, but this is to help with any pain management related to his malignancy  Cirrhosis. Mentioned on CT chest. Patient does admit to alcohol abuse --check INR --check HCV        Family Communication  : None  Code Status :  Full code, discussed on day of admission  Disposition Plan  :   Patient is from home. Anticipated d/c date: 1 to 2 days. Barriers to d/c or necessity for inpatient status:  wean O2 as able for acute hypoxic respiratory failure, a, close monitoring for alcohol withdrawal Consults  : Psych  Procedures  : TTE, venous duplex  DVT Prophylaxis  :  Lovenox  Lab Results  Component Value Date   PLT 250 09/29/2019    Diet :  Diet Order            Diet Heart Room service appropriate? Yes; Fluid consistency: Thin  Diet effective now                  Inpatient Medications Scheduled Meds: . amoxicillin-clavulanate  1 tablet Oral Q12H  . docusate sodium  100 mg Oral BID  . enoxaparin (LOVENOX) injection  40 mg Subcutaneous Daily  . folic acid  1 mg Oral Daily  . multivitamin with minerals  1 tablet Oral Daily  . nicotine  14 mg Transdermal Daily  . pantoprazole  40 mg Oral BID  . thiamine  100 mg Oral Daily   Or  . thiamine  100 mg Intravenous Daily   Continuous Infusions:  PRN Meds:.acetaminophen **OR** acetaminophen, bisacodyl, clonazePAM, hydrALAZINE, LORazepam **OR** LORazepam, ondansetron **OR** ondansetron (ZOFRAN) IV, oxyCODONE, polyethylene glycol  Antibiotics  :   Anti-infectives (From admission, onward)   Start     Dose/Rate Route Frequency Ordered Stop   09/29/19 1215  amoxicillin-clavulanate (AUGMENTIN) 875-125 MG per tablet 1 tablet        1 tablet Oral Every 12 hours 09/29/19 1123 10/04/19 0959   09/27/19 1130  piperacillin-tazobactam (ZOSYN) IVPB 3.375 g  Status:  Discontinued        3.375 g 12.5 mL/hr over 240 Minutes Intravenous Every 8 hours 09/27/19 0605 09/29/19 1123   09/27/19 0415  piperacillin-tazobactam (ZOSYN) IVPB 3.375 g        3.375 g 100 mL/hr over 30 Minutes Intravenous  Once 09/27/19 0408 09/27/19 0602       Objective   Vitals:   09/29/19 0000 09/29/19 0200 09/29/19 0330 09/29/19 0800  BP: (!) 145/68  139/65 (!) 140/96  Pulse: 85  90 83  Resp:      Temp: 98.6 F (37 C)  99 F (37.2 C) 99.1 F (37.3 C)   TempSrc: Oral  Oral Oral  SpO2: 98%  99% 100%  Weight:  88.9 kg    Height:        SpO2: 100 % O2 Flow Rate (L/min): 3 L/min  Wt Readings from Last 3 Encounters:  09/29/19 88.9 kg  05/16/18 73.5 kg  04/17/18 71.2 kg     Intake/Output Summary (Last 24 hours) at 09/29/2019 1123 Last data filed at 09/29/2019 1030 Gross per 24 hour  Intake 360 ml  Output 2 ml  Net 358 ml    Physical Exam:     Awake Alert, Oriented X 3, flat No new F.N deficits,  [place].AT, Normal respiratory effort on 3L [place], decreased breath sounds on left, occasional rhonchi, no wheezing,  RRR,No Gallops,Rubs or new Murmurs,  +ve B.Sounds, Abd Soft, No tenderness, No rebound, guarding or rigidity. Non-pitting edema of bilateral lower extremities to ankles   I have personally reviewed the following:   Data Reviewed:  CBC Recent Labs  Lab 09/27/19 0402 09/28/19 0457 09/29/19 0235  WBC 8.4 6.4 6.5  HGB 10.6* 9.1* 8.9*  HCT 39.1 32.1*  31.8*  PLT 298 258 250  MCV 85.7 85.4 83.7  MCH 23.2* 24.2* 23.4*  MCHC 27.1* 28.3* 28.0*  RDW 18.4* 18.3* 18.0*  LYMPHSABS 0.4* 0.5* 0.5*  MONOABS 0.7 0.7 0.7  EOSABS 0.0 0.1 0.1  BASOSABS 0.0 0.0 0.0    Chemistries  Recent Labs  Lab 09/27/19 0402 09/28/19 0457 09/29/19 0235  NA 136 140 139  K 4.7 4.1 4.0  CL 100 100 101  CO2 26 29 30   GLUCOSE 113* 88 100*  BUN 11 15 12   CREATININE 1.13 1.17 0.97  CALCIUM 8.7* 8.4* 8.3*  AST 30  --   --   ALT 13  --   --   ALKPHOS 68  --   --   BILITOT 0.5  --   --    ------------------------------------------------------------------------------------------------------------------ No results for input(s): CHOL, HDL, LDLCALC, TRIG, CHOLHDL, LDLDIRECT in the last 72 hours.  No results found for: HGBA1C ------------------------------------------------------------------------------------------------------------------ No results for input(s): TSH, T4TOTAL, T3FREE, THYROIDAB in the last 72 hours.  Invalid input(s):  FREET3 ------------------------------------------------------------------------------------------------------------------ No results for input(s): VITAMINB12, FOLATE, FERRITIN, TIBC, IRON, RETICCTPCT in the last 72 hours.  Coagulation profile No results for input(s): INR, PROTIME in the last 168 hours.  No results for input(s): DDIMER in the last 72 hours.  Cardiac Enzymes No results for input(s): CKMB, TROPONINI, MYOGLOBIN in the last 168 hours.  Invalid input(s): CK ------------------------------------------------------------------------------------------------------------------    Component Value Date/Time   BNP 609.5 (H) 09/27/2019 0403    Micro Results Recent Results (from the past 240 hour(s))  SARS Coronavirus 2 by RT PCR (hospital order, performed in [facility] hospital lab) Nasopharyngeal Nasopharyngeal Swab     Status: None   Collection Time: 09/27/19  4:18 AM   Specimen: Nasopharyngeal Swab  Result Value Ref Range Status   SARS Coronavirus 2 NEGATIVE NEGATIVE Final    Comment: (NOTE) SARS-CoV-2 target nucleic acids are NOT DETECTED.  The SARS-CoV-2 RNA is generally detectable in upper and lower respiratory specimens during the acute phase of infection. The lowest concentration of SARS-CoV-2 viral copies this assay can detect is 250 copies / mL. A negative result does not preclude SARS-CoV-2 infection and should not be used as the sole basis for treatment or other patient management decisions.  A negative result may occur with improper specimen collection / handling, submission of specimen other than nasopharyngeal swab, presence of viral mutation(s) within the areas targeted by this assay, and inadequate number of viral copies (<250 copies / mL). A negative result must be combined with clinical observations, patient history, and epidemiological information.  Fact Sheet for Patients:   StrictlyIdeas.no  Fact Sheet for Healthcare  Providers: BankingDealers.co.za  This test is not yet approved or  cleared by the [place] FDA and has been authorized for detection and/or diagnosis of SARS-CoV-2 by FDA under an Emergency Use Authorization (EUA).  This EUA will remain in effect (meaning this test can be used) for the duration of the COVID-19 declaration under Section 564(b)(1) of the Act, 21 U.S.C. section 360bbb-3(b)(1), unless the authorization is terminated or revoked sooner.  Performed at [facility], [place] [address], [place], [place] [address]   Blood culture (routine x 2)     Status: None (Preliminary result)   Collection Time: 09/27/19  5:04 AM   Specimen: BLOOD RIGHT HAND  Result Value Ref Range Status   Specimen Description BLOOD RIGHT HAND  Final   Special Requests   Final    BOTTLES DRAWN AEROBIC  AND ANAEROBIC Blood Culture results may not be optimal due to an inadequate volume of blood received in culture bottles   Culture   Final    NO GROWTH < 24 HOURS Performed at [facility] [address], [place], [place] [address]    Report Status PENDING  Incomplete  Blood culture (routine x 2)     Status: None (Preliminary result)   Collection Time: 09/27/19  5:04 AM   Specimen: BLOOD LEFT HAND  Result Value Ref Range Status   Specimen Description BLOOD LEFT HAND  Final   Special Requests   Final    BOTTLES DRAWN AEROBIC AND ANAEROBIC Blood Culture results may not be optimal due to an inadequate volume of blood received in culture bottles   Culture   Final    NO GROWTH < 24 HOURS Performed at [facility], [place] [address] [place], [place] [address]    Report Status PENDING  Incomplete    Radiology Reports DG Chest 2 View  Result Date: 09/28/2019 CLINICAL DATA:  Hypoxia EXAM: CHEST - 2 VIEW COMPARISON:  Chest radiograph 09/27/2019, CT chest 07/10/2018 FINDINGS: When accounting for differences in technique, likely similar loculated left pleural effusion. No  discernible pneumothorax. New left basilar consolidation with silhouetting of the left hemidiaphragm. Similar chronic scarring and architectural distortion, most pronounced in the left upper lobe. Similar left lung volume loss with leftward shift of the mediastinum. Similar mild enlargement the cardiopericardial silhouette. Approximately 2.6 cm central cavitary mass in the left upper lobe, better characterized on prior CT chest from 07/10/2018 and consistent with known primary bronchogenic neoplasm. No acute osseous abnormality. Although suboptimally evaluated, grossly similar appearance of anterior wedging of multiple thoracic vertebral bodies and a chronic L1 compression fracture. IMPRESSION: 1. New left basilar consolidation, concerning for aspiration and/or pneumonia. 2. Loculated left pleural effusion, likely similar when accounting for differences in technique. 3. Redemonstrated left upper lobe mass, consistent with known primary bronchogenic neoplasm.CT chest could better assess for progression, if indicated. Electronically Signed   By: Margaretha Sheffield MD   On: 09/28/2019 08:34   CT CHEST W CONTRAST  Result Date: 09/28/2019 CLINICAL DATA:  74 year old male with history of non-small cell lung cancer and pleural effusion. EXAM: CT CHEST WITH CONTRAST TECHNIQUE: Multidetector CT imaging of the chest was performed during intravenous contrast administration. CONTRAST:  2mL OMNIPAQUE IOHEXOL 300 MG/ML  SOLN COMPARISON:  Chest CT dated 07/10/2018. FINDINGS: Cardiovascular: There is no cardiomegaly or pericardial effusion. There is advanced 3 vessel coronary vascular calcification. There is severe atherosclerotic calcification of the thoracic aorta. No aneurysmal dilatation or dissection. Evaluation of the pulmonary arteries is limited due to suboptimal opacification of the peripheral branches. No central pulmonary artery embolus identified. Mediastinum/Nodes: No hilar adenopathy. Top-normal subcarinal lymph  node measures 10 mm in short axis. The esophagus is grossly unremarkable. No mediastinal fluid collection. Lungs/Pleura: There is a 2.6 x 3.6 cm left upper lobe mass similar to prior CT in keeping with known malignancy. Areas of linear and reticular densities adjacent to the mass likely scarring and related to post treatment changes. A 2.1 x 1.4 cm nodular density along the left mediastinal pleural (49/3) likely scarring. Overall there is volume loss in the left upper lobe. There is background of emphysema. There are small bilateral pleural effusions. The right pleural effusion is new since the prior CT. There are bibasilar compressive atelectasis or pneumonia. There is no pneumothorax. The central airways are patent. Upper Abdomen: Cirrhosis. Small  perihepatic free fluid. 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 Musculoskeletal: Osteopenia with degenerative changes of the spine. L1 compression fracture, seen previously. No acute osseous pathology. IMPRESSION: 1. No CT evidence of central pulmonary artery embolus. 2. No significant interval change in the size of left upper lobe mass/malignancy and surrounding post radiation changes/scarring. 3. Small bilateral pleural effusions. The right pleural effusion is new since the prior CT. 4. Bibasilar compressive atelectasis or pneumonia. 5. Cirrhosis with small perihepatic free fluid. 6. Aortic Atherosclerosis (ICD10-I70.0) and Emphysema (ICD10-J43.9). Electronically Signed   By: Anner Crete M.D.   On: 09/28/2019 22:30   DG Chest Port 1 View  Result Date: 09/27/2019 CLINICAL DATA:  Suicide attempt, took 87 oxycodone and drink a bottle of wine EXAM: PORTABLE CHEST 1 VIEW COMPARISON:  CT 07/10/2018 FINDINGS: Chronic left pleural effusion resulting in some gradient opacity of the left lung reflecting layering fluid. Chronic scarring and architectural distortion changes in the left upper lung as well as a  more masslike opacity likely corresponding to a site of prior malignancy and or treatment related change. Background of chronically coarsened interstitial changes throughout both lungs. No right effusion. No visible pneumothorax. The aorta is calcified. The remaining cardiomediastinal contours are unremarkable. Stable mild cardiomegaly. No acute osseous or soft tissue abnormality. Degenerative changes are present in the imaged spine and shoulders. Telemetry leads overlie the chest. IMPRESSION: 1. Chronic left pleural effusion, not significantly changed from prior counting for differences in technique and inflation. 2. Low volumes and atelectasis. 3. Chronic scarring and architectural distortion changes in the left upper lung 4. Masslike opacity likely corresponding to a site of prior malignancy and or treatment related change in the left upper lobe. 5. Additional chronic coarsened interstitial changes. 6. Stable cardiomegaly. 7.  Aortic Atherosclerosis (ICD10-I70.0). Electronically Signed   By: Lovena Le M.D.   On: 09/27/2019 04:40   ECHOCARDIOGRAM COMPLETE  Result Date: 09/28/2019    ECHOCARDIOGRAM REPORT   Patient Name:   Tyler Mcmillan Date of Exam: 09/28/2019 Medical Rec #:  [id number]             Height:       70.0 in Accession #:    [id number]            Weight:       198.6 lb Date of Birth:  [birth year]             BSA:          2.081 m Patient Age:    13 years              BP:           133/61 mmHg Patient Gender: M                     HR:           89 bpm. Exam Location:  Inpatient Procedure: 2D Echo, Cardiac Doppler, Color Doppler and Intracardiac            Opacification Agent Indications:    Peripheral edema  History:        Patient has prior history of Echocardiogram examinations, most                 recent 04/03/2018. Risk Factors:Hypertension and Current Smoker.                 Acute respiratory falure with hypoxia. ETOH abuse.  Sonographer:  Clayton Lefort RDCS (AE) Referring Phys: [id number]  [place]  1. Left ventricular ejection fraction, by estimation, is 60 to 65%. The left ventricle has normal function. The left ventricle has no regional wall motion abnormalities. There is severe asymmetric left ventricular hypertrophy of the basal-septal and mild concentric LVH. Left ventricular diastolic parameters were normal.  2. Right ventricular systolic function is normal. The right ventricular size is normal. Tricuspid regurgitation signal is inadequate for assessing PA pressure.  3. The mitral valve is grossly normal. Trivial mitral valve regurgitation. No evidence of mitral stenosis.  4. The aortic valve is abnormal. Aortic valve regurgitation is not visualized. Mild aortic valve sclerosis is present, with no evidence of aortic valve stenosis.  5. The inferior vena cava is dilated in size with <50% respiratory variability, suggesting right atrial pressure of 15 mmHg.  6. Aortic dilatation noted. There is borderline dilatation of the aortic root measuring 39 mm. FINDINGS  Left Ventricle: Left ventricular ejection fraction, by estimation, is 60 to 65%. The left ventricle has normal function. The left ventricle has no regional wall motion abnormalities. Definity contrast agent was given IV to delineate the left ventricular  endocardial borders. The left ventricular internal cavity size was normal in size. There is severe asymmetric left ventricular hypertrophy of the basal-septal and mild concentric LVH segments. Left ventricular diastolic parameters were normal. Right Ventricle: The right ventricular size is normal. No increase in right ventricular wall thickness. Right ventricular systolic function is normal. Tricuspid regurgitation signal is inadequate for assessing PA pressure. Left Atrium: Left atrial size was normal in size. Right Atrium: Right atrial size was normal in size. Pericardium: There is no evidence of pericardial effusion. Mitral Valve: The mitral valve is grossly normal.  Normal mobility of the mitral valve leaflets. Trivial mitral valve regurgitation. No evidence of mitral valve stenosis. MV peak gradient, 6.6 mmHg. The mean mitral valve gradient is 3.0 mmHg with average heart rate of 88 bpm. Tricuspid Valve: The tricuspid valve is grossly normal. Tricuspid valve regurgitation is trivial. No evidence of tricuspid stenosis. Aortic Valve: The aortic valve is abnormal. Aortic valve regurgitation is not visualized. Mild aortic valve sclerosis is present, with no evidence of aortic valve stenosis. There is mild calcification of the aortic valve. Aortic valve mean gradient measures 5.0 mmHg. Aortic valve peak gradient measures 8.1 mmHg. Aortic valve area, by VTI measures 3.50 cm. Pulmonic Valve: The pulmonic valve was not well visualized. Pulmonic valve regurgitation is not visualized. No evidence of pulmonic stenosis. Aorta: Aortic dilatation noted. There is borderline dilatation of the aortic root measuring 39 mm. Venous: The inferior vena cava is dilated in size with less than 50% respiratory variability, suggesting right atrial pressure of 15 mmHg. IAS/Shunts: No atrial level shunt detected by color flow Doppler.  LEFT VENTRICLE PLAX 2D LVIDd:         4.00 cm  Diastology LVIDs:         2.60 cm  LV e' lateral:   8.38 cm/s LV PW:         1.50 cm  LV E/e' lateral: 12.9 LV IVS:        2.10 cm  LV e' medial:    7.62 cm/s LVOT diam:     2.40 cm  LV E/e' medial:  14.2 LV SV:         105 LV SV Index:   51 LVOT Area:     4.52 cm  RIGHT VENTRICLE  IVC RV Basal diam:  4.70 cm     IVC diam: 2.40 cm RV Mid diam:    4.30 cm RV S prime:     12.60 cm/s TAPSE (M-mode): 2.0 cm LEFT ATRIUM              Index       RIGHT ATRIUM           Index LA diam:        4.00 cm  1.92 cm/m  RA Area:     22.20 cm LA Vol (A2C):   101.0 ml 48.53 ml/m RA Volume:   63.90 ml  30.70 ml/m LA Vol (A4C):   34.9 ml  16.77 ml/m LA Biplane Vol: 60.2 ml  28.93 ml/m  AORTIC VALVE AV Area (Vmax):    3.73 cm AV  Area (Vmean):   3.67 cm AV Area (VTI):     3.50 cm AV Vmax:           142.00 cm/s AV Vmean:          107.000 cm/s AV VTI:            0.301 m AV Peak Grad:      8.1 mmHg AV Mean Grad:      5.0 mmHg LVOT Vmax:         117.00 cm/s LVOT Vmean:        86.800 cm/s LVOT VTI:          0.233 m LVOT/AV VTI ratio: 0.77  AORTA Ao Root diam: 3.90 cm Ao Asc diam:  3.80 cm MITRAL VALVE MV Area (PHT): 3.27 cm     SHUNTS MV Peak grad:  6.6 mmHg     Systemic VTI:  0.23 m MV Mean grad:  3.0 mmHg     Systemic Diam: 2.40 cm MV Vmax:       1.28 m/s MV Vmean:      79.1 cm/s MV Decel Time: 232 msec MV E velocity: 108.00 cm/s MV A velocity: 93.80 cm/s MV E/A ratio:  1.15 Cherlynn Kaiser MD Electronically signed by Cherlynn Kaiser MD Signature Date/Time: 09/28/2019/5:00:44 PM    Final    VAS [place] LOWER EXTREMITY VENOUS (DVT)  Result Date: 09/29/2019  Lower Venous DVTStudy Indications: Swelling, and Pain.  Risk Factors: Cancer History of lung cancer. Comparison Study: Prior study 11-17-2016 Bilateral LEV was WNL Performing Technologist: Darlin Coco  Examination Guidelines: A complete evaluation includes B-mode imaging, spectral Doppler, color Doppler, and power Doppler as needed of all accessible portions of each vessel. Bilateral testing is considered an integral part of a complete examination. Limited examinations for reoccurring indications may be performed as noted. The reflux portion of the exam is performed with the patient in reverse Trendelenburg.  +---------+---------------+---------+-----------+----------+--------------+ RIGHT    CompressibilityPhasicitySpontaneityPropertiesThrombus Aging +---------+---------------+---------+-----------+----------+--------------+ CFV      Full           Yes      Yes                                 +---------+---------------+---------+-----------+----------+--------------+ SFJ      Full                                                         +---------+---------------+---------+-----------+----------+--------------+  FV Prox  Full                                                        +---------+---------------+---------+-----------+----------+--------------+ FV Mid   Full                                                        +---------+---------------+---------+-----------+----------+--------------+ FV DistalFull                                                        +---------+---------------+---------+-----------+----------+--------------+ PFV      Full                                                        +---------+---------------+---------+-----------+----------+--------------+ POP      Full           Yes      Yes                                 +---------+---------------+---------+-----------+----------+--------------+ PTV      Full                                                        +---------+---------------+---------+-----------+----------+--------------+ PERO     Full                                                        +---------+---------------+---------+-----------+----------+--------------+   +---------+---------------+---------+-----------+----------+--------------+ LEFT     CompressibilityPhasicitySpontaneityPropertiesThrombus Aging +---------+---------------+---------+-----------+----------+--------------+ CFV      Full           Yes      Yes                                 +---------+---------------+---------+-----------+----------+--------------+ SFJ      Full                                                        +---------+---------------+---------+-----------+----------+--------------+ FV Prox  Full                                                        +---------+---------------+---------+-----------+----------+--------------+  FV Mid   Full                                                         +---------+---------------+---------+-----------+----------+--------------+ FV DistalFull                                                        +---------+---------------+---------+-----------+----------+--------------+ PFV      Full                                                        +---------+---------------+---------+-----------+----------+--------------+ POP      Full           Yes      Yes                                 +---------+---------------+---------+-----------+----------+--------------+ PTV      Full                                                        +---------+---------------+---------+-----------+----------+--------------+ PERO     Full                                                        +---------+---------------+---------+-----------+----------+--------------+     Summary: RIGHT: - There is no evidence of deep vein thrombosis in the lower extremity.  - No cystic structure found in the popliteal fossa.  LEFT: - There is no evidence of deep vein thrombosis in the lower extremity.  - No cystic structure found in the popliteal fossa.  *See table(s) above for measurements and observations. Electronically signed by Deitra Mayo MD on 09/29/2019 at 6:38:00 AM.    Final      Time Spent in minutes  30     [name] M.D on 09/29/2019 at 11:23 AM  To page go to [email or web address] - password [facility]

## 2019-09-30 ENCOUNTER — Inpatient Hospital Stay (HOSPITAL_COMMUNITY): Payer: Medicare Other

## 2019-09-30 DIAGNOSIS — I119 Hypertensive heart disease without heart failure: Secondary | ICD-10-CM

## 2019-09-30 DIAGNOSIS — K7031 Alcoholic cirrhosis of liver with ascites: Secondary | ICD-10-CM

## 2019-09-30 DIAGNOSIS — F17200 Nicotine dependence, unspecified, uncomplicated: Secondary | ICD-10-CM

## 2019-09-30 DIAGNOSIS — K7469 Other cirrhosis of liver: Secondary | ICD-10-CM | POA: Diagnosis present

## 2019-09-30 LAB — CBC WITH DIFFERENTIAL/PLATELET
Abs Immature Granulocytes: 0.03 10*3/uL (ref 0.00–0.07)
Basophils Absolute: 0 10*3/uL (ref 0.0–0.1)
Basophils Relative: 1 %
Eosinophils Absolute: 0.2 10*3/uL (ref 0.0–0.5)
Eosinophils Relative: 3 %
HCT: 33.4 % — ABNORMAL LOW (ref 39.0–52.0)
Hemoglobin: 9.7 g/dL — ABNORMAL LOW (ref 13.0–17.0)
Immature Granulocytes: 0 %
Lymphocytes Relative: 10 %
Lymphs Abs: 0.7 10*3/uL (ref 0.7–4.0)
MCH: 24.3 pg — ABNORMAL LOW (ref 26.0–34.0)
MCHC: 29 g/dL — ABNORMAL LOW (ref 30.0–36.0)
MCV: 83.5 fL (ref 80.0–100.0)
Monocytes Absolute: 0.8 10*3/uL (ref 0.1–1.0)
Monocytes Relative: 10 %
Neutro Abs: 5.5 10*3/uL (ref 1.7–7.7)
Neutrophils Relative %: 76 %
Platelets: 269 10*3/uL (ref 150–400)
RBC: 4 MIL/uL — ABNORMAL LOW (ref 4.22–5.81)
RDW: 17.8 % — ABNORMAL HIGH (ref 11.5–15.5)
WBC: 7.2 10*3/uL (ref 4.0–10.5)
nRBC: 0 % (ref 0.0–0.2)

## 2019-09-30 MED ORDER — FUROSEMIDE 10 MG/ML IJ SOLN
40.0000 mg | Freq: Once | INTRAMUSCULAR | Status: AC
  Administered 2019-09-30: 40 mg via INTRAVENOUS
  Filled 2019-09-30: qty 4

## 2019-09-30 MED ORDER — GUAIFENESIN ER 600 MG PO TB12
600.0000 mg | ORAL_TABLET | Freq: Two times a day (BID) | ORAL | Status: DC
  Administered 2019-09-30 – 2019-10-13 (×27): 600 mg via ORAL
  Filled 2019-09-30 (×27): qty 1

## 2019-09-30 NOTE — Progress Notes (Signed)
Charge RN/Director states pt can have sweat pants without a string, but no underwear. Pt verbalizes understanding.

## 2019-09-30 NOTE — Progress Notes (Signed)
[facility]  PROGRESS NOTE  Tyler Mcmillan EVO:[id number] DOB: 08-15-1945 DOA: 09/27/2019 PCP: [name], MD Admit date - 09/27/2019   Admitting Physician [name], MD  Outpatient Primary MD for the patient is [name], MD  LOS - 2 Brief Narrative   Tyler Mcmillan is a 74 y.o. year old male with medical history significant for alcohol abuse, non-small cell left lung cancer status post stereotactic radiotherapy, HTN, AAA repair who presented on 8/19 with intentional overdose of oxycodone for suicide attempt and was found to have acute hypoxic respiratory failure of multifactorial etiology related to opiate overdose, aspiration pneumonia requiring IV Zosyn and chronic left-sided pleural effusion.     Subjective  Still with cough, nonproductive. No chest pain. States sob with exertion. Very concerned about swelling in legs and recent weight gain  A & P   Acute hypoxic respiratory failure, stable.  In the setting of attempted suicide with opioid overdose likely further complicated by element of aspiration pneumonia in a patient with chronic left loculated pleural effusion.  Repeat chest x-ray shows consolidation at bases concerning for pneumonia, effusion remains stable on CT chest imaging also shows atelectasis. CT scan also mentions background of emphysema, but no current wheezing -continueaugmentin -Wean O2 as able, SPO2 greater than 92%, ambulatory O2 testing -Monitor chronic effusion (in setting of known lung cancer), no current indication for thoracentesis given stable size -Encourage incentive spirometry, add flutter valve, schedule mucinex   Bibasilar pneumonia, likely aspiration pneumonia.  CT chest shows bibasilar disease. Likely combination of aspiration pneumonia and atlectasis. Some concern for aspiration pneumonia given overdose with opioid, would expect that to be on the right side.  Patient remains afebrile without white count and maintaining  adequate O2 saturation on 3 L.  -Wean O2 as able -Cde-escalating to zosyn --Encourage incentive spirometry, flutter vlave -Monitor CBC  Bilateral lower extremity swelling.  Patient states has been ongoing for a week, nonpitting. TTE and venous duplex negative. Cirrhosis mentioned on ct chest --RUQ u/s to assess liver and for ascites --IV lasix and assess output and swelling --supportive care with elevation of legs encouraged  Non-small cell lung cancer of left lower lobe (diagnosed 07/2016) with chronic left pleural effusion followed by Dr. [name].  Curative stereotactic radiotherapy to the left lung mass in September 2018.  In 2020 required hospitalization and placement of Pleurx catheter for drainage that was ultimately removed.  Last evaluated by CT surgery (Dr. [name]) on 08/29/2018 we do not recommend resection of lung mass given no evidence of progression of disease on past CT scan -CT chest shows stable lung mass and chronic effusion with only small right sided pleural effusion --monitor, wean o2   Intentional opioid overdose with suicide attempt Admits to feeling like a burden to his stepson, social isolation. -Once medically stable psych recommends inpatient psych treatment -Currently IVC in place -sitter in place -Continue suicide precautions  History of abdominal aortic aneurysm, repaired with stent graft August 2018  Alcohol abuse Reportedly drank a whole bottle of wine in addition to his opioids.  No current signs or symptoms of withdrawal -Monitor on CIWA protocol -Folic acid, multivitamin, thiamine  Chronic pain Takes oxycodone IR 20 mg every 6 hours, follows with pain management clinic, confirmed only provider for pain management on [place] controlled substance reporting database -Decrease oxycodone IR to 15 mg every 4 hours as needed in light of recent overdose, but this is to help with any pain management related to his malignancy  Cirrhosis. Mentioned on  CT chest. Patient does admit to alcohol abuse. wnl INR and plt. HCV neg. Likely cause of peripheral edema --Ruq [place] --IV lasix         Family Communication  : None  Code Status : Full code, discussed on day of admission  Disposition Plan  :  Patient is from home. Anticipated d/c date: 1 to 2 days. Barriers to d/c or necessity for inpatient status:  wean O2 as able for acute hypoxic respiratory failure, a, close monitoring for alcohol withdrawal Consults  : Psych  Procedures  : TTE, venous duplex  DVT Prophylaxis  :  Lovenox  Lab Results  Component Value Date   PLT 269 09/30/2019    Diet :  Diet Order            Diet Heart Room service appropriate? Yes; Fluid consistency: Thin  Diet effective now                  Inpatient Medications Scheduled Meds: . amoxicillin-clavulanate  1 tablet Oral Q12H  . docusate sodium  100 mg Oral BID  . enoxaparin (LOVENOX) injection  40 mg Subcutaneous Daily  . folic acid  1 mg Oral Daily  . furosemide  40 mg Intravenous Once  . guaiFENesin  600 mg Oral BID  . multivitamin with minerals  1 tablet Oral Daily  . nicotine  14 mg Transdermal Daily  . pantoprazole  40 mg Oral BID  . thiamine  100 mg Oral Daily   Or  . thiamine  100 mg Intravenous Daily   Continuous Infusions:  PRN Meds:.acetaminophen **OR** acetaminophen, bisacodyl, clonazePAM, hydrALAZINE, LORazepam **OR** LORazepam, ondansetron **OR** ondansetron (ZOFRAN) IV, oxyCODONE, polyethylene glycol  Antibiotics  :   Anti-infectives (From admission, onward)   Start     Dose/Rate Route Frequency Ordered Stop   09/29/19 1215  amoxicillin-clavulanate (AUGMENTIN) 875-125 MG per tablet 1 tablet        1 tablet Oral Every 12 hours 09/29/19 1123 10/04/19 0959   09/27/19 1130  piperacillin-tazobactam (ZOSYN) IVPB 3.375 g  Status:  Discontinued        3.375 g 12.5 mL/hr over 240 Minutes Intravenous Every 8 hours 09/27/19 0605 09/29/19 1123   09/27/19 0415  piperacillin-tazobactam  (ZOSYN) IVPB 3.375 g        3.375 g 100 mL/hr over 30 Minutes Intravenous  Once 09/27/19 0408 09/27/19 0602       Objective   Vitals:   09/29/19 1549 09/29/19 1920 09/30/19 0019 09/30/19 0820  BP: (!) 146/70 (!) 153/80 (!) 161/84 (!) 159/79  Pulse: 79 79 79 88  Resp: 20 16 18 18   Temp: 98.5 F (36.9 C) 98.5 F (36.9 C)  98.9 F (37.2 C)  TempSrc: Oral Oral  Oral  SpO2: 98% 98% 99% 98%  Weight:   88.4 kg   Height:        SpO2: 98 % O2 Flow Rate (L/min): 3 L/min  Wt Readings from Last 3 Encounters:  09/30/19 88.4 kg  05/16/18 73.5 kg  04/17/18 71.2 kg     Intake/Output Summary (Last 24 hours) at 09/30/2019 1107 Last data filed at 09/30/2019 0349 Gross per 24 hour  Intake 420 ml  Output 1 ml  Net 419 ml    Physical Exam:     Awake Alert, Oriented X 3, flat No new F.N deficits,  [place].AT, Normal respiratory effort on 3L [place], decreased breath sounds on left, occasional rhonchi, no wheezing,  RRR,No Gallops,Rubs  or new Murmurs,  +ve B.Sounds, Abd Soft, No tenderness, No rebound, guarding or rigidity. Non-pitting edema of bilateral lower extremities to ankles   I have personally reviewed the following:   Data Reviewed:  CBC Recent Labs  Lab 09/27/19 0402 09/28/19 0457 09/29/19 0235 09/30/19 0627  WBC 8.4 6.4 6.5 7.2  HGB 10.6* 9.1* 8.9* 9.7*  HCT 39.1 32.1* 31.8* 33.4*  PLT 298 258 250 269  MCV 85.7 85.4 83.7 83.5  MCH 23.2* 24.2* 23.4* 24.3*  MCHC 27.1* 28.3* 28.0* 29.0*  RDW 18.4* 18.3* 18.0* 17.8*  LYMPHSABS 0.4* 0.5* 0.5* 0.7  MONOABS 0.7 0.7 0.7 0.8  EOSABS 0.0 0.1 0.1 0.2  BASOSABS 0.0 0.0 0.0 0.0    Chemistries  Recent Labs  Lab 09/27/19 0402 09/28/19 0457 09/29/19 0235  NA 136 140 139  K 4.7 4.1 4.0  CL 100 100 101  CO2 26 29 30   GLUCOSE 113* 88 100*  BUN 11 15 12   CREATININE 1.13 1.17 0.97  CALCIUM 8.7* 8.4* 8.3*  AST 30  --   --   ALT 13  --   --   ALKPHOS 68  --   --   BILITOT 0.5  --   --     ------------------------------------------------------------------------------------------------------------------ No results for input(s): CHOL, HDL, LDLCALC, TRIG, CHOLHDL, LDLDIRECT in the last 72 hours.  No results found for: HGBA1C ------------------------------------------------------------------------------------------------------------------ No results for input(s): TSH, T4TOTAL, T3FREE, THYROIDAB in the last 72 hours.  Invalid input(s): FREET3 ------------------------------------------------------------------------------------------------------------------ No results for input(s): VITAMINB12, FOLATE, FERRITIN, TIBC, IRON, RETICCTPCT in the last 72 hours.  Coagulation profile Recent Labs  Lab 09/29/19 1222  INR 1.1    No results for input(s): DDIMER in the last 72 hours.  Cardiac Enzymes No results for input(s): CKMB, TROPONINI, MYOGLOBIN in the last 168 hours.  Invalid input(s): CK ------------------------------------------------------------------------------------------------------------------    Component Value Date/Time   BNP 609.5 (H) 09/27/2019 0403    Micro Results Recent Results (from the past 240 hour(s))  SARS Coronavirus 2 by RT PCR (hospital order, performed in [facility] hospital lab) Nasopharyngeal Nasopharyngeal Swab     Status: None   Collection Time: 09/27/19  4:18 AM   Specimen: Nasopharyngeal Swab  Result Value Ref Range Status   SARS Coronavirus 2 NEGATIVE NEGATIVE Final    Comment: (NOTE) SARS-CoV-2 target nucleic acids are NOT DETECTED.  The SARS-CoV-2 RNA is generally detectable in upper and lower respiratory specimens during the acute phase of infection. The lowest concentration of SARS-CoV-2 viral copies this assay can detect is 250 copies / mL. A negative result does not preclude SARS-CoV-2 infection and should not be used as the sole basis for treatment or other patient management decisions.  A negative result may occur  with improper specimen collection / handling, submission of specimen other than nasopharyngeal swab, presence of viral mutation(s) within the areas targeted by this assay, and inadequate number of viral copies (<250 copies / mL). A negative result must be combined with clinical observations, patient history, and epidemiological information.  Fact Sheet for Patients:   StrictlyIdeas.no  Fact Sheet for Healthcare Providers: BankingDealers.co.za  This test is not yet approved or  cleared by the [place] FDA and has been authorized for detection and/or diagnosis of SARS-CoV-2 by FDA under an Emergency Use Authorization (EUA).  This EUA will remain in effect (meaning this test can be used) for the duration of the COVID-19 declaration under Section 564(b)(1) of the Act, 21 U.S.C. section 360bbb-3(b)(1), unless the authorization is  terminated or revoked sooner.  Performed at [facility], [place] [address], [place], [place] [address]   Blood culture (routine x 2)     Status: None (Preliminary result)   Collection Time: 09/27/19  5:04 AM   Specimen: BLOOD RIGHT HAND  Result Value Ref Range Status   Specimen Description BLOOD RIGHT HAND  Final   Special Requests   Final    BOTTLES DRAWN AEROBIC AND ANAEROBIC Blood Culture results may not be optimal due to an inadequate volume of blood received in culture bottles   Culture   Final    NO GROWTH 3 DAYS Performed at [facility], [place] [address], [place], [place] [address]    Report Status PENDING  Incomplete  Blood culture (routine x 2)     Status: None (Preliminary result)   Collection Time: 09/27/19  5:04 AM   Specimen: BLOOD LEFT HAND  Result Value Ref Range Status   Specimen Description BLOOD LEFT HAND  Final   Special Requests   Final    BOTTLES DRAWN AEROBIC AND ANAEROBIC Blood Culture results may not be optimal due to an inadequate volume of blood received in culture  bottles   Culture   Final    NO GROWTH 3 DAYS Performed at [facility], [place] [address] [place],  [address]    Report Status PENDING  Incomplete    Radiology Reports DG Chest 2 View  Result Date: 09/28/2019 CLINICAL DATA:  Hypoxia EXAM: CHEST - 2 VIEW COMPARISON:  Chest radiograph 09/27/2019, CT chest 07/10/2018 FINDINGS: When accounting for differences in technique, likely similar loculated left pleural effusion. No discernible pneumothorax. New left basilar consolidation with silhouetting of the left hemidiaphragm. Similar chronic scarring and architectural distortion, most pronounced in the left upper lobe. Similar left lung volume loss with leftward shift of the mediastinum. Similar mild enlargement the cardiopericardial silhouette. Approximately 2.6 cm central cavitary mass in the left upper lobe, better characterized on prior CT chest from 07/10/2018 and consistent with known primary bronchogenic neoplasm. No acute osseous abnormality. Although suboptimally evaluated, grossly similar appearance of anterior wedging of multiple thoracic vertebral bodies and a chronic L1 compression fracture. IMPRESSION: 1. New left basilar consolidation, concerning for aspiration and/or pneumonia. 2. Loculated left pleural effusion, likely similar when accounting for differences in technique. 3. Redemonstrated left upper lobe mass, consistent with known primary bronchogenic neoplasm.CT chest could better assess for progression, if indicated. Electronically Signed   By: Margaretha Sheffield MD   On: 09/28/2019 08:34   CT CHEST W CONTRAST  Result Date: 09/28/2019 CLINICAL DATA:  74 year old male with history of non-small cell lung cancer and pleural effusion. EXAM: CT CHEST WITH CONTRAST TECHNIQUE: Multidetector CT imaging of the chest was performed during intravenous contrast administration. CONTRAST:  52mL OMNIPAQUE IOHEXOL 300 MG/ML  SOLN COMPARISON:  Chest CT dated 07/10/2018. FINDINGS:  Cardiovascular: There is no cardiomegaly or pericardial effusion. There is advanced 3 vessel coronary vascular calcification. There is severe atherosclerotic calcification of the thoracic aorta. No aneurysmal dilatation or dissection. Evaluation of the pulmonary arteries is limited due to suboptimal opacification of the peripheral branches. No central pulmonary artery embolus identified. Mediastinum/Nodes: No hilar adenopathy. Top-normal subcarinal lymph node measures 10 mm in short axis. The esophagus is grossly unremarkable. No mediastinal fluid collection. Lungs/Pleura: There is a 2.6 x 3.6 cm left upper lobe mass similar to prior CT in keeping with known malignancy. Areas of linear and reticular densities adjacent to the mass likely  scarring and related to post treatment changes. A 2.1 x 1.4 cm nodular density along the left mediastinal pleural (49/3) likely scarring. Overall there is volume loss in the left upper lobe. There is background of emphysema. There are small bilateral pleural effusions. The right pleural effusion is new since the prior CT. There are bibasilar compressive atelectasis or pneumonia. There is no pneumothorax. The central airways are patent. Upper Abdomen: Cirrhosis. Small perihepatic free fluid. 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 Musculoskeletal: Osteopenia with degenerative changes of the spine. L1 compression fracture, seen previously. No acute osseous pathology. IMPRESSION: 1. No CT evidence of central pulmonary artery embolus. 2. No significant interval change in the size of left upper lobe mass/malignancy and surrounding post radiation changes/scarring. 3. Small bilateral pleural effusions. The right pleural effusion is new since the prior CT. 4. Bibasilar compressive atelectasis or pneumonia. 5. Cirrhosis with small perihepatic free fluid. 6. Aortic Atherosclerosis (ICD10-I70.0) and Emphysema (ICD10-J43.9).  Electronically Signed   By: Anner Crete M.D.   On: 09/28/2019 22:30   DG Chest Port 1 View  Result Date: 09/27/2019 CLINICAL DATA:  Suicide attempt, took 41 oxycodone and drink a bottle of wine EXAM: PORTABLE CHEST 1 VIEW COMPARISON:  CT 07/10/2018 FINDINGS: Chronic left pleural effusion resulting in some gradient opacity of the left lung reflecting layering fluid. Chronic scarring and architectural distortion changes in the left upper lung as well as a more masslike opacity likely corresponding to a site of prior malignancy and or treatment related change. Background of chronically coarsened interstitial changes throughout both lungs. No right effusion. No visible pneumothorax. The aorta is calcified. The remaining cardiomediastinal contours are unremarkable. Stable mild cardiomegaly. No acute osseous or soft tissue abnormality. Degenerative changes are present in the imaged spine and shoulders. Telemetry leads overlie the chest. IMPRESSION: 1. Chronic left pleural effusion, not significantly changed from prior counting for differences in technique and inflation. 2. Low volumes and atelectasis. 3. Chronic scarring and architectural distortion changes in the left upper lung 4. Masslike opacity likely corresponding to a site of prior malignancy and or treatment related change in the left upper lobe. 5. Additional chronic coarsened interstitial changes. 6. Stable cardiomegaly. 7.  Aortic Atherosclerosis (ICD10-I70.0). Electronically Signed   By: Lovena Le M.D.   On: 09/27/2019 04:40   ECHOCARDIOGRAM COMPLETE  Result Date: 09/28/2019    ECHOCARDIOGRAM REPORT   Patient Name:   AHIJAH DEVERY Date of Exam: 09/28/2019 Medical Rec #:  [id number]             Height:       70.0 in Accession #:    [id number]            Weight:       198.6 lb Date of Birth:  07-12-1945             BSA:          2.081 m Patient Age:    89 years              BP:           133/61 mmHg Patient Gender: M                     HR:            89 bpm. Exam Location:  Inpatient Procedure: 2D Echo, Cardiac Doppler, Color Doppler and Intracardiac  Opacification Agent Indications:    Peripheral edema  History:        Patient has prior history of Echocardiogram examinations, most                 recent 04/03/2018. Risk Factors:Hypertension and Current Smoker.                 Acute respiratory falure with hypoxia. ETOH abuse.  Sonographer:    Clayton Lefort RDCS (AE) Referring Phys: [id number] [place]  1. Left ventricular ejection fraction, by estimation, is 60 to 65%. The left ventricle has normal function. The left ventricle has no regional wall motion abnormalities. There is severe asymmetric left ventricular hypertrophy of the basal-septal and mild concentric LVH. Left ventricular diastolic parameters were normal.  2. Right ventricular systolic function is normal. The right ventricular size is normal. Tricuspid regurgitation signal is inadequate for assessing PA pressure.  3. The mitral valve is grossly normal. Trivial mitral valve regurgitation. No evidence of mitral stenosis.  4. The aortic valve is abnormal. Aortic valve regurgitation is not visualized. Mild aortic valve sclerosis is present, with no evidence of aortic valve stenosis.  5. The inferior vena cava is dilated in size with <50% respiratory variability, suggesting right atrial pressure of 15 mmHg.  6. Aortic dilatation noted. There is borderline dilatation of the aortic root measuring 39 mm. FINDINGS  Left Ventricle: Left ventricular ejection fraction, by estimation, is 60 to 65%. The left ventricle has normal function. The left ventricle has no regional wall motion abnormalities. Definity contrast agent was given IV to delineate the left ventricular  endocardial borders. The left ventricular internal cavity size was normal in size. There is severe asymmetric left ventricular hypertrophy of the basal-septal and mild concentric LVH segments. Left ventricular  diastolic parameters were normal. Right Ventricle: The right ventricular size is normal. No increase in right ventricular wall thickness. Right ventricular systolic function is normal. Tricuspid regurgitation signal is inadequate for assessing PA pressure. Left Atrium: Left atrial size was normal in size. Right Atrium: Right atrial size was normal in size. Pericardium: There is no evidence of pericardial effusion. Mitral Valve: The mitral valve is grossly normal. Normal mobility of the mitral valve leaflets. Trivial mitral valve regurgitation. No evidence of mitral valve stenosis. MV peak gradient, 6.6 mmHg. The mean mitral valve gradient is 3.0 mmHg with average heart rate of 88 bpm. Tricuspid Valve: The tricuspid valve is grossly normal. Tricuspid valve regurgitation is trivial. No evidence of tricuspid stenosis. Aortic Valve: The aortic valve is abnormal. Aortic valve regurgitation is not visualized. Mild aortic valve sclerosis is present, with no evidence of aortic valve stenosis. There is mild calcification of the aortic valve. Aortic valve mean gradient measures 5.0 mmHg. Aortic valve peak gradient measures 8.1 mmHg. Aortic valve area, by VTI measures 3.50 cm. Pulmonic Valve: The pulmonic valve was not well visualized. Pulmonic valve regurgitation is not visualized. No evidence of pulmonic stenosis. Aorta: Aortic dilatation noted. There is borderline dilatation of the aortic root measuring 39 mm. Venous: The inferior vena cava is dilated in size with less than 50% respiratory variability, suggesting right atrial pressure of 15 mmHg. IAS/Shunts: No atrial level shunt detected by color flow Doppler.  LEFT VENTRICLE PLAX 2D LVIDd:         4.00 cm  Diastology LVIDs:         2.60 cm  LV e' lateral:   8.38 cm/s LV PW:  1.50 cm  LV E/e' lateral: 12.9 LV IVS:        2.10 cm  LV e' medial:    7.62 cm/s LVOT diam:     2.40 cm  LV E/e' medial:  14.2 LV SV:         105 LV SV Index:   51 LVOT Area:     4.52 cm   RIGHT VENTRICLE             IVC RV Basal diam:  4.70 cm     IVC diam: 2.40 cm RV Mid diam:    4.30 cm RV S prime:     12.60 cm/s TAPSE (M-mode): 2.0 cm LEFT ATRIUM              Index       RIGHT ATRIUM           Index LA diam:        4.00 cm  1.92 cm/m  RA Area:     22.20 cm LA Vol (A2C):   101.0 ml 48.53 ml/m RA Volume:   63.90 ml  30.70 ml/m LA Vol (A4C):   34.9 ml  16.77 ml/m LA Biplane Vol: 60.2 ml  28.93 ml/m  AORTIC VALVE AV Area (Vmax):    3.73 cm AV Area (Vmean):   3.67 cm AV Area (VTI):     3.50 cm AV Vmax:           142.00 cm/s AV Vmean:          107.000 cm/s AV VTI:            0.301 m AV Peak Grad:      8.1 mmHg AV Mean Grad:      5.0 mmHg LVOT Vmax:         117.00 cm/s LVOT Vmean:        86.800 cm/s LVOT VTI:          0.233 m LVOT/AV VTI ratio: 0.77  AORTA Ao Root diam: 3.90 cm Ao Asc diam:  3.80 cm MITRAL VALVE MV Area (PHT): 3.27 cm     SHUNTS MV Peak grad:  6.6 mmHg     Systemic VTI:  0.23 m MV Mean grad:  3.0 mmHg     Systemic Diam: 2.40 cm MV Vmax:       1.28 m/s MV Vmean:      79.1 cm/s MV Decel Time: 232 msec MV E velocity: 108.00 cm/s MV A velocity: 93.80 cm/s MV E/A ratio:  1.15 Cherlynn Kaiser MD Electronically signed by Cherlynn Kaiser MD Signature Date/Time: 09/28/2019/5:00:44 PM    Final    VAS [place] LOWER EXTREMITY VENOUS (DVT)  Result Date: 09/29/2019  Lower Venous DVTStudy Indications: Swelling, and Pain.  Risk Factors: Cancer History of lung cancer. Comparison Study: Prior study 11-17-2016 Bilateral LEV was WNL Performing Technologist: Darlin Coco  Examination Guidelines: A complete evaluation includes B-mode imaging, spectral Doppler, color Doppler, and power Doppler as needed of all accessible portions of each vessel. Bilateral testing is considered an integral part of a complete examination. Limited examinations for reoccurring indications may be performed as noted. The reflux portion of the exam is performed with the patient in reverse Trendelenburg.   +---------+---------------+---------+-----------+----------+--------------+ RIGHT    CompressibilityPhasicitySpontaneityPropertiesThrombus Aging +---------+---------------+---------+-----------+----------+--------------+ CFV      Full           Yes      Yes                                 +---------+---------------+---------+-----------+----------+--------------+  SFJ      Full                                                        +---------+---------------+---------+-----------+----------+--------------+ FV Prox  Full                                                        +---------+---------------+---------+-----------+----------+--------------+ FV Mid   Full                                                        +---------+---------------+---------+-----------+----------+--------------+ FV DistalFull                                                        +---------+---------------+---------+-----------+----------+--------------+ PFV      Full                                                        +---------+---------------+---------+-----------+----------+--------------+ POP      Full           Yes      Yes                                 +---------+---------------+---------+-----------+----------+--------------+ PTV      Full                                                        +---------+---------------+---------+-----------+----------+--------------+ PERO     Full                                                        +---------+---------------+---------+-----------+----------+--------------+   +---------+---------------+---------+-----------+----------+--------------+ LEFT     CompressibilityPhasicitySpontaneityPropertiesThrombus Aging +---------+---------------+---------+-----------+----------+--------------+ CFV      Full           Yes      Yes                                  +---------+---------------+---------+-----------+----------+--------------+ SFJ      Full                                                        +---------+---------------+---------+-----------+----------+--------------+  FV Prox  Full                                                        +---------+---------------+---------+-----------+----------+--------------+ FV Mid   Full                                                        +---------+---------------+---------+-----------+----------+--------------+ FV DistalFull                                                        +---------+---------------+---------+-----------+----------+--------------+ PFV      Full                                                        +---------+---------------+---------+-----------+----------+--------------+ POP      Full           Yes      Yes                                 +---------+---------------+---------+-----------+----------+--------------+ PTV      Full                                                        +---------+---------------+---------+-----------+----------+--------------+ PERO     Full                                                        +---------+---------------+---------+-----------+----------+--------------+     Summary: RIGHT: - There is no evidence of deep vein thrombosis in the lower extremity.  - No cystic structure found in the popliteal fossa.  LEFT: - There is no evidence of deep vein thrombosis in the lower extremity.  - No cystic structure found in the popliteal fossa.  *See table(s) above for measurements and observations. Electronically signed by Deitra Mayo MD on 09/29/2019 at 6:38:00 AM.    Final      Time Spent in minutes  30     [name] M.D on 09/30/2019 at 11:07 AM  To page go to [email or web address] - password [email or web address]

## 2019-09-30 NOTE — Progress Notes (Signed)
Pt CIWA= 6. RN unable to give Ativan because order ended at 1830 09/30/19. MD notified to extend order. Will give when order is placed.

## 2019-09-30 NOTE — Progress Notes (Signed)
RN educated pt regarding incentive spirometer per MD request. Pt verbalizes understanding.   MD states pt can have sips with meds until abdominal US.

## 2019-10-01 DIAGNOSIS — K7469 Other cirrhosis of liver: Secondary | ICD-10-CM

## 2019-10-01 LAB — COMPREHENSIVE METABOLIC PANEL
ALT: 14 U/L (ref 0–44)
AST: 26 U/L (ref 15–41)
Albumin: 2.7 g/dL — ABNORMAL LOW (ref 3.5–5.0)
Alkaline Phosphatase: 55 U/L (ref 38–126)
Anion gap: 10 (ref 5–15)
BUN: 8 mg/dL (ref 8–23)
CO2: 28 mmol/L (ref 22–32)
Calcium: 8.5 mg/dL — ABNORMAL LOW (ref 8.9–10.3)
Chloride: 98 mmol/L (ref 98–111)
Creatinine, Ser: 0.8 mg/dL (ref 0.61–1.24)
GFR calc Af Amer: >60 mL/min (ref >60)
GFR calc non Af Amer: >60 mL/min (ref >60)
Glucose, Bld: 106 mg/dL — ABNORMAL HIGH (ref 70–99)
Potassium: 3.3 mmol/L — ABNORMAL LOW (ref 3.5–5.1)
Sodium: 136 mmol/L (ref 135–145)
Total Bilirubin: 0.5 mg/dL (ref 0.3–1.2)
Total Protein: 6.6 g/dL (ref 6.5–8.1)

## 2019-10-01 LAB — CBC WITH DIFFERENTIAL/PLATELET
Abs Immature Granulocytes: 0.02 10*3/uL (ref 0.00–0.07)
Basophils Absolute: 0 10*3/uL (ref 0.0–0.1)
Basophils Relative: 1 %
Eosinophils Absolute: 0.2 10*3/uL (ref 0.0–0.5)
Eosinophils Relative: 4 %
HCT: 33.2 % — ABNORMAL LOW (ref 39.0–52.0)
Hemoglobin: 9.7 g/dL — ABNORMAL LOW (ref 13.0–17.0)
Immature Granulocytes: 0 %
Lymphocytes Relative: 8 %
Lymphs Abs: 0.5 10*3/uL — ABNORMAL LOW (ref 0.7–4.0)
MCH: 24.1 pg — ABNORMAL LOW (ref 26.0–34.0)
MCHC: 29.2 g/dL — ABNORMAL LOW (ref 30.0–36.0)
MCV: 82.4 fL (ref 80.0–100.0)
Monocytes Absolute: 0.7 10*3/uL (ref 0.1–1.0)
Monocytes Relative: 11 %
Neutro Abs: 4.8 10*3/uL (ref 1.7–7.7)
Neutrophils Relative %: 76 %
Platelets: 251 10*3/uL (ref 150–400)
RBC: 4.03 MIL/uL — ABNORMAL LOW (ref 4.22–5.81)
RDW: 17.5 % — ABNORMAL HIGH (ref 11.5–15.5)
WBC: 6.3 10*3/uL (ref 4.0–10.5)
nRBC: 0 % (ref 0.0–0.2)

## 2019-10-01 IMAGING — CT CT CHEST W/ CM
2 of 4 series · 15 of 36 positions shown, 18 images · IV contrast (ISOVUE 300)
Comparison: CT 12/08/2016

CLINICAL DATA: 75ml isovue888 Left lung cancer dx'd August 2016. "Mr.
Enyong called complaining of some pain in his chest. He describes
as a sharp stabbing pain in the left side of his chest, non
radiating pain. He states that the pain st...*comment was
truncated*^75mL KOXIFV-PCC IOPAMIDOL (KOXIFV-PCC) INJECTION 61%

EXAM:
CT CHEST WITH CONTRAST
TECHNIQUE: Multidetector CT imaging of the chest was performed during
intravenous contrast administration.
CONTRAST:  75mL KOXIFV-PCC IOPAMIDOL (KOXIFV-PCC) INJECTION 61%

[Series 2: axial st · axial · 0.92mm/px · z∈[-108,+144]mm · 12 of 150 slices shown, 15 images]
[im 12/150  mediastinal]
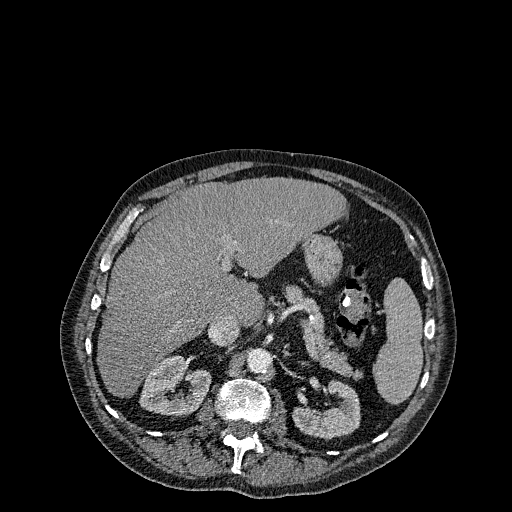
[im 12/150  lung]
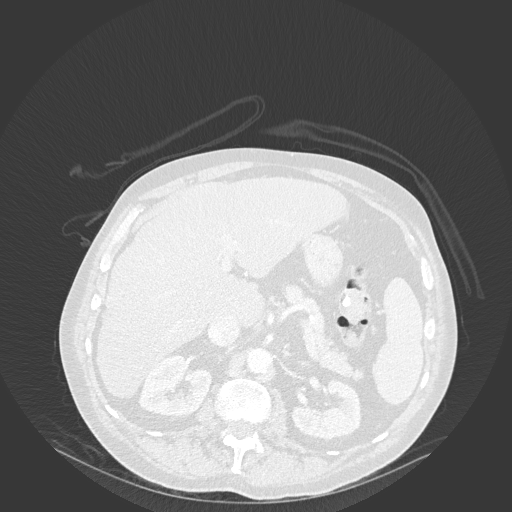
[im 23/150  lung]
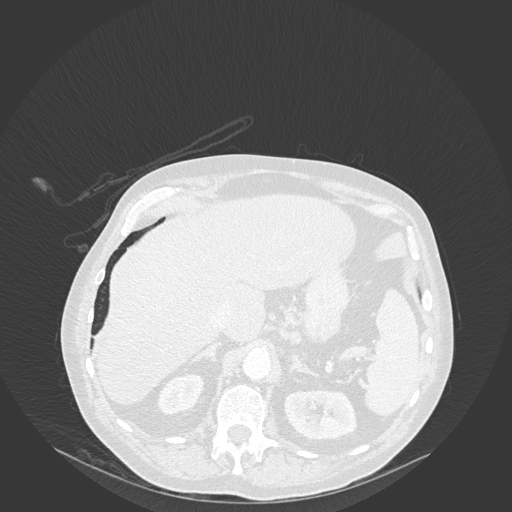
[im 35/150  lung]
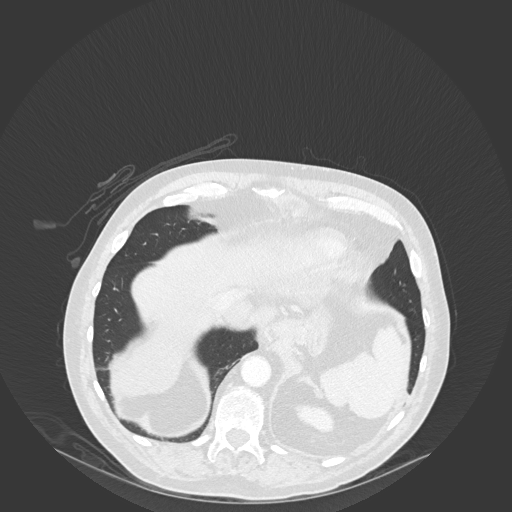
[im 46/150  lung]
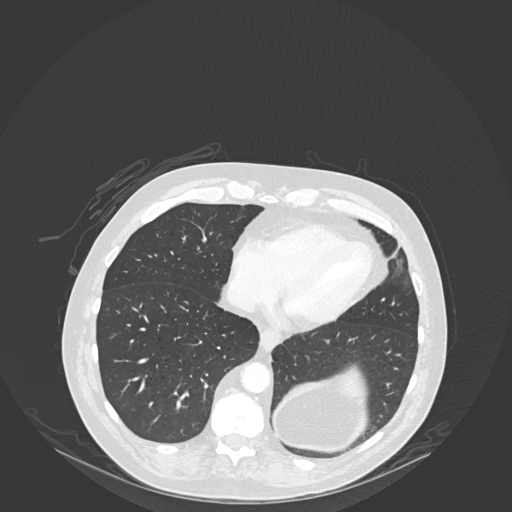
[im 58/150  mediastinal]
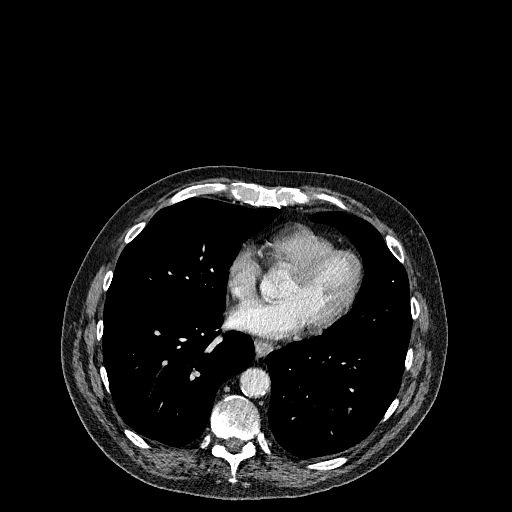
[im 58/150  lung]
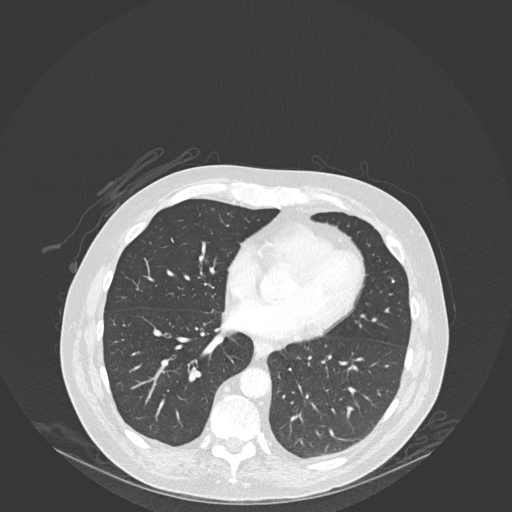
[im 69/150  lung]
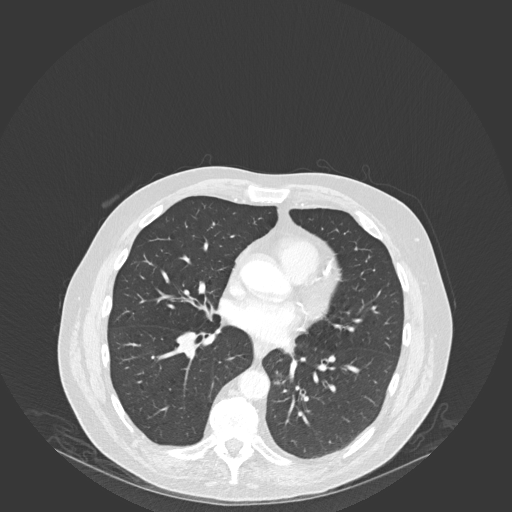
[im 81/150  lung]
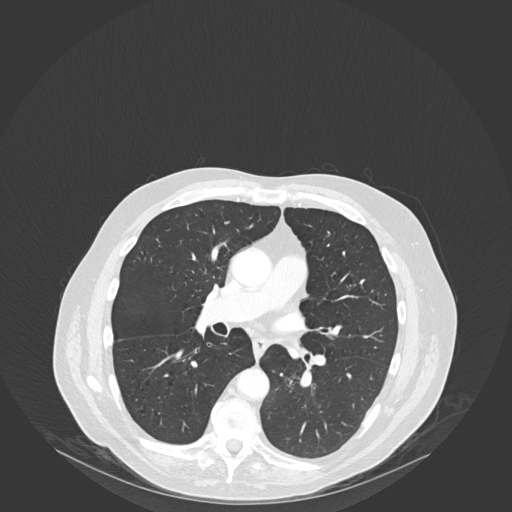
[im 92/150  lung]
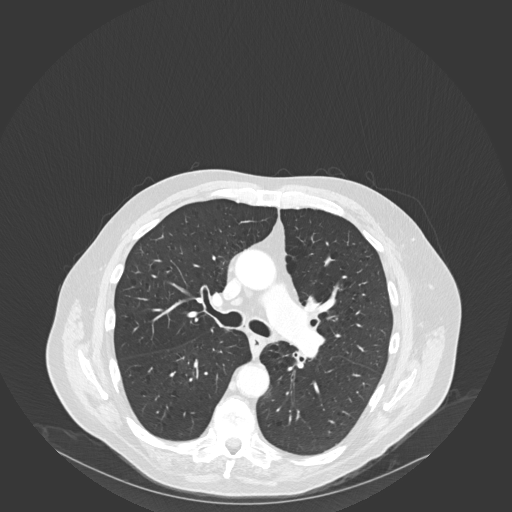
[im 104/150  mediastinal]
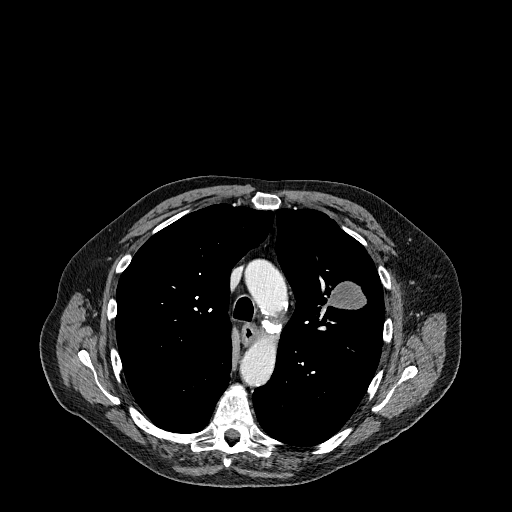
[im 104/150  lung]
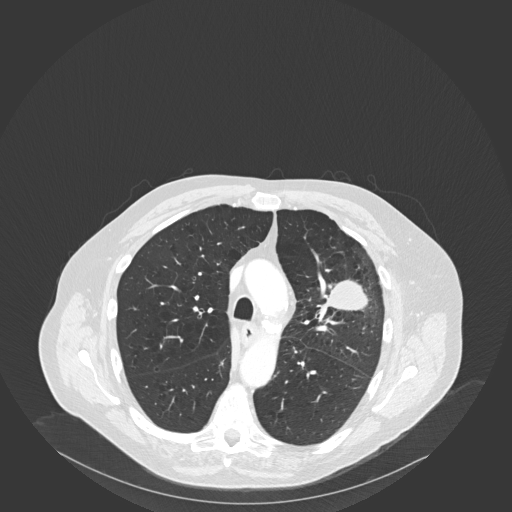
[im 115/150  lung]
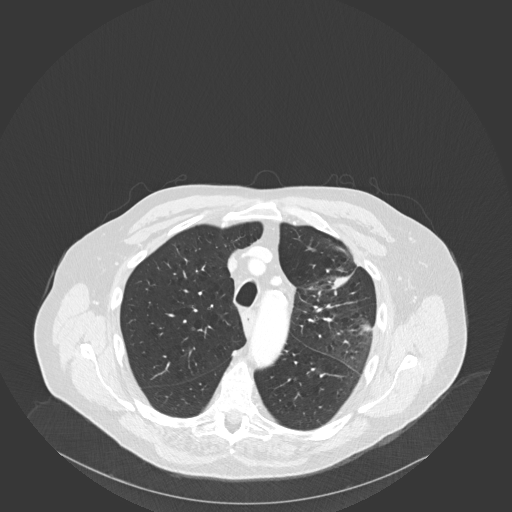
[im 127/150  lung]
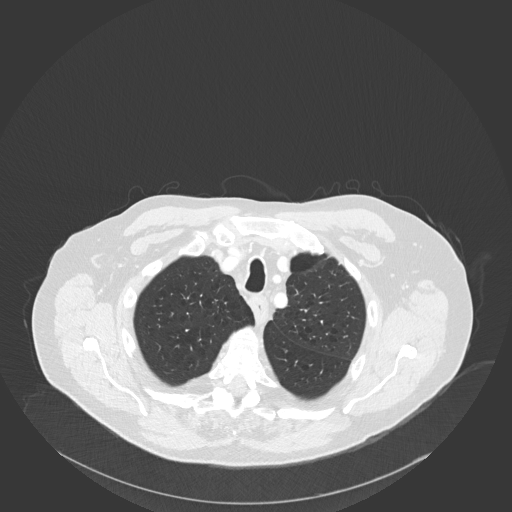
[im 138/150  lung]
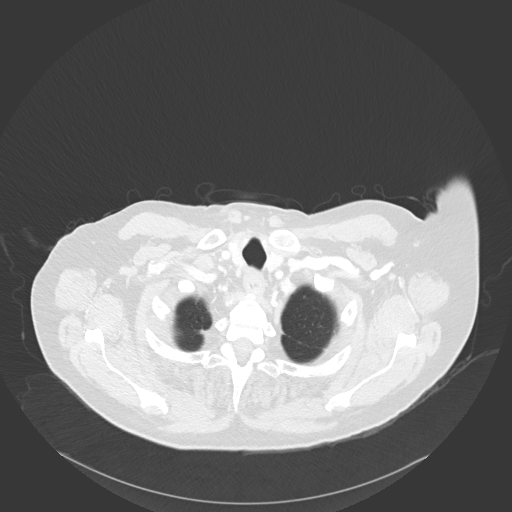

[Series 5: coronal · coronal · 0.64mm/px · 3 of 161 slices shown]
[im 33/161  lung]
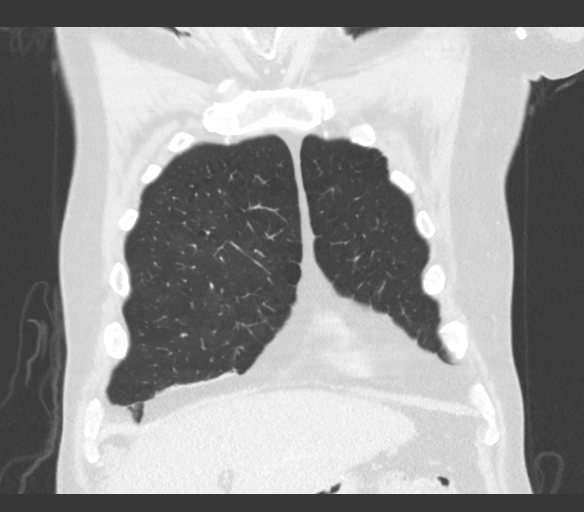
[im 65/161  lung]
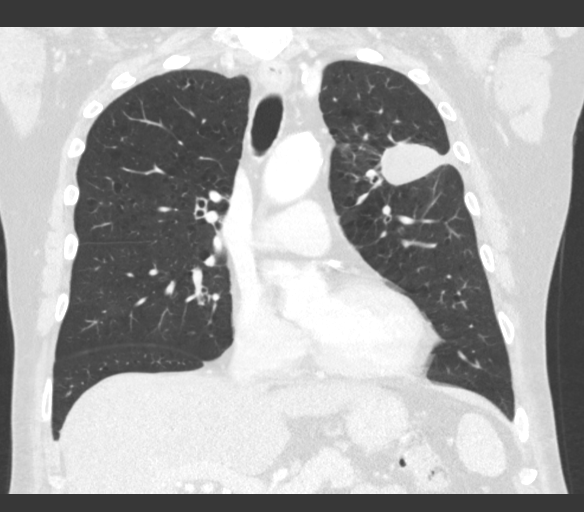
[im 97/161  lung]
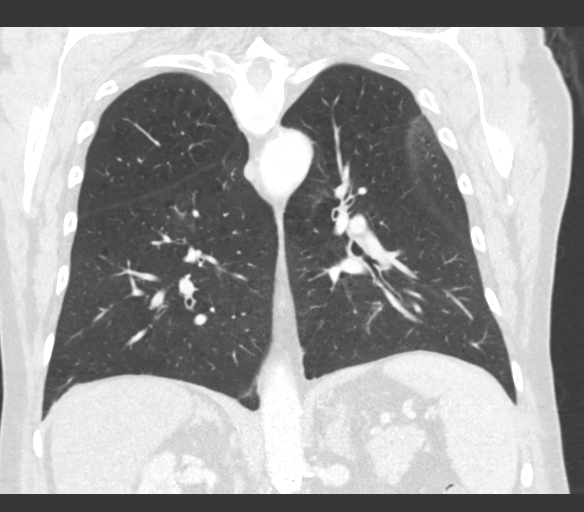

[15 of 36 positions shown; findings below may reference images not displayed]

FINDINGS: Cardiovascular: Coronary artery calcification and aortic
atherosclerotic calcification.

Mediastinum/Nodes: No axillary supraclavicular adenopathy. No
mediastinal hilar adenopathy. No pericardial effusion. Esophagus
normal

Lungs/Pleura: Relatively low-density lesion in the LEFT upper lobe
measures 3.1 x 3.4 cm compared with 3.3 x 3.7 cm. Lesion is now
completely opacified where as previously a small gas collection
within the lesion.

There is some volume loss and atelectasis in the LEFT upper superior
to the mass unchanged.. Small focus of branching nodularity in the
medial LEFT lower lobe on image 69/7. RIGHT lung clear

Upper Abdomen: Limited view of the liver, kidneys, pancreas are
unremarkable. Normal adrenal glands.

Musculoskeletal: No aggressive osseous lesion.
IMPRESSION: Interval decrease in size of LEFT upper lobe cavitary mass. Lesion
is now completely opacified.

Mild branching nodularity in the medial LEFT lower lobe is favored a
small focus of infection.

No mediastinal adenopathy.

Aortic Atherosclerosis ([id number]).

## 2019-10-01 MED ORDER — AMLODIPINE BESYLATE 10 MG PO TABS
10.0000 mg | ORAL_TABLET | Freq: Every day | ORAL | Status: DC
  Administered 2019-10-01 – 2019-10-13 (×13): 10 mg via ORAL
  Filled 2019-10-01 (×13): qty 1

## 2019-10-01 MED ORDER — OXYCODONE HCL 5 MG PO TABS
5.0000 mg | ORAL_TABLET | Freq: Once | ORAL | Status: AC
  Administered 2019-10-01: 5 mg via ORAL
  Filled 2019-10-01: qty 1

## 2019-10-01 MED ORDER — CLONAZEPAM 0.5 MG PO TABS
1.0000 mg | ORAL_TABLET | Freq: Once | ORAL | Status: AC
  Administered 2019-10-01: 1 mg via ORAL
  Filled 2019-10-01: qty 2

## 2019-10-01 MED ORDER — FUROSEMIDE 10 MG/ML IJ SOLN
40.0000 mg | Freq: Once | INTRAMUSCULAR | Status: AC
  Administered 2019-10-01: 40 mg via INTRAVENOUS
  Filled 2019-10-01: qty 4

## 2019-10-01 NOTE — Plan of Care (Signed)
  Problem: Nutrition: Goal: Adequate nutrition will be maintained Outcome: Completed/Met   Problem: Elimination: Goal: Will not experience complications related to bowel motility Outcome: Completed/Met Goal: Will not experience complications related to urinary retention Outcome: Completed/Met   Problem: Skin Integrity: Goal: Risk for impaired skin integrity will decrease Outcome: Completed/Met   

## 2019-10-01 NOTE — Progress Notes (Signed)
PT Cancellation Note  Patient Details Name: Tyler Mcmillan MRN: [id number] DOB: 1945-10-01   Cancelled Treatment:    Reason Eval/Treat Not Completed: Other (comment) (Pt refused due to pain.  )   Denice Paradise 10/01/2019, 4:05 PM Shailee Foots W,PT Acute Rehabilitation Services Pager:  [phone]  Office:  [phone]

## 2019-10-01 NOTE — Progress Notes (Signed)
[facility]  PROGRESS NOTE  Tyler Mcmillan PPJ:[id number] DOB: Feb 10, 1945 DOA: 09/27/2019 PCP: [name], MD Admit date - 09/27/2019   Admitting Physician [name], MD  Outpatient Primary MD for the patient is [name], MD  LOS - 3 Brief Narrative   Tyler Mcmillan is a 74 y.o. year old male with medical history significant for alcohol abuse, non-small cell left lung cancer status post stereotactic radiotherapy, HTN, AAA repair who presented on 8/19 with intentional overdose of oxycodone for suicide attempt and was found to have acute hypoxic respiratory failure of multifactorial etiology related to opiate overdose, aspiration pneumonia requiring IV Zosyn and chronic left-sided pleural effusion.     Subjective  Feels like his legs have less "water in them". He still has his typical pain from his lung cancer. And reports SOB with minimal exertion ( ie going to the bathrom)  A & P   Acute hypoxic respiratory failure, improving.  In the setting of attempted suicide with opioid overdose likely further complicated by element of aspiration pneumonia in a patient with chronic left loculated pleural effusion.  Repeat chest x-ray shows consolidation at bases concerning for pneumonia, effusion remains stable on CT chest imaging also shows atelectasis. CT scan also mentions background of emphysema, but no current wheezing. Currently on 1 L down from 3 L -continueaugmentin -Wean O2 as able, SPO2 greater than 92%, ambulatory O2 testing -Monitor chronic effusion (in setting of known lung cancer), no current indication for thoracentesis given stable size -Encourage incentive spirometry,  flutter valve, schedule mucinex   Bibasilar pneumonia, likely aspiration pneumonia.  CT chest shows bibasilar disease. Likely combination of aspiration pneumonia and atlectasis. Some concern for aspiration pneumonia given overdose with opioid, would expect that to be on the right side.   Patient remains afebrile without white count and maintaining adequate O2 saturation on 1 L -Wean O2 as able -continue augmentin --Encourage incentive spirometry, flutter vlave -Monitor CBC  Bilateral lower extremity swelling secondary to decompensated Cirrhosis.  Patient states has been ongoing for a week, nonpitting. TTE and venous duplex negative. Cirrhosis mentioned on ct chest and confirmed on RUQ u.s. responded well to IV lasix with 1.9 L out in last 24 hours and peripheral edema improving --Repeat IV lasix 40 mg x 1 more day and assess output and swelling --supportive care with elevation of legs encouraged  Non-small cell lung cancer of left lower lobe (diagnosed 07/2016) with chronic left pleural effusion followed by Dr. [name].  Curative stereotactic radiotherapy to the left lung mass in September 2018.  In 2020 required hospitalization and placement of Pleurx catheter for drainage that was ultimately removed.  Last evaluated by CT surgery (Dr. [name]) on 08/29/2018 who did not recommend resection of lung mass given no evidence of progression of disease on past CT scan -CT chest shows stable lung mass and chronic effusion with only small right sided pleural effusion --monitor, wean o2 --medical oncology advises outpatient follow up after review of stable CT scan   Intentional opioid overdose with suicide attempt Admits to feeling like a burden to his stepson, social isolation. -Once medically stable psych recommends inpatient psych treatment -Currently IVC in place, discuss with case management -sitter in place -Continue suicide precautions  History of abdominal aortic aneurysm, repaired with stent graft August 2018  Alcohol abuse Reportedly drank a whole bottle of wine in addition to his opioids.  No current signs or symptoms of withdrawal -Monitor on CIWA protocol -Folic acid, multivitamin, thiamine --Emphasized  Etoh cessation  Chronic pain, stable Takes oxycodone IR 20 mg  every 6 hours, follows with pain management clinic, confirmed only provider for pain management on [place] controlled substance reporting database -Decrease oxycodone IR to 15 mg every 4 hours as needed in light of recent overdose, but this is to help with any pain management related to his malignancy --followed outpatient by pain management  Cirrhosis. Mentioned on CT chest. Patient does admit to alcohol abuse. wnl INR and plt. HCV neg. Likely cause of peripheral edema. Confirmed on RUQ [place].  --IV lasix 40 mg x1. Repeat and assess         Family Communication  : None  Code Status : Full code, discussed on day of admission  Disposition Plan  :  Patient is from home. Anticipated d/c date: 1 to 2 days. Barriers to d/c or necessity for inpatient status:  wean O2 as able for acute hypoxic respiratory failure, a, close monitoring for alcohol withdrawal, IV lasix Consults  : Psych  Procedures  : TTE, venous duplex  DVT Prophylaxis  :  Lovenox  Lab Results  Component Value Date   PLT 251 10/01/2019    Diet :  Diet Order            Diet Heart Room service appropriate? Yes; Fluid consistency: Thin  Diet effective now                  Inpatient Medications Scheduled Meds: . amLODipine  10 mg Oral Daily  . amoxicillin-clavulanate  1 tablet Oral Q12H  . docusate sodium  100 mg Oral BID  . enoxaparin (LOVENOX) injection  40 mg Subcutaneous Daily  . folic acid  1 mg Oral Daily  . furosemide  40 mg Intravenous Once  . guaiFENesin  600 mg Oral BID  . multivitamin with minerals  1 tablet Oral Daily  . nicotine  14 mg Transdermal Daily  . pantoprazole  40 mg Oral BID  . thiamine  100 mg Oral Daily   Or  . thiamine  100 mg Intravenous Daily   Continuous Infusions:  PRN Meds:.acetaminophen **OR** acetaminophen, bisacodyl, clonazePAM, hydrALAZINE, ondansetron **OR** ondansetron (ZOFRAN) IV, oxyCODONE, polyethylene glycol  Antibiotics  :   Anti-infectives (From admission,  onward)   Start     Dose/Rate Route Frequency Ordered Stop   09/29/19 1215  amoxicillin-clavulanate (AUGMENTIN) 875-125 MG per tablet 1 tablet        1 tablet Oral Every 12 hours 09/29/19 1123 10/04/19 0959   09/27/19 1130  piperacillin-tazobactam (ZOSYN) IVPB 3.375 g  Status:  Discontinued        3.375 g 12.5 mL/hr over 240 Minutes Intravenous Every 8 hours 09/27/19 0605 09/29/19 1123   09/27/19 0415  piperacillin-tazobactam (ZOSYN) IVPB 3.375 g        3.375 g 100 mL/hr over 30 Minutes Intravenous  Once 09/27/19 0408 09/27/19 0602       Objective   Vitals:   09/30/19 2017 09/30/19 2020 10/01/19 0500 10/01/19 0841  BP: (!) 166/78 (!) 166/78 (!) 161/83 (!) 153/71  Pulse: 84 78 83 81  Resp: 18  18 18   Temp: 99 F (37.2 C) 99 F (37.2 C) 99.2 F (37.3 C) 98.6 F (37 C)  TempSrc: Oral Oral Oral Oral  SpO2: 95% 95% 94% 93%  Weight:   85.1 kg   Height:        SpO2: 93 % O2 Flow Rate (L/min): 1 L/min  Wt Readings from Last 3  Encounters:  10/01/19 85.1 kg  05/16/18 73.5 kg  04/17/18 71.2 kg     Intake/Output Summary (Last 24 hours) at 10/01/2019 1019 Last data filed at 10/01/2019 0000 Gross per 24 hour  Intake 200 ml  Output 2100 ml  Net -1900 ml    Physical Exam:     Awake Alert, Oriented X 3, flat affect No new F.N deficits,  [place].AT, Normal respiratory effort on 1L [place], decreased breath sounds on left, occasional rhonchi, no wheezing,  RRR,No Gallops,Rubs or new Murmurs,  +ve B.Sounds, Abd Soft, No tenderness, No rebound, guarding or rigidity. Non-pitting edema of bilateral lower extremities to calves ( improved)   I have personally reviewed the following:   Data Reviewed:  CBC Recent Labs  Lab 09/27/19 0402 09/28/19 0457 09/29/19 0235 09/30/19 0627 10/01/19 0721  WBC 8.4 6.4 6.5 7.2 6.3  HGB 10.6* 9.1* 8.9* 9.7* 9.7*  HCT 39.1 32.1* 31.8* 33.4* 33.2*  PLT 298 258 250 269 251  MCV 85.7 85.4 83.7 83.5 82.4  MCH 23.2* 24.2* 23.4* 24.3* 24.1*  MCHC  27.1* 28.3* 28.0* 29.0* 29.2*  RDW 18.4* 18.3* 18.0* 17.8* 17.5*  LYMPHSABS 0.4* 0.5* 0.5* 0.7 0.5*  MONOABS 0.7 0.7 0.7 0.8 0.7  EOSABS 0.0 0.1 0.1 0.2 0.2  BASOSABS 0.0 0.0 0.0 0.0 0.0    Chemistries  Recent Labs  Lab 09/27/19 0402 09/28/19 0457 09/29/19 0235 10/01/19 0721  NA 136 140 139 136  K 4.7 4.1 4.0 3.3*  CL 100 100 101 98  CO2 26 29 30 28   GLUCOSE 113* 88 100* 106*  BUN 11 15 12 8   CREATININE 1.13 1.17 0.97 0.80  CALCIUM 8.7* 8.4* 8.3* 8.5*  AST 30  --   --  26  ALT 13  --   --  14  ALKPHOS 68  --   --  55  BILITOT 0.5  --   --  0.5   ------------------------------------------------------------------------------------------------------------------ No results for input(s): CHOL, HDL, LDLCALC, TRIG, CHOLHDL, LDLDIRECT in the last 72 hours.  No results found for: HGBA1C ------------------------------------------------------------------------------------------------------------------ No results for input(s): TSH, T4TOTAL, T3FREE, THYROIDAB in the last 72 hours.  Invalid input(s): FREET3 ------------------------------------------------------------------------------------------------------------------ No results for input(s): VITAMINB12, FOLATE, FERRITIN, TIBC, IRON, RETICCTPCT in the last 72 hours.  Coagulation profile Recent Labs  Lab 09/29/19 1222  INR 1.1    No results for input(s): DDIMER in the last 72 hours.  Cardiac Enzymes No results for input(s): CKMB, TROPONINI, MYOGLOBIN in the last 168 hours.  Invalid input(s): CK ------------------------------------------------------------------------------------------------------------------    Component Value Date/Time   BNP 609.5 (H) 09/27/2019 0403    Micro Results Recent Results (from the past 240 hour(s))  SARS Coronavirus 2 by RT PCR (hospital order, performed in [facility] hospital lab) Nasopharyngeal Nasopharyngeal Swab     Status: None   Collection Time: 09/27/19  4:18 AM   Specimen:  Nasopharyngeal Swab  Result Value Ref Range Status   SARS Coronavirus 2 NEGATIVE NEGATIVE Final    Comment: (NOTE) SARS-CoV-2 target nucleic acids are NOT DETECTED.  The SARS-CoV-2 RNA is generally detectable in upper and lower respiratory specimens during the acute phase of infection. The lowest concentration of SARS-CoV-2 viral copies this assay can detect is 250 copies / mL. A negative result does not preclude SARS-CoV-2 infection and should not be used as the sole basis for treatment or other patient management decisions.  A negative result may occur with improper specimen collection / handling, submission of specimen other than nasopharyngeal swab,  presence of viral mutation(s) within the areas targeted by this assay, and inadequate number of viral copies (<250 copies / mL). A negative result must be combined with clinical observations, patient history, and epidemiological information.  Fact Sheet for Patients:   StrictlyIdeas.no  Fact Sheet for Healthcare Providers: BankingDealers.co.za  This test is not yet approved or  cleared by the [place] FDA and has been authorized for detection and/or diagnosis of SARS-CoV-2 by FDA under an Emergency Use Authorization (EUA).  This EUA will remain in effect (meaning this test can be used) for the duration of the COVID-19 declaration under Section 564(b)(1) of the Act, 21 U.S.C. section 360bbb-3(b)(1), unless the authorization is terminated or revoked sooner.  Performed at [facility], [place] [address], [place], [place] [address]   Blood culture (routine x 2)     Status: None (Preliminary result)   Collection Time: 09/27/19  5:04 AM   Specimen: BLOOD RIGHT HAND  Result Value Ref Range Status   Specimen Description BLOOD RIGHT HAND  Final   Special Requests   Final    BOTTLES DRAWN AEROBIC AND ANAEROBIC Blood Culture results may not be optimal due to an inadequate volume of  blood received in culture bottles   Culture   Final    NO GROWTH 4 DAYS Performed at [facility], [place] [address], [place], [place] [address]    Report Status PENDING  Incomplete  Blood culture (routine x 2)     Status: None (Preliminary result)   Collection Time: 09/27/19  5:04 AM   Specimen: BLOOD LEFT HAND  Result Value Ref Range Status   Specimen Description BLOOD LEFT HAND  Final   Special Requests   Final    BOTTLES DRAWN AEROBIC AND ANAEROBIC Blood Culture results may not be optimal due to an inadequate volume of blood received in culture bottles   Culture   Final    NO GROWTH 4 DAYS Performed at [facility], [place] [address], [place], [place] [address]    Report Status PENDING  Incomplete    Radiology Reports DG Chest 2 View  Result Date: 09/28/2019 CLINICAL DATA:  Hypoxia EXAM: CHEST - 2 VIEW COMPARISON:  Chest radiograph 09/27/2019, CT chest 07/10/2018 FINDINGS: When accounting for differences in technique, likely similar loculated left pleural effusion. No discernible pneumothorax. New left basilar consolidation with silhouetting of the left hemidiaphragm. Similar chronic scarring and architectural distortion, most pronounced in the left upper lobe. Similar left lung volume loss with leftward shift of the mediastinum. Similar mild enlargement the cardiopericardial silhouette. Approximately 2.6 cm central cavitary mass in the left upper lobe, better characterized on prior CT chest from 07/10/2018 and consistent with known primary bronchogenic neoplasm. No acute osseous abnormality. Although suboptimally evaluated, grossly similar appearance of anterior wedging of multiple thoracic vertebral bodies and a chronic L1 compression fracture. IMPRESSION: 1. New left basilar consolidation, concerning for aspiration and/or pneumonia. 2. Loculated left pleural effusion, likely similar when accounting for differences in technique. 3. Redemonstrated left upper lobe mass, consistent  with known primary bronchogenic neoplasm.CT chest could better assess for progression, if indicated. Electronically Signed   By: Margaretha Sheffield MD   On: 09/28/2019 08:34   CT CHEST W CONTRAST  Result Date: 09/28/2019 CLINICAL DATA:  74 year old male with history of non-small cell lung cancer and pleural effusion. EXAM: CT CHEST WITH CONTRAST TECHNIQUE: Multidetector CT imaging of the chest was performed during intravenous contrast administration. CONTRAST:  66mL OMNIPAQUE IOHEXOL  300 MG/ML  SOLN COMPARISON:  Chest CT dated 07/10/2018. FINDINGS: Cardiovascular: There is no cardiomegaly or pericardial effusion. There is advanced 3 vessel coronary vascular calcification. There is severe atherosclerotic calcification of the thoracic aorta. No aneurysmal dilatation or dissection. Evaluation of the pulmonary arteries is limited due to suboptimal opacification of the peripheral branches. No central pulmonary artery embolus identified. Mediastinum/Nodes: No hilar adenopathy. Top-normal subcarinal lymph node measures 10 mm in short axis. The esophagus is grossly unremarkable. No mediastinal fluid collection. Lungs/Pleura: There is a 2.6 x 3.6 cm left upper lobe mass similar to prior CT in keeping with known malignancy. Areas of linear and reticular densities adjacent to the mass likely scarring and related to post treatment changes. A 2.1 x 1.4 cm nodular density along the left mediastinal pleural (49/3) likely scarring. Overall there is volume loss in the left upper lobe. There is background of emphysema. There are small bilateral pleural effusions. The right pleural effusion is new since the prior CT. There are bibasilar compressive atelectasis or pneumonia. There is no pneumothorax. The central airways are patent. Upper Abdomen: Cirrhosis. Small perihepatic free fluid. 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 Musculoskeletal: Osteopenia with  degenerative changes of the spine. L1 compression fracture, seen previously. No acute osseous pathology. IMPRESSION: 1. No CT evidence of central pulmonary artery embolus. 2. No significant interval change in the size of left upper lobe mass/malignancy and surrounding post radiation changes/scarring. 3. Small bilateral pleural effusions. The right pleural effusion is new since the prior CT. 4. Bibasilar compressive atelectasis or pneumonia. 5. Cirrhosis with small perihepatic free fluid. 6. Aortic Atherosclerosis (ICD10-I70.0) and Emphysema (ICD10-J43.9). Electronically Signed   By: Anner Crete M.D.   On: 09/28/2019 22:30   DG Chest Port 1 View  Result Date: 09/27/2019 CLINICAL DATA:  Suicide attempt, took 57 oxycodone and drink a bottle of wine EXAM: PORTABLE CHEST 1 VIEW COMPARISON:  CT 07/10/2018 FINDINGS: Chronic left pleural effusion resulting in some gradient opacity of the left lung reflecting layering fluid. Chronic scarring and architectural distortion changes in the left upper lung as well as a more masslike opacity likely corresponding to a site of prior malignancy and or treatment related change. Background of chronically coarsened interstitial changes throughout both lungs. No right effusion. No visible pneumothorax. The aorta is calcified. The remaining cardiomediastinal contours are unremarkable. Stable mild cardiomegaly. No acute osseous or soft tissue abnormality. Degenerative changes are present in the imaged spine and shoulders. Telemetry leads overlie the chest. IMPRESSION: 1. Chronic left pleural effusion, not significantly changed from prior counting for differences in technique and inflation. 2. Low volumes and atelectasis. 3. Chronic scarring and architectural distortion changes in the left upper lung 4. Masslike opacity likely corresponding to a site of prior malignancy and or treatment related change in the left upper lobe. 5. Additional chronic coarsened interstitial changes. 6.  Stable cardiomegaly. 7.  Aortic Atherosclerosis (ICD10-I70.0). Electronically Signed   By: Lovena Le M.D.   On: 09/27/2019 04:40   ECHOCARDIOGRAM COMPLETE  Result Date: 09/28/2019    ECHOCARDIOGRAM REPORT   Patient Name:   BENEDICT KUE Date of Exam: 09/28/2019 Medical Rec #:  [id number]             Height:       70.0 in Accession #:    [id number]            Weight:       198.6 lb Date of Birth:  03-23-1945             BSA:          2.081 m Patient Age:    58 years              BP:           133/61 mmHg Patient Gender: M                     HR:           89 bpm. Exam Location:  Inpatient Procedure: 2D Echo, Cardiac Doppler, Color Doppler and Intracardiac            Opacification Agent Indications:    Peripheral edema  History:        Patient has prior history of Echocardiogram examinations, most                 recent 04/03/2018. Risk Factors:Hypertension and Current Smoker.                 Acute respiratory falure with hypoxia. ETOH abuse.  Sonographer:    Clayton Lefort RDCS (AE) Referring Phys: [id number] [place]  1. Left ventricular ejection fraction, by estimation, is 60 to 65%. The left ventricle has normal function. The left ventricle has no regional wall motion abnormalities. There is severe asymmetric left ventricular hypertrophy of the basal-septal and mild concentric LVH. Left ventricular diastolic parameters were normal.  2. Right ventricular systolic function is normal. The right ventricular size is normal. Tricuspid regurgitation signal is inadequate for assessing PA pressure.  3. The mitral valve is grossly normal. Trivial mitral valve regurgitation. No evidence of mitral stenosis.  4. The aortic valve is abnormal. Aortic valve regurgitation is not visualized. Mild aortic valve sclerosis is present, with no evidence of aortic valve stenosis.  5. The inferior vena cava is dilated in size with <50% respiratory variability, suggesting right atrial pressure of 15 mmHg.  6. Aortic  dilatation noted. There is borderline dilatation of the aortic root measuring 39 mm. FINDINGS  Left Ventricle: Left ventricular ejection fraction, by estimation, is 60 to 65%. The left ventricle has normal function. The left ventricle has no regional wall motion abnormalities. Definity contrast agent was given IV to delineate the left ventricular  endocardial borders. The left ventricular internal cavity size was normal in size. There is severe asymmetric left ventricular hypertrophy of the basal-septal and mild concentric LVH segments. Left ventricular diastolic parameters were normal. Right Ventricle: The right ventricular size is normal. No increase in right ventricular wall thickness. Right ventricular systolic function is normal. Tricuspid regurgitation signal is inadequate for assessing PA pressure. Left Atrium: Left atrial size was normal in size. Right Atrium: Right atrial size was normal in size. Pericardium: There is no evidence of pericardial effusion. Mitral Valve: The mitral valve is grossly normal. Normal mobility of the mitral valve leaflets. Trivial mitral valve regurgitation. No evidence of mitral valve stenosis. MV peak gradient, 6.6 mmHg. The mean mitral valve gradient is 3.0 mmHg with average heart rate of 88 bpm. Tricuspid Valve: The tricuspid valve is grossly normal. Tricuspid valve regurgitation is trivial. No evidence of tricuspid stenosis. Aortic Valve: The aortic valve is abnormal. Aortic valve regurgitation is not visualized. Mild aortic valve sclerosis is present, with no evidence of aortic valve stenosis. There is mild calcification of the aortic valve. Aortic valve mean gradient measures 5.0 mmHg. Aortic valve peak gradient measures 8.1 mmHg. Aortic valve area,  by VTI measures 3.50 cm. Pulmonic Valve: The pulmonic valve was not well visualized. Pulmonic valve regurgitation is not visualized. No evidence of pulmonic stenosis. Aorta: Aortic dilatation noted. There is borderline  dilatation of the aortic root measuring 39 mm. Venous: The inferior vena cava is dilated in size with less than 50% respiratory variability, suggesting right atrial pressure of 15 mmHg. IAS/Shunts: No atrial level shunt detected by color flow Doppler.  LEFT VENTRICLE PLAX 2D LVIDd:         4.00 cm  Diastology LVIDs:         2.60 cm  LV e' lateral:   8.38 cm/s LV PW:         1.50 cm  LV E/e' lateral: 12.9 LV IVS:        2.10 cm  LV e' medial:    7.62 cm/s LVOT diam:     2.40 cm  LV E/e' medial:  14.2 LV SV:         105 LV SV Index:   51 LVOT Area:     4.52 cm  RIGHT VENTRICLE             IVC RV Basal diam:  4.70 cm     IVC diam: 2.40 cm RV Mid diam:    4.30 cm RV S prime:     12.60 cm/s TAPSE (M-mode): 2.0 cm LEFT ATRIUM              Index       RIGHT ATRIUM           Index LA diam:        4.00 cm  1.92 cm/m  RA Area:     22.20 cm LA Vol (A2C):   101.0 ml 48.53 ml/m RA Volume:   63.90 ml  30.70 ml/m LA Vol (A4C):   34.9 ml  16.77 ml/m LA Biplane Vol: 60.2 ml  28.93 ml/m  AORTIC VALVE AV Area (Vmax):    3.73 cm AV Area (Vmean):   3.67 cm AV Area (VTI):     3.50 cm AV Vmax:           142.00 cm/s AV Vmean:          107.000 cm/s AV VTI:            0.301 m AV Peak Grad:      8.1 mmHg AV Mean Grad:      5.0 mmHg LVOT Vmax:         117.00 cm/s LVOT Vmean:        86.800 cm/s LVOT VTI:          0.233 m LVOT/AV VTI ratio: 0.77  AORTA Ao Root diam: 3.90 cm Ao Asc diam:  3.80 cm MITRAL VALVE MV Area (PHT): 3.27 cm     SHUNTS MV Peak grad:  6.6 mmHg     Systemic VTI:  0.23 m MV Mean grad:  3.0 mmHg     Systemic Diam: 2.40 cm MV Vmax:       1.28 m/s MV Vmean:      79.1 cm/s MV Decel Time: 232 msec MV E velocity: 108.00 cm/s MV A velocity: 93.80 cm/s MV E/A ratio:  1.15 Cherlynn Kaiser MD Electronically signed by Cherlynn Kaiser MD Signature Date/Time: 09/28/2019/5:00:44 PM    Final    VAS [place] LOWER EXTREMITY VENOUS (DVT)  Result Date: 09/29/2019  Lower Venous DVTStudy Indications: Swelling, and Pain.  Risk Factors:  Cancer History of lung cancer.  Comparison Study: Prior study 11-17-2016 Bilateral LEV was WNL Performing Technologist: Darlin Coco  Examination Guidelines: A complete evaluation includes B-mode imaging, spectral Doppler, color Doppler, and power Doppler as needed of all accessible portions of each vessel. Bilateral testing is considered an integral part of a complete examination. Limited examinations for reoccurring indications may be performed as noted. The reflux portion of the exam is performed with the patient in reverse Trendelenburg.  +---------+---------------+---------+-----------+----------+--------------+ RIGHT    CompressibilityPhasicitySpontaneityPropertiesThrombus Aging +---------+---------------+---------+-----------+----------+--------------+ CFV      Full           Yes      Yes                                 +---------+---------------+---------+-----------+----------+--------------+ SFJ      Full                                                        +---------+---------------+---------+-----------+----------+--------------+ FV Prox  Full                                                        +---------+---------------+---------+-----------+----------+--------------+ FV Mid   Full                                                        +---------+---------------+---------+-----------+----------+--------------+ FV DistalFull                                                        +---------+---------------+---------+-----------+----------+--------------+ PFV      Full                                                        +---------+---------------+---------+-----------+----------+--------------+ POP      Full           Yes      Yes                                 +---------+---------------+---------+-----------+----------+--------------+ PTV      Full                                                         +---------+---------------+---------+-----------+----------+--------------+ PERO     Full                                                        +---------+---------------+---------+-----------+----------+--------------+   +---------+---------------+---------+-----------+----------+--------------+  LEFT     CompressibilityPhasicitySpontaneityPropertiesThrombus Aging +---------+---------------+---------+-----------+----------+--------------+ CFV      Full           Yes      Yes                                 +---------+---------------+---------+-----------+----------+--------------+ SFJ      Full                                                        +---------+---------------+---------+-----------+----------+--------------+ FV Prox  Full                                                        +---------+---------------+---------+-----------+----------+--------------+ FV Mid   Full                                                        +---------+---------------+---------+-----------+----------+--------------+ FV DistalFull                                                        +---------+---------------+---------+-----------+----------+--------------+ PFV      Full                                                        +---------+---------------+---------+-----------+----------+--------------+ POP      Full           Yes      Yes                                 +---------+---------------+---------+-----------+----------+--------------+ PTV      Full                                                        +---------+---------------+---------+-----------+----------+--------------+ PERO     Full                                                        +---------+---------------+---------+-----------+----------+--------------+     Summary: RIGHT: - There is no evidence of deep vein thrombosis in the lower extremity.  - No cystic structure found in  the popliteal fossa.  LEFT: - There is no evidence of deep vein thrombosis in the lower extremity.  - No  cystic structure found in the popliteal fossa.  *See table(s) above for measurements and observations. Electronically signed by Deitra Mayo MD on 09/29/2019 at 6:38:00 AM.    Final    US Abdomen Limited RUQ  Result Date: 09/30/2019 CLINICAL DATA:  74 year old male with cirrhosis and ascites. EXAM: ULTRASOUND ABDOMEN LIMITED RIGHT UPPER QUADRANT COMPARISON:  03/30/2018 CT and 07/31/2012 ultrasound FINDINGS: Gallbladder: Multiple small mobile gallstones are noted, the largest measuring 4 mm. There is no evidence of gallbladder wall thickening, pericholecystic fluid or sonographic Murphy sign. Common bile duct: Diameter: 4 mm.  No intrahepatic or extrahepatic biliary dilatation. Liver: Slight nodularity of the hepatic contour is noted, compatible with this patient's known cirrhosis. No focal hepatic abnormalities or masses are identified. Portal vein is patent on color Doppler imaging with normal direction of blood flow towards the liver. Other: None. IMPRESSION: 1. Cholelithiasis without evidence of acute cholecystitis. No biliary dilatation. 2. Cirrhosis. No focal hepatic mass identified. Electronically Signed   By: Margarette Canada M.D.   On: 09/30/2019 17:04     Time Spent in minutes  30     [name] M.D on 10/01/2019 at 10:19 AM  To page go to [email or web address] - password [facility]

## 2019-10-01 NOTE — Progress Notes (Signed)
Brief oncology note:  Tyler Mcmillan was admitted with acute hypoxic respiratory failure due to opiate overdose, aspiration pneumonia, and chronic left-sided pleural effusion.  The patient has been followed by oncology for a stage Ib non-small cell lung cancer, squamous cell carcinoma.  He is status post curative SBRT to his left lung mass in September 2018.  He has been on observation.  His last visit with medical oncology was on 07/17/2018.  At that time, he had a CT scan which showed no findings concerning for disease progression.  At that visit, he was referred back to Dr. Servando Snare for discussion of surgical options for his left upper lobe lesion and he was advised to follow-up with medical oncology in 3 months with a repeat CT scan.  The patient had a PET scan per Dr. Servando Snare on 08/28/2018.  This PET scan was reviewed with the patient per Dr. Servando Snare and did not demonstrate any evidence of progression of disease, metastatic malignancy, and that the primary treated mass appeared to be controlled by the prior radiation.  Dr. Servando Snare recommended against resection due to his underlying medical conditions and respiratory function.  The patient was advised to follow-up with a repeat CT scan.  He has not followed up.  The patient CT scan from this admission has been reviewed.  His CT scan shows no evidence of disease progression.  Recommend outpatient follow-up with medical oncology for routine surveillance.  Medical oncology will sign off at this time.  Please call if there are additional questions.  Mikey Bussing, DNP, AGPCNP-BC, AOCNP Mon/Tues/Thurs/Fri 7am-5pm; Off Wednesdays Cell: [phone]

## 2019-10-02 ENCOUNTER — Inpatient Hospital Stay (HOSPITAL_COMMUNITY): Payer: Medicare Other

## 2019-10-02 DIAGNOSIS — E876 Hypokalemia: Secondary | ICD-10-CM

## 2019-10-02 LAB — CULTURE, BLOOD (ROUTINE X 2)
Culture: NO GROWTH
Culture: NO GROWTH

## 2019-10-02 LAB — COMPREHENSIVE METABOLIC PANEL
ALT: 16 U/L (ref 0–44)
AST: 30 U/L (ref 15–41)
Albumin: 2.8 g/dL — ABNORMAL LOW (ref 3.5–5.0)
Alkaline Phosphatase: 57 U/L (ref 38–126)
Anion gap: 10 (ref 5–15)
BUN: 10 mg/dL (ref 8–23)
CO2: 30 mmol/L (ref 22–32)
Calcium: 8.7 mg/dL — ABNORMAL LOW (ref 8.9–10.3)
Chloride: 97 mmol/L — ABNORMAL LOW (ref 98–111)
Creatinine, Ser: 0.78 mg/dL (ref 0.61–1.24)
GFR calc Af Amer: >60 mL/min (ref >60)
GFR calc non Af Amer: >60 mL/min (ref >60)
Glucose, Bld: 109 mg/dL — ABNORMAL HIGH (ref 70–99)
Potassium: 3.3 mmol/L — ABNORMAL LOW (ref 3.5–5.1)
Sodium: 137 mmol/L (ref 135–145)
Total Bilirubin: 0.4 mg/dL (ref 0.3–1.2)
Total Protein: 6.8 g/dL (ref 6.5–8.1)

## 2019-10-02 LAB — MAGNESIUM: Magnesium: 1.7 mg/dL (ref 1.7–2.4)

## 2019-10-02 MED ORDER — FUROSEMIDE 10 MG/ML IJ SOLN
40.0000 mg | Freq: Once | INTRAMUSCULAR | Status: AC
  Administered 2019-10-02: 40 mg via INTRAVENOUS
  Filled 2019-10-02: qty 4

## 2019-10-02 MED ORDER — POTASSIUM CHLORIDE CRYS ER 20 MEQ PO TBCR
40.0000 meq | EXTENDED_RELEASE_TABLET | ORAL | Status: AC
  Administered 2019-10-02 (×2): 40 meq via ORAL
  Filled 2019-10-02 (×2): qty 2

## 2019-10-02 NOTE — Consult Note (Signed)
[place] Psychiatry Consult   Reason for Consult:  SUicide Attempt by Overdose Referring Physician:  Dr. Lonny Prude Patient Identification: Tyler Mcmillan MRN:  [id number] Principal Diagnosis: Acute respiratory failure with hypoxia [facility]) Diagnosis:  Principal Problem:   Acute respiratory failure with hypoxia ([place]) Active Problems:   AAA (abdominal aortic aneurysm) ([place])   Hypertensive heart disease without heart failure   Malignant neoplasm of bronchus of left upper lobe ([name])   Peripheral edema   Pleural effusion, left   Intentional overdose of drug in tablet form ([place])   Alcohol abuse   Tobacco dependence   Aspiration pneumonia ([place])   Other cirrhosis of liver ([place])   Total Time spent with patient: 30 minutes  Subjective:   Tyler Mcmillan is a 74 y.o. male patient admitted with suicide attempt status post intentional overdose.  As per patient" in my opinion my wife died in Aug 02, 2022, and she was the only thing I had to leave for.  My quality of life has went down since I have cancer.  I can't do much.  I took the pills to not wake up.  I'm meditated on it for quite some time, and will do something I just jumped into.  I actually did see and ponder on it and think about it before I did it.  I then thought about if I needed to contact 911, and came to the conclusion that I should call them.  I think it was a very selfish act of me, said that it didn't work."  He does report his most recent triggers is his wife passing away, she was the sole caregiver.  He denies any previous suicide attempt.Patient becomes verbally irritated when inquiring about suicide attempt. "  I did not attempt to hurt myself.  Stop saying that word.  I consider more euthanize.  Countries spend millions of dollars each year keeping people alive they want to be dead.  We should start practicing them more here in the [place].  But I did not harm myself.  I took the meds to not wake up.  It didn't work so  I guess I will try it again.  I will most likely go to a nursing home from here. "  Patient currently minimizes his suicide attempt, and currently denies any active suicidal ideations.    HPI:  Pt is a 74 y.o. male with PMH significant of SCC of left lung (2018); HTN; anxiety d/o; bladder cancer; and AAA presenting with intentional overdose. Admitted for hypoxic respiratory failure could be from edema versus aspiration. Of note: Wife died 08-12-2022, and was his caregiver and helper.  Past Psychiatric History: Anxiety, depression, PTSD.  Reports multiple trials of oral antidepressant medication, all of which were ineffective.  He is unable to recall the names of these particular medications.  He denies any current substance use, and or legal charges.  He denies any history of suicide attempts.  Risk to Self:   Risk to Others:   Prior Inpatient Therapy:   Prior Outpatient Therapy:    Past Medical History:  Past Medical History:  Diagnosis Date  . AAA (abdominal aortic aneurysm) without rupture ([place])    [place] ABD.  DONE  JUNE 2014  3.5  . Bladder cancer ([place])   . Chronic anxiety   . Chronic pain   . GERD (gastroesophageal reflux disease)   . Hypertension   . Stage I squamous cell carcinoma of left lung ([place]) 08/09/2016  Past Surgical History:  Procedure Laterality Date  . ABDOMINAL AORTIC ENDOVASCULAR STENT GRAFT N/A 09/08/2016   Procedure: ABDOMINAL AORTIC ENDOVASCULAR STENT GRAFT;  Surgeon: [name], MD;  Location: [facility];  Service: Vascular;  Laterality: N/A;  . BIOPSY  03/31/2018   Procedure: BIOPSY;  Surgeon: Wonda Horner, MD;  Location: [facility] ENDOSCOPY;  Service: Endoscopy;;  . CHEST TUBE INSERTION Left 04/07/2018   Procedure: INSERTION PLEURAL DRAINAGE CATHETER USING ULTRASOUND AND FLURO GUIDANCE;  Surgeon: Grace Isaac, MD;  Location: [facility];  Service: Thoracic;  Laterality: Left;  . CYSTOSCOPY W/ URETERAL STENT PLACEMENT Left 10/04/2012   Procedure: CYSTOSCOPY WITH RETROGRADE  PYELOGRAM/URETERAL STENT PLACEMENT   "POSSIBLE LEFT STENT";  Surgeon: Alexis Frock, MD;  Location[facility];  Service: Urology;  Laterality: Left;  . ESOPHAGOGASTRODUODENOSCOPY (EGD) WITH PROPOFOL N/A 03/31/2018   Procedure: ESOPHAGOGASTRODUODENOSCOPY (EGD) WITH PROPOFOL;  Surgeon: Wonda Horner, MD;  Location: [facility] ENDOSCOPY;  Service: Endoscopy;  Laterality: N/A;  . IR THORACENTESIS ASP PLEURAL SPACE W/IMG GUIDE  03/31/2018  . IR THORACENTESIS ASP PLEURAL SPACE W/IMG GUIDE  04/03/2018  . IR THORACENTESIS ASP PLEURAL SPACE W/IMG GUIDE  04/04/2018  . LEFT HEART CATH AND CORONARY ANGIOGRAPHY N/A 07/02/2016   Procedure: Left Heart Cath and Coronary Angiography;  Surgeon: Martinique, Peter M, MD;  Location: [facility] CV LAB;  Service: Cardiovascular;  Laterality: N/A;  . MEATOTOMY N/A 10/04/2012   Procedure: MEATOTOMY ADULT;  Surgeon: Alexis Frock, MD;  [facility];  Service: Urology;  Laterality: N/A;  . REMOVAL OF PLEURAL DRAINAGE CATHETER Left 05/24/2018   Procedure: REMOVAL OF PLEURAL DRAINAGE CATHETER;  Surgeon: Grace Isaac, MD;  Location: [facility];  Service: Thoracic;  Laterality: Left;  . TRANSURETHRAL RESECTION OF BLADDER TUMOR WITH GYRUS (TURBT-GYRUS) N/A 10/04/2012   Procedure: TRANSURETHRAL RESECTION OF BLADDER TUMOR WITH GYRUS (TURBT-GYRUS);  Surgeon: Alexis Frock, MD;  Location[facility];  Service: Urology;  Laterality: N/A;  . VIDEO BRONCHOSCOPY Bilateral 07/19/2016   Procedure: VIDEO BRONCHOSCOPY WITH FLUORO;  Surgeon: Marshell Garfinkel, MD;  Location: [facility] ENDOSCOPY;  Service: Cardiopulmonary;  Laterality: Bilateral;   Family History:  Family History  Problem Relation Age of Onset  . Cancer Mother   . Heart disease Father        before age 15  . Heart attack Father   . Cancer Brother    Family Psychiatric  History: Denies  Social History:  Social History   Substance and Sexual Activity  Alcohol Use Yes  .  Alcohol/week: 24.0 standard drinks  . Types: 10 Glasses of wine, 14 Cans of beer per week   Comment: few glases wine per day, beer     Social History   Substance and Sexual Activity  Drug Use No    Social History   Socioeconomic History  . Marital status: Married    Spouse name: Not on file  . Number of children: Not on file  . Years of education: Not on file  . Highest education level: Not on file  Occupational History  . Not on file  Tobacco Use  . Smoking status: Current Every Day Smoker    Packs/day: 0.50    Years: 50.00    Pack years: 25.00    Types: Cigarettes  . Smokeless tobacco: Never Used  . Tobacco comment: pt refuses info  Vaping Use  . Vaping Use: Never used  Substance and Sexual Activity  . Alcohol use: Yes  Alcohol/week: 24.0 standard drinks    Types: 10 Glasses of wine, 14 Cans of beer per week    Comment: few glases wine per day, beer  . Drug use: No  . Sexual activity: Not on file  Other Topics Concern  . Not on file  Social History Narrative  . Not on file   Social Determinants of Health   Financial Resource Strain:   . Difficulty of Paying Living Expenses: Not on file  Food Insecurity:   . Worried About Charity fundraiser in the Last Year: Not on file  . Ran Out of Food in the Last Year: Not on file  Transportation Needs:   . Lack of Transportation (Medical): Not on file  . Lack of Transportation (Non-Medical): Not on file  Physical Activity:   . Days of Exercise per Week: Not on file  . Minutes of Exercise per Session: Not on file  Stress:   . Feeling of Stress : Not on file  Social Connections:   . Frequency of Communication with Friends and Family: Not on file  . Frequency of Social Gatherings with Friends and Family: Not on file  . Attends Religious Services: Not on file  . Active Member of Clubs or Organizations: Not on file  . Attends Archivist Meetings: Not on file  . Marital Status: Not on file   Additional  Social History:    Allergies:  No Known Allergies  Labs:  Results for orders placed or performed during the hospital encounter of 09/27/19 (from the past 48 hour(s))  CBC with Differential/Platelet     Status: Abnormal   Collection Time: 10/01/19  7:21 AM  Result Value Ref Range   WBC 6.3 4.0 - 10.5 K/uL   RBC 4.03 (L) 4.22 - 5.81 MIL/uL   Hemoglobin 9.7 (L) 13.0 - 17.0 g/dL   HCT 33.2 (L) 39 - 52 %   MCV 82.4 80.0 - 100.0 fL   MCH 24.1 (L) 26.0 - 34.0 pg   MCHC 29.2 (L) 30.0 - 36.0 g/dL   RDW 17.5 (H) 11.5 - 15.5 %   Platelets 251 150 - 400 K/uL   nRBC 0.0 0.0 - 0.2 %   Neutrophils Relative % 76 %   Neutro Abs 4.8 1.7 - 7.7 K/uL   Lymphocytes Relative 8 %   Lymphs Abs 0.5 (L) 0.7 - 4.0 K/uL   Monocytes Relative 11 %   Monocytes Absolute 0.7 0 - 1 K/uL   Eosinophils Relative 4 %   Eosinophils Absolute 0.2 0 - 0 K/uL   Basophils Relative 1 %   Basophils Absolute 0.0 0 - 0 K/uL   Immature Granulocytes 0 %   Abs Immature Granulocytes 0.02 0.00 - 0.07 K/uL    Comment: Performed at [facility], [address], [place], [place] [address]  Comprehensive metabolic panel     Status: Abnormal   Collection Time: 10/01/19  7:21 AM  Result Value Ref Range   Sodium 136 135 - 145 mmol/L   Potassium 3.3 (L) 3.5 - 5.1 mmol/L   Chloride 98 98 - 111 mmol/L   CO2 28 22 - 32 mmol/L   Glucose, Bld 106 (H) 70 - 99 mg/dL    Comment: Glucose reference range applies only to samples taken after fasting for at least 8 hours.   BUN 8 8 - 23 mg/dL   Creatinine, Ser 0.80 0.61 - 1.24 mg/dL   Calcium 8.5 (L) 8.9 - 10.3 mg/dL  Total Protein 6.6 6.5 - 8.1 g/dL   Albumin 2.7 (L) 3.5 - 5.0 g/dL   AST 26 15 - 41 U/L   ALT 14 0 - 44 U/L   Alkaline Phosphatase 55 38 - 126 U/L   Total Bilirubin 0.5 0.3 - 1.2 mg/dL   GFR calc non Af Amer >60 >60 mL/min   GFR calc Af Amer >60 >60 mL/min   Anion gap 10 5 - 15    Comment: Performed at [facility] [address] [place], [place] [address]   Comprehensive metabolic panel     Status: Abnormal   Collection Time: 10/02/19  5:26 AM  Result Value Ref Range   Sodium 137 135 - 145 mmol/L   Potassium 3.3 (L) 3.5 - 5.1 mmol/L   Chloride 97 (L) 98 - 111 mmol/L   CO2 30 22 - 32 mmol/L   Glucose, Bld 109 (H) 70 - 99 mg/dL    Comment: Glucose reference range applies only to samples taken after fasting for at least 8 hours.   BUN 10 8 - 23 mg/dL   Creatinine, Ser 0.78 0.61 - 1.24 mg/dL   Calcium 8.7 (L) 8.9 - 10.3 mg/dL   Total Protein 6.8 6.5 - 8.1 g/dL   Albumin 2.8 (L) 3.5 - 5.0 g/dL   AST 30 15 - 41 U/L   ALT 16 0 - 44 U/L   Alkaline Phosphatase 57 38 - 126 U/L   Total Bilirubin 0.4 0.3 - 1.2 mg/dL   GFR calc non Af Amer >60 >60 mL/min   GFR calc Af Amer >60 >60 mL/min   Anion gap 10 5 - 15    Comment: Performed at [facility], [place] [address], [place], [place] [address]  Magnesium     Status: None   Collection Time: 10/02/19  9:48 AM  Result Value Ref Range   Magnesium 1.7 1.7 - 2.4 mg/dL    Comment: Performed at [facility] [address], [place], [place] [address]    Current Facility-Administered Medications  Medication Dose Route Frequency Provider Last Rate Last Admin  . acetaminophen (TYLENOL) tablet 650 mg  650 mg Oral Q6H PRN Karmen Bongo, MD   650 mg at 09/30/19 1328   Or  . acetaminophen (TYLENOL) suppository 650 mg  650 mg Rectal Q6H PRN Karmen Bongo, MD      . amLODipine (NORVASC) tablet 10 mg  10 mg Oral Daily Oretha Milch D, MD   10 mg at 10/02/19 0945  . amoxicillin-clavulanate (AUGMENTIN) 875-125 MG per tablet 1 tablet  1 tablet Oral Q12H Oretha Milch D, MD   1 tablet at 10/02/19 0943  . bisacodyl (DULCOLAX) EC tablet 5 mg  5 mg Oral Daily PRN Karmen Bongo, MD   5 mg at 10/02/19 0424  . clonazePAM (KLONOPIN) tablet 1 mg  1 mg Oral QHS PRN Karmen Bongo, MD   1 mg at 10/02/19 0111  . docusate sodium (COLACE) capsule 100 mg  100 mg Oral BID Karmen Bongo, MD   100 mg at 10/02/19  0945  . enoxaparin (LOVENOX) injection 40 mg  40 mg Subcutaneous Daily Karmen Bongo, MD   40 mg at 10/02/19 1013  . folic acid (FOLVITE) tablet 1 mg  1 mg Oral Daily Karmen Bongo, MD   1 mg at 10/02/19 0945  . guaiFENesin (MUCINEX) 12 hr tablet 600 mg  600 mg Oral BID Oretha Milch D, MD   600 mg at 10/02/19  0945  . hydrALAZINE (APRESOLINE) injection 5 mg  5 mg Intravenous Q4H PRN Karmen Bongo, MD      . multivitamin with minerals tablet 1 tablet  1 tablet Oral Daily Karmen Bongo, MD   1 tablet at 10/02/19 0945  . nicotine (NICODERM CQ - dosed in mg/24 hours) patch 14 mg  14 mg Transdermal Daily Karmen Bongo, MD   14 mg at 10/02/19 1015  . ondansetron (ZOFRAN) tablet 4 mg  4 mg Oral Q6H PRN Karmen Bongo, MD       Or  . ondansetron [facility]) injection 4 mg  4 mg Intravenous Q6H PRN Karmen Bongo, MD      . oxyCODONE (Oxy IR/ROXICODONE) immediate release tablet 15 mg  15 mg Oral Q4H PRN Oretha Milch D, MD   15 mg at 10/02/19 1357  . pantoprazole (PROTONIX) EC tablet 40 mg  40 mg Oral BID Karmen Bongo, MD   40 mg at 10/02/19 0944  . polyethylene glycol (MIRALAX / GLYCOLAX) packet 17 g  17 g Oral Daily PRN Karmen Bongo, MD   17 g at 10/02/19 0115  . thiamine tablet 100 mg  100 mg Oral Daily Karmen Bongo, MD   100 mg at 10/02/19 2130   Or  . thiamine (B-1) injection 100 mg  100 mg Intravenous Daily Karmen Bongo, MD        Musculoskeletal: Strength & Muscle Tone: within normal limits Gait & Station: normal Patient leans: N/A  Psychiatric Specialty Exam: Physical Exam  Review of Systems  Blood pressure 140/67, pulse 85, temperature 99.2 F (37.3 C), temperature source Oral, resp. rate 20, height 5\' 10"  (1.778 m), weight 82.7 kg, SpO2 95 %.Body mass index is 26.16 kg/m.  General Appearance: Casual and wearing hospital gown  Eye Contact:  Fair  Speech:  Clear and Coherent and Normal Rate  Volume:  Normal  Mood:  Depressed and Dysphoric  Affect:  Depressed   Thought Process:  Coherent, Linear and Descriptions of Associations: Intact  Orientation:  Full (Time, Place, and Person)  Thought Content:  Logical  Suicidal Thoughts:  Yes.  without intent/plan  Homicidal Thoughts:  No  Memory:  Immediate;   Fair Recent;   Fair Remote;   Fair  Judgement:  Poor  Insight:  Shallow  Psychomotor Activity:  Psychomotor Retardation  Concentration:  Concentration: Fair and Attention Span: Fair  Recall:  [employer] of Knowledge:  Fair  Language:  Fair  Akathisia:  No  Handed:  Right  AIMS (if indicated):     Assets:  Agricultural consultant Leisure Time Social Support Transportation  ADL's:  Intact  Cognition:  WNL  Sleep:        Treatment Plan Summary: Plan Patient declines oral antidepressants. He reports they have never worked for him. If he is open to medication we can start prozac 20mg  po daily for depression, anxiety, and suicidal thoughts.  -If he agrees may start Prozac 20mg  po daily.  - Geropsych admission - Patient will benefit from bereavement therapy or chaplain as his wife recently died.  -continue 1:1 suicide sitter - Recommend IVC patient if he attempts or expresses wishes to leave. At this time he is agreeable to this plan, and contracts for his safety.   Disposition: Recommend psychiatric Inpatient admission when medically cleared. Recommend working closely with Education officer, museum to get him placed in [facility] facility.   Suella Broad, FNP 10/02/2019 2:35 PM

## 2019-10-02 NOTE — Progress Notes (Signed)
SATURATION QUALIFICATIONS: (This note is used to comply with regulatory documentation for home oxygen)  Patient Saturations on Room Air at Rest = 90%  Patient Saturations on Room Air while Ambulating = 85%  Patient Saturations on 2 Liters of oxygen while Ambulating = 89%  Please briefly explain why patient needs home oxygen:Pt needs 2L with activity to maintain sats >88%.   Tyler Mcmillan,PT Acute Rehabilitation Services Pager:  [phone]  Office:  [phone]

## 2019-10-02 NOTE — Progress Notes (Signed)
Physical Therapy Treatment Patient Details Name: Tyler Mcmillan MRN: [id number] DOB: 1945/09/14 Today's Date: 10/02/2019    History of Present Illness Pt is a 74 y.o. male with PMH significant of SCC of left lung (2018); HTN; anxiety d/o; bladder cancer; and AAA presenting with intentional overdose. Admitted for hypoxic respiratory failure could be from edema versus aspiration. Of note: Wife died Aug 27, 2022, and was his caregiver and helper.     PT Comments    Pt admitted with above diagnosis. Pt was able to ambulate with RW with min guard assist and cues.  Pt without LOB but some cues needed with turns with RW. Pt also desat on RA with activity to 85% therefore needed 2LO2 with activity to keep sat >89%.  Pt currently with functional limitations due to the deficits listed below (see PT Problem List). Pt will benefit from skilled PT to increase their independence and safety with mobility to allow discharge to the venue listed below.     Follow Up Recommendations  Home health PT (Behavioral health is the plan)     Equipment Recommendations  Other (comment) (may need O2)    Recommendations for Other Services       Precautions / Restrictions Precautions Precautions: Fall;Other (comment) (Suicide) Restrictions Weight Bearing Restrictions: No    Mobility  Bed Mobility Overal bed mobility: Needs Assistance Bed Mobility: Supine to Sit     Supine to sit: Min assist     General bed mobility comments: a little assist to come to eOB  Transfers Overall transfer level: Needs assistance Equipment used: Rolling walker (2 wheeled) Transfers: Sit to/from [facility] Sit to Stand: Min guard;Min assist;From elevated surface         General transfer comment: assist mainly for safety - line management walker management. Also needs cues for hand placement  Ambulation/Gait Ambulation/Gait assistance: Min guard Gait Distance (Feet): 150 Feet Assistive device:  Rolling walker (2 wheeled) Gait Pattern/deviations: Step-through pattern;Decreased stride length;Trunk flexed;Wide base of support   Gait velocity interpretation: <1.31 ft/sec, indicative of household ambulator General Gait Details: Pt was able to ambulate with RW wtih min guard assist progressing to hallway.  Pt did need 2LO2 as he desat to 85% with activity.  Pt sats with activity were 89% with 2LO2.     Stairs             Wheelchair Mobility    Modified Rankin (Stroke Patients Only)       Balance Overall balance assessment: Needs assistance Sitting-balance support: Feet supported;No upper extremity supported Sitting balance-Leahy Scale: Fair Sitting balance - Comments: EOB without back support   Standing balance support: Bilateral upper extremity supported;Single extremity supported;During functional activity Standing balance-Leahy Scale: Poor Standing balance comment: dependent on at least one UE support                            Cognition Arousal/Alertness: Awake/alert Behavior During Therapy: Flat affect Overall Cognitive Status: Within Functional Limits for tasks assessed                                 General Comments: no mentions of suicidal ideation during session, tearful about wife passing      Exercises General Exercises - Lower Extremity Ankle Circles/Pumps: AROM;Both;10 reps;Seated Long Arc Quad: AROM;Both;10 reps;Seated    General Comments        Pertinent Vitals/Pain  Pain Assessment: Faces Faces Pain Scale: Hurts worst Pain Location: Bil LE Pain Descriptors / Indicators: Discomfort;Tender;Tightness Pain Intervention(s): Limited activity within patient's tolerance;Monitored during session;Repositioned;Premedicated before session;RN gave pain meds during session    Home Living                      Prior Function            PT Goals (current goals can now be found in the care plan section) Acute Rehab  PT Goals Patient Stated Goal: decrease swelling in BLE Progress towards PT goals: Progressing toward goals    Frequency    Min 3X/week      PT Plan Current plan remains appropriate    Co-evaluation              AM-PAC PT "6 Clicks" Mobility   Outcome Measure  Help needed turning from your back to your side while in a flat bed without using bedrails?: A Little Help needed moving from lying on your back to sitting on the side of a flat bed without using bedrails?: A Little Help needed moving to and from a bed to a chair (including a wheelchair)?: A Little Help needed standing up from a chair using your arms (e.g., wheelchair or bedside chair)?: A Little Help needed to walk in hospital room?: A Little Help needed climbing 3-5 steps with a railing? : A Little 6 Click Score: 18    End of Session Equipment Utilized During Treatment: Gait belt;Oxygen Activity Tolerance: Patient limited by fatigue Patient left: in bed;with call bell/phone within reach;with nursing/sitter in room (on EOB) Nurse Communication: Mobility status PT Visit Diagnosis: Muscle weakness (generalized) (M62.81)     Time: 2951-8841 PT Time Calculation (min) (ACUTE ONLY): 27 min  Charges:  $Gait Training: 8-22 mins $Therapeutic Exercise: 8-22 mins                     Lilybelle Mayeda W,PT Acute Rehabilitation Services Pager:  [phone]  Office:  [place] 10/02/2019, 10:18 AM

## 2019-10-02 NOTE — Progress Notes (Addendum)
[facility]  PROGRESS NOTE  [name] KNL:[id number] DOB: Feb 28, 1945 DOA: 09/27/2019 PCP: [name], MD Admit date - 09/27/2019   Admitting Physician [name], MD  Outpatient Primary MD for the patient is [name], MD  LOS - 4 Brief Narrative   Tyler Mcmillan is a 74 y.o. year old male with medical history significant for alcohol abuse, non-small cell left lung cancer status post stereotactic radiotherapy, HTN, AAA repair who presented on 8/19 with intentional overdose of oxycodone for suicide attempt and was found to have acute hypoxic respiratory failure of multifactorial etiology related to opiate overdose, aspiration pneumonia requiring IV Zosyn and chronic left-sided pleural effusion.   Hospital course: On oral antibiotics for aspiration pneumonia, still requiring IV lasix for peripheral edema likely attributable to cirrhosis.  Patient has chronic left pleura effusio in setting of left sided lung cancer. The effusion is small, and stable in size on CT chest imaging which also confirms pneumonia and compressive atelectasis on both bases   Subjective  Notes improvement in swelling in legs but still thinks he needs more. Feels like his cough is getting looser and producing more phlegm. No fevers or chills. No abdominal pain  A & P   Acute hypoxic respiratory failure, improving.  In the setting of attempted suicide with opioid overdose likely further complicated by element of aspiration pneumonia in a patient with chronic left loculated pleural effusion and emphysema.  Repeat chest x-ray shows consolidation at bases concerning for pneumonia and atelectasis, effusion remains stable. Currently on 2 L ( no O2 at home). On ambulatory O2 testing still needed O2 to keep saturations at [place] -continue augmentin -Wean O2 as able, SPO2 greater than 92%, home O2 will be needed, C/m consulted -Monitor chronic effusion (in setting of known lung cancer), no current  indication for thoracentesis given stable size and small -Encourage incentive spirometry,  flutter valve, scheduled mucinex   Bibasilar pneumonia, likely aspiration pneumonia and compressive atelectasis bilaterally.  CT chest shows bibasilar disease. Likely combination of aspiration pneumonia and atlectasis. Some concern for aspiration pneumonia given overdose with opioid, though would expect that to be on the right side.  Patient remains afebrile without white count on current antibiotic regimen -Wean O2 as able -continue augmentin --Encourage incentive spirometry, flutter vlave -Monitor CBC  Bilateral lower extremity swelling secondary to decompensated Cirrhosis.  Patient states has been ongoing for a week. TTE and venous duplex negative. Cirrhosis mentioned on ct chest and confirmed on RUQ u.s. responded well to IV lasix with 1L out in last 24 hours and peripheral edema improving --Repeat IV lasix 40 mg x 1 more day and assess output and swelling in am, will likely transition to oral diuretics in 24-48 hours --supportive care with elevation of legs encouraged  Non-small cell lung cancer of left lower lobe (diagnosed 07/2016) with chronic left pleural effusion followed by Dr. [name].  Curative stereotactic radiotherapy to the left lung mass in September 2018.  In 2020 required hospitalization and placement of Pleurx catheter for drainage that was ultimately removed.  Last evaluated by CT surgery (Dr. [name]) on 08/29/2018 who did not recommend resection of lung mass given no evidence of progression of disease on past CT scan -CT chest shows stable lung mass and chronic effusion with only small right sided pleural effusion --medical oncology advises outpatient follow up after review of stable CT scan   Intentional opioid overdose with suicide attempt.  Anxiety, Depression, PTSD Admits to feeling like a burden  to his stepson, social isolation. Initially IVC'd based on Behavioral health  assessment and concern for suicide on admission. On their assessment he minimizes his suicidal attempt, he calls it an attempt at euthanization, and that "it didn't work so I guess I will try it again" and denies active suicidal ideation. Reports history of ineffective oral antidepressant medications -Patient reassessed on 8/24 by psych--recommend geripsychiatric inpatient admission once medically stable, consulted TOC to assist -Currently IVC in place,-sitter in place -Continue suicide precautions --Ask chaplain for bereavement therapy --Psych recommends Prozac 20 mg qd for depression,anxiety and suicidal thoughts --Home klonopin PRN anxiety  History of abdominal aortic aneurysm, repaired with stent graft August 2018  Alcohol abuse Reportedly drank a whole bottle of wine in addition to his opioids.  No current signs or symptoms of withdrawal -Monitor on CIWA protocol -Folic acid, multivitamin, thiamine --Emphasized Etoh cessation  Chronic pain, stable Takes oxycodone IR 20 mg every 6 hours, follows with pain management clinic, confirmed only provider for pain management on [place] controlled substance reporting database -Decrease oxycodone IR to 15 mg every 4 hours as needed in light of recent overdose, but this is to help with any pain management related to his malignancy --followed outpatient by pain management  Cirrhosis. Mentioned on CT chest. Patient does admit to alcohol abuse. wnl INR and plt. HCV neg. Likely cause of peripheral edema. Confirmed on RUQ [place].  --IV lasix 40 mg x1 will repeat and assess volume status in am   Hypokalemia, mild. In the setting of active diuresis.  -repelete orally -monitor daily BMP while diuresing      Family Communication  : None  Code Status : Full code, discussed on day of admission  Disposition Plan  :  Patient is from home. Anticipated d/c date: 1 to 2 days. Barriers to d/c or necessity for inpatient status:  wean O2 as able for  acute hypoxic respiratory failure,  IV lasix, once medically stable will need inpatient admission to geripsychiatric unit  Consults  : Psych  Procedures  : TTE, venous duplex  DVT Prophylaxis  :  Lovenox  Lab Results  Component Value Date   PLT 251 10/01/2019    Diet :  Diet Order            Diet Heart Room service appropriate? Yes; Fluid consistency: Thin  Diet effective now                  Inpatient Medications Scheduled Meds: . amLODipine  10 mg Oral Daily  . amoxicillin-clavulanate  1 tablet Oral Q12H  . docusate sodium  100 mg Oral BID  . enoxaparin (LOVENOX) injection  40 mg Subcutaneous Daily  . folic acid  1 mg Oral Daily  . guaiFENesin  600 mg Oral BID  . multivitamin with minerals  1 tablet Oral Daily  . nicotine  14 mg Transdermal Daily  . pantoprazole  40 mg Oral BID  . thiamine  100 mg Oral Daily   Or  . thiamine  100 mg Intravenous Daily   Continuous Infusions:  PRN Meds:.acetaminophen **OR** acetaminophen, bisacodyl, clonazePAM, hydrALAZINE, ondansetron **OR** ondansetron (ZOFRAN) IV, oxyCODONE, polyethylene glycol  Antibiotics  :   Anti-infectives (From admission, onward)   Start     Dose/Rate Route Frequency Ordered Stop   09/29/19 1215  amoxicillin-clavulanate (AUGMENTIN) 875-125 MG per tablet 1 tablet        1 tablet Oral Every 12 hours 09/29/19 1123 10/04/19 0959   09/27/19 1130  piperacillin-tazobactam (ZOSYN) IVPB 3.375 g  Status:  Discontinued        3.375 g 12.5 mL/hr over 240 Minutes Intravenous Every 8 hours 09/27/19 0605 09/29/19 1123   09/27/19 0415  piperacillin-tazobactam (ZOSYN) IVPB 3.375 g        3.375 g 100 mL/hr over 30 Minutes Intravenous  Once 09/27/19 0408 09/27/19 0602       Objective   Vitals:   10/02/19 0416 10/02/19 0500 10/02/19 0804 10/02/19 1205  BP: (!) 141/71  (!) 144/74 140/67  Pulse: 83  89 85  Resp: (!) 21 18 19 20   Temp: 98.6 F (37 C)  99 F (37.2 C) 99.2 F (37.3 C)  TempSrc: Oral  Oral Oral    SpO2: 96%  95% 95%  Weight: 82.7 kg     Height:        SpO2: 95 % O2 Flow Rate (L/min): 1 L/min  Wt Readings from Last 3 Encounters:  10/02/19 82.7 kg  05/16/18 73.5 kg  04/17/18 71.2 kg     Intake/Output Summary (Last 24 hours) at 10/02/2019 1416 Last data filed at 10/02/2019 0900 Gross per 24 hour  Intake 530 ml  Output 300 ml  Net 230 ml    Physical Exam:     Awake Alert, Oriented X 3, flat affect No new F.N deficits,  [place].AT, Normal respiratory effort on 1L [place], decreased breath sounds on right > left, occasional rhonchi, no wheezing,  RRR,No Gallops,Rubs or new Murmurs,  +ve B.Sounds, Abd Soft, No tenderness, No rebound, guarding or rigidity. Non-pitting edema of bilateral lower extremities to calves ( improved), scant pitting edema of bilateral thighs   I have personally reviewed the following:   Data Reviewed:  CBC Recent Labs  Lab 09/27/19 0402 09/28/19 0457 09/29/19 0235 09/30/19 0627 10/01/19 0721  WBC 8.4 6.4 6.5 7.2 6.3  HGB 10.6* 9.1* 8.9* 9.7* 9.7*  HCT 39.1 32.1* 31.8* 33.4* 33.2*  PLT 298 258 250 269 251  MCV 85.7 85.4 83.7 83.5 82.4  MCH 23.2* 24.2* 23.4* 24.3* 24.1*  MCHC 27.1* 28.3* 28.0* 29.0* 29.2*  RDW 18.4* 18.3* 18.0* 17.8* 17.5*  LYMPHSABS 0.4* 0.5* 0.5* 0.7 0.5*  MONOABS 0.7 0.7 0.7 0.8 0.7  EOSABS 0.0 0.1 0.1 0.2 0.2  BASOSABS 0.0 0.0 0.0 0.0 0.0    Chemistries  Recent Labs  Lab 09/27/19 0402 09/28/19 0457 09/29/19 0235 10/01/19 0721 10/02/19 0526 10/02/19 0948  NA 136 140 139 136 137  --   K 4.7 4.1 4.0 3.3* 3.3*  --   CL 100 100 101 98 97*  --   CO2 26 29 30 28 30   --   GLUCOSE 113* 88 100* 106* 109*  --   BUN 11 15 12 8 10   --   CREATININE 1.13 1.17 0.97 0.80 0.78  --   CALCIUM 8.7* 8.4* 8.3* 8.5* 8.7*  --   MG  --   --   --   --   --  1.7  AST 30  --   --  26 30  --   ALT 13  --   --  14 16  --   ALKPHOS 68  --   --  55 57  --   BILITOT 0.5  --   --  0.5 0.4  --     ------------------------------------------------------------------------------------------------------------------ No results for input(s): CHOL, HDL, LDLCALC, TRIG, CHOLHDL, LDLDIRECT in the last 72 hours.  No results found for: HGBA1C ------------------------------------------------------------------------------------------------------------------ No results for input(s):  TSH, T4TOTAL, T3FREE, THYROIDAB in the last 72 hours.  Invalid input(s): FREET3 ------------------------------------------------------------------------------------------------------------------ No results for input(s): VITAMINB12, FOLATE, FERRITIN, TIBC, IRON, RETICCTPCT in the last 72 hours.  Coagulation profile Recent Labs  Lab 09/29/19 1222  INR 1.1    No results for input(s): DDIMER in the last 72 hours.  Cardiac Enzymes No results for input(s): CKMB, TROPONINI, MYOGLOBIN in the last 168 hours.  Invalid input(s): CK ------------------------------------------------------------------------------------------------------------------    Component Value Date/Time   BNP 609.5 (H) 09/27/2019 0403    Micro Results Recent Results (from the past 240 hour(s))  SARS Coronavirus 2 by RT PCR (hospital order, performed in [facility] hospital lab) Nasopharyngeal Nasopharyngeal Swab     Status: None   Collection Time: 09/27/19  4:18 AM   Specimen: Nasopharyngeal Swab  Result Value Ref Range Status   SARS Coronavirus 2 NEGATIVE NEGATIVE Final    Comment: (NOTE) SARS-CoV-2 target nucleic acids are NOT DETECTED.  The SARS-CoV-2 RNA is generally detectable in upper and lower respiratory specimens during the acute phase of infection. The lowest concentration of SARS-CoV-2 viral copies this assay can detect is 250 copies / mL. A negative result does not preclude SARS-CoV-2 infection and should not be used as the sole basis for treatment or other patient management decisions.  A negative result may occur  with improper specimen collection / handling, submission of specimen other than nasopharyngeal swab, presence of viral mutation(s) within the areas targeted by this assay, and inadequate number of viral copies (<250 copies / mL). A negative result must be combined with clinical observations, patient history, and epidemiological information.  Fact Sheet for Patients:   StrictlyIdeas.no  Fact Sheet for Healthcare Providers: BankingDealers.co.za  This test is not yet approved or  cleared by the [place] FDA and has been authorized for detection and/or diagnosis of SARS-CoV-2 by FDA under an Emergency Use Authorization (EUA).  This EUA will remain in effect (meaning this test can be used) for the duration of the COVID-19 declaration under Section 564(b)(1) of the Act, 21 U.S.C. section 360bbb-3(b)(1), unless the authorization is terminated or revoked sooner.  Performed at [facility], [place] [address], [place], [place] [address]   Blood culture (routine x 2)     Status: None   Collection Time: 09/27/19  5:04 AM   Specimen: BLOOD RIGHT HAND  Result Value Ref Range Status   Specimen Description BLOOD RIGHT HAND  Final   Special Requests   Final    BOTTLES DRAWN AEROBIC AND ANAEROBIC Blood Culture results may not be optimal due to an inadequate volume of blood received in culture bottles   Culture   Final    NO GROWTH 5 DAYS Performed at [facility], [place] [address] [place], [place] [address]    Report Status 10/02/2019 FINAL  Final  Blood culture (routine x 2)     Status: None   Collection Time: 09/27/19  5:04 AM   Specimen: BLOOD LEFT HAND  Result Value Ref Range Status   Specimen Description BLOOD LEFT HAND  Final   Special Requests   Final    BOTTLES DRAWN AEROBIC AND ANAEROBIC Blood Culture results may not be optimal due to an inadequate volume of blood received in culture bottles   Culture   Final    NO GROWTH 5  DAYS Performed at [facility], [place] [address] [place], [place] [address]    Report Status 10/02/2019 FINAL  Final    Radiology  Reports DG Chest 2 View  Result Date: 09/28/2019 CLINICAL DATA:  Hypoxia EXAM: CHEST - 2 VIEW COMPARISON:  Chest radiograph 09/27/2019, CT chest 07/10/2018 FINDINGS: When accounting for differences in technique, likely similar loculated left pleural effusion. No discernible pneumothorax. New left basilar consolidation with silhouetting of the left hemidiaphragm. Similar chronic scarring and architectural distortion, most pronounced in the left upper lobe. Similar left lung volume loss with leftward shift of the mediastinum. Similar mild enlargement the cardiopericardial silhouette. Approximately 2.6 cm central cavitary mass in the left upper lobe, better characterized on prior CT chest from 07/10/2018 and consistent with known primary bronchogenic neoplasm. No acute osseous abnormality. Although suboptimally evaluated, grossly similar appearance of anterior wedging of multiple thoracic vertebral bodies and a chronic L1 compression fracture. IMPRESSION: 1. New left basilar consolidation, concerning for aspiration and/or pneumonia. 2. Loculated left pleural effusion, likely similar when accounting for differences in technique. 3. Redemonstrated left upper lobe mass, consistent with known primary bronchogenic neoplasm.CT chest could better assess for progression, if indicated. Electronically Signed   By: Margaretha Sheffield MD   On: 09/28/2019 08:34   CT CHEST W CONTRAST  Result Date: 09/28/2019 CLINICAL DATA:  74 year old male with history of non-small cell lung cancer and pleural effusion. EXAM: CT CHEST WITH CONTRAST TECHNIQUE: Multidetector CT imaging of the chest was performed during intravenous contrast administration. CONTRAST:  22mL OMNIPAQUE IOHEXOL 300 MG/ML  SOLN COMPARISON:  Chest CT dated 07/10/2018. FINDINGS: Cardiovascular: There is no cardiomegaly or  pericardial effusion. There is advanced 3 vessel coronary vascular calcification. There is severe atherosclerotic calcification of the thoracic aorta. No aneurysmal dilatation or dissection. Evaluation of the pulmonary arteries is limited due to suboptimal opacification of the peripheral branches. No central pulmonary artery embolus identified. Mediastinum/Nodes: No hilar adenopathy. Top-normal subcarinal lymph node measures 10 mm in short axis. The esophagus is grossly unremarkable. No mediastinal fluid collection. Lungs/Pleura: There is a 2.6 x 3.6 cm left upper lobe mass similar to prior CT in keeping with known malignancy. Areas of linear and reticular densities adjacent to the mass likely scarring and related to post treatment changes. A 2.1 x 1.4 cm nodular density along the left mediastinal pleural (49/3) likely scarring. Overall there is volume loss in the left upper lobe. There is background of emphysema. There are small bilateral pleural effusions. The right pleural effusion is new since the prior CT. There are bibasilar compressive atelectasis or pneumonia. There is no pneumothorax. The central airways are patent. Upper Abdomen: Cirrhosis. Small perihepatic free fluid. 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 Musculoskeletal: Osteopenia with degenerative changes of the spine. L1 compression fracture, seen previously. No acute osseous pathology. IMPRESSION: 1. No CT evidence of central pulmonary artery embolus. 2. No significant interval change in the size of left upper lobe mass/malignancy and surrounding post radiation changes/scarring. 3. Small bilateral pleural effusions. The right pleural effusion is new since the prior CT. 4. Bibasilar compressive atelectasis or pneumonia. 5. Cirrhosis with small perihepatic free fluid. 6. Aortic Atherosclerosis (ICD10-I70.0) and Emphysema (ICD10-J43.9). Electronically Signed   By: Anner Crete M.D.    On: 09/28/2019 22:30   DG CHEST PORT 1 VIEW  Result Date: 10/02/2019 CLINICAL DATA:  Cough.  Decreased breath sounds EXAM: PORTABLE CHEST 1 VIEW COMPARISON:  September 28, 2019 chest radiograph and chest CT FINDINGS: Pleural effusions are noted bilaterally. There is consolidation in the left base, likely due to a combination of pneumonia and compressive atelectasis. A lesser degree  of suspected compressive atelectasis is noted in the right base. The nodular area which appears masslike on CT in the left upper lobe is again noted, measuring approximately 2 x 1.5 cm. No new opacity is appreciable. The heart size and pulmonary vascularity are normal. No adenopathy is appreciable. There is aortic atherosclerosis. No bone lesions. IMPRESSION: Stool effusions bilaterally. Combination of probable pneumonia and atelectasis left base with compressive atelectasis right base. Somewhat nodular appearing area which appears masslike on CT in the left upper lobe is again noted. No new opacity is evident. Stable cardiac silhouette. Aortic Atherosclerosis (ICD10-I70.0). Electronically Signed   By: Lowella Grip III M.D.   On: 10/02/2019 13:57   DG Chest Port 1 View  Result Date: 09/27/2019 CLINICAL DATA:  Suicide attempt, took 60 oxycodone and drink a bottle of wine EXAM: PORTABLE CHEST 1 VIEW COMPARISON:  CT 07/10/2018 FINDINGS: Chronic left pleural effusion resulting in some gradient opacity of the left lung reflecting layering fluid. Chronic scarring and architectural distortion changes in the left upper lung as well as a more masslike opacity likely corresponding to a site of prior malignancy and or treatment related change. Background of chronically coarsened interstitial changes throughout both lungs. No right effusion. No visible pneumothorax. The aorta is calcified. The remaining cardiomediastinal contours are unremarkable. Stable mild cardiomegaly. No acute osseous or soft tissue abnormality. Degenerative changes  are present in the imaged spine and shoulders. Telemetry leads overlie the chest. IMPRESSION: 1. Chronic left pleural effusion, not significantly changed from prior counting for differences in technique and inflation. 2. Low volumes and atelectasis. 3. Chronic scarring and architectural distortion changes in the left upper lung 4. Masslike opacity likely corresponding to a site of prior malignancy and or treatment related change in the left upper lobe. 5. Additional chronic coarsened interstitial changes. 6. Stable cardiomegaly. 7.  Aortic Atherosclerosis (ICD10-I70.0). Electronically Signed   By: Lovena Le M.D.   On: 09/27/2019 04:40   ECHOCARDIOGRAM COMPLETE  Result Date: 09/28/2019    ECHOCARDIOGRAM REPORT   Patient Name:   Tyler Mcmillan Date of Exam: 09/28/2019 Medical Rec #:  [id number]             Height:       70.0 in Accession #:    [id number]            Weight:       198.6 lb Date of Birth:  April 19, 1945             BSA:          2.081 m Patient Age:    48 years              BP:           133/61 mmHg Patient Gender: M                     HR:           89 bpm. Exam Location:  Inpatient Procedure: 2D Echo, Cardiac Doppler, Color Doppler and Intracardiac            Opacification Agent Indications:    Peripheral edema  History:        Patient has prior history of Echocardiogram examinations, most                 recent 04/03/2018. Risk Factors:Hypertension and Current Smoker.                 Acute respiratory falure  with hypoxia. ETOH abuse.  Sonographer:    Clayton Lefort RDCS (AE) Referring Phys: [id number] [place]  1. Left ventricular ejection fraction, by estimation, is 60 to 65%. The left ventricle has normal function. The left ventricle has no regional wall motion abnormalities. There is severe asymmetric left ventricular hypertrophy of the basal-septal and mild concentric LVH. Left ventricular diastolic parameters were normal.  2. Right ventricular systolic function is normal. The  right ventricular size is normal. Tricuspid regurgitation signal is inadequate for assessing PA pressure.  3. The mitral valve is grossly normal. Trivial mitral valve regurgitation. No evidence of mitral stenosis.  4. The aortic valve is abnormal. Aortic valve regurgitation is not visualized. Mild aortic valve sclerosis is present, with no evidence of aortic valve stenosis.  5. The inferior vena cava is dilated in size with <50% respiratory variability, suggesting right atrial pressure of 15 mmHg.  6. Aortic dilatation noted. There is borderline dilatation of the aortic root measuring 39 mm. FINDINGS  Left Ventricle: Left ventricular ejection fraction, by estimation, is 60 to 65%. The left ventricle has normal function. The left ventricle has no regional wall motion abnormalities. Definity contrast agent was given IV to delineate the left ventricular  endocardial borders. The left ventricular internal cavity size was normal in size. There is severe asymmetric left ventricular hypertrophy of the basal-septal and mild concentric LVH segments. Left ventricular diastolic parameters were normal. Right Ventricle: The right ventricular size is normal. No increase in right ventricular wall thickness. Right ventricular systolic function is normal. Tricuspid regurgitation signal is inadequate for assessing PA pressure. Left Atrium: Left atrial size was normal in size. Right Atrium: Right atrial size was normal in size. Pericardium: There is no evidence of pericardial effusion. Mitral Valve: The mitral valve is grossly normal. Normal mobility of the mitral valve leaflets. Trivial mitral valve regurgitation. No evidence of mitral valve stenosis. MV peak gradient, 6.6 mmHg. The mean mitral valve gradient is 3.0 mmHg with average heart rate of 88 bpm. Tricuspid Valve: The tricuspid valve is grossly normal. Tricuspid valve regurgitation is trivial. No evidence of tricuspid stenosis. Aortic Valve: The aortic valve is abnormal.  Aortic valve regurgitation is not visualized. Mild aortic valve sclerosis is present, with no evidence of aortic valve stenosis. There is mild calcification of the aortic valve. Aortic valve mean gradient measures 5.0 mmHg. Aortic valve peak gradient measures 8.1 mmHg. Aortic valve area, by VTI measures 3.50 cm. Pulmonic Valve: The pulmonic valve was not well visualized. Pulmonic valve regurgitation is not visualized. No evidence of pulmonic stenosis. Aorta: Aortic dilatation noted. There is borderline dilatation of the aortic root measuring 39 mm. Venous: The inferior vena cava is dilated in size with less than 50% respiratory variability, suggesting right atrial pressure of 15 mmHg. IAS/Shunts: No atrial level shunt detected by color flow Doppler.  LEFT VENTRICLE PLAX 2D LVIDd:         4.00 cm  Diastology LVIDs:         2.60 cm  LV e' lateral:   8.38 cm/s LV PW:         1.50 cm  LV E/e' lateral: 12.9 LV IVS:        2.10 cm  LV e' medial:    7.62 cm/s LVOT diam:     2.40 cm  LV E/e' medial:  14.2 LV SV:         105 LV SV Index:   51 LVOT Area:     4.52  cm  RIGHT VENTRICLE             IVC RV Basal diam:  4.70 cm     IVC diam: 2.40 cm RV Mid diam:    4.30 cm RV S prime:     12.60 cm/s TAPSE (M-mode): 2.0 cm LEFT ATRIUM              Index       RIGHT ATRIUM           Index LA diam:        4.00 cm  1.92 cm/m  RA Area:     22.20 cm LA Vol (A2C):   101.0 ml 48.53 ml/m RA Volume:   63.90 ml  30.70 ml/m LA Vol (A4C):   34.9 ml  16.77 ml/m LA Biplane Vol: 60.2 ml  28.93 ml/m  AORTIC VALVE AV Area (Vmax):    3.73 cm AV Area (Vmean):   3.67 cm AV Area (VTI):     3.50 cm AV Vmax:           142.00 cm/s AV Vmean:          107.000 cm/s AV VTI:            0.301 m AV Peak Grad:      8.1 mmHg AV Mean Grad:      5.0 mmHg LVOT Vmax:         117.00 cm/s LVOT Vmean:        86.800 cm/s LVOT VTI:          0.233 m LVOT/AV VTI ratio: 0.77  AORTA Ao Root diam: 3.90 cm Ao Asc diam:  3.80 cm MITRAL VALVE MV Area (PHT): 3.27 cm      SHUNTS MV Peak grad:  6.6 mmHg     Systemic VTI:  0.23 m MV Mean grad:  3.0 mmHg     Systemic Diam: 2.40 cm MV Vmax:       1.28 m/s MV Vmean:      79.1 cm/s MV Decel Time: 232 msec MV E velocity: 108.00 cm/s MV A velocity: 93.80 cm/s MV E/A ratio:  1.15 Cherlynn Kaiser MD Electronically signed by Cherlynn Kaiser MD Signature Date/Time: 09/28/2019/5:00:44 PM    Final    VAS [place] LOWER EXTREMITY VENOUS (DVT)  Result Date: 09/29/2019  Lower Venous DVTStudy Indications: Swelling, and Pain.  Risk Factors: Cancer History of lung cancer. Comparison Study: Prior study 11-17-2016 Bilateral LEV was WNL Performing Technologist: Darlin Coco  Examination Guidelines: A complete evaluation includes B-mode imaging, spectral Doppler, color Doppler, and power Doppler as needed of all accessible portions of each vessel. Bilateral testing is considered an integral part of a complete examination. Limited examinations for reoccurring indications may be performed as noted. The reflux portion of the exam is performed with the patient in reverse Trendelenburg.  +---------+---------------+---------+-----------+----------+--------------+ RIGHT    CompressibilityPhasicitySpontaneityPropertiesThrombus Aging +---------+---------------+---------+-----------+----------+--------------+ CFV      Full           Yes      Yes                                 +---------+---------------+---------+-----------+----------+--------------+ SFJ      Full                                                        +---------+---------------+---------+-----------+----------+--------------+  FV Prox  Full                                                        +---------+---------------+---------+-----------+----------+--------------+ FV Mid   Full                                                        +---------+---------------+---------+-----------+----------+--------------+ FV DistalFull                                                         +---------+---------------+---------+-----------+----------+--------------+ PFV      Full                                                        +---------+---------------+---------+-----------+----------+--------------+ POP      Full           Yes      Yes                                 +---------+---------------+---------+-----------+----------+--------------+ PTV      Full                                                        +---------+---------------+---------+-----------+----------+--------------+ PERO     Full                                                        +---------+---------------+---------+-----------+----------+--------------+   +---------+---------------+---------+-----------+----------+--------------+ LEFT     CompressibilityPhasicitySpontaneityPropertiesThrombus Aging +---------+---------------+---------+-----------+----------+--------------+ CFV      Full           Yes      Yes                                 +---------+---------------+---------+-----------+----------+--------------+ SFJ      Full                                                        +---------+---------------+---------+-----------+----------+--------------+ FV Prox  Full                                                        +---------+---------------+---------+-----------+----------+--------------+  FV Mid   Full                                                        +---------+---------------+---------+-----------+----------+--------------+ FV DistalFull                                                        +---------+---------------+---------+-----------+----------+--------------+ PFV      Full                                                        +---------+---------------+---------+-----------+----------+--------------+ POP      Full           Yes      Yes                                  +---------+---------------+---------+-----------+----------+--------------+ PTV      Full                                                        +---------+---------------+---------+-----------+----------+--------------+ PERO     Full                                                        +---------+---------------+---------+-----------+----------+--------------+     Summary: RIGHT: - There is no evidence of deep vein thrombosis in the lower extremity.  - No cystic structure found in the popliteal fossa.  LEFT: - There is no evidence of deep vein thrombosis in the lower extremity.  - No cystic structure found in the popliteal fossa.  *See table(s) above for measurements and observations. Electronically signed by Deitra Mayo MD on 09/29/2019 at 6:38:00 AM.    Final    US Abdomen Limited RUQ  Result Date: 09/30/2019 CLINICAL DATA:  74 year old male with cirrhosis and ascites. EXAM: ULTRASOUND ABDOMEN LIMITED RIGHT UPPER QUADRANT COMPARISON:  03/30/2018 CT and 07/31/2012 ultrasound FINDINGS: Gallbladder: Multiple small mobile gallstones are noted, the largest measuring 4 mm. There is no evidence of gallbladder wall thickening, pericholecystic fluid or sonographic Murphy sign. Common bile duct: Diameter: 4 mm.  No intrahepatic or extrahepatic biliary dilatation. Liver: Slight nodularity of the hepatic contour is noted, compatible with this patient's known cirrhosis. No focal hepatic abnormalities or masses are identified. Portal vein is patent on color Doppler imaging with normal direction of blood flow towards the liver. Other: None. IMPRESSION: 1. Cholelithiasis without evidence of acute cholecystitis. No biliary dilatation. 2. Cirrhosis. No focal hepatic mass identified. Electronically Signed   By: Margarette Canada M.D.   On: 09/30/2019 17:04     Time Spent in minutes  30  [name] M.D on 10/02/2019 at 2:16 PM  To page go to [email or web address] - password  [facility]

## 2019-10-03 LAB — COMPREHENSIVE METABOLIC PANEL
ALT: 25 U/L (ref 0–44)
AST: 44 U/L — ABNORMAL HIGH (ref 15–41)
Albumin: 3 g/dL — ABNORMAL LOW (ref 3.5–5.0)
Alkaline Phosphatase: 58 U/L (ref 38–126)
Anion gap: 9 (ref 5–15)
BUN: 14 mg/dL (ref 8–23)
CO2: 28 mmol/L (ref 22–32)
Calcium: 8.8 mg/dL — ABNORMAL LOW (ref 8.9–10.3)
Chloride: 98 mmol/L (ref 98–111)
Creatinine, Ser: 0.86 mg/dL (ref 0.61–1.24)
GFR calc Af Amer: >60 mL/min (ref >60)
GFR calc non Af Amer: >60 mL/min (ref >60)
Glucose, Bld: 95 mg/dL (ref 70–99)
Potassium: 4.2 mmol/L (ref 3.5–5.1)
Sodium: 135 mmol/L (ref 135–145)
Total Bilirubin: 0.4 mg/dL (ref 0.3–1.2)
Total Protein: 6.9 g/dL (ref 6.5–8.1)

## 2019-10-03 MED ORDER — FUROSEMIDE 20 MG PO TABS
20.0000 mg | ORAL_TABLET | Freq: Every day | ORAL | Status: DC
  Administered 2019-10-03 – 2019-10-13 (×11): 20 mg via ORAL
  Filled 2019-10-03 (×11): qty 1

## 2019-10-03 MED ORDER — AMOXICILLIN-POT CLAVULANATE 875-125 MG PO TABS
1.0000 | ORAL_TABLET | Freq: Two times a day (BID) | ORAL | Status: DC
  Administered 2019-10-03 – 2019-10-04 (×3): 1 via ORAL
  Filled 2019-10-03 (×3): qty 1

## 2019-10-03 MED ORDER — SPIRONOLACTONE 25 MG PO TABS
50.0000 mg | ORAL_TABLET | Freq: Every day | ORAL | Status: DC
  Administered 2019-10-03 – 2019-10-13 (×11): 50 mg via ORAL
  Filled 2019-10-03 (×11): qty 2

## 2019-10-03 NOTE — Progress Notes (Signed)
[facility] PROGRESS NOTE    Progress Note  Tyler Mcmillan  ZOX:[id number] DOB: 17-Jul-1945 DOA: 09/27/2019 PCP: [name], MD     Brief Narrative:   Tyler Mcmillan is an 74 y.o. male past medical history significant for alcohol abuse, none small cell lung cancer status post directed radiotherapy essential hypertension AAA repair presents on 09/27/2019 with intentional overdose of oxycodone for suicidal ideation was found to be in acute hypoxic respiratory failure in the setting of aspiration pneumonia, will start on IV Zosyn, now switched to oral antibiotics, as his fluid overload he was started on IV Lasix.  Assessment/Plan:   Acute respiratory failure with hypoxia due to aspiration pneumonia, Chest x-ray showed consolidation he was started on IV empiric antibiotics which has now been switched to oral. Try and wean him to room air. Continue to encourage incentive spirometry flutter valve. In the setting of intentional suicide attempt. He has been afebrile, will transition his IV Unasyn to Augmentin.  Bilateral lower extremity edema secondary to decompensated cirrhosis: Patient state he has been going on for several weeks transthoracic echo and venous Dopplers were negative. He was started on IV Lasix, transition to oral Lasix plus Aldactone.  Non-small cell left lower lobe carcinoma diagnosed in 07/2016; Followed by oncology as an outpatient. CT surgery evaluated him that showed no evidence of progressive disease.  Intentional opiate overdose with suicide attempt: Initially IVC placed on behavioral health. Psych was consulted who recommended geriatric psych inpatient admission. Continues to be IVC, continue suicidal precautions. Continue current psychiatric medications.  Alcohol abuse: Started on thiamine and folate continue to monitor with CIWA protocol.  Chronic pain: Takes oxycodone IR 20 mg every 6 hours, follows with pain management clinic, confirmed  only provider for pain management on [place] controlled substance reporting database followed outpatient by pain management   Stage I sacral decubitus ulcer present on admission: RN Pressure Injury Documentation: Pressure Injury 03/30/18 Stage I -  Intact skin with non-blanchable redness of a localized area usually over a bony prominence. (Active)  03/30/18 2000  Location: Sacrum  Location Orientation:   Staging: Stage I -  Intact skin with non-blanchable redness of a localized area usually over a bony prominence.  Wound Description (Comments):   Present on Admission: Yes    DVT prophylaxis: lovenox Family Communication:none Status is: Inpatient  Remains inpatient appropriate because:Hemodynamically unstable   Dispo: The patient is from: Home              Anticipated d/c is to: SNF              Anticipated d/c date is: 2 days              Patient currently is not medically stable to d/c.        Code Status:     Code Status Orders  (From admission, onward)         Start     Ordered   09/27/19 1113  Full code  Continuous        09/27/19 1113        Code Status History    Date Active Date Inactive Code Status Order ID Comments User Context   09/27/2019 0911 09/27/2019 1113 Full Code [id number]  Davonna Belling, MD ED   04/03/2018 0854 04/10/2018 1716 Partial Code [id number]  Cristal Ford, DO Inpatient   03/30/2018 1955 04/03/2018 0854 Full Code [id number]  Cristal Ford, DO Inpatient   09/08/2016 1602 09/09/2016 1520 Full Code  [id number]  Orbie Hurst Inpatient   07/02/2016 1456 07/03/2016 1654 Full Code [id number]  Elwin Mocha, MD ED   Advance Care Planning Activity        IV Access:    Peripheral IV   Procedures and diagnostic studies:   DG CHEST PORT 1 VIEW  Result Date: 10/02/2019 CLINICAL DATA:  Cough.  Decreased breath sounds EXAM: PORTABLE CHEST 1 VIEW COMPARISON:  September 28, 2019 chest radiograph and chest CT FINDINGS: Pleural  effusions are noted bilaterally. There is consolidation in the left base, likely due to a combination of pneumonia and compressive atelectasis. A lesser degree of suspected compressive atelectasis is noted in the right base. The nodular area which appears masslike on CT in the left upper lobe is again noted, measuring approximately 2 x 1.5 cm. No new opacity is appreciable. The heart size and pulmonary vascularity are normal. No adenopathy is appreciable. There is aortic atherosclerosis. No bone lesions. IMPRESSION: Stool effusions bilaterally. Combination of probable pneumonia and atelectasis left base with compressive atelectasis right base. Somewhat nodular appearing area which appears masslike on CT in the left upper lobe is again noted. No new opacity is evident. Stable cardiac silhouette. Aortic Atherosclerosis (ICD10-I70.0). Electronically Signed   By: Lowella Grip III M.D.   On: 10/02/2019 13:57     Medical Consultants:    None.  Anti-Infectives:   none  Subjective:    Tyler Mcmillan no complains.  Objective:    Vitals:   10/02/19 1528 10/02/19 1900 10/03/19 0626 10/03/19 0922  BP: (!) 148/74 (!) 144/74 (!) 157/78 (!) 149/74  Pulse: 84 80 77 78  Resp: 18 18 19    Temp: 98.8 F (37.1 C) 99.1 F (37.3 C) 98.2 F (36.8 C)   TempSrc: Oral Oral Oral   SpO2: 95% 95% 91%   Weight:   81 kg   Height:       SpO2: 91 % O2 Flow Rate (L/min): 1 L/min   Intake/Output Summary (Last 24 hours) at 10/03/2019 1254 Last data filed at 10/03/2019 1231 Gross per 24 hour  Intake 860 ml  Output 1250 ml  Net -390 ml   Filed Weights   10/01/19 0500 10/02/19 0416 10/03/19 0626  Weight: 85.1 kg 82.7 kg 81 kg    Exam: General exam: In no acute distress. Respiratory system: Good air movement and clear to auscultation. Cardiovascular system: S1 & S2 heard, RRR. No JVD. Gastrointestinal system: Abdomen is nondistended, soft and nontender.  Extremities: No pedal edema. Skin:  No rashes, lesions or ulcers Psychiatry: Judgement and insight appear normal. Mood & affect appropriate.    Data Reviewed:    Labs: Basic Metabolic Panel: Recent Labs  Lab 09/28/19 0457 09/28/19 0457 09/29/19 0235 09/29/19 0235 10/01/19 0721 10/01/19 0721 10/02/19 0526 10/02/19 0948 10/03/19 0608  NA 140  --  139  --  136  --  137  --  135  K 4.1   < > 4.0   < > 3.3*   < > 3.3*  --  4.2  CL 100  --  101  --  98  --  97*  --  98  CO2 29  --  30  --  28  --  30  --  28  GLUCOSE 88  --  100*  --  106*  --  109*  --  95  BUN 15  --  12  --  8  --  10  --  14  CREATININE 1.17  --  0.97  --  0.80  --  0.78  --  0.86  CALCIUM 8.4*  --  8.3*  --  8.5*  --  8.7*  --  8.8*  MG  --   --   --   --   --   --   --  1.7  --    < > = values in this interval not displayed.   GFR Estimated Creatinine Clearance: 77.8 mL/min (by C-G formula based on SCr of 0.86 mg/dL). Liver Function Tests: Recent Labs  Lab 09/27/19 0402 10/01/19 0721 10/02/19 0526 10/03/19 0608  AST 30 26 30  44*  ALT 13 14 16 25   ALKPHOS 68 55 57 58  BILITOT 0.5 0.5 0.4 0.4  PROT 7.3 6.6 6.8 6.9  ALBUMIN 3.1* 2.7* 2.8* 3.0*   No results for input(s): LIPASE, AMYLASE in the last 168 hours. No results for input(s): AMMONIA in the last 168 hours. Coagulation profile Recent Labs  Lab 09/29/19 1222  INR 1.1   COVID-19 Labs  No results for input(s): DDIMER, FERRITIN, LDH, CRP in the last 72 hours.  Lab Results  Component Value Date   [place] NEGATIVE 09/27/2019    CBC: Recent Labs  Lab 09/27/19 0402 09/28/19 0457 09/29/19 0235 09/30/19 0627 10/01/19 0721  WBC 8.4 6.4 6.5 7.2 6.3  NEUTROABS 7.2 5.1 5.3 5.5 4.8  HGB 10.6* 9.1* 8.9* 9.7* 9.7*  HCT 39.1 32.1* 31.8* 33.4* 33.2*  MCV 85.7 85.4 83.7 83.5 82.4  PLT 298 258 250 269 251   Cardiac Enzymes: No results for input(s): CKTOTAL, CKMB, CKMBINDEX, TROPONINI in the last 168 hours. BNP (last 3 results) No results for input(s): PROBNP in the  last 8760 hours. CBG: No results for input(s): GLUCAP in the last 168 hours. D-Dimer: No results for input(s): DDIMER in the last 72 hours. Hgb A1c: No results for input(s): HGBA1C in the last 72 hours. Lipid Profile: No results for input(s): CHOL, HDL, LDLCALC, TRIG, CHOLHDL, LDLDIRECT in the last 72 hours. Thyroid function studies: No results for input(s): TSH, T4TOTAL, T3FREE, THYROIDAB in the last 72 hours.  Invalid input(s): FREET3 Anemia work up: No results for input(s): VITAMINB12, FOLATE, FERRITIN, TIBC, IRON, RETICCTPCT in the last 72 hours. Sepsis Labs: Recent Labs  Lab 09/27/19 0402 09/27/19 0404 09/28/19 0457 09/29/19 0235 09/30/19 0627 10/01/19 0721  WBC   < >  --  6.4 6.5 7.2 6.3  LATICACIDVEN  --  1.7  --   --   --   --    < > = values in this interval not displayed.   Microbiology Recent Results (from the past 240 hour(s))  SARS Coronavirus 2 by RT PCR (hospital order, performed in [facility] hospital lab) Nasopharyngeal Nasopharyngeal Swab     Status: None   Collection Time: 09/27/19  4:18 AM   Specimen: Nasopharyngeal Swab  Result Value Ref Range Status   SARS Coronavirus 2 NEGATIVE NEGATIVE Final    Comment: (NOTE) SARS-CoV-2 target nucleic acids are NOT DETECTED.  The SARS-CoV-2 RNA is generally detectable in upper and lower respiratory specimens during the acute phase of infection. The lowest concentration of SARS-CoV-2 viral copies this assay can detect is 250 copies / mL. A negative result does not preclude SARS-CoV-2 infection and should not be used as the sole basis for treatment or other patient management decisions.  A negative result may occur with improper specimen collection / handling, submission of specimen other than nasopharyngeal swab, presence  of viral mutation(s) within the areas targeted by this assay, and inadequate number of viral copies (<250 copies / mL). A negative result must be combined with clinical observations, patient  history, and epidemiological information.  Fact Sheet for Patients:   StrictlyIdeas.no  Fact Sheet for Healthcare Providers: BankingDealers.co.za  This test is not yet approved or  cleared by the [place] FDA and has been authorized for detection and/or diagnosis of SARS-CoV-2 by FDA under an Emergency Use Authorization (EUA).  This EUA will remain in effect (meaning this test can be used) for the duration of the COVID-19 declaration under Section 564(b)(1) of the Act, 21 U.S.C. section 360bbb-3(b)(1), unless the authorization is terminated or revoked sooner.  Performed at [facility], [place] [address], [place], [place] [address]   Blood culture (routine x 2)     Status: None   Collection Time: 09/27/19  5:04 AM   Specimen: BLOOD RIGHT HAND  Result Value Ref Range Status   Specimen Description BLOOD RIGHT HAND  Final   Special Requests   Final    BOTTLES DRAWN AEROBIC AND ANAEROBIC Blood Culture results may not be optimal due to an inadequate volume of blood received in culture bottles   Culture   Final    NO GROWTH 5 DAYS Performed at [facility], [place] [address], [place], [place] [address]    Report Status 10/02/2019 FINAL  Final  Blood culture (routine x 2)     Status: None   Collection Time: 09/27/19  5:04 AM   Specimen: BLOOD LEFT HAND  Result Value Ref Range Status   Specimen Description BLOOD LEFT HAND  Final   Special Requests   Final    BOTTLES DRAWN AEROBIC AND ANAEROBIC Blood Culture results may not be optimal due to an inadequate volume of blood received in culture bottles   Culture   Final    NO GROWTH 5 DAYS Performed at [facility], [place] [address], [place], [place] [address]    Report Status 10/02/2019 FINAL  Final     Medications:   . amLODipine  10 mg Oral Daily  . amoxicillin-clavulanate  1 tablet Oral Q12H  . docusate sodium  100 mg Oral BID  . enoxaparin (LOVENOX) injection  40  mg Subcutaneous Daily  . folic acid  1 mg Oral Daily  . guaiFENesin  600 mg Oral BID  . multivitamin with minerals  1 tablet Oral Daily  . nicotine  14 mg Transdermal Daily  . pantoprazole  40 mg Oral BID  . thiamine  100 mg Oral Daily   Or  . thiamine  100 mg Intravenous Daily   Continuous Infusions:    LOS: 5 days   Charlynne Cousins  [facility]  10/03/2019, 12:54 PM

## 2019-10-03 NOTE — Progress Notes (Signed)
Occupational Therapy Treatment Patient Details Name: Tyler Mcmillan MRN: [id number] DOB: 01/13/1946 Today's Date: 10/03/2019    History of present illness Pt is a 74 y.o. male with PMH significant of SCC of left lung (2018); HTN; anxiety d/o; bladder cancer; and AAA presenting with intentional overdose. Admitted for hypoxic respiratory failure could be from edema versus aspiration. Of note: Wife died 08/10/2022, and was his caregiver and helper.    OT comments  Pt progressing towards OT goals this session. Main focus of session is education for AE to access lower body more independently - sock donner (will need bariatric), long handle shoe horn, long handle sponge, and grabber/reacher. Pt most interested in long handle shoe horn and long handle sponge. Pt sitting EOB able to perform grooming with set up at EOB, Pt frustrated about not having front teeth to assist with eating at this time. However, Pt declining spaghetti today. Current POC remains appropriate, OT will continue to follow acutely.    Follow Up Recommendations  Supervision/Assistance - 24 hour;Home health OT    Equipment Recommendations  Other (comment) (AE for LB ADL (hip kit))    Recommendations for Other Services Other (comment) (Palliative/CODE status discussion)    Precautions / Restrictions Precautions Precautions: Fall;Other (comment) (Suicide) Restrictions Weight Bearing Restrictions: No       Mobility Bed Mobility Overal bed mobility: Needs Assistance Bed Mobility: Supine to Sit     Supine to sit: Min guard     General bed mobility comments: extra time required, use of bed rail  Transfers                      Balance Overall balance assessment: Needs assistance Sitting-balance support: Feet supported;No upper extremity supported Sitting balance-Leahy Scale: Fair Sitting balance - Comments: EOB without back support                                   ADL either performed  or assessed with clinical judgement   ADL Overall ADL's : Needs assistance/impaired Eating/Feeding: Independent Eating/Feeding Details (indicate cue type and reason): EOB Grooming: Set up;Sitting;Wash/dry hands       Lower Body Bathing: Min guard;With adaptive equipment;Sitting/lateral leans Lower Body Bathing Details (indicate cue type and reason): educated on long handle sponge     Lower Body Dressing: Min guard;With adaptive equipment;Sitting/lateral leans Lower Body Dressing Details (indicate cue type and reason): educated on long handle shoe horn, sock donner, grabber/reacher                     Vision       Perception     Praxis      Cognition Arousal/Alertness: Awake/alert Behavior During Therapy: Flat affect Overall Cognitive Status: Within Functional Limits for tasks assessed                                          Exercises     Shoulder Instructions       General Comments      Pertinent Vitals/ Pain       Pain Assessment: Faces Faces Pain Scale: Hurts little more Pain Location: Bil LE Pain Descriptors / Indicators: Discomfort;Tender;Tightness Pain Intervention(s): Limited activity within patient's tolerance;Monitored during session;Repositioned  Home Living  Prior Functioning/Environment              Frequency  Min 2X/week        Progress Toward Goals  OT Goals(current goals can now be found in the care plan section)  Progress towards OT goals: Progressing toward goals  Acute Rehab OT Goals Patient Stated Goal: decrease swelling in BLE OT Goal Formulation: With patient Time For Goal Achievement: 10/12/19 Potential to Achieve Goals: Good  Plan Discharge plan remains appropriate    Co-evaluation                 AM-PAC OT "6 Clicks" Daily Activity     Outcome Measure   Help from another person eating meals?: None Help from another person  taking care of personal grooming?: A Little Help from another person toileting, which includes using toliet, bedpan, or urinal?: A Little Help from another person bathing (including washing, rinsing, drying)?: A Lot Help from another person to put on and taking off regular upper body clothing?: A Little Help from another person to put on and taking off regular lower body clothing?: A Lot 6 Click Score: 17    End of Session Equipment Utilized During Treatment: Other (comment) (AE)  OT Visit Diagnosis: Unsteadiness on feet (R26.81);Muscle weakness (generalized) (M62.81)   Activity Tolerance Patient tolerated treatment well   Patient Left in bed;with call bell/phone within reach;with nursing/sitter in room (EOB eating lunch)   Nurse Communication Mobility status;Precautions        Time: 3976-7341 OT Time Calculation (min): 15 min  Charges: OT General Charges $OT Visit: 1 Visit OT Treatments $Self Care/Home Management : 8-22 mins  Jesse Sans OTR/L Acute Rehabilitation Services Pager: [phone] Office: [place] 10/03/2019, 1:11 PM

## 2019-10-03 NOTE — Progress Notes (Signed)
CSW assisting to locate geropsych placement. Barrier is oxygen needs. CSW has contacted the following facilities:  -[facility]: Does not accept [facility]: No beds available -[facility]: Left vm -[facility]: Left vm -[facility]: No longer have psych faclity -[facility]: Waiting on response -[facility]: Waiting on response  Coraleigh Sheeran LCSW

## 2019-10-04 NOTE — Plan of Care (Signed)
  Problem: Clinical Measurements: Goal: Ability to maintain clinical measurements within normal limits will improve Outcome: Progressing Goal: Will remain free from infection Outcome: Progressing Goal: Diagnostic test results will improve Outcome: Progressing Goal: Respiratory complications will improve Outcome: Progressing Goal: Cardiovascular complication will be avoided Outcome: Progressing   Problem: Activity: Goal: Risk for activity intolerance will decrease Outcome: Progressing   Problem: Coping: Goal: Level of anxiety will decrease Outcome: Progressing   Problem: Pain Managment: Goal: General experience of comfort will improve Outcome: Progressing   Problem: Safety: Goal: Ability to remain free from injury will improve Outcome: Progressing

## 2019-10-04 NOTE — Progress Notes (Signed)
Physical Therapy Treatment Patient Details Name: Tyler Mcmillan MRN: [id number] DOB: 04-Sep-1945 Today's Date: 10/04/2019    History of Present Illness Pt is a 74 y.o. male with PMH significant of SCC of left lung (2018); HTN; anxiety d/o; bladder cancer; and AAA presenting with intentional overdose. Admitted for hypoxic respiratory failure could be from edema versus aspiration. Of note: Wife died 2022-08-20, and was his caregiver and helper.     PT Comments    Pt admitted with above diagnosis. Pt was able to ambulate with min guard assist and cues with RW.  STill needs 2LO2 to maintain sats >90%.  Pt did incr distance today.  Pt currently with functional limitations due to the deficits listed below (see PT Problem List). Pt will benefit from skilled PT to increase their independence and safety with mobility to allow discharge to the venue listed below.     Follow Up Recommendations  Home health PT (Behavioral health is the plan)     Equipment Recommendations  Other (comment) (may need O2)    Recommendations for Other Services       Precautions / Restrictions Precautions Precautions: Fall;Other (comment) (Suicide) Restrictions Weight Bearing Restrictions: No    Mobility  Bed Mobility Overal bed mobility: Needs Assistance Bed Mobility: Supine to Sit     Supine to sit: Min guard     General bed mobility comments: extra time required, use of bed rail  Transfers Overall transfer level: Needs assistance Equipment used: Rolling walker (2 wheeled) Transfers: Sit to/from [facility] Sit to Stand: Min guard;Min assist;From elevated surface Stand pivot transfers: Min guard;Min assist       General transfer comment: assist mainly for safety - line management walker management. Also needs cues for hand placement  Ambulation/Gait Ambulation/Gait assistance: Min guard Gait Distance (Feet): 200 Feet Assistive device: Rolling walker (2 wheeled) Gait  Pattern/deviations: Step-through pattern;Decreased stride length;Trunk flexed;Wide base of support   Gait velocity interpretation: <1.31 ft/sec, indicative of household ambulator General Gait Details: Pt was able to ambulate with RW wtih min guard assist progressing to hallway.  Pt did need 2LO2 as he desat to 85% with activity.  Pt sats with activity were 89% with 2LO2.     Stairs             Wheelchair Mobility    Modified Rankin (Stroke Patients Only)       Balance Overall balance assessment: Needs assistance Sitting-balance support: Feet supported;No upper extremity supported Sitting balance-Leahy Scale: Fair Sitting balance - Comments: EOB without back support   Standing balance support: Bilateral upper extremity supported;Single extremity supported;During functional activity Standing balance-Leahy Scale: Poor Standing balance comment: dependent on at least one UE support                            Cognition Arousal/Alertness: Awake/alert Behavior During Therapy: Flat affect Overall Cognitive Status: Within Functional Limits for tasks assessed                                 General Comments: no mentions of suicidal ideation during session, tearful about wife passing      Exercises General Exercises - Lower Extremity Ankle Circles/Pumps: AROM;Both;10 reps;Seated Long Arc Quad: AROM;Both;10 reps;Seated    General Comments        Pertinent Vitals/Pain Pain Assessment: Faces Faces Pain Scale: Hurts little more Pain Location: Bil  LE Pain Descriptors / Indicators: Discomfort;Tender;Tightness Pain Intervention(s): Limited activity within patient's tolerance;Monitored during session;Repositioned;Premedicated before session    Home Living                      Prior Function            PT Goals (current goals can now be found in the care plan section) Acute Rehab PT Goals Patient Stated Goal: decrease swelling in  BLE Progress towards PT goals: Progressing toward goals    Frequency    Min 3X/week      PT Plan Current plan remains appropriate    Co-evaluation              AM-PAC PT "6 Clicks" Mobility   Outcome Measure  Help needed turning from your back to your side while in a flat bed without using bedrails?: A Little Help needed moving from lying on your back to sitting on the side of a flat bed without using bedrails?: A Little Help needed moving to and from a bed to a chair (including a wheelchair)?: A Little Help needed standing up from a chair using your arms (e.g., wheelchair or bedside chair)?: A Little Help needed to walk in hospital room?: A Little Help needed climbing 3-5 steps with a railing? : A Little 6 Click Score: 18    End of Session Equipment Utilized During Treatment: Gait belt;Oxygen Activity Tolerance: Patient limited by fatigue Patient left: in bed;with call bell/phone within reach;with nursing/sitter in room (on EOB) Nurse Communication: Mobility status PT Visit Diagnosis: Muscle weakness (generalized) (M62.81)     Time: 6226-3335 PT Time Calculation (min) (ACUTE ONLY): 16 min  Charges:  $Gait Training: 8-22 mins                     Ransome Helwig W,PT [place] Pager:  [phone]  Office:  [place] 10/04/2019, 2:06 PM

## 2019-10-04 NOTE — Progress Notes (Signed)
[facility] PROGRESS NOTE    Progress Note  Tyler Mcmillan  WCH:[id number] DOB: 15-Nov-1945 DOA: 09/27/2019 PCP: [name], MD     Brief Narrative:   Tyler Mcmillan is an 74 y.o. male past medical history significant for alcohol abuse, none small cell lung cancer status post directed radiotherapy essential hypertension AAA repair presents on 09/27/2019 with intentional overdose of oxycodone for suicidal ideation was found to be in acute hypoxic respiratory failure in the setting of aspiration pneumonia, will start on IV Zosyn, now switched to oral antibiotics, as his fluid overload he was started on IV Lasix.  Assessment/Plan:   Acute respiratory failure with hypoxia due to aspiration pneumonia, Chest x-ray showed consolidation he was started on IV empiric antibiotics which has now been switched to oral. Try and wean him to room air. Continue to encourage incentive spirometry flutter valve. In the setting of intentional suicide attempt. He has been afebrile, will transition his IV Unasyn to Augmentin.  Bilateral lower extremity edema secondary to decompensated cirrhosis: Patient state he has been going on for several weeks transthoracic echo and venous Dopplers were negative. Now on oral Lasix and Aldactone creatinine has remained stable.  Continue to monitor creatinine and potassium.  Non-small cell left lower lobe carcinoma diagnosed in 07/2016; Followed by oncology as an outpatient. CT surgery evaluated him that showed no evidence of progressive disease.  Intentional opiate overdose with suicide attempt: Initially IVC placed on behavioral health. Psych was consulted who recommended geriatric psych inpatient admission. Continues to be IVC, continue suicidal precautions. Continue current psychiatric medications.  Alcohol abuse: Started on thiamine and folate continue to monitor with CIWA protocol.  Chronic pain: Takes oxycodone IR 20 mg every 6 hours,  follows with pain management clinic, confirmed only provider for pain management on [place] controlled substance reporting database followed outpatient by pain management   Stage I sacral decubitus ulcer present on admission: RN Pressure Injury Documentation: Pressure Injury 03/30/18 Stage I -  Intact skin with non-blanchable redness of a localized area usually over a bony prominence. (Active)  03/30/18 2000  Location: Sacrum  Location Orientation:   Staging: Stage I -  Intact skin with non-blanchable redness of a localized area usually over a bony prominence.  Wound Description (Comments):   Present on Admission: Yes    DVT prophylaxis: lovenox Family Communication:none Status is: Inpatient  Remains inpatient appropriate because:Hemodynamically unstable   Dispo: The patient is from: Home              Anticipated d/c is to: SNF              Anticipated d/c date is: 2 days              Patient currently is not medically stable to d/c.  Awaiting geriatric psych placement.     Code Status:     Code Status Orders  (From admission, onward)         Start     Ordered   09/27/19 1113  Full code  Continuous        09/27/19 1113        Code Status History    Date Active Date Inactive Code Status Order ID Comments User Context   09/27/2019 0911 09/27/2019 1113 Full Code [id number]  Davonna Belling, MD ED   04/03/2018 0854 04/10/2018 1716 Partial Code [id number]  Cristal Ford, DO Inpatient   03/30/2018 1955 04/03/2018 0854 Full Code [id number]  Cristal Ford, DO Inpatient  09/08/2016 1602 09/09/2016 1520 Full Code [id number]  Orbie Hurst Inpatient   07/02/2016 1456 07/03/2016 1654 Full Code [id number]  Elwin Mocha, MD ED   Advance Care Planning Activity        IV Access:    Peripheral IV   Procedures and diagnostic studies:   DG CHEST PORT 1 VIEW  Result Date: 10/02/2019 CLINICAL DATA:  Cough.  Decreased breath sounds EXAM: PORTABLE CHEST 1 VIEW  COMPARISON:  September 28, 2019 chest radiograph and chest CT FINDINGS: Pleural effusions are noted bilaterally. There is consolidation in the left base, likely due to a combination of pneumonia and compressive atelectasis. A lesser degree of suspected compressive atelectasis is noted in the right base. The nodular area which appears masslike on CT in the left upper lobe is again noted, measuring approximately 2 x 1.5 cm. No new opacity is appreciable. The heart size and pulmonary vascularity are normal. No adenopathy is appreciable. There is aortic atherosclerosis. No bone lesions. IMPRESSION: Stool effusions bilaterally. Combination of probable pneumonia and atelectasis left base with compressive atelectasis right base. Somewhat nodular appearing area which appears masslike on CT in the left upper lobe is again noted. No new opacity is evident. Stable cardiac silhouette. Aortic Atherosclerosis (ICD10-I70.0). Electronically Signed   By: Lowella Grip III M.D.   On: 10/02/2019 13:57     Medical Consultants:    None.  Anti-Infectives:   none  Subjective:    Juel Burrow no complains.  Objective:    Vitals:   10/03/19 0922 10/03/19 2017 10/04/19 0036 10/04/19 0500  BP: (!) 149/74 (!) 141/72  (!) 147/74  Pulse: 78 83  82  Resp:  18  18  Temp:  98.6 F (37 C)  98.2 F (36.8 C)  TempSrc:  Oral  Oral  SpO2:  95%  93%  Weight:   80.9 kg   Height:       SpO2: 93 % O2 Flow Rate (L/min): 1 L/min   Intake/Output Summary (Last 24 hours) at 10/04/2019 1000 Last data filed at 10/04/2019 0643 Gross per 24 hour  Intake 240 ml  Output 1150 ml  Net -910 ml   Filed Weights   10/02/19 0416 10/03/19 0626 10/04/19 0036  Weight: 82.7 kg 81 kg 80.9 kg    Exam: General exam: In no acute distress. Respiratory system: Good air movement and clear to auscultation. Cardiovascular system: S1 & S2 heard, RRR. No JVD. Gastrointestinal system: Abdomen is nondistended, soft and nontender.    Extremities: No pedal edema. Skin: No rashes, lesions or ulcers   Data Reviewed:    Labs: Basic Metabolic Panel: Recent Labs  Lab 09/28/19 0457 09/28/19 0457 09/29/19 0235 09/29/19 0235 10/01/19 0721 10/01/19 0721 10/02/19 0526 10/02/19 0948 10/03/19 0608  NA 140  --  139  --  136  --  137  --  135  K 4.1   < > 4.0   < > 3.3*   < > 3.3*  --  4.2  CL 100  --  101  --  98  --  97*  --  98  CO2 29  --  30  --  28  --  30  --  28  GLUCOSE 88  --  100*  --  106*  --  109*  --  95  BUN 15  --  12  --  8  --  10  --  14  CREATININE 1.17  --  0.97  --  0.80  --  0.78  --  0.86  CALCIUM 8.4*  --  8.3*  --  8.5*  --  8.7*  --  8.8*  MG  --   --   --   --   --   --   --  1.7  --    < > = values in this interval not displayed.   GFR Estimated Creatinine Clearance: 77.8 mL/min (by C-G formula based on SCr of 0.86 mg/dL). Liver Function Tests: Recent Labs  Lab 10/01/19 0721 10/02/19 0526 10/03/19 0608  AST 26 30 44*  ALT 14 16 25   ALKPHOS 55 57 58  BILITOT 0.5 0.4 0.4  PROT 6.6 6.8 6.9  ALBUMIN 2.7* 2.8* 3.0*   No results for input(s): LIPASE, AMYLASE in the last 168 hours. No results for input(s): AMMONIA in the last 168 hours. Coagulation profile Recent Labs  Lab 09/29/19 1222  INR 1.1   COVID-19 Labs  No results for input(s): DDIMER, FERRITIN, LDH, CRP in the last 72 hours.  Lab Results  Component Value Date   [place] NEGATIVE 09/27/2019    CBC: Recent Labs  Lab 09/28/19 0457 09/29/19 0235 09/30/19 0627 10/01/19 0721  WBC 6.4 6.5 7.2 6.3  NEUTROABS 5.1 5.3 5.5 4.8  HGB 9.1* 8.9* 9.7* 9.7*  HCT 32.1* 31.8* 33.4* 33.2*  MCV 85.4 83.7 83.5 82.4  PLT 258 250 269 251   Cardiac Enzymes: No results for input(s): CKTOTAL, CKMB, CKMBINDEX, TROPONINI in the last 168 hours. BNP (last 3 results) No results for input(s): PROBNP in the last 8760 hours. CBG: No results for input(s): GLUCAP in the last 168 hours. D-Dimer: No results for input(s): DDIMER  in the last 72 hours. Hgb A1c: No results for input(s): HGBA1C in the last 72 hours. Lipid Profile: No results for input(s): CHOL, HDL, LDLCALC, TRIG, CHOLHDL, LDLDIRECT in the last 72 hours. Thyroid function studies: No results for input(s): TSH, T4TOTAL, T3FREE, THYROIDAB in the last 72 hours.  Invalid input(s): FREET3 Anemia work up: No results for input(s): VITAMINB12, FOLATE, FERRITIN, TIBC, IRON, RETICCTPCT in the last 72 hours. Sepsis Labs: Recent Labs  Lab 09/28/19 0457 09/29/19 0235 09/30/19 0627 10/01/19 0721  WBC 6.4 6.5 7.2 6.3   Microbiology Recent Results (from the past 240 hour(s))  SARS Coronavirus 2 by RT PCR (hospital order, performed in [facility] hospital lab) Nasopharyngeal Nasopharyngeal Swab     Status: None   Collection Time: 09/27/19  4:18 AM   Specimen: Nasopharyngeal Swab  Result Value Ref Range Status   SARS Coronavirus 2 NEGATIVE NEGATIVE Final    Comment: (NOTE) SARS-CoV-2 target nucleic acids are NOT DETECTED.  The SARS-CoV-2 RNA is generally detectable in upper and lower respiratory specimens during the acute phase of infection. The lowest concentration of SARS-CoV-2 viral copies this assay can detect is 250 copies / mL. A negative result does not preclude SARS-CoV-2 infection and should not be used as the sole basis for treatment or other patient management decisions.  A negative result may occur with improper specimen collection / handling, submission of specimen other than nasopharyngeal swab, presence of viral mutation(s) within the areas targeted by this assay, and inadequate number of viral copies (<250 copies / mL). A negative result must be combined with clinical observations, patient history, and epidemiological information.  Fact Sheet for Patients:   StrictlyIdeas.no  Fact Sheet for Healthcare Providers: BankingDealers.co.za  This test is not yet approved or  cleared by the [place] FDA  and has been authorized for detection and/or diagnosis of SARS-CoV-2 by FDA under an Emergency Use Authorization (EUA).  This EUA will remain in effect (meaning this test can be used) for the duration of the COVID-19 declaration under Section 564(b)(1) of the Act, 21 U.S.C. section 360bbb-3(b)(1), unless the authorization is terminated or revoked sooner.  Performed at [facility], [place] [address], [place], [place] [address]   Blood culture (routine x 2)     Status: None   Collection Time: 09/27/19  5:04 AM   Specimen: BLOOD RIGHT HAND  Result Value Ref Range Status   Specimen Description BLOOD RIGHT HAND  Final   Special Requests   Final    BOTTLES DRAWN AEROBIC AND ANAEROBIC Blood Culture results may not be optimal due to an inadequate volume of blood received in culture bottles   Culture   Final    NO GROWTH 5 DAYS Performed at [facility], [place] [address] [place], [place] [address]    Report Status 10/02/2019 FINAL  Final  Blood culture (routine x 2)     Status: None   Collection Time: 09/27/19  5:04 AM   Specimen: BLOOD LEFT HAND  Result Value Ref Range Status   Specimen Description BLOOD LEFT HAND  Final   Special Requests   Final    BOTTLES DRAWN AEROBIC AND ANAEROBIC Blood Culture results may not be optimal due to an inadequate volume of blood received in culture bottles   Culture   Final    NO GROWTH 5 DAYS Performed at [facility], [place] [address], [place], [place] [address]    Report Status 10/02/2019 FINAL  Final     Medications:   . amLODipine  10 mg Oral Daily  . amoxicillin-clavulanate  1 tablet Oral Q12H  . docusate sodium  100 mg Oral BID  . enoxaparin (LOVENOX) injection  40 mg Subcutaneous Daily  . folic acid  1 mg Oral Daily  . furosemide  20 mg Oral Daily  . guaiFENesin  600 mg Oral BID  . multivitamin with minerals  1 tablet Oral Daily  . nicotine  14 mg Transdermal Daily  . pantoprazole  40 mg Oral BID  .  spironolactone  50 mg Oral Daily  . thiamine  100 mg Oral Daily   Or  . thiamine  100 mg Intravenous Daily   Continuous Infusions:    LOS: 6 days   Charlynne Cousins  [facility]  10/04/2019, 10:00 AM

## 2019-10-05 NOTE — Plan of Care (Signed)
  Problem: Clinical Measurements: Goal: Respiratory complications will improve Outcome: Progressing   Problem: Safety: Goal: Ability to remain free from injury will improve Outcome: Progressing   

## 2019-10-05 NOTE — Social Work (Signed)
CSW called [facility], unit is under quarantine, called [facility] and was told that there is a bed available but they do not accept pt's with oxygen.

## 2019-10-05 NOTE — Progress Notes (Signed)
[facility] PROGRESS NOTE    Progress Note  Tyler Mcmillan  OHY:[id number] DOB: [birth year] DOA: 09/27/2019 PCP: [name], MD     Brief Narrative:   Tyler Mcmillan is an 74 y.o. male past medical history significant for alcohol abuse, none small cell lung cancer status post directed radiotherapy essential hypertension AAA repair presents on 09/27/2019 with intentional overdose of oxycodone for suicidal ideation was found to be in acute hypoxic respiratory failure in the setting of aspiration pneumonia, will start on IV Zosyn, now switched to oral antibiotics, as his fluid overload he was started on IV Lasix.  Assessment/Plan:   Acute respiratory failure with hypoxia due to aspiration pneumonia, has completed his course of antibiotic. Try and wean him to room air. Continue to encourage incentive spirometry flutter valve. In the setting of intentional suicide attempt. He has been afebrile, will transition his IV Unasyn to Augmentin.  Bilateral lower extremity edema secondary to decompensated cirrhosis: Now on oral Lasix and Aldactone creatinine has remained stable.  Diuresing well continue to follow strict I's and O's.  Non-small cell left lower lobe carcinoma diagnosed in 07/2016; Followed by oncology as an outpatient. CT surgery evaluated him that showed no evidence of progressive disease.  Intentional opiate overdose with suicide attempt: Initially IVC placed on behavioral health. Psych was consulted who recommended geriatric psych inpatient admission. Continues to be IVC, continue suicidal precautions. Continue current psychiatric medications.  Alcohol abuse: Started on thiamine and folate continue to monitor with CIWA protocol.  Chronic pain: Takes oxycodone IR 20 mg every 6 hours, follows with pain management clinic, confirmed only provider for pain management on [place] controlled substance reporting database followed outpatient by pain  management   Stage I sacral decubitus ulcer present on admission: RN Pressure Injury Documentation: Pressure Injury 03/30/18 Stage I -  Intact skin with non-blanchable redness of a localized area usually over a bony prominence. (Active)  03/30/18 2000  Location: Sacrum  Location Orientation:   Staging: Stage I -  Intact skin with non-blanchable redness of a localized area usually over a bony prominence.  Wound Description (Comments):   Present on Admission: Yes    DVT prophylaxis: lovenox Family Communication:none Status is: Inpatient  Remains inpatient appropriate because:Hemodynamically unstable   Dispo: The patient is from: Home              Anticipated d/c is to: SNF              Anticipated d/c date is: 2 days              Patient currently is not medically stable to d/c.  Awaiting geriatric psych placement.     Code Status:     Code Status Orders  (From admission, onward)         Start     Ordered   09/27/19 1113  Full code  Continuous        09/27/19 1113        Code Status History    Date Active Date Inactive Code Status Order ID Comments User Context   09/27/2019 0911 09/27/2019 1113 Full Code [id number]  Davonna Belling, MD ED   04/03/2018 0854 04/10/2018 1716 Partial Code [id number]  Cristal Ford, DO Inpatient   03/30/2018 1955 04/03/2018 0854 Full Code [id number]  Cristal Ford, DO Inpatient   09/08/2016 1602 09/09/2016 1520 Full Code [id number]  Orbie Hurst Inpatient   07/02/2016 1456 07/03/2016 1654 Full Code [id number]  Benjaman Lobe  M, MD ED   Advance Care Planning Activity        IV Access:    Peripheral IV   Procedures and diagnostic studies:   No results found.   Medical Consultants:    None.  Anti-Infectives:   none  Subjective:    Tyler Mcmillan no complains.  Objective:    Vitals:   10/05/19 0228 10/05/19 0500 10/05/19 0824 10/05/19 0824  BP:  139/76 (!) 120/58 (!) 120/58  Pulse:  79  87  Resp:  18     Temp:  98.9 F (37.2 C)    TempSrc:  Oral    SpO2:  97%    Weight: 80 kg     Height:       SpO2: 97 % O2 Flow Rate (L/min): 1 L/min   Intake/Output Summary (Last 24 hours) at 10/05/2019 0838 Last data filed at 10/05/2019 0752 Gross per 24 hour  Intake 1030 ml  Output 1151 ml  Net -121 ml   Filed Weights   10/03/19 0626 10/04/19 0036 10/05/19 0228  Weight: 81 kg 80.9 kg 80 kg    Exam: General exam: In no acute distress. Respiratory system: Good air movement and clear to auscultation. Cardiovascular system: S1 & S2 heard, RRR. No JVD. Gastrointestinal system: Abdomen is nondistended, soft and nontender.  Extremities: No pedal edema. Skin: No rashes, lesions or ulcers   Data Reviewed:    Labs: Basic Metabolic Panel: Recent Labs  Lab 09/29/19 0235 09/29/19 0235 10/01/19 0721 10/01/19 0721 10/02/19 0526 10/02/19 0948 10/03/19 0608  NA 139  --  136  --  137  --  135  K 4.0   < > 3.3*   < > 3.3*  --  4.2  CL 101  --  98  --  97*  --  98  CO2 30  --  28  --  30  --  28  GLUCOSE 100*  --  106*  --  109*  --  95  BUN 12  --  8  --  10  --  14  CREATININE 0.97  --  0.80  --  0.78  --  0.86  CALCIUM 8.3*  --  8.5*  --  8.7*  --  8.8*  MG  --   --   --   --   --  1.7  --    < > = values in this interval not displayed.   GFR Estimated Creatinine Clearance: 77.8 mL/min (by C-G formula based on SCr of 0.86 mg/dL). Liver Function Tests: Recent Labs  Lab 10/01/19 0721 10/02/19 0526 10/03/19 0608  AST 26 30 44*  ALT 14 16 25   ALKPHOS 55 57 58  BILITOT 0.5 0.4 0.4  PROT 6.6 6.8 6.9  ALBUMIN 2.7* 2.8* 3.0*   No results for input(s): LIPASE, AMYLASE in the last 168 hours. No results for input(s): AMMONIA in the last 168 hours. Coagulation profile Recent Labs  Lab 09/29/19 1222  INR 1.1   COVID-19 Labs  No results for input(s): DDIMER, FERRITIN, LDH, CRP in the last 72 hours.  Lab Results  Component Value Date   [place] NEGATIVE 09/27/2019     CBC: Recent Labs  Lab 09/29/19 0235 09/30/19 0627 10/01/19 0721  WBC 6.5 7.2 6.3  NEUTROABS 5.3 5.5 4.8  HGB 8.9* 9.7* 9.7*  HCT 31.8* 33.4* 33.2*  MCV 83.7 83.5 82.4  PLT 250 269 251   Cardiac Enzymes: No results for input(s): CKTOTAL, CKMB,  CKMBINDEX, TROPONINI in the last 168 hours. BNP (last 3 results) No results for input(s): PROBNP in the last 8760 hours. CBG: No results for input(s): GLUCAP in the last 168 hours. D-Dimer: No results for input(s): DDIMER in the last 72 hours. Hgb A1c: No results for input(s): HGBA1C in the last 72 hours. Lipid Profile: No results for input(s): CHOL, HDL, LDLCALC, TRIG, CHOLHDL, LDLDIRECT in the last 72 hours. Thyroid function studies: No results for input(s): TSH, T4TOTAL, T3FREE, THYROIDAB in the last 72 hours.  Invalid input(s): FREET3 Anemia work up: No results for input(s): VITAMINB12, FOLATE, FERRITIN, TIBC, IRON, RETICCTPCT in the last 72 hours. Sepsis Labs: Recent Labs  Lab 09/29/19 0235 09/30/19 0627 10/01/19 0721  WBC 6.5 7.2 6.3   Microbiology Recent Results (from the past 240 hour(s))  SARS Coronavirus 2 by RT PCR (hospital order, performed in [facility] hospital lab) Nasopharyngeal Nasopharyngeal Swab     Status: None   Collection Time: 09/27/19  4:18 AM   Specimen: Nasopharyngeal Swab  Result Value Ref Range Status   SARS Coronavirus 2 NEGATIVE NEGATIVE Final    Comment: (NOTE) SARS-CoV-2 target nucleic acids are NOT DETECTED.  The SARS-CoV-2 RNA is generally detectable in upper and lower respiratory specimens during the acute phase of infection. The lowest concentration of SARS-CoV-2 viral copies this assay can detect is 250 copies / mL. A negative result does not preclude SARS-CoV-2 infection and should not be used as the sole basis for treatment or other patient management decisions.  A negative result may occur with improper specimen collection / handling, submission of specimen other than  nasopharyngeal swab, presence of viral mutation(s) within the areas targeted by this assay, and inadequate number of viral copies (<250 copies / mL). A negative result must be combined with clinical observations, patient history, and epidemiological information.  Fact Sheet for Patients:   StrictlyIdeas.no  Fact Sheet for Healthcare Providers: BankingDealers.co.za  This test is not yet approved or  cleared by the [place] FDA and has been authorized for detection and/or diagnosis of SARS-CoV-2 by FDA under an Emergency Use Authorization (EUA).  This EUA will remain in effect (meaning this test can be used) for the duration of the COVID-19 declaration under Section 564(b)(1) of the Act, 21 U.S.C. section 360bbb-3(b)(1), unless the authorization is terminated or revoked sooner.  Performed at [facility], [place] [address], [place], [place] [address]   Blood culture (routine x 2)     Status: None   Collection Time: 09/27/19  5:04 AM   Specimen: BLOOD RIGHT HAND  Result Value Ref Range Status   Specimen Description BLOOD RIGHT HAND  Final   Special Requests   Final    BOTTLES DRAWN AEROBIC AND ANAEROBIC Blood Culture results may not be optimal due to an inadequate volume of blood received in culture bottles   Culture   Final    NO GROWTH 5 DAYS Performed at [facility], [place] [address], [place], [place] [address]    Report Status 10/02/2019 FINAL  Final  Blood culture (routine x 2)     Status: None   Collection Time: 09/27/19  5:04 AM   Specimen: BLOOD LEFT HAND  Result Value Ref Range Status   Specimen Description BLOOD LEFT HAND  Final   Special Requests   Final    BOTTLES DRAWN AEROBIC AND ANAEROBIC Blood Culture results may not be optimal due to an inadequate volume of blood received in culture bottles  Culture   Final    NO GROWTH 5 DAYS Performed at [facility], [place] [address] [place], [place]  [address]    Report Status 10/02/2019 FINAL  Final     Medications:   . amLODipine  10 mg Oral Daily  . docusate sodium  100 mg Oral BID  . enoxaparin (LOVENOX) injection  40 mg Subcutaneous Daily  . folic acid  1 mg Oral Daily  . furosemide  20 mg Oral Daily  . guaiFENesin  600 mg Oral BID  . multivitamin with minerals  1 tablet Oral Daily  . nicotine  14 mg Transdermal Daily  . pantoprazole  40 mg Oral BID  . spironolactone  50 mg Oral Daily  . thiamine  100 mg Oral Daily   Or  . thiamine  100 mg Intravenous Daily   Continuous Infusions:    LOS: 7 days   Charlynne Cousins  [facility]  10/05/2019, 8:38 AM

## 2019-10-06 LAB — SARS CORONAVIRUS 2 BY RT PCR (HOSPITAL ORDER, PERFORMED IN ~~LOC~~ HOSPITAL LAB): SARS Coronavirus 2: NEGATIVE

## 2019-10-06 NOTE — Progress Notes (Signed)
[facility] PROGRESS NOTE    Progress Note  Tyler Mcmillan  GHW:[id number] DOB: 03-Dec-1945 DOA: 09/27/2019 PCP: [name], MD     Brief Narrative:   Tyler Mcmillan is an 74 y.o. male past medical history significant for alcohol abuse, none small cell lung cancer status post directed radiotherapy essential hypertension AAA repair presents on 09/27/2019 with intentional overdose of oxycodone for suicidal ideation was found to be in acute hypoxic respiratory failure in the setting of aspiration pneumonia, will start on IV Zosyn, now switched to oral antibiotics, as his fluid overload he was started on IV Lasix.  Assessment/Plan:   Acute respiratory failure with hypoxia due to aspiration pneumonia, has completed his course of antibiotic. Try and wean him to room air. Continue to encourage incentive spirometry flutter valve. In the setting of intentional suicide attempt. He has been afebrile, will transition his IV Unasyn to Augmentin.  Bilateral lower extremity edema secondary to decompensated cirrhosis: Now on oral Lasix and Aldactone creatinine has remained stable.  Diuresing well continue to follow strict I's and O's.  Non-small cell left lower Mcmillan carcinoma diagnosed in 07/2016; Followed by oncology as an outpatient. CT surgery evaluated him that showed no evidence of progressive disease.  Intentional opiate overdose with suicide attempt: Initially IVC placed on behavioral health. Psych was consulted who recommended geriatric psych inpatient admission. Continues to be IVC, continue suicidal precautions. Continue current psychiatric medications.  Alcohol abuse: Started on thiamine and folate continue to monitor with CIWA protocol.  Chronic pain: Takes oxycodone IR 20 mg every 6 hours, follows with pain management clinic, confirmed only provider for pain management on [place] controlled substance reporting database followed outpatient by pain  management   Stage I sacral decubitus ulcer present on admission: RN Pressure Injury Documentation: Pressure Injury 03/30/18 Stage I -  Intact skin with non-blanchable redness of a localized area usually over a bony prominence. (Active)  03/30/18 2000  Location: Sacrum  Location Orientation:   Staging: Stage I -  Intact skin with non-blanchable redness of a localized area usually over a bony prominence.  Wound Description (Comments):   Present on Admission: Yes    DVT prophylaxis: lovenox Family Communication:none Status is: Inpatient  Remains inpatient appropriate because:Hemodynamically unstable   Dispo: The patient is from: Home              Anticipated d/c is to: SNF              Anticipated d/c date is: 2 days              Patient currently is not medically stable to d/c.  Awaiting geriatric psych placement.     Code Status:     Code Status Orders  (From admission, onward)         Start     Ordered   09/27/19 1113  Full code  Continuous        09/27/19 1113        Code Status History    Date Active Date Inactive Code Status Order ID Comments User Context   09/27/2019 0911 09/27/2019 1113 Full Code [id number]  Davonna Belling, MD ED   04/03/2018 0854 04/10/2018 1716 Partial Code [id number]  Cristal Ford, DO Inpatient   03/30/2018 1955 04/03/2018 0854 Full Code [id number]  Cristal Ford, DO Inpatient   09/08/2016 1602 09/09/2016 1520 Full Code [id number]  Orbie Hurst Inpatient   07/02/2016 1456 07/03/2016 1654 Full Code [id number]  Tyler Mcmillan  M, MD ED   Advance Care Planning Activity        IV Access:    Peripheral IV   Procedures and diagnostic studies:   No results found.   Medical Consultants:    None.  Anti-Infectives:   none  Subjective:    Juel Burrow no complains.  Objective:    Vitals:   10/05/19 1658 10/05/19 2106 10/06/19 0522 10/06/19 0826  BP: 133/64 135/66 136/67 124/63  Pulse: 73 80 68 74  Resp: 20  16 18 16   Temp: 98.6 F (37 C) 98.7 F (37.1 C) 98.7 F (37.1 C)   TempSrc: Oral Oral Oral   SpO2: 97% 92% 96% 96%  Weight:   79.2 kg   Height:       SpO2: 96 % O2 Flow Rate (L/min): 1 L/min   Intake/Output Summary (Last 24 hours) at 10/06/2019 1004 Last data filed at 10/06/2019 0830 Gross per 24 hour  Intake 920 ml  Output 500 ml  Net 420 ml   Filed Weights   10/04/19 0036 10/05/19 0228 10/06/19 0522  Weight: 80.9 kg 80 kg 79.2 kg    Exam: General exam: In no acute distress. Respiratory system: Good air movement and clear to auscultation. Cardiovascular system: S1 & S2 heard, RRR. No JVD. Gastrointestinal system: Abdomen is nondistended, soft and nontender.  Extremities: No pedal edema. Skin: No rashes, lesions or ulcers   Data Reviewed:    Labs: Basic Metabolic Panel: Recent Labs  Lab 10/01/19 0721 10/01/19 0721 10/02/19 0526 10/02/19 0948 10/03/19 0608  NA 136  --  137  --  135  K 3.3*   < > 3.3*  --  4.2  CL 98  --  97*  --  98  CO2 28  --  30  --  28  GLUCOSE 106*  --  109*  --  95  BUN 8  --  10  --  14  CREATININE 0.80  --  0.78  --  0.86  CALCIUM 8.5*  --  8.7*  --  8.8*  MG  --   --   --  1.7  --    < > = values in this interval not displayed.   GFR Estimated Creatinine Clearance: 77.8 mL/min (by C-G formula based on SCr of 0.86 mg/dL). Liver Function Tests: Recent Labs  Lab 10/01/19 0721 10/02/19 0526 10/03/19 0608  AST 26 30 44*  ALT 14 16 25   ALKPHOS 55 57 58  BILITOT 0.5 0.4 0.4  PROT 6.6 6.8 6.9  ALBUMIN 2.7* 2.8* 3.0*   No results for input(s): LIPASE, AMYLASE in the last 168 hours. No results for input(s): AMMONIA in the last 168 hours. Coagulation profile Recent Labs  Lab 09/29/19 1222  INR 1.1   COVID-19 Labs  No results for input(s): DDIMER, FERRITIN, LDH, CRP in the last 72 hours.  Lab Results  Component Value Date   [place] NEGATIVE 09/27/2019    CBC: Recent Labs  Lab 09/30/19 0627 10/01/19 0721  WBC  7.2 6.3  NEUTROABS 5.5 4.8  HGB 9.7* 9.7*  HCT 33.4* 33.2*  MCV 83.5 82.4  PLT 269 251   Cardiac Enzymes: No results for input(s): CKTOTAL, CKMB, CKMBINDEX, TROPONINI in the last 168 hours. BNP (last 3 results) No results for input(s): PROBNP in the last 8760 hours. CBG: No results for input(s): GLUCAP in the last 168 hours. D-Dimer: No results for input(s): DDIMER in the last 72 hours. Hgb A1c: No results  for input(s): HGBA1C in the last 72 hours. Lipid Profile: No results for input(s): CHOL, HDL, LDLCALC, TRIG, CHOLHDL, LDLDIRECT in the last 72 hours. Thyroid function studies: No results for input(s): TSH, T4TOTAL, T3FREE, THYROIDAB in the last 72 hours.  Invalid input(s): FREET3 Anemia work up: No results for input(s): VITAMINB12, FOLATE, FERRITIN, TIBC, IRON, RETICCTPCT in the last 72 hours. Sepsis Labs: Recent Labs  Lab 09/30/19 0627 10/01/19 0721  WBC 7.2 6.3   Microbiology Recent Results (from the past 240 hour(s))  SARS Coronavirus 2 by RT PCR (hospital order, performed in [facility] hospital lab) Nasopharyngeal Nasopharyngeal Swab     Status: None   Collection Time: 09/27/19  4:18 AM   Specimen: Nasopharyngeal Swab  Result Value Ref Range Status   SARS Coronavirus 2 NEGATIVE NEGATIVE Final    Comment: (NOTE) SARS-CoV-2 target nucleic acids are NOT DETECTED.  The SARS-CoV-2 RNA is generally detectable in upper and lower respiratory specimens during the acute phase of infection. The lowest concentration of SARS-CoV-2 viral copies this assay can detect is 250 copies / mL. A negative result does not preclude SARS-CoV-2 infection and should not be used as the sole basis for treatment or other patient management decisions.  A negative result may occur with improper specimen collection / handling, submission of specimen other than nasopharyngeal swab, presence of viral mutation(s) within the areas targeted by this assay, and inadequate number of viral  copies (<250 copies / mL). A negative result must be combined with clinical observations, patient history, and epidemiological information.  Fact Sheet for Patients:   StrictlyIdeas.no  Fact Sheet for Healthcare Providers: BankingDealers.co.za  This test is not yet approved or  cleared by the [place] FDA and has been authorized for detection and/or diagnosis of SARS-CoV-2 by FDA under an Emergency Use Authorization (EUA).  This EUA will remain in effect (meaning this test can be used) for the duration of the COVID-19 declaration under Section 564(b)(1) of the Act, 21 U.S.C. section 360bbb-3(b)(1), unless the authorization is terminated or revoked sooner.  Performed at [facility], [place] [address] [place], [place] [address]   Blood culture (routine x 2)     Status: None   Collection Time: 09/27/19  5:04 AM   Specimen: BLOOD RIGHT HAND  Result Value Ref Range Status   Specimen Description BLOOD RIGHT HAND  Final   Special Requests   Final    BOTTLES DRAWN AEROBIC AND ANAEROBIC Blood Culture results may not be optimal due to an inadequate volume of blood received in culture bottles   Culture   Final    NO GROWTH 5 DAYS Performed at [facility], [place] [address] [place], [place] [address]    Report Status 10/02/2019 FINAL  Final  Blood culture (routine x 2)     Status: None   Collection Time: 09/27/19  5:04 AM   Specimen: BLOOD LEFT HAND  Result Value Ref Range Status   Specimen Description BLOOD LEFT HAND  Final   Special Requests   Final    BOTTLES DRAWN AEROBIC AND ANAEROBIC Blood Culture results may not be optimal due to an inadequate volume of blood received in culture bottles   Culture   Final    NO GROWTH 5 DAYS Performed at [facility], [place] [address], [place], [place] [address]    Report Status 10/02/2019 FINAL  Final     Medications:   . amLODipine  10 mg Oral Daily  . docusate  sodium  100  mg Oral BID  . enoxaparin (LOVENOX) injection  40 mg Subcutaneous Daily  . folic acid  1 mg Oral Daily  . furosemide  20 mg Oral Daily  . guaiFENesin  600 mg Oral BID  . multivitamin with minerals  1 tablet Oral Daily  . nicotine  14 mg Transdermal Daily  . pantoprazole  40 mg Oral BID  . spironolactone  50 mg Oral Daily  . thiamine  100 mg Oral Daily   Or  . thiamine  100 mg Intravenous Daily   Continuous Infusions:    LOS: 8 days   Charlynne Cousins  [facility]  10/06/2019, 10:04 AM

## 2019-10-06 NOTE — TOC Initial Note (Signed)
Transition of Care [facility]) - Initial/Assessment Note    Patient Details  Name: Tyler Mcmillan MRN: [id number] Date of Birth: [birth year]  Transition of Care [facility]) CM/SW Contact:    [address], [place] Phone Number: 10/06/2019, 4:33 PM  Clinical Narrative:                 CSW spoke with patient regarding possible dc to geripsych facility. Pt declined placement into psychiatric facility as he stated he wasn't trying to harm himself and that he wanted an advocate to help him and "go to bat for him". CSW adv that this is the recommendation of the doctor and he will be IVC'd if he doesn't go on his own. Pt gave permission to speak with Shanon Brow [phone], spoke with him and he requested that doc call him back. Adv that pt may be able to go to [facility] in [place] to send over information.    Expected Discharge Plan: Acute to Acute Transfer Barriers to Discharge: Continued Medical Work up, Ship broker   Patient Goals and CMS Choice   CMS Medicare.gov Compare Post Acute Care list provided to:: Patient Represenative (must comment) Shanon Brow [phone]) Choice offered to / list presented to : Adult Children  Expected Discharge Plan and Services Expected Discharge Plan: Acute to Acute Transfer                                              Prior Living Arrangements/Services   Lives with:: Self Patient language and need for interpreter reviewed:: Yes Do you feel safe going back to the place where you live?: Yes      Need for Family Participation in Patient Care: Yes (Comment) Care giver support system in place?: No (comment)   Criminal Activity/Legal Involvement Pertinent to Current Situation/Hospitalization: No - Comment as needed  Activities of Daily Living      Permission Sought/Granted Permission sought to share information with : Facility Sport and exercise psychologist, Family Supports Permission granted to share information with : Yes,  Verbal Permission Granted  Share Information with NAME: Shanon Brow  Permission granted to share info w AGENCY: [place] granted to share info w Relationship: Son  Permission granted to share info w Contact Information: [phone]  Emotional Assessment Appearance:: Appears stated age Attitude/Demeanor/Rapport: Angry, Suspicious, Avoidant Affect (typically observed): Frustrated, Agitated Orientation: : Oriented to Self, Oriented to Place, Oriented to  Time, Oriented to Situation Alcohol / Substance Use: Tobacco Use, Alcohol Use Psych Involvement: Yes (comment) (Psych recommended inpatient)  Admission diagnosis:  Pleural effusion [J90] Hypoxia [R09.02] Acute respiratory failure with hypoxia ([place]) [J96.01] Suicide attempt by substance overdose, initial encounter ([place]) [T65.92XA] Aspiration pneumonia ([place]) [J69.0] Patient Active Problem List   Diagnosis Date Noted  . Hypokalemia 10/02/2019  . Other cirrhosis of liver ([place]) 09/30/2019  . Aspiration pneumonia ([place]) 09/28/2019  . Acute respiratory failure with hypoxia ([place]) 09/27/2019  . Intentional overdose of drug in tablet form ([place]) 09/27/2019  . Alcohol abuse 09/27/2019  . Tobacco dependence 09/27/2019  . Acute on chronic respiratory failure with hypoxia ([place]) 04/06/2018  . Malnutrition of moderate degree 04/05/2018  . Pressure injury of skin 04/05/2018  . GI bleed 03/31/2018  . Acute post-hemorrhagic anemia   . Malignant neoplasm metastatic to left lung ([place])   . Peripheral edema   . Pleural effusion on left   .  Pleural effusion, left   . Fall at home, initial encounter   . Generalized weakness   . Acute GI bleeding 03/30/2018  . Malignant neoplasm of bronchus of left upper lobe ([place]) 08/09/2016  . Hypertensive heart disease without heart failure   . Chest pain 07/02/2016  . Cavitary lesion of lung 07/02/2016  . Unstable angina ([place])   . AAA (abdominal aortic aneurysm) ([place]) 07/09/2014  .  Lumbar degenerative disc disease 07/09/2014   PCP:  [name], MD Pharmacy:   [facility], [place] [address] Phone: [phone] Fax: [phone]     Social Determinants of Health (SDOH) Interventions    Readmission Risk Interventions No flowsheet data found.

## 2019-10-07 DIAGNOSIS — T1491XA Suicide attempt, initial encounter: Secondary | ICD-10-CM

## 2019-10-07 DIAGNOSIS — T6592XA Toxic effect of unspecified substance, intentional self-harm, initial encounter: Secondary | ICD-10-CM

## 2019-10-07 DIAGNOSIS — E876 Hypokalemia: Secondary | ICD-10-CM

## 2019-10-07 MED ORDER — AMLODIPINE BESYLATE 10 MG PO TABS: 10.0000 mg | ORAL_TABLET | Freq: Every day | ORAL | Status: DC

## 2019-10-07 MED ORDER — SPIRONOLACTONE 50 MG PO TABS
50.0000 mg | ORAL_TABLET | Freq: Every day | ORAL | 3 refills | Status: DC
Start: 2019-10-08 — End: 2019-10-13

## 2019-10-07 MED ORDER — DOCUSATE SODIUM 100 MG PO CAPS: 100.0000 mg | ORAL_CAPSULE | Freq: Two times a day (BID) | ORAL | 0 refills | Status: DC

## 2019-10-07 MED ORDER — FUROSEMIDE 20 MG PO TABS
20.0000 mg | ORAL_TABLET | Freq: Every day | ORAL | Status: DC
Start: 2019-10-07 — End: 2019-10-13

## 2019-10-07 NOTE — Consult Note (Signed)
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 male patient who reports prior history of Depression, Anxiety, PTSD who was admitted after he intentionally attempted suicide by overdosing on his medications. Patient reports that he became depressed after his wife died in 03-Aug-2019:" I lost everything when my wife died, she was the only thing I had to leave for, I took the pills to not wake up''. In addition, patient reports financial issues, multiple medical problem and being lonely since his wife passed as part of his stressors. Today, patient is alert, cooperative but still depressed and unable to contract for safety. He denies prior history of suicide attempt but patient is at a very high risk for suicide based on his age, past history of depression, social isolation after the death of his wife, chronic medical problem, bereavement and financial issues.   Medical history: SCC of left lung (2018); HTN;  bladder cancer; and AAA   Past Psychiatric History: Anxiety, depression, PTSD.  Reports multiple trials of oral antidepressant medication, all  of which were ineffective.  He is unable to recall the names of these particular medications.   Risk to Self:  unable to contract for safety Risk to Others:  denies Prior Inpatient Therapy:   Prior Outpatient Therapy:    Past Medical History:  Past Medical History:  Diagnosis Date  . AAA (abdominal aortic aneurysm) without rupture ([place])    [place] ABD.  DONE  JUNE 2014  3.5  . Bladder cancer ([place])   . Chronic anxiety   . Chronic pain   . GERD (gastroesophageal reflux disease)   . Hypertension   . 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 ABDOMINAL AORTIC ENDOVASCULAR STENT GRAFT N/A 09/08/2016   Procedure: ABDOMINAL AORTIC ENDOVASCULAR STENT GRAFT;  Surgeon: [name], MD;  Location: [facility];  Service: Vascular;  Laterality: N/A;  . BIOPSY  03/31/2018   Procedure: BIOPSY;  Surgeon: Wonda Horner, MD;  Location: [facility]COPY;  Service: Endoscopy;;  . CHEST TUBE INSERTION Left 04/07/2018   Procedure: INSERTION PLEURAL DRAINAGE CATHETER USING ULTRASOUND AND FLURO GUIDANCE;  Surgeon: Grace Isaac, MD;  Location: [facility];  Service: Thoracic;  Laterality: Left;  . CYSTOSCOPY W/ URETERAL STENT PLACEMENT Left 10/04/2012   Procedure: CYSTOSCOPY WITH RETROGRADE PYELOGRAM/URETERAL STENT PLACEMENT   "POSSIBLE LEFT STENT";  Surgeon: Alexis Frock, MD;  Location[facility];  Service: Urology;  Laterality: Left;  . ESOPHAGOGASTRODUODENOSCOPY (EGD) WITH PROPOFOL N/A 03/31/2018   Procedure: ESOPHAGOGASTRODUODENOSCOPY (EGD) WITH PROPOFOL;  Surgeon: Wonda Horner, MD;  Location: [facility] ENDOSCOPY;  Service: Endoscopy;  Laterality: N/A;  . IR THORACENTESIS ASP PLEURAL SPACE W/IMG GUIDE  03/31/2018  . IR THORACENTESIS ASP PLEURAL SPACE W/IMG GUIDE  04/03/2018  . IR THORACENTESIS ASP PLEURAL SPACE W/IMG GUIDE  04/04/2018  . LEFT HEART CATH AND CORONARY ANGIOGRAPHY N/A 07/02/2016   Procedure: Left Heart Cath and Coronary  Angiography;  Surgeon: Martinique, Peter M, MD;  Location: [facility] LAB;  Service: Cardiovascular;  Laterality: N/A;  . MEATOTOMY N/A 10/04/2012   Procedure: MEATOTOMY ADULT;  Surgeon: Alexis Frock, MD;  Location: [facility];  Service: Urology;  Laterality: N/A;  . REMOVAL OF PLEURAL DRAINAGE CATHETER Left 05/24/2018   Procedure: REMOVAL OF PLEURAL DRAINAGE CATHETER;  Surgeon: Grace Isaac, MD;  Location: [facility];  Service: Thoracic;  Laterality: Left;  . TRANSURETHRAL RESECTION OF BLADDER TUMOR WITH GYRUS (TURBT-GYRUS) N/A 10/04/2012   Procedure: TRANSURETHRAL RESECTION OF BLADDER TUMOR WITH GYRUS (TURBT-GYRUS);  Surgeon: Alexis Frock, MD;  Location: [facility];  Service: Urology;  Laterality: N/A;  . VIDEO BRONCHOSCOPY Bilateral 07/19/2016   Procedure: VIDEO BRONCHOSCOPY WITH FLUORO;  Surgeon: Marshell Garfinkel, MD;  Location: [facility] ENDOSCOPY;  Service: Cardiopulmonary;  Laterality: Bilateral;   Family History:  Family History  Problem Relation Age of Onset  . Cancer Mother   . Heart disease Father        before age 48  . Heart attack Father   . Cancer Brother    Family Psychiatric  History: Denies  Social History:  Social History   Substance and Sexual Activity  Alcohol Use Yes  . Alcohol/week: 24.0 standard drinks  . Types: 10 Glasses of wine, 14 Cans of beer per week   Comment: few glases wine per day, beer     Social History   Substance and Sexual Activity  Drug Use No    Social History   Socioeconomic History  . Marital status: Married    Spouse name: Not on file  . Number of children: Not on file  . Years of education: Not on file  . Highest education level: Not on file  Occupational History  . Not on file  Tobacco Use  . Smoking status: Current Every Day Smoker    Packs/day: 0.50    Years: 50.00    Pack years: 25.00    Types: Cigarettes  . Smokeless tobacco: Never Used  . Tobacco comment: pt refuses info  Vaping Use  .  Vaping Use: Never used  Substance and Sexual Activity  . Alcohol use: Yes    Alcohol/week: 24.0 standard drinks    Types: 10 Glasses of wine, 14 Cans of beer per week    Comment: few glases wine per day, beer  . Drug use: No  . Sexual activity: Not on file  Other Topics Concern  . Not on file  Social History Narrative  . Not on file   Social Determinants of Health   Financial Resource Strain:   . Difficulty of Paying Living Expenses: Not on file  Food Insecurity:   . Worried About Charity fundraiser in the Last Year: Not on file  . Ran Out of Food in the Last Year: Not on file  Transportation Needs:   . Lack of Transportation (Medical): Not on file  . Lack of Transportation (Non-Medical): Not on file  Physical Activity:   . Days of Exercise per Week: Not on file  . Minutes of Exercise per Session: Not on file  Stress:   .  Feeling of Stress : Not on file  Social Connections:   . Frequency of Communication with Friends and Family: Not on file  . Frequency of Social Gatherings with Friends and Family: Not on file  . Attends Religious Services: Not on file  . Active Member of Clubs or Organizations: Not on file  . Attends Archivist Meetings: Not on file  . Marital Status: Not on file   Additional Social History:    Allergies:  No Known Allergies  Labs:  Results for orders placed or performed during the hospital encounter of 09/27/19 (from the past 48 hour(s))  SARS Coronavirus 2 by RT PCR (hospital order, performed in [facility] hospital lab) Nasopharyngeal Nasopharyngeal Swab     Status: None   Collection Time: 10/06/19  5:39 PM   Specimen: Nasopharyngeal Swab  Result Value Ref Range   SARS Coronavirus 2 NEGATIVE NEGATIVE    Comment: (NOTE) SARS-CoV-2 target nucleic acids are NOT DETECTED.  The SARS-CoV-2 RNA is generally detectable in upper and lower respiratory specimens during the acute phase of infection. The lowest concentration of SARS-CoV-2 viral  copies this assay can detect is 250 copies / mL. A negative result does not preclude SARS-CoV-2 infection and should not be used as the sole basis for treatment or other patient management decisions.  A negative result may occur with improper specimen collection / handling, submission of specimen other than nasopharyngeal swab, presence of viral mutation(s) within the areas targeted by this assay, and inadequate number of viral copies (<250 copies / mL). A negative result must be combined with clinical observations, patient history, and epidemiological information.  Fact Sheet for Patients:   StrictlyIdeas.no  Fact Sheet for Healthcare Providers: BankingDealers.co.za  This test is not yet approved or  cleared by the [place] FDA and has been authorized for detection and/or diagnosis of SARS-CoV-2 by FDA under an Emergency Use Authorization (EUA).  This EUA will remain in effect (meaning this test can be used) for the duration of the COVID-19 declaration under Section 564(b)(1) of the Act, 21 U.S.C. section 360bbb-3(b)(1), unless the authorization is terminated or revoked sooner.  Performed at [facility], [place] [address] [place],  [address]     Current Facility-Administered Medications  Medication Dose Route Frequency Provider Last Rate Last Admin  . acetaminophen (TYLENOL) tablet 650 mg  650 mg Oral Q6H PRN Karmen Bongo, MD   650 mg at 09/30/19 1328   Or  . acetaminophen (TYLENOL) suppository 650 mg  650 mg Rectal Q6H PRN Karmen Bongo, MD      . amLODipine (NORVASC) tablet 10 mg  10 mg Oral Daily Oretha Milch D, MD   10 mg at 10/07/19 0751  . bisacodyl (DULCOLAX) EC tablet 5 mg  5 mg Oral Daily PRN Karmen Bongo, MD   5 mg at 10/03/19 2120  . clonazePAM (KLONOPIN) tablet 1 mg  1 mg Oral QHS PRN Karmen Bongo, MD   1 mg at 10/06/19 2137  . docusate sodium (COLACE) capsule 100 mg  100 mg Oral BID Karmen Bongo, MD   100 mg at 10/07/19 0750  . enoxaparin (LOVENOX) injection 40 mg  40 mg Subcutaneous Daily Karmen Bongo, MD   40 mg at 10/07/19 0756  . folic acid (FOLVITE) tablet 1 mg  1 mg Oral Daily Karmen Bongo, MD   1 mg at 10/07/19 0751  . furosemide (LASIX) tablet 20 mg  20 mg Oral Daily Charlynne Cousins, MD  20 mg at 10/07/19 0750  . guaiFENesin (MUCINEX) 12 hr tablet 600 mg  600 mg Oral BID Oretha Milch D, MD   600 mg at 10/07/19 0751  . hydrALAZINE (APRESOLINE) injection 5 mg  5 mg Intravenous Q4H PRN Karmen Bongo, MD      . multivitamin with minerals tablet 1 tablet  1 tablet Oral Daily Karmen Bongo, MD   1 tablet at 10/07/19 0751  . nicotine (NICODERM CQ - dosed in mg/24 hours) patch 14 mg  14 mg Transdermal Daily Karmen Bongo, MD   14 mg at 10/07/19 0756  . ondansetron (ZOFRAN) tablet 4 mg  4 mg Oral Q6H PRN Karmen Bongo, MD       Or  . ondansetron [facility]) injection 4 mg  4 mg Intravenous Q6H PRN Karmen Bongo, MD      . oxyCODONE (Oxy IR/ROXICODONE) immediate release tablet 15 mg  15 mg Oral Q4H PRN Oretha Milch D, MD   15 mg at 10/07/19 1028  . pantoprazole (PROTONIX) EC tablet 40 mg  40 mg Oral BID Karmen Bongo, MD   40 mg at 10/07/19 0751  . polyethylene glycol (MIRALAX / GLYCOLAX) packet 17 g  17 g Oral Daily PRN Karmen Bongo, MD   17 g at 10/02/19 0115  . spironolactone (ALDACTONE) tablet 50 mg  50 mg Oral Daily Charlynne Cousins, MD   50 mg at 10/07/19 0750  . thiamine tablet 100 mg  100 mg Oral Daily Karmen Bongo, MD   100 mg at 10/07/19 2536   Or  . thiamine (B-1) injection 100 mg  100 mg Intravenous Daily Karmen Bongo, MD        Musculoskeletal: Strength & Muscle Tone: within normal limits Gait & Station: normal Patient leans: N/A  Psychiatric Specialty Exam: Physical Exam Psychiatric:        Attention and Perception: Attention normal.        Mood and Affect: Mood is depressed. Affect is flat.        Speech: Speech  normal.        Behavior: Behavior normal. Behavior is cooperative.        Thought Content: Thought content includes suicidal ideation.        Cognition and Memory: Cognition and memory normal.        Judgment: Judgment is impulsive.     Review of Systems  Blood pressure 140/72, pulse 78, temperature 98.7 F (37.1 C), temperature source Oral, resp. rate 18, height 5\' 10"  (1.778 m), weight 79.7 kg, SpO2 90 %.Body mass index is 25.21 kg/m.  General Appearance: Casual and wearing hospital gown  Eye Contact:  Good  Speech:  Clear and Coherent and Normal Rate  Volume:  Normal  Mood:  Depressed and Dysphoric  Affect:  Depressed  Thought Process:  Coherent, Goal Directed and Linear  Orientation:  Full (Time, Place, and Person)  Thought Content:  Logical  Suicidal Thoughts:  Yes.  without intent/plan  Homicidal Thoughts:  No  Memory:  Immediate;   Fair Recent;   Fair Remote;   Fair  Judgement:  Poor  Insight:  Shallow  Psychomotor Activity:  Psychomotor Retardation  Concentration:  Concentration: Fair and Attention Span: Fair  Recall:  [employer] of Knowledge:  Fair  Language:  Fair  Akathisia:  No  Handed:  Right  AIMS (if indicated):     Assets:  Agricultural consultant Leisure Time Social Support Transportation  ADL's:  Intact  Cognition:  WNL  Sleep:        Treatment Plan Summary:  74 year old male with history of Major depression, Anxiety and PTSD who was admitted after he intentionnaly attempted suicide by overdose. Today,  patient is alert, cooperative but still depressed and unable to contract for safety. He is at a very high risk for suicide based on his age, past history of depression, social isolation after the death of his wife, chronic medical problem, bereavement and financial issues. He will benefit from admission to inpatient Geriatric psychiatric facility after he is medically stabilized.  Recommendations: -Continue 1: 1 sitter for  safety. -Consider starting patient on Prozac 20mg  po daily for depression when he agrees(Currently refused anti-depressant).  - Patient will benefit from bereavement therapy or chaplain as his wife recently died.  -Consider social worker consult to facilitate Geriatric inpatient psychiatric admission. - Consider placing patient on IVC if he refuses Voluntary Geriatric inpatient admission.   Disposition: Recommend psychiatric Inpatient admission when medically cleared. Supportive therapy provided about ongoing stressors.  Corena Pilgrim, MD 10/07/2019 11:11 AM

## 2019-10-07 NOTE — Plan of Care (Signed)
  Problem: Activity: Goal: Risk for activity intolerance will decrease Outcome: Not Progressing   Problem: Coping: Goal: Level of anxiety will decrease Outcome: Not Progressing   Problem: Pain Managment: Goal: General experience of comfort will improve Outcome: Not Progressing

## 2019-10-07 NOTE — Discharge Summary (Signed)
Physician Discharge Summary  Tyler Mcmillan EPP:[id number] DOB: [birth year] DOA: 09/27/2019  PCP: Tyler Pretty, MD  Admit date: 09/27/2019 Discharge date: 10/07/2019  Admitted From: Home Disposition: Inpatient psychiatric facility   Recommendations for Outpatient Follow-up:  1. Follow up with PCP in 1-2 weeks 2. UOs were inpatient psychiatric facility and will go further evaluation.  Home Health:No Equipment/Devices:None  Discharge Condition:Stable CODE STATUS:Full Diet recommendation: Heart Healthy  Brief/Interim Summary:  74 y.o. 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 He completed his course of antibiotic treatment in house he was weaned to room air. He remained afebrile.  Bilateral lower extremity edema secondary to decompensated cirrhosis: He was diuresed we will continue Lasix and Aldactone as an outpatient electrolytes have remained stable.  Intentional opiate overdose with  suicide attempt: Was consulted and placed IVC, they recommended inpatient Geri psych admission.   Alcohol abuse: There were no signs of withdrawal he was started on thiamine and folate.  Chronic pain: His narcotics are followed by the pain clinic which was confirmed with a substance control database of [place]. No prescriptions were given.  Stage I sacral decubitus ulcer present on admission  Discharge Instructions  Discharge Instructions    Diet - low sodium heart healthy   Complete by: As directed    Increase activity slowly   Complete by: As directed      Allergies as of 10/07/2019   No Known Allergies     Medication List    STOP taking these medications   clonazePAM 1 MG tablet Commonly known as: KLONOPIN     TAKE these medications   amLODipine 10 MG tablet Commonly known as: NORVASC Take 1 tablet (10 mg total) by mouth daily.   docusate sodium 100 MG capsule Commonly known as: COLACE Take 1 capsule (100 mg total) by mouth 2 (two) times daily.   furosemide 20 MG tablet Commonly known as: LASIX Take 1 tablet (20 mg total) by mouth daily.   Oxycodone HCl 20 MG Tabs Take 20 mg by mouth every 6 (six) hours.   pantoprazole 40 MG tablet Commonly known as: PROTONIX Take 1 tablet (40 mg total) by mouth 2 (two) times daily.   spironolactone 50 MG tablet Commonly known as: ALDACTONE Take 1 tablet (50 mg total) by mouth daily. Start taking on: October 08, 2019            Durable Medical Equipment  (From admission, onward)         Start     Ordered   10/02/19 1153  For  home use only DME oxygen  Once       Question Answer Comment  Length of Need 6 Months   Mode or (Route) Nasal cannula   Frequency Continuous (stationary and portable oxygen unit needed)   Oxygen delivery system Gas      10/02/19 1152          No Known Allergies  Consultations:  Psyquiatry   Procedures/Studies: DG Chest 2 View  Result Date: 09/28/2019 CLINICAL  DATA:  Hypoxia EXAM: CHEST - 2 VIEW COMPARISON:  Chest radiograph 09/27/2019, CT chest 07/10/2018 FINDINGS: When accounting for differences in technique, likely similar loculated left pleural effusion. No discernible pneumothorax. New left basilar consolidation with silhouetting of the left hemidiaphragm. Similar chronic scarring and architectural distortion, most pronounced in the left upper lobe. Similar left lung volume loss with leftward shift of the mediastinum. Similar mild enlargement the cardiopericardial silhouette. Approximately 2.6 cm central cavitary mass in the left upper lobe, better characterized on prior CT chest from 07/10/2018 and consistent with known primary bronchogenic neoplasm. No acute osseous abnormality. Although suboptimally evaluated, grossly similar appearance of anterior wedging of multiple thoracic vertebral bodies and a chronic L1 compression fracture. IMPRESSION: 1. New left basilar consolidation, concerning for aspiration and/or pneumonia. 2. Loculated left pleural effusion, likely similar when accounting for differences in technique. 3. Redemonstrated left upper lobe mass, consistent with known primary bronchogenic neoplasm.CT chest could better assess for progression, if indicated. Electronically Signed   By: Margaretha Sheffield MD   On: 09/28/2019 08:34   CT CHEST W CONTRAST  Result Date: 09/28/2019 CLINICAL DATA:  74 year old male with history of non-small cell lung cancer and pleural effusion. EXAM: CT CHEST WITH CONTRAST TECHNIQUE: Multidetector CT imaging of the chest was performed during intravenous contrast administration. CONTRAST:  76mL OMNIPAQUE IOHEXOL 300 MG/ML  SOLN COMPARISON:  Chest CT dated 07/10/2018. FINDINGS: Cardiovascular: There is no cardiomegaly or pericardial effusion. There is advanced 3 vessel coronary vascular calcification. There is severe atherosclerotic calcification of the thoracic aorta. No aneurysmal dilatation or dissection. Evaluation of the  pulmonary arteries is limited due to suboptimal opacification of the peripheral branches. No central pulmonary artery embolus identified. Mediastinum/Nodes: No hilar adenopathy. Top-normal subcarinal lymph node measures 10 mm in short axis. The esophagus is grossly unremarkable. No mediastinal fluid collection. Lungs/Pleura: There is a 2.6 x 3.6 cm left upper lobe mass similar to prior CT in keeping with known malignancy. Areas of linear and reticular densities adjacent to the mass likely scarring and related to post treatment changes. A 2.1 x 1.4 cm nodular density along the left mediastinal pleural (49/3) likely scarring. Overall there is volume loss in the left upper lobe. There is background of emphysema. There are small bilateral pleural effusions. The right pleural effusion is new since the prior CT. There are bibasilar compressive atelectasis or pneumonia. There is no pneumothorax. The central airways are patent. Upper Abdomen: Cirrhosis. Small perihepatic free fluid. 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 Musculoskeletal: Osteopenia with degenerative changes of the spine. L1 compression fracture, seen previously. No acute osseous pathology. IMPRESSION: 1. No CT evidence of central pulmonary artery embolus. 2. No significant interval change in the size of left upper lobe mass/malignancy and surrounding post radiation changes/scarring. 3. Small bilateral pleural effusions. The right pleural effusion is new since the prior CT. 4. Bibasilar compressive atelectasis or pneumonia. 5. Cirrhosis with small perihepatic free fluid. 6. Aortic Atherosclerosis (ICD10-I70.0) and Emphysema (ICD10-J43.9). Electronically Signed  By: Anner Crete M.D.   On: 09/28/2019 22:30   DG CHEST PORT 1 VIEW  Result Date: 10/02/2019 CLINICAL DATA:  Cough.  Decreased breath sounds EXAM: PORTABLE CHEST 1 VIEW COMPARISON:  September 28, 2019 chest radiograph and chest CT  FINDINGS: Pleural effusions are noted bilaterally. There is consolidation in the left base, likely due to a combination of pneumonia and compressive atelectasis. A lesser degree of suspected compressive atelectasis is noted in the right base. The nodular area which appears masslike on CT in the left upper lobe is again noted, measuring approximately 2 x 1.5 cm. No new opacity is appreciable. The heart size and pulmonary vascularity are normal. No adenopathy is appreciable. There is aortic atherosclerosis. No bone lesions. IMPRESSION: Stool effusions bilaterally. Combination of probable pneumonia and atelectasis left base with compressive atelectasis right base. Somewhat nodular appearing area which appears masslike on CT in the left upper lobe is again noted. No new opacity is evident. Stable cardiac silhouette. Aortic Atherosclerosis (ICD10-I70.0). Electronically Signed   By: Lowella Grip III M.D.   On: 10/02/2019 13:57   DG Chest Port 1 View  Result Date: 09/27/2019 CLINICAL DATA:  Suicide attempt, took 63 oxycodone and drink a bottle of wine EXAM: PORTABLE CHEST 1 VIEW COMPARISON:  CT 07/10/2018 FINDINGS: Chronic left pleural effusion resulting in some gradient opacity of the left lung reflecting layering fluid. Chronic scarring and architectural distortion changes in the left upper lung as well as a more masslike opacity likely corresponding to a site of prior malignancy and or treatment related change. Background of chronically coarsened interstitial changes throughout both lungs. No right effusion. No visible pneumothorax. The aorta is calcified. The remaining cardiomediastinal contours are unremarkable. Stable mild cardiomegaly. No acute osseous or soft tissue abnormality. Degenerative changes are present in the imaged spine and shoulders. Telemetry leads overlie the chest. IMPRESSION: 1. Chronic left pleural effusion, not significantly changed from prior counting for differences in technique and  inflation. 2. Low volumes and atelectasis. 3. Chronic scarring and architectural distortion changes in the left upper lung 4. Masslike opacity likely corresponding to a site of prior malignancy and or treatment related change in the left upper lobe. 5. Additional chronic coarsened interstitial changes. 6. Stable cardiomegaly. 7.  Aortic Atherosclerosis (ICD10-I70.0). Electronically Signed   By: Lovena Le M.D.   On: 09/27/2019 04:40   ECHOCARDIOGRAM COMPLETE  Result Date: 09/28/2019    ECHOCARDIOGRAM REPORT   Patient Name:   AYVIN LIPINSKI Date of Exam: 09/28/2019 Medical Rec #:  [id number]             Height:       70.0 in Accession #:    [id number]            Weight:       198.6 lb Date of Birth:  [birth year]             BSA:          2.081 m Patient Age:    2 years              BP:           133/61 mmHg Patient Gender: M                     HR:           89 bpm. Exam Location:  Inpatient Procedure: 2D Echo, Cardiac Doppler, Color Doppler and Intracardiac  Opacification Agent Indications:    Peripheral edema  History:        Patient has prior history of Echocardiogram examinations, most                 recent 04/03/2018. Risk Factors:Hypertension and Current Smoker.                 Acute respiratory falure with hypoxia. ETOH abuse.  Sonographer:    Clayton Lefort RDCS (AE) Referring Phys: [id number] [place]  1. Left ventricular ejection fraction, by estimation, is 60 to 65%. The left ventricle has normal function. The left ventricle has no regional wall motion abnormalities. There is severe asymmetric left ventricular hypertrophy of the basal-septal and mild concentric LVH. Left ventricular diastolic parameters were normal.  2. Right ventricular systolic function is normal. The right ventricular size is normal. Tricuspid regurgitation signal is inadequate for assessing PA pressure.  3. The mitral valve is grossly normal. Trivial mitral valve regurgitation. No evidence of mitral  stenosis.  4. The aortic valve is abnormal. Aortic valve regurgitation is not visualized. Mild aortic valve sclerosis is present, with no evidence of aortic valve stenosis.  5. The inferior vena cava is dilated in size with <50% respiratory variability, suggesting right atrial pressure of 15 mmHg.  6. Aortic dilatation noted. There is borderline dilatation of the aortic root measuring 39 mm. FINDINGS  Left Ventricle: Left ventricular ejection fraction, by estimation, is 60 to 65%. The left ventricle has normal function. The left ventricle has no regional wall motion abnormalities. Definity contrast agent was given IV to delineate the left ventricular  endocardial borders. The left ventricular internal cavity size was normal in size. There is severe asymmetric left ventricular hypertrophy of the basal-septal and mild concentric LVH segments. Left ventricular diastolic parameters were normal. Right Ventricle: The right ventricular size is normal. No increase in right ventricular wall thickness. Right ventricular systolic function is normal. Tricuspid regurgitation signal is inadequate for assessing PA pressure. Left Atrium: Left atrial size was normal in size. Right Atrium: Right atrial size was normal in size. Pericardium: There is no evidence of pericardial effusion. Mitral Valve: The mitral valve is grossly normal. Normal mobility of the mitral valve leaflets. Trivial mitral valve regurgitation. No evidence of mitral valve stenosis. MV peak gradient, 6.6 mmHg. The mean mitral valve gradient is 3.0 mmHg with average heart rate of 88 bpm. Tricuspid Valve: The tricuspid valve is grossly normal. Tricuspid valve regurgitation is trivial. No evidence of tricuspid stenosis. Aortic Valve: The aortic valve is abnormal. Aortic valve regurgitation is not visualized. Mild aortic valve sclerosis is present, with no evidence of aortic valve stenosis. There is mild calcification of the aortic valve. Aortic valve mean gradient  measures 5.0 mmHg. Aortic valve peak gradient measures 8.1 mmHg. Aortic valve area, by VTI measures 3.50 cm. Pulmonic Valve: The pulmonic valve was not well visualized. Pulmonic valve regurgitation is not visualized. No evidence of pulmonic stenosis. Aorta: Aortic dilatation noted. There is borderline dilatation of the aortic root measuring 39 mm. Venous: The inferior vena cava is dilated in size with less than 50% respiratory variability, suggesting right atrial pressure of 15 mmHg. IAS/Shunts: No atrial level shunt detected by color flow Doppler.  LEFT VENTRICLE PLAX 2D LVIDd:         4.00 cm  Diastology LVIDs:         2.60 cm  LV e' lateral:   8.38 cm/s LV PW:  1.50 cm  LV E/e' lateral: 12.9 LV IVS:        2.10 cm  LV e' medial:    7.62 cm/s LVOT diam:     2.40 cm  LV E/e' medial:  14.2 LV SV:         105 LV SV Index:   51 LVOT Area:     4.52 cm  RIGHT VENTRICLE             IVC RV Basal diam:  4.70 cm     IVC diam: 2.40 cm RV Mid diam:    4.30 cm RV S prime:     12.60 cm/s TAPSE (M-mode): 2.0 cm LEFT ATRIUM              Index       RIGHT ATRIUM           Index LA diam:        4.00 cm  1.92 cm/m  RA Area:     22.20 cm LA Vol (A2C):   101.0 ml 48.53 ml/m RA Volume:   63.90 ml  30.70 ml/m LA Vol (A4C):   34.9 ml  16.77 ml/m LA Biplane Vol: 60.2 ml  28.93 ml/m  AORTIC VALVE AV Area (Vmax):    3.73 cm AV Area (Vmean):   3.67 cm AV Area (VTI):     3.50 cm AV Vmax:           142.00 cm/s AV Vmean:          107.000 cm/s AV VTI:            0.301 m AV Peak Grad:      8.1 mmHg AV Mean Grad:      5.0 mmHg LVOT Vmax:         117.00 cm/s LVOT Vmean:        86.800 cm/s LVOT VTI:          0.233 m LVOT/AV VTI ratio: 0.77  AORTA Ao Root diam: 3.90 cm Ao Asc diam:  3.80 cm MITRAL VALVE MV Area (PHT): 3.27 cm     SHUNTS MV Peak grad:  6.6 mmHg     Systemic VTI:  0.23 m MV Mean grad:  3.0 mmHg     Systemic Diam: 2.40 cm MV Vmax:       1.28 m/s MV Vmean:      79.1 cm/s MV Decel Time: 232 msec MV E velocity: 108.00  cm/s MV A velocity: 93.80 cm/s MV E/A ratio:  1.15 Cherlynn Kaiser MD Electronically signed by Cherlynn Kaiser MD Signature Date/Time: 09/28/2019/5:00:44 PM    Final    VAS [place] LOWER EXTREMITY VENOUS (DVT)  Result Date: 09/29/2019  Lower Venous DVTStudy Indications: Swelling, and Pain.  Risk Factors: Cancer History of lung cancer. Comparison Study: Prior study 11-17-2016 Bilateral LEV was WNL Performing Technologist: Darlin Coco  Examination Guidelines: A complete evaluation includes B-mode imaging, spectral Doppler, color Doppler, and power Doppler as needed of all accessible portions of each vessel. Bilateral testing is considered an integral part of a complete examination. Limited examinations for reoccurring indications may be performed as noted. The reflux portion of the exam is performed with the patient in reverse Trendelenburg.  +---------+---------------+---------+-----------+----------+--------------+ RIGHT    CompressibilityPhasicitySpontaneityPropertiesThrombus Aging +---------+---------------+---------+-----------+----------+--------------+ CFV      Full           Yes      Yes                                 +---------+---------------+---------+-----------+----------+--------------+  SFJ      Full                                                        +---------+---------------+---------+-----------+----------+--------------+ FV Prox  Full                                                        +---------+---------------+---------+-----------+----------+--------------+ FV Mid   Full                                                        +---------+---------------+---------+-----------+----------+--------------+ FV DistalFull                                                        +---------+---------------+---------+-----------+----------+--------------+ PFV      Full                                                         +---------+---------------+---------+-----------+----------+--------------+ POP      Full           Yes      Yes                                 +---------+---------------+---------+-----------+----------+--------------+ PTV      Full                                                        +---------+---------------+---------+-----------+----------+--------------+ PERO     Full                                                        +---------+---------------+---------+-----------+----------+--------------+   +---------+---------------+---------+-----------+----------+--------------+ LEFT     CompressibilityPhasicitySpontaneityPropertiesThrombus Aging +---------+---------------+---------+-----------+----------+--------------+ CFV      Full           Yes      Yes                                 +---------+---------------+---------+-----------+----------+--------------+ SFJ      Full                                                        +---------+---------------+---------+-----------+----------+--------------+  FV Prox  Full                                                        +---------+---------------+---------+-----------+----------+--------------+ FV Mid   Full                                                        +---------+---------------+---------+-----------+----------+--------------+ FV DistalFull                                                        +---------+---------------+---------+-----------+----------+--------------+ PFV      Full                                                        +---------+---------------+---------+-----------+----------+--------------+ POP      Full           Yes      Yes                                 +---------+---------------+---------+-----------+----------+--------------+ PTV      Full                                                         +---------+---------------+---------+-----------+----------+--------------+ PERO     Full                                                        +---------+---------------+---------+-----------+----------+--------------+     Summary: RIGHT: - There is no evidence of deep vein thrombosis in the lower extremity.  - No cystic structure found in the popliteal fossa.  LEFT: - There is no evidence of deep vein thrombosis in the lower extremity.  - No cystic structure found in the popliteal fossa.  *See table(s) above for measurements and observations. Electronically signed by Deitra Mayo MD on 09/29/2019 at 6:38:00 AM.    Final    US Abdomen Limited RUQ  Result Date: 09/30/2019 CLINICAL DATA:  74 year old male with cirrhosis and ascites. EXAM: ULTRASOUND ABDOMEN LIMITED RIGHT UPPER QUADRANT COMPARISON:  03/30/2018 CT and 07/31/2012 ultrasound FINDINGS: Gallbladder: Multiple small mobile gallstones are noted, the largest measuring 4 mm. There is no evidence of gallbladder wall thickening, pericholecystic fluid or sonographic Murphy sign. Common bile duct: Diameter: 4 mm.  No intrahepatic or extrahepatic biliary dilatation. Liver: Slight nodularity of the hepatic contour is noted, compatible with this patient's known cirrhosis. No focal hepatic abnormalities or masses are identified. Portal  vein is patent on color Doppler imaging with normal direction of blood flow towards the liver. Other: None. IMPRESSION: 1. Cholelithiasis without evidence of acute cholecystitis. No biliary dilatation. 2. Cirrhosis. No focal hepatic mass identified. Electronically Signed   By: Margarette Canada M.D.   On: 09/30/2019 17:04    (Echo, Carotid, EGD, Colonoscopy, ERCP)    Subjective:  No complaints. Discharge Exam: Vitals:   10/06/19 1243 10/06/19 1910  BP: 128/79 140/72  Pulse: 86 78  Resp: 18 18  Temp: 99.4 F (37.4 C) 98.7 F (37.1 C)  SpO2:  90%   Vitals:   10/06/19 0826 10/06/19 1243 10/06/19 1910  10/07/19 0556  BP: 124/63 128/79 140/72   Pulse: 74 86 78   Resp: 16 18 18    Temp:  99.4 F (37.4 C) 98.7 F (37.1 C)   TempSrc:  Oral Oral   SpO2: 96%  90%   Weight:    79.7 kg  Height:        General: Pt is alert, awake, not in acute distress Cardiovascular: RRR, S1/S2 +, no rubs, no gallops Respiratory: CTA bilaterally, no wheezing, no rhonchi Abdominal: Soft, NT, ND, bowel sounds + Extremities: no edema, no cyanosis    The results of significant diagnostics from this hospitalization (including imaging, microbiology, ancillary and laboratory) are listed below for reference.     Microbiology: Recent Results (from the past 240 hour(s))  SARS Coronavirus 2 by RT PCR (hospital order, performed in [facility] hospital lab) Nasopharyngeal Nasopharyngeal Swab     Status: None   Collection Time: 10/06/19  5:39 PM   Specimen: Nasopharyngeal Swab  Result Value Ref Range Status   SARS Coronavirus 2 NEGATIVE NEGATIVE Final    Comment: (NOTE) SARS-CoV-2 target nucleic acids are NOT DETECTED.  The SARS-CoV-2 RNA is generally detectable in upper and lower respiratory specimens during the acute phase of infection. The lowest concentration of SARS-CoV-2 viral copies this assay can detect is 250 copies / mL. A negative result does not preclude SARS-CoV-2 infection and should not be used as the sole basis for treatment or other patient management decisions.  A negative result may occur with improper specimen collection / handling, submission of specimen other than nasopharyngeal swab, presence of viral mutation(s) within the areas targeted by this assay, and inadequate number of viral copies (<250 copies / mL). A negative result must be combined with clinical observations, patient history, and epidemiological information.  Fact Sheet for Patients:   StrictlyIdeas.no  Fact Sheet for Healthcare Providers: BankingDealers.co.za  This test  is not yet approved or  cleared by the [place] FDA and has been authorized for detection and/or diagnosis of SARS-CoV-2 by FDA under an Emergency Use Authorization (EUA).  This EUA will remain in effect (meaning this test can be used) for the duration of the COVID-19 declaration under Section 564(b)(1) of the Act, 21 U.S.C. section 360bbb-3(b)(1), unless the authorization is terminated or revoked sooner.  Performed at [facility], [place] [address], [place], [place] [address]      Labs: BNP (last 3 results) Recent Labs    09/27/19 0403  BNP 616.0*   Basic Metabolic Panel: Recent Labs  Lab 10/01/19 0721 10/02/19 0526 10/02/19 0948 10/03/19 0608  NA 136 137  --  135  K 3.3* 3.3*  --  4.2  CL 98 97*  --  98  CO2 28 30  --  28  GLUCOSE 106* 109*  --  95  BUN 8 10  --  14  CREATININE 0.80 0.78  --  0.86  CALCIUM 8.5* 8.7*  --  8.8*  MG  --   --  1.7  --    Liver Function Tests: Recent Labs  Lab 10/01/19 0721 10/02/19 0526 10/03/19 0608  AST 26 30 44*  ALT 14 16 25   ALKPHOS 55 57 58  BILITOT 0.5 0.4 0.4  PROT 6.6 6.8 6.9  ALBUMIN 2.7* 2.8* 3.0*   No results for input(s): LIPASE, AMYLASE in the last 168 hours. No results for input(s): AMMONIA in the last 168 hours. CBC: Recent Labs  Lab 10/01/19 0721  WBC 6.3  NEUTROABS 4.8  HGB 9.7*  HCT 33.2*  MCV 82.4  PLT 251   Cardiac Enzymes: No results for input(s): CKTOTAL, CKMB, CKMBINDEX, TROPONINI in the last 168 hours. BNP: Invalid input(s): POCBNP CBG: No results for input(s): GLUCAP in the last 168 hours. D-Dimer No results for input(s): DDIMER in the last 72 hours. Hgb A1c No results for input(s): HGBA1C in the last 72 hours. Lipid Profile No results for input(s): CHOL, HDL, LDLCALC, TRIG, CHOLHDL, LDLDIRECT in the last 72 hours. Thyroid function studies No results for input(s): TSH, T4TOTAL, T3FREE, THYROIDAB in the last 72 hours.  Invalid input(s): FREET3 Anemia work up No results for  input(s): VITAMINB12, FOLATE, FERRITIN, TIBC, IRON, RETICCTPCT in the last 72 hours. Urinalysis    Component Value Date/Time   COLORURINE YELLOW 03/31/2018 0630   APPEARANCEUR CLEAR 03/31/2018 0630   LABSPEC 1.042 (H) 03/31/2018 0630   PHURINE 5.0 03/31/2018 0630   GLUCOSEU NEGATIVE 03/31/2018 0630   HGBUR SMALL (A) 03/31/2018 0630   BILIRUBINUR NEGATIVE 03/31/2018 0630   KETONESUR NEGATIVE 03/31/2018 0630   PROTEINUR NEGATIVE 03/31/2018 0630   NITRITE NEGATIVE 03/31/2018 0630   LEUKOCYTESUR NEGATIVE 03/31/2018 0630   Sepsis Labs Invalid input(s): PROCALCITONIN,  WBC,  LACTICIDVEN Microbiology Recent Results (from the past 240 hour(s))  SARS Coronavirus 2 by RT PCR (hospital order, performed in [place] hospital lab) Nasopharyngeal Nasopharyngeal Swab     Status: None   Collection Time: 10/06/19  5:39 PM   Specimen: Nasopharyngeal Swab  Result Value Ref Range Status   SARS Coronavirus 2 NEGATIVE NEGATIVE Final    Comment: (NOTE) SARS-CoV-2 target nucleic acids are NOT DETECTED.  The SARS-CoV-2 RNA is generally detectable in upper and lower respiratory specimens during the acute phase of infection. The lowest concentration of SARS-CoV-2 viral copies this assay can detect is 250 copies / mL. A negative result does not preclude SARS-CoV-2 infection and should not be used as the sole basis for treatment or other patient management decisions.  A negative result may occur with improper specimen collection / handling, submission of specimen other than nasopharyngeal swab, presence of viral mutation(s) within the areas targeted by this assay, and inadequate number of viral copies (<250 copies / mL). A negative result must be combined with clinical observations, patient history, and epidemiological information.  Fact Sheet for Patients:   StrictlyIdeas.no  Fact Sheet for Healthcare Providers: BankingDealers.co.za  This test is not  yet approved or  cleared by the [place] FDA and has been authorized for detection and/or diagnosis of SARS-CoV-2 by FDA under an Emergency Use Authorization (EUA).  This EUA will remain in effect (meaning this test can be used) for the duration of the COVID-19 declaration under Section 564(b)(1) of the Act, 21 U.S.C. section 360bbb-3(b)(1), unless the authorization is terminated or revoked sooner.  Performed at [facility], [place]  [address], [place], [place] [address]      Time coordinating discharge: Over 30 minutes  SIGNED:   Charlynne Cousins, MD  [facility] 10/07/2019, 7:57 AM Pager   If 7PM-7AM, please contact night-coverage [email or web address] Password [email or web address]

## 2019-10-07 NOTE — Progress Notes (Signed)
Psychiatry continues to recommend inpatient psych for this patient. CSW reached out to [facility] to determine if they can accept patient- facility did not have referral. Re faxed to [phone].   Kingsley Spittle, LCSW Transitions of Care Department

## 2019-10-08 NOTE — Progress Notes (Signed)
[facility] PROGRESS NOTE    Progress Note  Tyler Mcmillan  NFA:[id number] DOB: October 03, 1945 DOA: 09/27/2019 PCP: [name], MD     Brief Narrative:   Tyler Mcmillan is an 74 y.o. male past medical history significant for alcohol abuse, none small cell lung cancer status post directed radiotherapy essential hypertension AAA repair presents on 09/27/2019 with intentional overdose of oxycodone for suicidal ideation was found to be in acute hypoxic respiratory failure in the setting of aspiration pneumonia, will start on IV Zosyn, now switched to oral antibiotics, as his fluid overload he was started on IV Lasix.  Assessment/Plan:   Acute respiratory failure with hypoxia due to aspiration pneumonia, has completed his course of antibiotic. Try and wean him to room air. Continue to encourage incentive spirometry flutter valve. In the setting of intentional suicide attempt.  Intentional opiate overdose with suicide attempt: Psych was reconsulted and reevaluated him he is still high risk for suicidal attempts. He is IVC awaiting geriatric psych inpatient admission as recommended by psych.  Bilateral lower extremity edema secondary to decompensated cirrhosis: Now on oral Lasix and Aldactone creatinine has remained stable.  Diuresing well continue to follow strict I's and O's.  Non-small cell left lower lobe carcinoma diagnosed in 07/2016; Followed by oncology as an outpatient. CT surgery evaluated him that showed no evidence of progressive disease.  Alcohol abuse: Started on thiamine and folate continue to monitor with CIWA protocol.  Chronic pain: Takes oxycodone IR 20 mg every 6 hours, follows with pain management clinic, confirmed only provider for pain management on [place] controlled substance reporting database followed outpatient by pain management   Stage I sacral decubitus ulcer present on admission: RN Pressure Injury Documentation: Pressure  Injury 03/30/18 Stage I -  Intact skin with non-blanchable redness of a localized area usually over a bony prominence. (Active)  03/30/18 2000  Location: Sacrum  Location Orientation:   Staging: Stage I -  Intact skin with non-blanchable redness of a localized area usually over a bony prominence.  Wound Description (Comments):   Present on Admission: Yes    DVT prophylaxis: lovenox Family Communication:none Status is: Inpatient  Remains inpatient appropriate because:Hemodynamically unstable   Dispo: The patient is from: Home              Anticipated d/c is to: Geriatric psych inpatient facility              Anticipated d/c date is: 2 days              Patient currently is not medically stable to d/c.  Awaiting geriatric psych placement.     Code Status:     Code Status Orders  (From admission, onward)         Start     Ordered   09/27/19 1113  Full code  Continuous        09/27/19 1113        Code Status History    Date Active Date Inactive Code Status Order ID Comments User Context   09/27/2019 0911 09/27/2019 1113 Full Code [id number]  Davonna Belling, MD ED   04/03/2018 0854 04/10/2018 1716 Partial Code [id number]  Cristal Ford, DO Inpatient   03/30/2018 1955 04/03/2018 0854 Full Code [id number]  Cristal Ford, DO Inpatient   09/08/2016 1602 09/09/2016 1520 Full Code [id number]  Orbie Hurst Inpatient   07/02/2016 1456 07/03/2016 1654 Full Code [id number]  Elwin Mocha, MD ED   Advance Care Planning  Activity        IV Access:    Peripheral IV   Procedures and diagnostic studies:   No results found.   Medical Consultants:    None.  Anti-Infectives:   none  Subjective:    Tyler Mcmillan no complains.  Objective:    Vitals:   10/07/19 0556 10/07/19 1118 10/07/19 2019 10/08/19 0645  BP:  138/63 131/65 (!) 141/74  Pulse:  88 81 79  Resp:  18 18 18   Temp:  98.9 F (37.2 C) 98.5 F (36.9 C) 98.4 F (36.9 C)  TempSrc:  Oral  Oral Oral  SpO2:  91% 94% 93%  Weight: 79.7 kg   77.1 kg  Height:       SpO2: 93 % O2 Flow Rate (L/min): 1 L/min   Intake/Output Summary (Last 24 hours) at 10/08/2019 0836 Last data filed at 10/08/2019 0811 Gross per 24 hour  Intake 1200 ml  Output 350 ml  Net 850 ml   Filed Weights   10/06/19 0522 10/07/19 0556 10/08/19 0645  Weight: 79.2 kg 79.7 kg 77.1 kg    Exam: General exam: In no acute distress. Respiratory system: Good air movement and clear to auscultation. Cardiovascular system: S1 & S2 heard, RRR. No JVD. Gastrointestinal system: Abdomen is nondistended, soft and nontender.  Extremities: No pedal edema. Skin: No rashes, lesions or ulcers   Data Reviewed:    Labs: Basic Metabolic Panel: Recent Labs  Lab 10/02/19 0526 10/02/19 0948 10/03/19 0608  NA 137  --  135  K 3.3*  --  4.2  CL 97*  --  98  CO2 30  --  28  GLUCOSE 109*  --  95  BUN 10  --  14  CREATININE 0.78  --  0.86  CALCIUM 8.7*  --  8.8*  MG  --  1.7  --    GFR Estimated Creatinine Clearance: 77.8 mL/min (by C-G formula based on SCr of 0.86 mg/dL). Liver Function Tests: Recent Labs  Lab 10/02/19 0526 10/03/19 0608  AST 30 44*  ALT 16 25  ALKPHOS 57 58  BILITOT 0.4 0.4  PROT 6.8 6.9  ALBUMIN 2.8* 3.0*   No results for input(s): LIPASE, AMYLASE in the last 168 hours. No results for input(s): AMMONIA in the last 168 hours. Coagulation profile No results for input(s): INR, PROTIME in the last 168 hours. COVID-19 Labs  No results for input(s): DDIMER, FERRITIN, LDH, CRP in the last 72 hours.  Lab Results  Component Value Date   SARSCOV2NAA NEGATIVE 10/06/2019   [place] NEGATIVE 09/27/2019    CBC: No results for input(s): WBC, NEUTROABS, HGB, HCT, MCV, PLT in the last 168 hours. Cardiac Enzymes: No results for input(s): CKTOTAL, CKMB, CKMBINDEX, TROPONINI in the last 168 hours. BNP (last 3 results) No results for input(s): PROBNP in the last 8760 hours. CBG: No  results for input(s): GLUCAP in the last 168 hours. D-Dimer: No results for input(s): DDIMER in the last 72 hours. Hgb A1c: No results for input(s): HGBA1C in the last 72 hours. Lipid Profile: No results for input(s): CHOL, HDL, LDLCALC, TRIG, CHOLHDL, LDLDIRECT in the last 72 hours. Thyroid function studies: No results for input(s): TSH, T4TOTAL, T3FREE, THYROIDAB in the last 72 hours.  Invalid input(s): FREET3 Anemia work up: No results for input(s): VITAMINB12, FOLATE, FERRITIN, TIBC, IRON, RETICCTPCT in the last 72 hours. Sepsis Labs: No results for input(s): PROCALCITON, WBC, LATICACIDVEN in the last 168 hours. Microbiology Recent Results (from the  past 240 hour(s))  SARS Coronavirus 2 by RT PCR (hospital order, performed in [facility] hospital lab) Nasopharyngeal Nasopharyngeal Swab     Status: None   Collection Time: 10/06/19  5:39 PM   Specimen: Nasopharyngeal Swab  Result Value Ref Range Status   SARS Coronavirus 2 NEGATIVE NEGATIVE Final    Comment: (NOTE) SARS-CoV-2 target nucleic acids are NOT DETECTED.  The SARS-CoV-2 RNA is generally detectable in upper and lower respiratory specimens during the acute phase of infection. The lowest concentration of SARS-CoV-2 viral copies this assay can detect is 250 copies / mL. A negative result does not preclude SARS-CoV-2 infection and should not be used as the sole basis for treatment or other patient management decisions.  A negative result may occur with improper specimen collection / handling, submission of specimen other than nasopharyngeal swab, presence of viral mutation(s) within the areas targeted by this assay, and inadequate number of viral copies (<250 copies / mL). A negative result must be combined with clinical observations, patient history, and epidemiological information.  Fact Sheet for Patients:   StrictlyIdeas.no  Fact Sheet for Healthcare  Providers: BankingDealers.co.za  This test is not yet approved or  cleared by the [place] FDA and has been authorized for detection and/or diagnosis of SARS-CoV-2 by FDA under an Emergency Use Authorization (EUA).  This EUA will remain in effect (meaning this test can be used) for the duration of the COVID-19 declaration under Section 564(b)(1) of the Act, 21 U.S.C. section 360bbb-3(b)(1), unless the authorization is terminated or revoked sooner.  Performed at [facility], [place] [address] [place], [place] [address]      Medications:   . amLODipine  10 mg Oral Daily  . docusate sodium  100 mg Oral BID  . enoxaparin (LOVENOX) injection  40 mg Subcutaneous Daily  . folic acid  1 mg Oral Daily  . furosemide  20 mg Oral Daily  . guaiFENesin  600 mg Oral BID  . multivitamin with minerals  1 tablet Oral Daily  . nicotine  14 mg Transdermal Daily  . pantoprazole  40 mg Oral BID  . spironolactone  50 mg Oral Daily  . thiamine  100 mg Oral Daily   Or  . thiamine  100 mg Intravenous Daily   Continuous Infusions:    LOS: 10 days   Charlynne Cousins  [facility]  10/08/2019, 8:36 AM

## 2019-10-08 NOTE — Plan of Care (Signed)
  Problem: Health Behavior/Discharge Planning: Goal: Ability to manage health-related needs will improve Outcome: Progressing   Problem: Education: Goal: Knowledge of General Education information will improve Description: Including pain rating scale, medication(s)/side effects and non-pharmacologic comfort measures Outcome: Progressing

## 2019-10-08 NOTE — Progress Notes (Signed)
CSW called Tyler Mcmillan and verified that ref was received via fax. Spoke with Heath Lark who requested to have a callback from pt's nurse. CSW told nurse to call. CSW will follow up with Tyler Vinyard tomorrow.

## 2019-10-08 NOTE — Progress Notes (Signed)
OT Cancellation Note  Patient Details Name: Earley Grobe MRN: [id number] DOB: Nov 15, 1945   Cancelled Treatment:    Reason Eval/Treat Not Completed: Patient declined, no reason specified. Attempted OT session with pt eating breakfast and requested OT to check back in 2 hours. On second attempt, pt in bed and reported no desire to do therapy today. Will re-attempt session tomorrow if pt not discharged.   Layla Maw 10/08/2019, 9:51 AM

## 2019-10-08 NOTE — Progress Notes (Signed)
PT Cancellation Note  Patient Details Name: Tyler Mcmillan MRN: [id number] DOB: 12-10-1945   Cancelled Treatment:    Reason Eval/Treat Not Completed: Other (comment) ("I just cant do it today."  Pt declined politely.)   Denice Paradise 10/08/2019, 10:07 AM Anmarie Fukushima W,PT Acute Rehabilitation Services Pager:  [phone]  Office:  [phone]

## 2019-10-09 NOTE — Progress Notes (Addendum)
Patient's expresses concern about information that was told to the patient from a physician. Paged Neurology MD to clarify information - it was suppose to be relayed to another patient. Patient verbalizes anxiousness. Paged Venetia Constable, MD for anxiety medication and notified MD.

## 2019-10-09 NOTE — Progress Notes (Signed)
NT stated that she wasn't able to get O2 level above 88% on room air encourage patient to deep breath O2 stats increased to 94-95% on room air incentive spirometer was given and patient was told to use often set goal for 2250. Arthor Captain LPN

## 2019-10-09 NOTE — Progress Notes (Signed)
CSW spoke with [place] at [facility], she stated beds are available, faxed over ref for pt, waiting on response.

## 2019-10-09 NOTE — Discharge Summary (Signed)
Physician Discharge Summary  Tyler Mcmillan HEN:[id number] DOB: September 07, 1945 DOA: 09/27/2019  PCP: [name], MD  Admit date: 09/27/2019 Discharge date: 10/09/2019  Admitted From: Home Disposition: Inpatient psychiatric facility   Recommendations for Outpatient Follow-up:  1. Follow up with PCP in 1-2 weeks 2. UOs were inpatient psychiatric facility and will go further evaluation.  Home Health:No Equipment/Devices:None  Discharge Condition:Stable CODE STATUS:Full Diet recommendation: Heart Healthy  Brief/Interim Summary:  74 y.o. 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 He completed his course of antibiotic treatment in house he was weaned to room air. He remained afebrile.  Bilateral lower extremity edema secondary to decompensated cirrhosis: He was diuresed we will continue Lasix and Aldactone as an outpatient electrolytes have  remained stable.  Intentional opiate overdose with suicide attempt: Was consulted and placed IVC, they recommended inpatient Geri psych admission.   Alcohol abuse: There were no signs of withdrawal he was started on thiamine and folate.  Chronic pain: His narcotics are followed by the pain clinic which was confirmed with a substance control database of [place]. No prescriptions were given.  Stage I sacral decubitus ulcer present on admission  Discharge Instructions  Discharge Instructions    Diet - low sodium heart healthy   Complete by: As directed    Increase activity slowly   Complete by: As directed      Allergies as of 10/09/2019   No Known Allergies     Medication List    STOP taking these medications   clonazePAM 1 MG tablet Commonly known as: KLONOPIN     TAKE these medications   amLODipine 10 MG tablet Commonly known as: NORVASC Take 1 tablet (10 mg total) by mouth daily.   docusate sodium 100 MG capsule Commonly known as: COLACE Take 1 capsule (100 mg total) by mouth 2 (two) times daily.   furosemide 20 MG tablet Commonly known as: LASIX Take 1 tablet (20 mg total) by mouth daily.   Oxycodone HCl 20 MG Tabs Take 20 mg by mouth every 6 (six) hours.   pantoprazole 40 MG tablet Commonly known as: PROTONIX Take 1 tablet (40 mg total) by mouth 2 (two) times daily.   spironolactone 50 MG tablet Commonly known as: ALDACTONE Take 1 tablet (50 mg total) by mouth daily.            Durable Medical Equipment  (From admission, onward)         Start     Ordered   10/02/19 1153  For home use only DME oxygen  Once       Question Answer Comment  Length of Need 6 Months   Mode or (Route) Nasal cannula   Frequency Continuous (stationary and portable oxygen unit needed)   Oxygen delivery system Gas      10/02/19 1152          Follow-up Information    [name], MD.   Specialty: Internal Medicine Contact information: [address] [place] [address] [phone]              No Known Allergies  Consultations:  Psyquiatry   Procedures/Studies: DG Chest 2 View  Result Date: 09/28/2019 CLINICAL DATA:  Hypoxia EXAM: CHEST - 2 VIEW COMPARISON:  Chest radiograph 09/27/2019, CT chest 07/10/2018 FINDINGS: When accounting for differences in technique, likely similar loculated left pleural effusion. No discernible pneumothorax. New left basilar consolidation with silhouetting of the left hemidiaphragm. Similar chronic scarring and architectural distortion, most pronounced in the left upper lobe. Similar left lung volume loss with leftward shift of the mediastinum. Similar mild enlargement the cardiopericardial silhouette. Approximately 2.6 cm central cavitary mass in the left upper lobe, better characterized on prior CT chest from 07/10/2018 and consistent with known primary bronchogenic neoplasm. No acute osseous abnormality. Although suboptimally evaluated, grossly similar appearance of anterior wedging of multiple thoracic vertebral bodies and a chronic L1 compression fracture. IMPRESSION: 1. New left basilar consolidation, concerning for aspiration and/or pneumonia. 2. Loculated left pleural effusion, likely similar when accounting for differences in technique. 3. Redemonstrated left upper lobe mass, consistent with known primary bronchogenic neoplasm.CT chest could better assess for progression, if indicated. Electronically Signed   By: Margaretha Sheffield MD   On: 09/28/2019 08:34   CT CHEST W CONTRAST  Result Date: 09/28/2019 CLINICAL DATA:  74 year old male with history of non-small cell lung cancer and pleural effusion. EXAM: CT CHEST WITH CONTRAST TECHNIQUE: Multidetector CT imaging of the chest was performed during intravenous contrast administration. CONTRAST:  1mL OMNIPAQUE IOHEXOL 300 MG/ML  SOLN COMPARISON:  Chest CT dated 07/10/2018. FINDINGS: Cardiovascular: There is no cardiomegaly or  pericardial effusion. There is advanced 3 vessel coronary vascular calcification. There is severe atherosclerotic calcification of the thoracic aorta. No aneurysmal dilatation or dissection. Evaluation of the pulmonary arteries is limited due to suboptimal opacification of the peripheral branches. No central pulmonary artery embolus identified. Mediastinum/Nodes: No hilar adenopathy. Top-normal subcarinal lymph node measures 10 mm in short axis. The esophagus is grossly unremarkable. No mediastinal fluid collection. Lungs/Pleura: There is a 2.6 x 3.6 cm left upper lobe mass similar to prior CT in keeping with known malignancy. Areas of linear and reticular densities adjacent to the mass likely scarring and related to post treatment changes. A 2.1 x 1.4 cm nodular density along the left mediastinal pleural (49/3) likely scarring. Overall there is volume loss in the left upper lobe. There is background of emphysema. There are small bilateral pleural effusions. The right pleural effusion is new since the prior CT. There are bibasilar compressive atelectasis or pneumonia. There is no pneumothorax. The central airways are patent. Upper Abdomen: Cirrhosis. Small perihepatic free fluid. 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 Musculoskeletal: Osteopenia with degenerative changes of the spine. L1 compression fracture, seen previously. No acute osseous pathology. IMPRESSION: 1. No CT evidence of central pulmonary artery embolus. 2. No significant interval change in the size of left upper lobe mass/malignancy and surrounding post radiation changes/scarring. 3.  Small bilateral pleural effusions. The right pleural effusion is new since the prior CT. 4. Bibasilar compressive atelectasis or pneumonia. 5. Cirrhosis with small perihepatic free fluid. 6. Aortic Atherosclerosis (ICD10-I70.0) and Emphysema (ICD10-J43.9). Electronically Signed   By: Anner Crete M.D.    On: 09/28/2019 22:30   DG CHEST PORT 1 VIEW  Result Date: 10/02/2019 CLINICAL DATA:  Cough.  Decreased breath sounds EXAM: PORTABLE CHEST 1 VIEW COMPARISON:  September 28, 2019 chest radiograph and chest CT FINDINGS: Pleural effusions are noted bilaterally. There is consolidation in the left base, likely due to a combination of pneumonia and compressive atelectasis. A lesser degree of suspected compressive atelectasis is noted in the right base. The nodular area which appears masslike on CT in the left upper lobe is again noted, measuring approximately 2 x 1.5 cm. No new opacity is appreciable. The heart size and pulmonary vascularity are normal. No adenopathy is appreciable. There is aortic atherosclerosis. No bone lesions. IMPRESSION: Stool effusions bilaterally. Combination of probable pneumonia and atelectasis left base with compressive atelectasis right base. Somewhat nodular appearing area which appears masslike on CT in the left upper lobe is again noted. No new opacity is evident. Stable cardiac silhouette. Aortic Atherosclerosis (ICD10-I70.0). Electronically Signed   By: Lowella Grip III M.D.   On: 10/02/2019 13:57   DG Chest Port 1 View  Result Date: 09/27/2019 CLINICAL DATA:  Suicide attempt, took 7 oxycodone and drink a bottle of wine EXAM: PORTABLE CHEST 1 VIEW COMPARISON:  CT 07/10/2018 FINDINGS: Chronic left pleural effusion resulting in some gradient opacity of the left lung reflecting layering fluid. Chronic scarring and architectural distortion changes in the left upper lung as well as a more masslike opacity likely corresponding to a site of prior malignancy and or treatment related change. Background of chronically coarsened interstitial changes throughout both lungs. No right effusion. No visible pneumothorax. The aorta is calcified. The remaining cardiomediastinal contours are unremarkable. Stable mild cardiomegaly. No acute osseous or soft tissue abnormality. Degenerative changes  are present in the imaged spine and shoulders. Telemetry leads overlie the chest. IMPRESSION: 1. Chronic left pleural effusion, not significantly changed from prior counting for differences in technique and inflation. 2. Low volumes and atelectasis. 3. Chronic scarring and architectural distortion changes in the left upper lung 4. Masslike opacity likely corresponding to a site of prior malignancy and or treatment related change in the left upper lobe. 5. Additional chronic coarsened interstitial changes. 6. Stable cardiomegaly. 7.  Aortic Atherosclerosis (ICD10-I70.0). Electronically Signed   By: Lovena Le M.D.   On: 09/27/2019 04:40   ECHOCARDIOGRAM COMPLETE  Result Date: 09/28/2019    ECHOCARDIOGRAM REPORT   Patient Name:   ARKEEM HARTS Date of Exam: 09/28/2019 Medical Rec #:  [id number]             Height:       70.0 in Accession #:    [id number]            Weight:       198.6 lb Date of Birth:  [birth year]             BSA:          2.081 m Patient Age:    39 years              BP:           133/61 mmHg Patient Gender: M  HR:           89 bpm. Exam Location:  Inpatient Procedure: 2D Echo, Cardiac Doppler, Color Doppler and Intracardiac            Opacification Agent Indications:    Peripheral edema  History:        Patient has prior history of Echocardiogram examinations, most                 recent 04/03/2018. Risk Factors:Hypertension and Current Smoker.                 Acute respiratory falure with hypoxia. ETOH abuse.  Sonographer:    Clayton Lefort RDCS (AE) Referring Phys: [id number] [place]  1. Left ventricular ejection fraction, by estimation, is 60 to 65%. The left ventricle has normal function. The left ventricle has no regional wall motion abnormalities. There is severe asymmetric left ventricular hypertrophy of the basal-septal and mild concentric LVH. Left ventricular diastolic parameters were normal.  2. Right ventricular systolic function is normal. The  right ventricular size is normal. Tricuspid regurgitation signal is inadequate for assessing PA pressure.  3. The mitral valve is grossly normal. Trivial mitral valve regurgitation. No evidence of mitral stenosis.  4. The aortic valve is abnormal. Aortic valve regurgitation is not visualized. Mild aortic valve sclerosis is present, with no evidence of aortic valve stenosis.  5. The inferior vena cava is dilated in size with <50% respiratory variability, suggesting right atrial pressure of 15 mmHg.  6. Aortic dilatation noted. There is borderline dilatation of the aortic root measuring 39 mm. FINDINGS  Left Ventricle: Left ventricular ejection fraction, by estimation, is 60 to 65%. The left ventricle has normal function. The left ventricle has no regional wall motion abnormalities. Definity contrast agent was given IV to delineate the left ventricular  endocardial borders. The left ventricular internal cavity size was normal in size. There is severe asymmetric left ventricular hypertrophy of the basal-septal and mild concentric LVH segments. Left ventricular diastolic parameters were normal. Right Ventricle: The right ventricular size is normal. No increase in right ventricular wall thickness. Right ventricular systolic function is normal. Tricuspid regurgitation signal is inadequate for assessing PA pressure. Left Atrium: Left atrial size was normal in size. Right Atrium: Right atrial size was normal in size. Pericardium: There is no evidence of pericardial effusion. Mitral Valve: The mitral valve is grossly normal. Normal mobility of the mitral valve leaflets. Trivial mitral valve regurgitation. No evidence of mitral valve stenosis. MV peak gradient, 6.6 mmHg. The mean mitral valve gradient is 3.0 mmHg with average heart rate of 88 bpm. Tricuspid Valve: The tricuspid valve is grossly normal. Tricuspid valve regurgitation is trivial. No evidence of tricuspid stenosis. Aortic Valve: The aortic valve is abnormal.  Aortic valve regurgitation is not visualized. Mild aortic valve sclerosis is present, with no evidence of aortic valve stenosis. There is mild calcification of the aortic valve. Aortic valve mean gradient measures 5.0 mmHg. Aortic valve peak gradient measures 8.1 mmHg. Aortic valve area, by VTI measures 3.50 cm. Pulmonic Valve: The pulmonic valve was not well visualized. Pulmonic valve regurgitation is not visualized. No evidence of pulmonic stenosis. Aorta: Aortic dilatation noted. There is borderline dilatation of the aortic root measuring 39 mm. Venous: The inferior vena cava is dilated in size with less than 50% respiratory variability, suggesting right atrial pressure of 15 mmHg. IAS/Shunts: No atrial level shunt detected by color flow Doppler.  LEFT VENTRICLE PLAX 2D LVIDd:  4.00 cm  Diastology LVIDs:         2.60 cm  LV e' lateral:   8.38 cm/s LV PW:         1.50 cm  LV E/e' lateral: 12.9 LV IVS:        2.10 cm  LV e' medial:    7.62 cm/s LVOT diam:     2.40 cm  LV E/e' medial:  14.2 LV SV:         105 LV SV Index:   51 LVOT Area:     4.52 cm  RIGHT VENTRICLE             IVC RV Basal diam:  4.70 cm     IVC diam: 2.40 cm RV Mid diam:    4.30 cm RV S prime:     12.60 cm/s TAPSE (M-mode): 2.0 cm LEFT ATRIUM              Index       RIGHT ATRIUM           Index LA diam:        4.00 cm  1.92 cm/m  RA Area:     22.20 cm LA Vol (A2C):   101.0 ml 48.53 ml/m RA Volume:   63.90 ml  30.70 ml/m LA Vol (A4C):   34.9 ml  16.77 ml/m LA Biplane Vol: 60.2 ml  28.93 ml/m  AORTIC VALVE AV Area (Vmax):    3.73 cm AV Area (Vmean):   3.67 cm AV Area (VTI):     3.50 cm AV Vmax:           142.00 cm/s AV Vmean:          107.000 cm/s AV VTI:            0.301 m AV Peak Grad:      8.1 mmHg AV Mean Grad:      5.0 mmHg LVOT Vmax:         117.00 cm/s LVOT Vmean:        86.800 cm/s LVOT VTI:          0.233 m LVOT/AV VTI ratio: 0.77  AORTA Ao Root diam: 3.90 cm Ao Asc diam:  3.80 cm MITRAL VALVE MV Area (PHT): 3.27 cm      SHUNTS MV Peak grad:  6.6 mmHg     Systemic VTI:  0.23 m MV Mean grad:  3.0 mmHg     Systemic Diam: 2.40 cm MV Vmax:       1.28 m/s MV Vmean:      79.1 cm/s MV Decel Time: 232 msec MV E velocity: 108.00 cm/s MV A velocity: 93.80 cm/s MV E/A ratio:  1.15 Cherlynn Kaiser MD Electronically signed by Cherlynn Kaiser MD Signature Date/Time: 09/28/2019/5:00:44 PM    Final    VAS [place] LOWER EXTREMITY VENOUS (DVT)  Result Date: 09/29/2019  Lower Venous DVTStudy Indications: Swelling, and Pain.  Risk Factors: Cancer History of lung cancer. Comparison Study: Prior study 11-17-2016 Bilateral LEV was WNL Performing Technologist: Darlin Coco  Examination Guidelines: A complete evaluation includes B-mode imaging, spectral Doppler, color Doppler, and power Doppler as needed of all accessible portions of each vessel. Bilateral testing is considered an integral part of a complete examination. Limited examinations for reoccurring indications may be performed as noted. The reflux portion of the exam is performed with the patient in reverse Trendelenburg.  +---------+---------------+---------+-----------+----------+--------------+ RIGHT    CompressibilityPhasicitySpontaneityPropertiesThrombus Aging +---------+---------------+---------+-----------+----------+--------------+ CFV  Full           Yes      Yes                                 +---------+---------------+---------+-----------+----------+--------------+ SFJ      Full                                                        +---------+---------------+---------+-----------+----------+--------------+ FV Prox  Full                                                        +---------+---------------+---------+-----------+----------+--------------+ FV Mid   Full                                                        +---------+---------------+---------+-----------+----------+--------------+ FV DistalFull                                                         +---------+---------------+---------+-----------+----------+--------------+ PFV      Full                                                        +---------+---------------+---------+-----------+----------+--------------+ POP      Full           Yes      Yes                                 +---------+---------------+---------+-----------+----------+--------------+ PTV      Full                                                        +---------+---------------+---------+-----------+----------+--------------+ PERO     Full                                                        +---------+---------------+---------+-----------+----------+--------------+   +---------+---------------+---------+-----------+----------+--------------+ LEFT     CompressibilityPhasicitySpontaneityPropertiesThrombus Aging +---------+---------------+---------+-----------+----------+--------------+ CFV      Full           Yes      Yes                                 +---------+---------------+---------+-----------+----------+--------------+  SFJ      Full                                                        +---------+---------------+---------+-----------+----------+--------------+ FV Prox  Full                                                        +---------+---------------+---------+-----------+----------+--------------+ FV Mid   Full                                                        +---------+---------------+---------+-----------+----------+--------------+ FV DistalFull                                                        +---------+---------------+---------+-----------+----------+--------------+ PFV      Full                                                        +---------+---------------+---------+-----------+----------+--------------+ POP      Full           Yes      Yes                                  +---------+---------------+---------+-----------+----------+--------------+ PTV      Full                                                        +---------+---------------+---------+-----------+----------+--------------+ PERO     Full                                                        +---------+---------------+---------+-----------+----------+--------------+     Summary: RIGHT: - There is no evidence of deep vein thrombosis in the lower extremity.  - No cystic structure found in the popliteal fossa.  LEFT: - There is no evidence of deep vein thrombosis in the lower extremity.  - No cystic structure found in the popliteal fossa.  *See table(s) above for measurements and observations. Electronically signed by Deitra Mayo MD on 09/29/2019 at 6:38:00 AM.    Final    US Abdomen Limited RUQ  Result Date: 09/30/2019 CLINICAL DATA:  74 year old male with cirrhosis and ascites. EXAM: ULTRASOUND ABDOMEN LIMITED RIGHT UPPER QUADRANT COMPARISON:  03/30/2018 CT and 07/31/2012 ultrasound FINDINGS:  Gallbladder: Multiple small mobile gallstones are noted, the largest measuring 4 mm. There is no evidence of gallbladder wall thickening, pericholecystic fluid or sonographic Murphy sign. Common bile duct: Diameter: 4 mm.  No intrahepatic or extrahepatic biliary dilatation. Liver: Slight nodularity of the hepatic contour is noted, compatible with this patient's known cirrhosis. No focal hepatic abnormalities or masses are identified. Portal vein is patent on color Doppler imaging with normal direction of blood flow towards the liver. Other: None. IMPRESSION: 1. Cholelithiasis without evidence of acute cholecystitis. No biliary dilatation. 2. Cirrhosis. No focal hepatic mass identified. Electronically Signed   By: Margarette Canada M.D.   On: 09/30/2019 17:04   (Echo, Carotid, EGD, Colonoscopy, ERCP)    Subjective:  No complaints. Discharge Exam: Vitals:   10/09/19 0525 10/09/19 0847  BP: 129/67 129/65   Pulse: 77 85  Resp: 18 20  Temp: 97.8 F (36.6 C) 98.6 F (37 C)  SpO2: 95% 91%   Vitals:   10/08/19 2037 10/09/19 0325 10/09/19 0525 10/09/19 0847  BP: 132/71  129/67 129/65  Pulse: 80  77 85  Resp: 16  18 20   Temp: 98.7 F (37.1 C)  97.8 F (36.6 C) 98.6 F (37 C)  TempSrc: Oral  Oral Oral  SpO2: 91%  95% 91%  Weight:  78.8 kg    Height:        General: Pt is alert, awake, not in acute distress Cardiovascular: RRR, S1/S2 +, no rubs, no gallops Respiratory: CTA bilaterally, no wheezing, no rhonchi Abdominal: Soft, NT, ND, bowel sounds + Extremities: no edema, no cyanosis    The results of significant diagnostics from this hospitalization (including imaging, microbiology, ancillary and laboratory) are listed below for reference.     Microbiology: Recent Results (from the past 240 hour(s))  SARS Coronavirus 2 by RT PCR (hospital order, performed in [facility] hospital lab) Nasopharyngeal Nasopharyngeal Swab     Status: None   Collection Time: 10/06/19  5:39 PM   Specimen: Nasopharyngeal Swab  Result Value Ref Range Status   SARS Coronavirus 2 NEGATIVE NEGATIVE Final    Comment: (NOTE) SARS-CoV-2 target nucleic acids are NOT DETECTED.  The SARS-CoV-2 RNA is generally detectable in upper and lower respiratory specimens during the acute phase of infection. The lowest concentration of SARS-CoV-2 viral copies this assay can detect is 250 copies / mL. A negative result does not preclude SARS-CoV-2 infection and should not be used as the sole basis for treatment or other patient management decisions.  A negative result may occur with improper specimen collection / handling, submission of specimen other than nasopharyngeal swab, presence of viral mutation(s) within the areas targeted by this assay, and inadequate number of viral copies (<250 copies / mL). A negative result must be combined with clinical observations, patient history, and epidemiological  information.  Fact Sheet for Patients:   StrictlyIdeas.no  Fact Sheet for Healthcare Providers: BankingDealers.co.za  This test is not yet approved or  cleared by the [place] FDA and has been authorized for detection and/or diagnosis of SARS-CoV-2 by FDA under an Emergency Use Authorization (EUA).  This EUA will remain in effect (meaning this test can be used) for the duration of the COVID-19 declaration under Section 564(b)(1) of the Act, 21 U.S.C. section 360bbb-3(b)(1), unless the authorization is terminated or revoked sooner.  Performed at [facility], [place] [address] [place], [place] [address]      Labs: BNP (last 3 results) Recent Labs    09/27/19  0403  BNP 073.7*   Basic Metabolic Panel: Recent Labs  Lab 10/02/19 0948 10/03/19 0608  NA  --  135  K  --  4.2  CL  --  98  CO2  --  28  GLUCOSE  --  95  BUN  --  14  CREATININE  --  0.86  CALCIUM  --  8.8*  MG 1.7  --    Liver Function Tests: Recent Labs  Lab 10/03/19 0608  AST 44*  ALT 25  ALKPHOS 58  BILITOT 0.4  PROT 6.9  ALBUMIN 3.0*   No results for input(s): LIPASE, AMYLASE in the last 168 hours. No results for input(s): AMMONIA in the last 168 hours. CBC: No results for input(s): WBC, NEUTROABS, HGB, HCT, MCV, PLT in the last 168 hours. Cardiac Enzymes: No results for input(s): CKTOTAL, CKMB, CKMBINDEX, TROPONINI in the last 168 hours. BNP: Invalid input(s): POCBNP CBG: No results for input(s): GLUCAP in the last 168 hours. D-Dimer No results for input(s): DDIMER in the last 72 hours. Hgb A1c No results for input(s): HGBA1C in the last 72 hours. Lipid Profile No results for input(s): CHOL, HDL, LDLCALC, TRIG, CHOLHDL, LDLDIRECT in the last 72 hours. Thyroid function studies No results for input(s): TSH, T4TOTAL, T3FREE, THYROIDAB in the last 72 hours.  Invalid input(s): FREET3 Anemia work up No results for input(s):  VITAMINB12, FOLATE, FERRITIN, TIBC, IRON, RETICCTPCT in the last 72 hours. Urinalysis    Component Value Date/Time   COLORURINE YELLOW 03/31/2018 0630   APPEARANCEUR CLEAR 03/31/2018 0630   LABSPEC 1.042 (H) 03/31/2018 0630   PHURINE 5.0 03/31/2018 0630   GLUCOSEU NEGATIVE 03/31/2018 0630   HGBUR SMALL (A) 03/31/2018 0630   BILIRUBINUR NEGATIVE 03/31/2018 0630   KETONESUR NEGATIVE 03/31/2018 0630   PROTEINUR NEGATIVE 03/31/2018 0630   NITRITE NEGATIVE 03/31/2018 0630   LEUKOCYTESUR NEGATIVE 03/31/2018 0630   Sepsis Labs Invalid input(s): PROCALCITONIN,  WBC,  LACTICIDVEN Microbiology Recent Results (from the past 240 hour(s))  SARS Coronavirus 2 by RT PCR (hospital order, performed in [place] hospital lab) Nasopharyngeal Nasopharyngeal Swab     Status: None   Collection Time: 10/06/19  5:39 PM   Specimen: Nasopharyngeal Swab  Result Value Ref Range Status   SARS Coronavirus 2 NEGATIVE NEGATIVE Final    Comment: (NOTE) SARS-CoV-2 target nucleic acids are NOT DETECTED.  The SARS-CoV-2 RNA is generally detectable in upper and lower respiratory specimens during the acute phase of infection. The lowest concentration of SARS-CoV-2 viral copies this assay can detect is 250 copies / mL. A negative result does not preclude SARS-CoV-2 infection and should not be used as the sole basis for treatment or other patient management decisions.  A negative result may occur with improper specimen collection / handling, submission of specimen other than nasopharyngeal swab, presence of viral mutation(s) within the areas targeted by this assay, and inadequate number of viral copies (<250 copies / mL). A negative result must be combined with clinical observations, patient history, and epidemiological information.  Fact Sheet for Patients:   StrictlyIdeas.no  Fact Sheet for Healthcare Providers: BankingDealers.co.za  This test is not yet  approved or  cleared by the [place] FDA and has been authorized for detection and/or diagnosis of SARS-CoV-2 by FDA under an Emergency Use Authorization (EUA).  This EUA will remain in effect (meaning this test can be used) for the duration of the COVID-19 declaration under Section 564(b)(1) of the Act, 21 U.S.C. section 360bbb-3(b)(1), unless the authorization  is terminated or revoked sooner.  Performed at [facility], [place] [address], [place], [place] [address]      Time coordinating discharge: Over 30 minutes  SIGNED:   Charlynne Cousins, MD  [facility] 10/09/2019, 9:14 AM Pager   If 7PM-7AM, please contact night-coverage [email or web address] Password [email or web address]

## 2019-10-09 NOTE — Progress Notes (Signed)
PT Cancellation Note  Patient Details Name: Tyler Mcmillan MRN: [id number] DOB: Jun 13, 1945   Cancelled Treatment:    Reason Eval/Treat Not Completed: Other (comment) (declined due to just worked with [name]. )   Denice Paradise 10/09/2019, 11:36 AM Meiko Ives W,PT [place] Pager:  [phone]  Office:  [phone]

## 2019-10-09 NOTE — Progress Notes (Signed)
Occupational Therapy Treatment Patient Details Name: Lavonte Palos MRN: [id number] DOB: [birth year] Today's Date: 10/09/2019    History of present illness Pt is a 74 y.o. male with PMH significant of SCC of left lung (2018); HTN; anxiety d/o; bladder cancer; and AAA presenting with intentional overdose. Admitted for hypoxic respiratory failure could be from edema versus aspiration. Of note: Wife died 08-07-22, and was his caregiver and helper.    OT comments  Patient continues to make minimal progress towards goals in skilled OT session. Patient's session encompassed limited session due to pt's affect, and not wanting to participate due to lack of sleep the night previous. Pt willing to work with therapist upon second attempt to see pt, however pt participated minimally by ambulating to sink, completing basic ADLs, and electing to sit EOB instead of chair. Discharge remains appropriate; will continue to follow acutely.    Follow Up Recommendations  Supervision/Assistance - 24 hour;Home health OT    Equipment Recommendations  Other (comment) (Hip Kit)    Recommendations for Other Services Other (comment) (Geriatric Inpatient Psych)    Precautions / Restrictions Precautions Precautions: Fall;Other (comment) (Suicide) Restrictions Weight Bearing Restrictions: No       Mobility Bed Mobility Overal bed mobility: Needs Assistance       Supine to sit: Min guard     General bed mobility comments: extra time required, use of bed rail  Transfers Overall transfer level: Needs assistance Equipment used: Rolling walker (2 wheeled) Transfers: Sit to/from Stand Sit to Stand: Min guard         General transfer comment: min G for safety, minimally impulsive in session (moreso likely to get the therapist out of the room and leave him alone) would adhere to cues for safety    Balance Overall balance assessment: Needs assistance Sitting-balance support: Feet supported;No upper  extremity supported Sitting balance-Leahy Scale: Fair Sitting balance - Comments: EOB without back support   Standing balance support: Bilateral upper extremity supported;During functional activity;No upper extremity supported Standing balance-Leahy Scale: Fair Standing balance comment: Able to ambulate back to bed without support, but 110f distance                           ADL either performed or assessed with clinical judgement   ADL Overall ADL's : Needs assistance/impaired     Grooming: Set up;Wash/dry face;Oral care;Brushing hair;Wash/dry hands;Standing Grooming Details (indicate cue type and reason): Standing at sink                             Functional mobility during ADLs: Min guard;Rolling walker General ADL Comments: Continues to have pain in BLEs, self limiting in session, but willing to participate after 2nd attempt this date     Vision       Perception     Praxis      Cognition Arousal/Alertness: Awake/alert Behavior During Therapy: Flat affect Overall Cognitive Status: Within Functional Limits for tasks assessed                                          Exercises     Shoulder Instructions       General Comments      Pertinent Vitals/ Pain       Pain Assessment: Faces Faces Pain Scale: Hurts  little more Pain Location: Bil LE Pain Descriptors / Indicators: Discomfort;Tender;Tightness Pain Intervention(s): Limited activity within patient's tolerance;Monitored during session;Repositioned  Home Living                                          Prior Functioning/Environment              Frequency  Min 2X/week        Progress Toward Goals  OT Goals(current goals can now be found in the care plan section)  Progress towards OT goals: Progressing toward goals  Acute Rehab OT Goals Patient Stated Goal: did not state OT Goal Formulation: Patient unable to participate in goal  setting Time For Goal Achievement: 10/12/19 Potential to Achieve Goals: Fair ADL Goals Pt Will Perform Grooming: with supervision;sitting Pt Will Perform Lower Body Dressing: with supervision;with adaptive equipment;sit to/from stand Pt Will Transfer to Toilet: with min guard assist;ambulating;bedside commode Additional ADL Goal #1: Pt will complete bed mobility supervision level as precursor to adls. Additional ADL Goal #2: pt will demonstrate line management with transfers supervision level  Plan Discharge plan remains appropriate    Co-evaluation                 AM-PAC OT "6 Clicks" Daily Activity     Outcome Measure   Help from another person eating meals?: None Help from another person taking care of personal grooming?: A Little Help from another person toileting, which includes using toliet, bedpan, or urinal?: A Little Help from another person bathing (including washing, rinsing, drying)?: A Lot Help from another person to put on and taking off regular upper body clothing?: A Little Help from another person to put on and taking off regular lower body clothing?: A Lot 6 Click Score: 17    End of Session Equipment Utilized During Treatment: Rolling walker  OT Visit Diagnosis: Unsteadiness on feet (R26.81);Muscle weakness (generalized) (M62.81)   Activity Tolerance Patient limited by lethargy   Patient Left in bed;with call bell/phone within reach;with nursing/sitter in room (Electing to sit EOB)   Nurse Communication Mobility status;Precautions        Time: 9326-7124 OT Time Calculation (min): 8 min  Charges: OT General Charges $OT Visit: 1 Visit OT Treatments $Self Care/Home Management : 8-22 mins  Corinne Ports E. Cyrene Gharibian, COTA/L Acute Rehabilitation Services [place] 10/09/2019, 11:23 AM

## 2019-10-10 MED ORDER — DICLOFENAC SODIUM 1 % EX GEL
2.0000 g | Freq: Four times a day (QID) | CUTANEOUS | Status: DC
  Administered 2019-10-10: 14:00:00 2 g via TOPICAL
  Filled 2019-10-10: qty 100

## 2019-10-10 NOTE — Progress Notes (Signed)
TRH Progress note  Patient discharged yesterday. Awaiting bed. I have reviewed the chart and examined him.  Subjective: He has soreness in his left chest wall  Exam:  Vitals with BMI 10/10/2019 10/10/2019 10/10/2019  Height - - -  Weight - - 174 lbs 10 oz  BMI - - 73.22  Systolic 025 427 062  Diastolic 67 65 78  Pulse 89 86 85   General exam: Appears comfortable  HEENT: PERRLA, oral mucosa moist, no sclera icterus or thrush Respiratory system: Clear to auscultation. Respiratory effort normal. Cardiovascular system: S1 & S2 heard,  No murmurs  Gastrointestinal system: Abdomen soft, non-tender, nondistended. Normal bowel sounds   Central nervous system: Alert and oriented. No focal neurological deficits. Extremities: No cyanosis, clubbing or edema Skin: No rashes or ulcers Psychiatry:  Mood & affect appropriate.    Add Voltaren gel today. Cont Oxycodone. Cont all other treatments. Will re-evaulate tomorrow.   Debbe Odea, MD

## 2019-10-10 NOTE — Progress Notes (Signed)
Physical Therapy Treatment Patient Details Name: Tyler Mcmillan MRN: [id number] DOB: March 26, 1945 Today's Date: 74/02/2019    History of Present Illness Pt is a 74 y.o. male with PMH significant of SCC of left lung (2018); HTN; anxiety d/o; bladder cancer; and AAA presenting with intentional overdose. Admitted for hypoxic respiratory failure could be from edema versus aspiration. Of note: Wife died 2022/08/18, and was his caregiver and helper.     PT Comments    Pt admitted with above diagnosis. Pt was able to ambulate with min guard assist and cues with RW.  Pt did not need O2 today. Pt continues to have depressed mood but was agreeable to walk in hallway today but limits interaction for the most part.  Did some exercises as well.  Pt currently with functional limitations due to balance and endurance deficits. Pt will benefit from skilled PT to increase their independence and safety with mobility to allow discharge to the venue listed below.     Follow Up Recommendations  Home health PT (Behavioral health is the plan)     Equipment Recommendations  None recommended by PT    Recommendations for Other Services       Precautions / Restrictions Precautions Precautions: Fall;Other (comment) (Suicide) Restrictions Weight Bearing Restrictions: No    Mobility  Bed Mobility Overal bed mobility: Needs Assistance Bed Mobility: Supine to Sit     Supine to sit: Min guard     General bed mobility comments: extra time required, use of bed rail  Transfers Overall transfer level: Needs assistance Equipment used: Rolling walker (2 wheeled) Transfers: Sit to/from Stand Sit to Stand: Min guard Stand pivot transfers: Min guard;Min assist       General transfer comment: min G for safety, adheres to cues for safety  Ambulation/Gait Ambulation/Gait assistance: Min guard Gait Distance (Feet): 350 Feet Assistive device: Rolling walker (2 wheeled) Gait Pattern/deviations: Step-through  pattern;Decreased stride length;Trunk flexed;Wide base of support   Gait velocity interpretation: <1.31 ft/sec, indicative of household ambulator General Gait Details: Pt was able to ambulate with RW wtih min guard assist progressing to hallway.  O2 sats >90% on RA with activity.    Stairs             Wheelchair Mobility    Modified Rankin (Stroke Patients Only)       Balance Overall balance assessment: Needs assistance Sitting-balance support: Feet supported;No upper extremity supported Sitting balance-Leahy Scale: Fair Sitting balance - Comments: EOB without back support   Standing balance support: Bilateral upper extremity supported;During functional activity;No upper extremity supported Standing balance-Leahy Scale: Fair Standing balance comment: Can stand statically without UE support                             Cognition Arousal/Alertness: Awake/alert Behavior During Therapy: Flat affect Overall Cognitive Status: Within Functional Limits for tasks assessed                                 General Comments: no mentions of suicidal ideation during session      Exercises General Exercises - Lower Extremity Ankle Circles/Pumps: AROM;Both;10 reps;Seated Long Arc Quad: AROM;Both;10 reps;Seated Hip Flexion/Marching: AROM;Both;10 reps;Standing Mini-Sqauts: AROM;Both;10 reps;Standing    General Comments        Pertinent Vitals/Pain Pain Assessment: Faces Faces Pain Scale: Hurts little more Pain Location: Bil LE and back Pain Descriptors /  Indicators: Discomfort;Tender;Tightness Pain Intervention(s): Limited activity within patient's tolerance;Monitored during session;Repositioned    Home Living                      Prior Function            PT Goals (current goals can now be found in the care plan section) Acute Rehab PT Goals Patient Stated Goal: did not state Progress towards PT goals: Progressing toward goals     Frequency    Min 3X/week      PT Plan Current plan remains appropriate    Co-evaluation              AM-PAC PT "6 Clicks" Mobility   Outcome Measure  Help needed turning from your back to your side while in a flat bed without using bedrails?: A Little Help needed moving from lying on your back to sitting on the side of a flat bed without using bedrails?: A Little Help needed moving to and from a bed to a chair (including a wheelchair)?: A Little Help needed standing up from a chair using your arms (e.g., wheelchair or bedside chair)?: A Little Help needed to walk in hospital room?: A Little Help needed climbing 3-5 steps with a railing? : A Little 6 Click Score: 18    End of Session Equipment Utilized During Treatment: Gait belt Activity Tolerance: Patient limited by fatigue Patient left: in bed;with call bell/phone within reach;with nursing/sitter in room (on EOB) Nurse Communication: Mobility status PT Visit Diagnosis: Muscle weakness (generalized) (M62.81)     Time: 1040-1051 PT Time Calculation (min) (ACUTE ONLY): 11 min  Charges:  $Gait Training: 8-22 mins                     Yomayra Tate W,PT Acute Rehabilitation Services Pager:  [phone]  Office:  [place] 10/10/2019, 1:31 PM

## 2019-10-10 NOTE — Plan of Care (Signed)
  Problem: Clinical Measurements: Goal: Ability to maintain clinical measurements within normal limits will improve Outcome: Progressing Goal: Will remain free from infection Outcome: Progressing Goal: Diagnostic test results will improve Outcome: Progressing Goal: Respiratory complications will improve Outcome: Progressing Goal: Cardiovascular complication will be avoided Outcome: Progressing   Problem: Activity: Goal: Risk for activity intolerance will decrease Outcome: Progressing   Problem: Coping: Goal: Level of anxiety will decrease Outcome: Progressing   Problem: Pain Managment: Goal: General experience of comfort will improve Outcome: Progressing   Problem: Safety: Goal: Ability to remain free from injury will improve Outcome: Progressing

## 2019-10-10 NOTE — Plan of Care (Signed)
  Problem: Clinical Measurements: Goal: Ability to maintain clinical measurements within normal limits will improve Outcome: Progressing Goal: Will remain free from infection Outcome: Progressing   

## 2019-10-11 NOTE — Progress Notes (Addendum)
Occupational Therapy Treatment Patient Details Name: Tyler Mcmillan MRN: [id number] DOB: 13-Jul-1945 Today's Date: 10/11/2019    History of present illness Pt is a 74 y.o. male with PMH significant of SCC of left lung (2018); HTN; anxiety d/o; bladder cancer; and AAA presenting with intentional overdose. Admitted for hypoxic respiratory failure could be from edema versus aspiration. Of note: Wife died 08/14/2022, and was his caregiver and helper.    OT comments  Pt progressing towards acute OT goals. Some OT goals met and updated today. On second attempt to see pt, he was reluctantly agreeable to OOB activity "I know you're not going to stop until I do this." Pt walked household distance this session, out and into the hallway a bit. VSS. Pt apologized on the way back to his room "I'm sorry I was grumpy." Offered recliner vs bed. Pt elected back to bed at end of session. "I'll get up there (recliner) later." D/c plan remains appropriate.    Follow Up Recommendations  Supervision/Assistance - 24 hour;Home health OT ; (Behavioral health is the plan)   Equipment Recommendations  3 in 1 bedside commode;Tub/shower bench;Other (comment) (AE for LB ADL)    Recommendations for Other Services     Precautions / Restrictions Precautions Precautions: Fall;Other (comment) (Suicide) Restrictions Weight Bearing Restrictions: No       Mobility Bed Mobility Overal bed mobility: Needs Assistance Bed Mobility: Supine to Sit;Sit to Supine     Supine to sit: Min guard Sit to supine: Min guard   General bed mobility comments: extra time required, use of bed rail  Transfers Overall transfer level: Needs assistance Equipment used: Rolling walker (2 wheeled) Transfers: Sit to/from Stand Sit to Stand: Min guard         General transfer comment: min guard for safety    Balance Overall balance assessment: Needs assistance Sitting-balance support: Feet supported;No upper extremity  supported Sitting balance-Leahy Scale: Fair Sitting balance - Comments: EOB without back support   Standing balance support: Bilateral upper extremity supported;During functional activity;No upper extremity supported Standing balance-Leahy Scale: Fair                             ADL either performed or assessed with clinical judgement   ADL Overall ADL's : Needs assistance/impaired                         Toilet Transfer: RW;Min Psychiatric nurse Details (indicate cue type and reason): light steadying assist to regular height toilet         Functional mobility during ADLs: Min guard;Rolling walker General ADL Comments: Pt agreeable to walking in the hall. Walked household distance at min guard level. VSS.      Vision       Perception     Praxis      Cognition Arousal/Alertness: Awake/alert Behavior During Therapy: Flat affect Overall Cognitive Status: Within Functional Limits for tasks assessed                                          Exercises     Shoulder Instructions       General Comments vss    Pertinent Vitals/ Pain       Pain Assessment: Faces Faces Pain Scale: Hurts little more Pain Location: Bil LE and  back Pain Descriptors / Indicators: Discomfort Pain Intervention(s): Monitored during session;Limited activity within patient's tolerance;Repositioned  Home Living                                          Prior Functioning/Environment              Frequency  Min 2X/week        Progress Toward Goals  OT Goals(current goals can now be found in the care plan section)  Progress towards OT goals: Progressing toward goals;Goals met and updated - see care plan  Acute Rehab OT Goals Patient Stated Goal: did not state OT Goal Formulation: Patient unable to participate in goal setting Time For Goal Achievement: 10/25/19 Potential to Achieve Goals: Fair ADL Goals Pt Will Perform  Grooming: with supervision;sitting Pt Will Perform Lower Body Dressing: with supervision;with adaptive equipment;sit to/from stand Pt Will Transfer to Toilet: with supervision;ambulating Pt/caregiver will Perform Home Exercise Program: Increased strength;Both right and left upper extremity;With written HEP provided;Independently Additional ADL Goal #1: Pt will complete bed mobility supervision level as precursor to adls. Additional ADL Goal #2: n/a no more lines  Plan Discharge plan remains appropriate    Co-evaluation                 AM-PAC OT "6 Clicks" Daily Activity     Outcome Measure   Help from another person eating meals?: None Help from another person taking care of personal grooming?: A Little Help from another person toileting, which includes using toliet, bedpan, or urinal?: A Little Help from another person bathing (including washing, rinsing, drying)?: A Lot Help from another person to put on and taking off regular upper body clothing?: A Little Help from another person to put on and taking off regular lower body clothing?: A Lot 6 Click Score: 17    End of Session Equipment Utilized During Treatment: Rolling walker  OT Visit Diagnosis: Unsteadiness on feet (R26.81);Muscle weakness (generalized) (M62.81)   Activity Tolerance Patient tolerated treatment well   Patient Left in bed;with call bell/phone within reach;with nursing/sitter in room   Nurse Communication          Time: 4665-9935 OT Time Calculation (min): 9 min  Charges: OT General Charges $OT Visit: 1 Visit OT Treatments $Self Care/Home Management : 8-22 mins  Tyrone Schimke, OT Acute Rehabilitation Services Pager: [phone] Office: [phone]    Hortencia Pilar 10/11/2019, 12:39 PM

## 2019-10-11 NOTE — Progress Notes (Signed)
Still awaiting a bed in behavioral health.

## 2019-10-11 NOTE — Care Management Important Message (Signed)
Important Message  Patient Details  Name: Tyler Mcmillan MRN: [id number] Date of Birth: 1945-02-24   Medicare Important Message Given:  Yes     Shelda Altes 10/11/2019, 10:22 AM

## 2019-10-12 NOTE — Plan of Care (Signed)
  Problem: Pain Managment: Goal: General experience of comfort will improve Outcome: Progressing Note: Patient requested pain medications and was able to sleep in between med administrations.   Problem: Safety: Goal: Ability to remain free from injury will improve Outcome: Progressing Note: Patient continues to deny desire to harm himself. Psych consult to be done every 2-3 days until bed is available.

## 2019-10-12 NOTE — Progress Notes (Signed)
PT Cancellation Note  Patient Details Name: Tyler Mcmillan MRN: [id number] DOB: 1945/05/18   Cancelled Treatment:    Reason Eval/Treat Not Completed: Other (comment).  Pt declined both AM and PM attempts, will try again at another time.   Ramond Dial 10/12/2019, 4:27 PM   Mee Hives, PT MS Acute Rehab Dept. Number: [place] and [place]

## 2019-10-12 NOTE — Progress Notes (Addendum)
PROGRESS NOTE    [name]   IFO:[id number]  DOB: 05-06-1945  DOA: 09/27/2019 PCP: [name], MD   Brief Narrative:  Tyler Mcmillan   is an 74 y.o. male past medical history significant for alcohol abuse, none small cell lung cancer status post directed radiotherapy essential hypertension AAA repair presents on 09/27/2019 with intentional overdose of oxycodone for suicidal ideation was found to be in acute hypoxic respiratory failure in the setting of aspiration pneumonia, will start on IV Zosyn, now switched to oral antibiotics, as his fluid overload he was started on IV Lasix.   Subjective: Ongoing left sided chest pain- nothing helps the pain.     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      Consultants:   psych Procedures:  2 D ECHO   1. Left ventricular ejection fraction, by estimation, is 60 to 65%. The  left ventricle has normal function. The left ventricle has no regional  wall motion abnormalities. 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 Left ventricular diastolic parameters were normal.  2. Right ventricular systolic function is normal. The right ventricular  size is normal. Tricuspid regurgitation signal is inadequate for assessing  PA pressure.  3. The mitral valve is grossly normal. Trivial mitral valve  regurgitation. No evidence of mitral stenosis.  4. The aortic valve is abnormal. Aortic valve regurgitation is not  visualized. Mild aortic valve sclerosis is present, with no evidence of  aortic valve stenosis.  5. The inferior vena cava is dilated in size with <50% respiratory  variability, suggesting right atrial pressure of 15 mmHg.  6. Aortic dilatation noted. There is borderline dilatation of the aortic  root measuring 39 mm.  Antimicrobials:  Anti-infectives (From admission, onward)   Start     Dose/Rate Route Frequency Ordered Stop   10/03/19 1345  amoxicillin-clavulanate (AUGMENTIN) 875-125 MG per tablet 1 tablet  Status:  Discontinued        1 tablet Oral Every 12 hours 10/03/19 1259 10/04/19 1551   09/29/19 1215  amoxicillin-clavulanate (AUGMENTIN) 875-125 MG per tablet 1 tablet  Status:  Discontinued        1 tablet Oral Every 12 hours 09/29/19 1123 10/03/19 1259   09/27/19 1130  piperacillin-tazobactam (ZOSYN) IVPB 3.375 g  Status:  Discontinued        3.375 g 12.5 mL/hr over 240 Minutes  Intravenous Every 8 hours 09/27/19 0605 09/29/19 1123   09/27/19 0415  piperacillin-tazobactam (ZOSYN) IVPB 3.375 g        3.375 g 100 mL/hr over 30 Minutes Intravenous  Once 09/27/19 0408 09/27/19 0602       Objective: Vitals:   10/12/19 0225 10/12/19 0330 10/12/19 0954 10/12/19 1219  BP:  117/64 125/66 128/72  Pulse:  77 74 76  Resp:    18  Temp:  98 F (36.7 C)  98.6 F (37 C)  TempSrc:  Oral  Oral  SpO2:  94% (!) 88% 91%  Weight: 79.7 kg     Height:        Intake/Output Summary (Last 24 hours) at 10/12/2019 1444 Last data filed at 10/12/2019 1300 Gross per 24  hour  Intake 597 ml  Output 250 ml  Net 347 ml   Filed Weights   10/10/19 0453 10/11/19 0413 10/12/19 0225  Weight: 79.2 kg 79.3 kg 79.7 kg    Examination: General exam: Appears comfortable  HEENT: PERRLA, oral mucosa moist, no sclera icterus or thrush Respiratory system: Clear to auscultation. Respiratory effort normal. Cardiovascular system: S1 & S2 heard, RRR.   Gastrointestinal system: Abdomen soft, non-tender, nondistended. Normal bowel sounds. Central nervous system: Alert and oriented. No focal neurological deficits. Extremities: No cyanosis, clubbing or edema Skin: No rashes or ulcers Psychiatry:  Mood & affect appropriate.     Data Reviewed: I have personally reviewed following labs and imaging studies  CBC: No results for input(s): WBC, NEUTROABS, HGB, HCT, MCV, PLT in the last 168 hours. Basic Metabolic Panel: No results for input(s): NA, K, CL, CO2, GLUCOSE, BUN, CREATININE, CALCIUM, MG, PHOS in the last 168 hours. GFR: Estimated Creatinine Clearance: 77.8 mL/min (by C-G formula based on SCr of 0.86 mg/dL). Liver Function Tests: No results for input(s): AST, ALT, ALKPHOS, BILITOT, PROT, ALBUMIN in the last 168 hours. No results for input(s): LIPASE, AMYLASE in the last 168 hours. No results for input(s): AMMONIA in the last 168 hours. Coagulation Profile: No results for input(s): INR, PROTIME in the last 168 hours. Cardiac Enzymes: No results for input(s): CKTOTAL, CKMB, CKMBINDEX, TROPONINI in the last 168 hours. BNP (last 3 results) No results for input(s): PROBNP in the last 8760 hours. HbA1C: No results for input(s): HGBA1C in the last 72 hours. CBG: No results for input(s): GLUCAP in the last 168 hours. Lipid Profile: No results for input(s): CHOL, HDL, LDLCALC, TRIG, CHOLHDL, LDLDIRECT in the last 72 hours. Thyroid Function Tests: No results for input(s): TSH, T4TOTAL, FREET4, T3FREE, THYROIDAB in the last 72 hours. Anemia Panel: No results for  input(s): VITAMINB12, FOLATE, FERRITIN, TIBC, IRON, RETICCTPCT in the last 72 hours. Urine analysis:    Component Value Date/Time   COLORURINE YELLOW 03/31/2018 0630   APPEARANCEUR CLEAR 03/31/2018 0630   LABSPEC 1.042 (H) 03/31/2018 0630   PHURINE 5.0 03/31/2018 0630   GLUCOSEU NEGATIVE 03/31/2018 0630   HGBUR SMALL (A) 03/31/2018 0630   BILIRUBINUR NEGATIVE 03/31/2018 0630   KETONESUR NEGATIVE 03/31/2018 0630   PROTEINUR NEGATIVE 03/31/2018 0630   NITRITE NEGATIVE 03/31/2018 0630   LEUKOCYTESUR NEGATIVE 03/31/2018 0630   Sepsis Labs: @LABRCNTIP (procalcitonin:4,lacticidven:4) ) Recent Results (from the past 240 hour(s))  SARS Coronavirus 2 by RT PCR (hospital order, performed in [place] hospital lab) Nasopharyngeal Nasopharyngeal Swab     Status: None   Collection Time: 10/06/19  5:39 PM   Specimen: Nasopharyngeal Swab  Result Value Ref Range Status   SARS Coronavirus 2  NEGATIVE NEGATIVE Final    Comment: (NOTE) SARS-CoV-2 target nucleic acids are NOT DETECTED.  The SARS-CoV-2 RNA is generally detectable in upper and lower respiratory specimens during the acute phase of infection. The lowest concentration of SARS-CoV-2 viral copies this assay can detect is 250 copies / mL. A negative result does not preclude SARS-CoV-2 infection and should not be used as the sole basis for treatment or other patient management decisions.  A negative result may occur with improper specimen collection / handling, submission of specimen other than nasopharyngeal swab, presence of viral mutation(s) within the areas targeted by this assay, and inadequate number of viral copies (<250 copies / mL). A negative result must be combined with clinical observations, patient history, and epidemiological information.  Fact Sheet for Patients:   StrictlyIdeas.no  Fact Sheet for Healthcare Providers: BankingDealers.co.za  This test is not yet approved or   cleared by the [place] FDA and has been authorized for detection and/or diagnosis of SARS-CoV-2 by FDA under an Emergency Use Authorization (EUA).  This EUA will remain in effect (meaning this test can be used) for the duration of the COVID-19 declaration under Section 564(b)(1) of the Act, 21 U.S.C. section 360bbb-3(b)(1), unless the authorization is terminated or revoked sooner.  Performed at [facility], [place] [address] [place], [place] [address]          Radiology Studies: No results found.    Scheduled Meds: . amLODipine  10 mg Oral Daily  . diclofenac Sodium  2 g Topical QID  . docusate sodium  100 mg Oral BID  . enoxaparin (LOVENOX) injection  40 mg Subcutaneous Daily  . folic acid  1 mg Oral Daily  . furosemide  20 mg Oral Daily  . guaiFENesin  600 mg Oral BID  . multivitamin with minerals  1 tablet Oral Daily  . nicotine  14 mg Transdermal Daily  . pantoprazole  40 mg Oral BID  . spironolactone  50 mg Oral Daily  . thiamine  100 mg Oral Daily   Or  . thiamine  100 mg Intravenous Daily   Continuous Infusions:   LOS: 14 days      Debbe Odea, MD [facility] Pager: [email or web address] 10/12/2019, 2:44 PM

## 2019-10-12 NOTE — Progress Notes (Signed)
CSW called [facility] to check on the status of placement, spoke with Venora Maples who stated he would follow up with CSW on whether or not they will be able to accept pt.

## 2019-10-13 LAB — BASIC METABOLIC PANEL
Anion gap: 11 (ref 5–15)
BUN: 21 mg/dL (ref 8–23)
CO2: 26 mmol/L (ref 22–32)
Calcium: 9.4 mg/dL (ref 8.9–10.3)
Chloride: 99 mmol/L (ref 98–111)
Creatinine, Ser: 1 mg/dL (ref 0.61–1.24)
GFR calc Af Amer: >60 mL/min (ref >60)
GFR calc non Af Amer: >60 mL/min (ref >60)
Glucose, Bld: 111 mg/dL — ABNORMAL HIGH (ref 70–99)
Potassium: 4.3 mmol/L (ref 3.5–5.1)
Sodium: 136 mmol/L (ref 135–145)

## 2019-10-13 MED ORDER — FUROSEMIDE 20 MG PO TABS
20.0000 mg | ORAL_TABLET | Freq: Every day | ORAL | 0 refills | Status: DC
Start: 2019-10-13 — End: 2019-11-30

## 2019-10-13 MED ORDER — CLONAZEPAM 1 MG PO TABS: 1.0000 mg | ORAL_TABLET | Freq: Every evening | ORAL | 0 refills | Status: DC | PRN

## 2019-10-13 MED ORDER — AMLODIPINE BESYLATE 10 MG PO TABS
10.0000 mg | ORAL_TABLET | Freq: Every day | ORAL | 0 refills | Status: DC
Start: 2019-10-13 — End: 2019-11-30

## 2019-10-13 MED ORDER — OXYCODONE HCL 15 MG PO TABS
15.0000 mg | ORAL_TABLET | ORAL | 0 refills | Status: DC | PRN
Start: 2019-10-13 — End: 2019-11-30

## 2019-10-13 NOTE — Discharge Summary (Addendum)
Physician Discharge Summary  Tyler Mcmillan MGN:[id number] DOB: 1945-07-24 DOA: 09/27/2019  PCP: [name], MD  Admit date: 09/27/2019 Discharge date: 10/13/2019  Admitted From: home Disposition:  home   Recommendations for Outpatient Follow-up:  1. Outpatient psych follow-up  Home Health:  none  Discharge Condition:  stable   CODE STATUS:  Full code   Diet recommendation:  Heart healthy Consultations:  psych     Discharge Diagnoses:  Principal Problem:   Acute respiratory failure with hypoxia ([place]) Active Problems:   Aspiration pneumonia    Suicide attempt by substance overdose -  Intentional overdose of drug in tablet form   Non small cell cancer   AAA (abdominal aortic aneurysm)   Hypertensive heart disease without heart failure   Hypokalemia   Alcohol abuse   Tobacco dependence   Other cirrhosis of liver     Brief Summary: Tyler Mcmillan  is an 74 y.o.malepast medical history significant for alcohol abuse, none small cell lung cancer status post directed radiotherapy essential hypertension AAA repair presents on 09/27/2019 with intentional overdose of oxycodone for suicidal ideation was found to be in acute hypoxic respiratory failure in the setting of aspiration pneumonia, will start on IV Zosyn, now switched to oral antibiotics, as his fluid overload he was started on IV Lasix.  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  - he has had a stable LUL lung mass since 2020 - no evidence of progression   Discharge Exam: Vitals:   10/12/19 2054 10/13/19 0420  BP: 125/66 127/67  Pulse: 73 80  Resp: 18 16  Temp: 98 F (36.7 C) 97.9 F (36.6 C)  SpO2: 90% 91%   Vitals:   10/12/19 0954 10/12/19 1219 10/12/19 2054 10/13/19 0420  BP: 125/66 128/72 125/66 127/67  Pulse: 74 76 73 80  Resp:  18 18 16   Temp:  98.6 F (37 C) 98 F (36.7 C) 97.9 F (36.6 C)  TempSrc:  Oral Oral Oral  SpO2: (!) 88% 91% 90% 91%  Weight:    79 kg  Height:        General: Pt is alert, awake, not in acute distress Cardiovascular: RRR, S1/S2 +, no rubs, no gallops Respiratory: CTA bilaterally, no wheezing, no rhonchi Abdominal: Soft, NT, ND, bowel sounds + Extremities: no edema, no cyanosis   Discharge Instructions  Discharge Instructions    Diet - low sodium heart healthy   Complete by: As directed    Increase activity slowly   Complete by: As directed      Allergies as of 10/13/2019   No Known Allergies     Medication List    STOP taking these medications   clonazePAM 1 MG tablet Commonly  known as: KLONOPIN     TAKE these medications   amLODipine 10 MG tablet Commonly known as: NORVASC Take 1 tablet (10 mg total) by mouth daily.   docusate sodium 100 MG capsule Commonly known as: COLACE Take 1 capsule (100 mg total) by mouth 2 (two) times daily.   furosemide 20 MG tablet Commonly known as: LASIX Take 1 tablet (20 mg total) by mouth daily.   Oxycodone HCl 20 MG Tabs Take 20 mg by mouth every 6 (six) hours.   pantoprazole 40 MG tablet Commonly known as: PROTONIX Take 1 tablet (40 mg total) by mouth 2 (two) times daily.            Durable Medical Equipment  (From admission, onward)          Start     Ordered   10/02/19 1153  For home use only DME oxygen  Once       Question Answer Comment  Length of Need 6 Months   Mode or (Route) Nasal cannula   Frequency Continuous (stationary and portable oxygen unit needed)   Oxygen delivery system Gas      10/02/19 1152          Follow-up Information    [name], MD.   Specialty: Internal Medicine Contact information: [address] [place] [address] [phone]              No Known Allergies    DG Chest 2 View  Result Date: 09/28/2019 CLINICAL DATA:  Hypoxia EXAM: CHEST - 2 VIEW COMPARISON:  Chest radiograph 09/27/2019, CT chest 07/10/2018 FINDINGS: When accounting for differences in technique, likely similar loculated left pleural effusion. No discernible pneumothorax. New left basilar consolidation with silhouetting of the left hemidiaphragm. Similar chronic scarring and architectural distortion, most pronounced in the left upper lobe. Similar left lung volume loss with leftward shift of the mediastinum. Similar mild enlargement the cardiopericardial silhouette. Approximately 2.6 cm central cavitary mass in the left upper lobe, better characterized on prior CT chest from 07/10/2018 and consistent with known primary bronchogenic neoplasm. No acute osseous abnormality. Although suboptimally evaluated, grossly similar appearance of anterior wedging of multiple thoracic vertebral bodies and a chronic L1 compression fracture. IMPRESSION: 1. New left basilar consolidation, concerning for aspiration and/or pneumonia. 2. Loculated left pleural effusion, likely similar when accounting for differences in technique. 3. Redemonstrated left upper lobe mass, consistent with known primary bronchogenic neoplasm.CT chest could better assess for progression, if indicated. Electronically Signed   By: Margaretha Sheffield MD   On: 09/28/2019 08:34   CT CHEST W CONTRAST  Result Date: 09/28/2019 CLINICAL DATA:  74 year old  male with history of non-small cell lung cancer and pleural effusion. EXAM: CT CHEST WITH CONTRAST TECHNIQUE: Multidetector CT imaging of the chest was performed during intravenous contrast administration. CONTRAST:  69mL OMNIPAQUE IOHEXOL 300 MG/ML  SOLN COMPARISON:  Chest CT dated 07/10/2018. FINDINGS: Cardiovascular: There is no cardiomegaly or pericardial effusion. There is advanced 3 vessel coronary vascular calcification. There is severe atherosclerotic calcification of the thoracic aorta. No aneurysmal dilatation or dissection. Evaluation of the pulmonary arteries is limited due to suboptimal opacification of the peripheral branches. No central pulmonary artery embolus identified. Mediastinum/Nodes: No hilar adenopathy. Top-normal subcarinal lymph node measures 10 mm in short axis. The esophagus is grossly unremarkable. No mediastinal fluid collection. Lungs/Pleura: There is a 2.6 x 3.6 cm left upper lobe mass similar to prior CT in keeping with known malignancy. Areas of linear and  reticular densities adjacent to the mass likely scarring and related to post treatment changes. A 2.1 x 1.4 cm nodular density along the left mediastinal pleural (49/3) likely scarring. Overall there is volume loss in the left upper lobe. There is background of emphysema. There are small bilateral pleural effusions. The right pleural effusion is new since the prior CT. There are bibasilar compressive atelectasis or pneumonia. There is no pneumothorax. The central airways are patent. Upper Abdomen: Cirrhosis. Small perihepatic free fluid. 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 Musculoskeletal: Osteopenia with degenerative changes of the spine. L1 compression fracture, seen previously. No acute osseous pathology. IMPRESSION: 1. No CT evidence of central pulmonary artery embolus. 2. No significant interval change in the size of left upper lobe mass/malignancy and  surrounding post radiation changes/scarring. 3. Small bilateral pleural effusions. The right pleural effusion is new since the prior CT. 4. Bibasilar compressive atelectasis or pneumonia. 5. Cirrhosis with small perihepatic free fluid. 6. Aortic Atherosclerosis (ICD10-I70.0) and Emphysema (ICD10-J43.9). Electronically Signed   By: Anner Crete M.D.   On: 09/28/2019 22:30   DG CHEST PORT 1 VIEW  Result Date: 10/02/2019 CLINICAL DATA:  Cough.  Decreased breath sounds EXAM: PORTABLE CHEST 1 VIEW COMPARISON:  September 28, 2019 chest radiograph and chest CT FINDINGS: Pleural effusions are noted bilaterally. There is consolidation in the left base, likely due to a combination of pneumonia and compressive atelectasis. A lesser degree of suspected compressive atelectasis is noted in the right base. The nodular area which appears masslike on CT in the left upper lobe is again noted, measuring approximately 2 x 1.5 cm. No new opacity is appreciable. The heart size and pulmonary vascularity are normal. No adenopathy is appreciable. There is aortic atherosclerosis. No bone lesions. IMPRESSION: Stool effusions bilaterally. Combination of probable pneumonia and atelectasis left base with compressive atelectasis right base. Somewhat nodular appearing area which appears masslike on CT in the left upper lobe is again noted. No new opacity is evident. Stable cardiac silhouette. Aortic Atherosclerosis (ICD10-I70.0). Electronically Signed   By: Lowella Grip III M.D.   On: 10/02/2019 13:57   DG Chest Port 1 View  Result Date: 09/27/2019 CLINICAL DATA:  Suicide attempt, took 74 oxycodone and drink a bottle of wine EXAM: PORTABLE CHEST 1 VIEW COMPARISON:  CT 07/10/2018 FINDINGS: Chronic left pleural effusion resulting in some gradient opacity of the left lung reflecting layering fluid. Chronic scarring and architectural distortion changes in the left upper lung as well as a more masslike opacity likely corresponding to a  site of prior malignancy and or treatment related change. Background of chronically coarsened interstitial changes throughout both lungs. No right effusion. No visible pneumothorax. The aorta is calcified. The remaining cardiomediastinal contours are unremarkable. Stable mild cardiomegaly. No acute osseous or soft tissue abnormality. Degenerative changes are present in the imaged spine and shoulders. Telemetry leads overlie the chest. IMPRESSION: 1. Chronic left pleural effusion, not significantly changed from prior counting for differences in technique and inflation. 2. Low volumes and atelectasis. 3. Chronic scarring and architectural distortion changes in the left upper lung 4. Masslike opacity likely corresponding to a site of prior malignancy and or treatment related change in the left upper lobe. 5. Additional chronic coarsened interstitial changes. 6. Stable cardiomegaly. 7.  Aortic Atherosclerosis (ICD10-I70.0). Electronically Signed   By: Lovena Le M.D.   On: 09/27/2019 04:40   ECHOCARDIOGRAM COMPLETE  Result Date: 09/28/2019    ECHOCARDIOGRAM REPORT   Patient  Name:   Tyler Mcmillan Date of Exam: 09/28/2019 Medical Rec #:  [id number]             Height:       70.0 in Accession #:    [id number]            Weight:       198.6 lb Date of Birth:  03-Mar-1945             BSA:          2.081 m Patient Age:    21 years              BP:           133/61 mmHg Patient Gender: M                     HR:           89 bpm. Exam Location:  Inpatient Procedure: 2D Echo, Cardiac Doppler, Color Doppler and Intracardiac            Opacification Agent Indications:    Peripheral edema  History:        Patient has prior history of Echocardiogram examinations, most                 recent 04/03/2018. Risk Factors:Hypertension and Current Smoker.                 Acute respiratory falure with hypoxia. ETOH abuse.  Sonographer:    Clayton Lefort RDCS (AE) Referring Phys: [id number] [place]  1. Left ventricular  ejection fraction, by estimation, is 60 to 65%. The left ventricle has normal function. The left ventricle has no regional wall motion abnormalities. There is severe asymmetric left ventricular hypertrophy of the basal-septal and mild concentric LVH. Left ventricular diastolic parameters were normal.  2. Right ventricular systolic function is normal. The right ventricular size is normal. Tricuspid regurgitation signal is inadequate for assessing PA pressure.  3. The mitral valve is grossly normal. Trivial mitral valve regurgitation. No evidence of mitral stenosis.  4. The aortic valve is abnormal. Aortic valve regurgitation is not visualized. Mild aortic valve sclerosis is present, with no evidence of aortic valve stenosis.  5. The inferior vena cava is dilated in size with <50% respiratory variability, suggesting right atrial pressure of 15 mmHg.  6. Aortic dilatation noted. There is borderline dilatation of the aortic root measuring 39 mm. FINDINGS  Left Ventricle: Left ventricular ejection fraction, by estimation, is 60 to 65%. The left ventricle has normal function. The left ventricle has no regional wall motion abnormalities. Definity contrast agent was given IV to delineate the left ventricular  endocardial borders. The left ventricular internal cavity size was normal in size. There is severe asymmetric left ventricular hypertrophy of the basal-septal and mild concentric LVH segments. Left ventricular diastolic parameters were normal. Right Ventricle: The right ventricular size is normal. No increase in right ventricular wall thickness. Right ventricular systolic function is normal. Tricuspid regurgitation signal is inadequate for assessing PA pressure. Left Atrium: Left atrial size was normal in size. Right Atrium: Right atrial size was normal in size. Pericardium: There is no evidence of pericardial effusion. Mitral Valve: The mitral valve is grossly normal. Normal mobility of the mitral valve leaflets.  Trivial mitral valve regurgitation. No evidence of mitral valve stenosis. MV peak gradient, 6.6 mmHg. The mean mitral valve gradient is 3.0 mmHg with average heart rate of 88 bpm. Tricuspid Valve:  The tricuspid valve is grossly normal. Tricuspid valve regurgitation is trivial. No evidence of tricuspid stenosis. Aortic Valve: The aortic valve is abnormal. Aortic valve regurgitation is not visualized. Mild aortic valve sclerosis is present, with no evidence of aortic valve stenosis. There is mild calcification of the aortic valve. Aortic valve mean gradient measures 5.0 mmHg. Aortic valve peak gradient measures 8.1 mmHg. Aortic valve area, by VTI measures 3.50 cm. Pulmonic Valve: The pulmonic valve was not well visualized. Pulmonic valve regurgitation is not visualized. No evidence of pulmonic stenosis. Aorta: Aortic dilatation noted. There is borderline dilatation of the aortic root measuring 39 mm. Venous: The inferior vena cava is dilated in size with less than 50% respiratory variability, suggesting right atrial pressure of 15 mmHg. IAS/Shunts: No atrial level shunt detected by color flow Doppler.  LEFT VENTRICLE PLAX 2D LVIDd:         4.00 cm  Diastology LVIDs:         2.60 cm  LV e' lateral:   8.38 cm/s LV PW:         1.50 cm  LV E/e' lateral: 12.9 LV IVS:        2.10 cm  LV e' medial:    7.62 cm/s LVOT diam:     2.40 cm  LV E/e' medial:  14.2 LV SV:         105 LV SV Index:   51 LVOT Area:     4.52 cm  RIGHT VENTRICLE             IVC RV Basal diam:  4.70 cm     IVC diam: 2.40 cm RV Mid diam:    4.30 cm RV S prime:     12.60 cm/s TAPSE (M-mode): 2.0 cm LEFT ATRIUM              Index       RIGHT ATRIUM           Index LA diam:        4.00 cm  1.92 cm/m  RA Area:     22.20 cm LA Vol (A2C):   101.0 ml 48.53 ml/m RA Volume:   63.90 ml  30.70 ml/m LA Vol (A4C):   34.9 ml  16.77 ml/m LA Biplane Vol: 60.2 ml  28.93 ml/m  AORTIC VALVE AV Area (Vmax):    3.73 cm AV Area (Vmean):   3.67 cm AV Area (VTI):      3.50 cm AV Vmax:           142.00 cm/s AV Vmean:          107.000 cm/s AV VTI:            0.301 m AV Peak Grad:      8.1 mmHg AV Mean Grad:      5.0 mmHg LVOT Vmax:         117.00 cm/s LVOT Vmean:        86.800 cm/s LVOT VTI:          0.233 m LVOT/AV VTI ratio: 0.77  AORTA Ao Root diam: 3.90 cm Ao Asc diam:  3.80 cm MITRAL VALVE MV Area (PHT): 3.27 cm     SHUNTS MV Peak grad:  6.6 mmHg     Systemic VTI:  0.23 m MV Mean grad:  3.0 mmHg     Systemic Diam: 2.40 cm MV Vmax:       1.28 m/s MV Vmean:      79.1 cm/s  MV Decel Time: 232 msec MV E velocity: 108.00 cm/s MV A velocity: 93.80 cm/s MV E/A ratio:  1.15 Cherlynn Kaiser MD Electronically signed by Cherlynn Kaiser MD Signature Date/Time: 09/28/2019/5:00:44 PM    Final    VAS [place] LOWER EXTREMITY VENOUS (DVT)  Result Date: 09/29/2019  Lower Venous DVTStudy Indications: Swelling, and Pain.  Risk Factors: Cancer History of lung cancer. Comparison Study: Prior study 11-17-2016 Bilateral LEV was WNL Performing Technologist: Darlin Coco  Examination Guidelines: A complete evaluation includes B-mode imaging, spectral Doppler, color Doppler, and power Doppler as needed of all accessible portions of each vessel. Bilateral testing is considered an integral part of a complete examination. Limited examinations for reoccurring indications may be performed as noted. The reflux portion of the exam is performed with the patient in reverse Trendelenburg.  +---------+---------------+---------+-----------+----------+--------------+ RIGHT    CompressibilityPhasicitySpontaneityPropertiesThrombus Aging +---------+---------------+---------+-----------+----------+--------------+ CFV      Full           Yes      Yes                                 +---------+---------------+---------+-----------+----------+--------------+ SFJ      Full                                                        +---------+---------------+---------+-----------+----------+--------------+  FV Prox  Full                                                        +---------+---------------+---------+-----------+----------+--------------+ FV Mid   Full                                                        +---------+---------------+---------+-----------+----------+--------------+ FV DistalFull                                                        +---------+---------------+---------+-----------+----------+--------------+ PFV      Full                                                        +---------+---------------+---------+-----------+----------+--------------+ POP      Full           Yes      Yes                                 +---------+---------------+---------+-----------+----------+--------------+ PTV      Full                                                        +---------+---------------+---------+-----------+----------+--------------+  PERO     Full                                                        +---------+---------------+---------+-----------+----------+--------------+   +---------+---------------+---------+-----------+----------+--------------+ LEFT     CompressibilityPhasicitySpontaneityPropertiesThrombus Aging +---------+---------------+---------+-----------+----------+--------------+ CFV      Full           Yes      Yes                                 +---------+---------------+---------+-----------+----------+--------------+ SFJ      Full                                                        +---------+---------------+---------+-----------+----------+--------------+ FV Prox  Full                                                        +---------+---------------+---------+-----------+----------+--------------+ FV Mid   Full                                                        +---------+---------------+---------+-----------+----------+--------------+ FV DistalFull                                                         +---------+---------------+---------+-----------+----------+--------------+ PFV      Full                                                        +---------+---------------+---------+-----------+----------+--------------+ POP      Full           Yes      Yes                                 +---------+---------------+---------+-----------+----------+--------------+ PTV      Full                                                        +---------+---------------+---------+-----------+----------+--------------+ PERO     Full                                                        +---------+---------------+---------+-----------+----------+--------------+  Summary: RIGHT: - There is no evidence of deep vein thrombosis in the lower extremity.  - No cystic structure found in the popliteal fossa.  LEFT: - There is no evidence of deep vein thrombosis in the lower extremity.  - No cystic structure found in the popliteal fossa.  *See table(s) above for measurements and observations. Electronically signed by Deitra Mayo MD on 09/29/2019 at 6:38:00 AM.    Final    US Abdomen Limited RUQ  Result Date: 09/30/2019 CLINICAL DATA:  74 year old male with cirrhosis and ascites. EXAM: ULTRASOUND ABDOMEN LIMITED RIGHT UPPER QUADRANT COMPARISON:  03/30/2018 CT and 07/31/2012 ultrasound FINDINGS: Gallbladder: Multiple small mobile gallstones are noted, the largest measuring 4 mm. There is no evidence of gallbladder wall thickening, pericholecystic fluid or sonographic Murphy sign. Common bile duct: Diameter: 4 mm.  No intrahepatic or extrahepatic biliary dilatation. Liver: Slight nodularity of the hepatic contour is noted, compatible with this patient's known cirrhosis. No focal hepatic abnormalities or masses are identified. Portal vein is patent on color Doppler imaging with normal direction of blood flow towards the liver. Other: None. IMPRESSION: 1. Cholelithiasis  without evidence of acute cholecystitis. No biliary dilatation. 2. Cirrhosis. No focal hepatic mass identified. Electronically Signed   By: Margarette Canada M.D.   On: 09/30/2019 17:04     The results of significant diagnostics from this hospitalization (including imaging, microbiology, ancillary and laboratory) are listed below for reference.     Microbiology: Recent Results (from the past 240 hour(s))  SARS Coronavirus 2 by RT PCR (hospital order, performed in [facility] hospital lab) Nasopharyngeal Nasopharyngeal Swab     Status: None   Collection Time: 10/06/19  5:39 PM   Specimen: Nasopharyngeal Swab  Result Value Ref Range Status   SARS Coronavirus 2 NEGATIVE NEGATIVE Final    Comment: (NOTE) SARS-CoV-2 target nucleic acids are NOT DETECTED.  The SARS-CoV-2 RNA is generally detectable in upper and lower respiratory specimens during the acute phase of infection. The lowest concentration of SARS-CoV-2 viral copies this assay can detect is 250 copies / mL. A negative result does not preclude SARS-CoV-2 infection and should not be used as the sole basis for treatment or other patient management decisions.  A negative result may occur with improper specimen collection / handling, submission of specimen other than nasopharyngeal swab, presence of viral mutation(s) within the areas targeted by this assay, and inadequate number of viral copies (<250 copies / mL). A negative result must be combined with clinical observations, patient history, and epidemiological information.  Fact Sheet for Patients:   StrictlyIdeas.no  Fact Sheet for Healthcare Providers: BankingDealers.co.za  This test is not yet approved or  cleared by the [place] FDA and has been authorized for detection and/or diagnosis of SARS-CoV-2 by FDA under an Emergency Use Authorization (EUA).  This EUA will remain in effect (meaning this test can be used) for the duration  of the COVID-19 declaration under Section 564(b)(1) of the Act, 21 U.S.C. section 360bbb-3(b)(1), unless the authorization is terminated or revoked sooner.  Performed at [facility], [place] [address], [place], [place] [address]      Labs: BNP (last 3 results) Recent Labs    09/27/19 0403  BNP 474.2*   Basic Metabolic Panel: Recent Labs  Lab 10/13/19 0734  NA 136  K 4.3  CL 99  CO2 26  GLUCOSE 111*  BUN 21  CREATININE 1.00  CALCIUM 9.4   Liver Function Tests: No results for input(s): AST, ALT,  ALKPHOS, BILITOT, PROT, ALBUMIN in the last 168 hours. No results for input(s): LIPASE, AMYLASE in the last 168 hours. No results for input(s): AMMONIA in the last 168 hours. CBC: No results for input(s): WBC, NEUTROABS, HGB, HCT, MCV, PLT in the last 168 hours. Cardiac Enzymes: No results for input(s): CKTOTAL, CKMB, CKMBINDEX, TROPONINI in the last 168 hours. BNP: Invalid input(s): POCBNP CBG: No results for input(s): GLUCAP in the last 168 hours. D-Dimer No results for input(s): DDIMER in the last 72 hours. Hgb A1c No results for input(s): HGBA1C in the last 72 hours. Lipid Profile No results for input(s): CHOL, HDL, LDLCALC, TRIG, CHOLHDL, LDLDIRECT in the last 72 hours. Thyroid function studies No results for input(s): TSH, T4TOTAL, T3FREE, THYROIDAB in the last 72 hours.  Invalid input(s): FREET3 Anemia work up No results for input(s): VITAMINB12, FOLATE, FERRITIN, TIBC, IRON, RETICCTPCT in the last 72 hours. Urinalysis    Component Value Date/Time   COLORURINE YELLOW 03/31/2018 0630   APPEARANCEUR CLEAR 03/31/2018 0630   LABSPEC 1.042 (H) 03/31/2018 0630   PHURINE 5.0 03/31/2018 0630   GLUCOSEU NEGATIVE 03/31/2018 0630   HGBUR SMALL (A) 03/31/2018 0630   BILIRUBINUR NEGATIVE 03/31/2018 0630   KETONESUR NEGATIVE 03/31/2018 0630   PROTEINUR NEGATIVE 03/31/2018 0630   NITRITE NEGATIVE 03/31/2018 0630   LEUKOCYTESUR NEGATIVE 03/31/2018 0630   Sepsis  Labs Invalid input(s): PROCALCITONIN,  WBC,  LACTICIDVEN Microbiology Recent Results (from the past 240 hour(s))  SARS Coronavirus 2 by RT PCR (hospital order, performed in [place] hospital lab) Nasopharyngeal Nasopharyngeal Swab     Status: None   Collection Time: 10/06/19  5:39 PM   Specimen: Nasopharyngeal Swab  Result Value Ref Range Status   SARS Coronavirus 2 NEGATIVE NEGATIVE Final    Comment: (NOTE) SARS-CoV-2 target nucleic acids are NOT DETECTED.  The SARS-CoV-2 RNA is generally detectable in upper and lower respiratory specimens during the acute phase of infection. The lowest concentration of SARS-CoV-2 viral copies this assay can detect is 250 copies / mL. A negative result does not preclude SARS-CoV-2 infection and should not be used as the sole basis for treatment or other patient management decisions.  A negative result may occur with improper specimen collection / handling, submission of specimen other than nasopharyngeal swab, presence of viral mutation(s) within the areas targeted by this assay, and inadequate number of viral copies (<250 copies / mL). A negative result must be combined with clinical observations, patient history, and epidemiological information.  Fact Sheet for Patients:   StrictlyIdeas.no  Fact Sheet for Healthcare Providers: BankingDealers.co.za  This test is not yet approved or  cleared by the [place] FDA and has been authorized for detection and/or diagnosis of SARS-CoV-2 by FDA under an Emergency Use Authorization (EUA).  This EUA will remain in effect (meaning this test can be used) for the duration of the COVID-19 declaration under Section 564(b)(1) of the Act, 21 U.S.C. section 360bbb-3(b)(1), unless the authorization is terminated or revoked sooner.  Performed at [facility], [place] [address], [place],  [address]      Time coordinating discharge in minutes:  65  SIGNED:   Debbe Odea, MD  [facility] 10/13/2019, 2:48 PM

## 2019-10-13 NOTE — TOC Progression Note (Signed)
Transition of Care [facility]) - Progression Note    Patient Details  Name: Tyler Mcmillan MRN: [id number] Date of Birth: 11/08/1945  Transition of Care [facility]) CM/SW [place], [place] Phone Number: [phone] 10/13/2019, 11:01 AM  Clinical Narrative:     CSW followed up with [facility] and they stated that the referral was still under clinical review. CSW ascertain typically how long for a response and the staff was unsure.  TOC team will continue to assist with discharge planning needs.  Expected Discharge Plan: Acute to Acute Transfer Barriers to Discharge: Continued Medical Work up, Ship broker  Expected Discharge Plan and Services Expected Discharge Plan: Acute to Acute Transfer         Expected Discharge Date: 10/07/19                                     Social Determinants of Health (SDOH) Interventions    Readmission Risk Interventions Readmission Risk Prevention Plan 10/12/2019  Transportation Screening Complete  PCP or Specialist Appt within 5-7 Days Complete  Home Care Screening Complete  Medication Review (RN CM) Complete  Some recent data might be hidden

## 2019-10-13 NOTE — Consult Note (Signed)
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 male patient who reports prior history of Depression, Anxiety, PTSD who was admitted over a week ago due to suicide attempt overdosing on his medications. Patient seen, his chart his reviewed and re-assessed as requested. He is alert, oriented x 4, cooperative, pleasant, denies psychosis, delusions, mood swings, suicidal/homicidal plan, intent and ideations. He reports that his family are very supportive, he has a 5 year old son and 51 year old grandson who indicated their willingness to care for him. However, he is requesting for optimal management of his medical issues including pain. He denies alcohol and illicit drugs use.  Medical history: SCC of left lung (2018); HTN;  bladder cancer; and AAA   Past Psychiatric History: Anxiety, depression, PTSD.  Reports multiple trials of oral antidepressant medication, all of which were ineffective.  He is unable to recall the names of these particular medications.   Risk to Self:  denies Risk to Others:  denies Prior Inpatient  Therapy:   Prior Outpatient Therapy:    Past Medical History:  Past Medical History:  Diagnosis Date  . AAA (abdominal aortic aneurysm) without rupture ([place])    [place] ABD.  DONE  JUNE 2014  3.5  . Bladder cancer ([place])   . Chronic anxiety   . Chronic pain   . GERD (gastroesophageal reflux disease)   . Hypertension   . 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 ABDOMINAL AORTIC ENDOVASCULAR STENT GRAFT N/A 09/08/2016   Procedure: ABDOMINAL AORTIC ENDOVASCULAR STENT GRAFT;  Surgeon: [name], MD;  Location: [facility];  Service: Vascular;  Laterality: N/A;  . BIOPSY  03/31/2018   Procedure: BIOPSY;  Surgeon: Wonda Horner, MD;  Location: [facility] ENDOSCOPY;  Service: Endoscopy;;  . CHEST TUBE INSERTION Left 04/07/2018   Procedure: INSERTION PLEURAL DRAINAGE CATHETER USING ULTRASOUND AND FLURO GUIDANCE;  Surgeon: Grace Isaac, MD;  Location: [facility];  Service: Thoracic;  Laterality: Left;  . CYSTOSCOPY W/ URETERAL STENT PLACEMENT Left 10/04/2012   Procedure: CYSTOSCOPY WITH RETROGRADE PYELOGRAM/URETERAL STENT PLACEMENT   "POSSIBLE LEFT STENT";  Surgeon: Alexis Frock, MD;  Location: [facility];  Service: Urology;  Laterality: Left;  . ESOPHAGOGASTRODUODENOSCOPY (EGD) WITH PROPOFOL N/A 03/31/2018   Procedure: ESOPHAGOGASTRODUODENOSCOPY (EGD) WITH PROPOFOL;  Surgeon: Wonda Horner, MD;  Location: [facility] ENDOSCOPY;  Service: Endoscopy;  Laterality: N/A;  . IR THORACENTESIS ASP PLEURAL SPACE W/IMG GUIDE  03/31/2018  . IR THORACENTESIS ASP PLEURAL SPACE W/IMG GUIDE  04/03/2018  . IR THORACENTESIS ASP PLEURAL SPACE  W/IMG GUIDE  04/04/2018  . LEFT HEART CATH AND CORONARY ANGIOGRAPHY N/A 07/02/2016   Procedure: Left Heart Cath and Coronary Angiography;  Surgeon: Martinique, Peter M, MD;  Location: [facility] CV LAB;  Service: Cardiovascular;  Laterality: N/A;  . MEATOTOMY N/A 10/04/2012   Procedure: MEATOTOMY ADULT;  Surgeon:  Alexis Frock, MD;  Location: [facility];  Service: Urology;  Laterality: N/A;  . REMOVAL OF PLEURAL DRAINAGE CATHETER Left 05/24/2018   Procedure: REMOVAL OF PLEURAL DRAINAGE CATHETER;  Surgeon: Grace Isaac, MD;  Location: [place];  Service: Thoracic;  Laterality: Left;  . TRANSURETHRAL RESECTION OF BLADDER TUMOR WITH GYRUS (TURBT-GYRUS) N/A 10/04/2012   Procedure: TRANSURETHRAL RESECTION OF BLADDER TUMOR WITH GYRUS (TURBT-GYRUS);  Surgeon: Alexis Frock, MD;  Location: [facility];  Service: Urology;  Laterality: N/A;  . VIDEO BRONCHOSCOPY Bilateral 07/19/2016   Procedure: VIDEO BRONCHOSCOPY WITH FLUORO;  Surgeon: Marshell Garfinkel, MD;  Location: [facility] ENDOSCOPY;  Service: Cardiopulmonary;  Laterality: Bilateral;   Family History:  Family History  Problem Relation Age of Onset  . Cancer Mother   . Heart disease Father        before age 42  . Heart attack Father   . Cancer Brother    Family Psychiatric  History: Denies  Social History:  Social History   Substance and Sexual Activity  Alcohol Use Yes  . Alcohol/week: 24.0 standard drinks  . Types: 10 Glasses of wine, 14 Cans of beer per week   Comment: few glases wine per day, beer     Social History   Substance and Sexual Activity  Drug Use No    Social History   Socioeconomic History  . Marital status: Married    Spouse name: Not on file  . Number of children: Not on file  . Years of education: Not on file  . Highest education level: Not on file  Occupational History  . Not on file  Tobacco Use  . Smoking status: Current Every Day Smoker    Packs/day: 0.50    Years: 50.00    Pack years: 25.00    Types: Cigarettes  . Smokeless tobacco: Never Used  . Tobacco comment: pt refuses info  Vaping Use  . Vaping Use: Never used  Substance and Sexual Activity  . Alcohol use: Yes    Alcohol/week: 24.0 standard drinks    Types: 10 Glasses of wine, 14 Cans of beer per week    Comment: few  glases wine per day, beer  . Drug use: No  . Sexual activity: Not on file  Other Topics Concern  . Not on file  Social History Narrative  . Not on file   Social Determinants of Health   Financial Resource Strain:   . Difficulty of Paying Living Expenses: Not on file  Food Insecurity:   . Worried About Charity fundraiser in the Last Year: Not on file  . Ran Out of Food in the Last Year: Not on file  Transportation Needs:   . Lack of Transportation (Medical): Not on file  . Lack of Transportation (Non-Medical): Not on file  Physical Activity:   . Days of Exercise per Week: Not on file  . Minutes of Exercise per Session: Not on file  Stress:   . Feeling of Stress : Not on file  Social Connections:   . Frequency of Communication with Friends and Family: Not on file  . Frequency of Social Gatherings with Friends and Family: Not  on file  . Attends Religious Services: Not on file  . Active Member of Clubs or Organizations: Not on file  . Attends Archivist Meetings: Not on file  . Marital Status: Not on file   Additional Social History:    Allergies:  No Known Allergies  Labs:  Results for orders placed or performed during the hospital encounter of 09/27/19 (from the past 48 hour(s))  Basic metabolic panel     Status: Abnormal   Collection Time: 10/13/19  7:34 AM  Result Value Ref Range   Sodium 136 135 - 145 mmol/L   Potassium 4.3 3.5 - 5.1 mmol/L   Chloride 99 98 - 111 mmol/L   CO2 26 22 - 32 mmol/L   Glucose, Bld 111 (H) 70 - 99 mg/dL    Comment: Glucose reference range applies only to samples taken after fasting for at least 8 hours.   BUN 21 8 - 23 mg/dL   Creatinine, Ser 1.00 0.61 - 1.24 mg/dL   Calcium 9.4 8.9 - 10.3 mg/dL   GFR calc non Af Amer >60 >60 mL/min   GFR calc Af Amer >60 >60 mL/min   Anion gap 11 5 - 15    Comment: Performed at [facility] [address], [place], [place] [address]    Current Facility-Administered Medications   Medication Dose Route Frequency Provider Last Rate Last Admin  . acetaminophen (TYLENOL) tablet 650 mg  650 mg Oral Q6H PRN Karmen Bongo, MD   650 mg at 09/30/19 1328   Or  . acetaminophen (TYLENOL) suppository 650 mg  650 mg Rectal Q6H PRN Karmen Bongo, MD      . amLODipine (NORVASC) tablet 10 mg  10 mg Oral Daily Oretha Milch D, MD   10 mg at 10/13/19 1027  . bisacodyl (DULCOLAX) EC tablet 5 mg  5 mg Oral Daily PRN Karmen Bongo, MD   5 mg at 10/03/19 2120  . clonazePAM (KLONOPIN) tablet 1 mg  1 mg Oral QHS PRN Karmen Bongo, MD   1 mg at 10/12/19 2106  . diclofenac Sodium (VOLTAREN) 1 % topical gel 2 g  2 g Topical QID Debbe Odea, MD   2 g at 10/10/19 1337  . docusate sodium (COLACE) capsule 100 mg  100 mg Oral BID Karmen Bongo, MD   100 mg at 10/13/19 1028  . enoxaparin (LOVENOX) injection 40 mg  40 mg Subcutaneous Daily Karmen Bongo, MD   40 mg at 12/03/83 2778  . folic acid (FOLVITE) tablet 1 mg  1 mg Oral Daily Karmen Bongo, MD   1 mg at 10/13/19 1028  . furosemide (LASIX) tablet 20 mg  20 mg Oral Daily Charlynne Cousins, MD   20 mg at 10/13/19 1028  . guaiFENesin (MUCINEX) 12 hr tablet 600 mg  600 mg Oral BID Oretha Milch D, MD   600 mg at 10/13/19 1026  . hydrALAZINE (APRESOLINE) injection 5 mg  5 mg Intravenous Q4H PRN Karmen Bongo, MD      . multivitamin with minerals tablet 1 tablet  1 tablet Oral Daily Karmen Bongo, MD   1 tablet at 10/13/19 1027  . nicotine (NICODERM CQ - dosed in mg/24 hours) patch 14 mg  14 mg Transdermal Daily Karmen Bongo, MD   14 mg at 10/13/19 1029  . ondansetron (ZOFRAN) tablet 4 mg  4 mg Oral Q6H PRN Karmen Bongo, MD       Or  . ondansetron [facility] injection 4 mg  4 mg Intravenous Q6H PRN Karmen Bongo, MD      . oxyCODONE (Oxy IR/ROXICODONE) immediate release tablet 15 mg  15 mg Oral Q4H PRN Oretha Milch D, MD   15 mg at 10/13/19 1029  . pantoprazole (PROTONIX) EC tablet 40 mg  40 mg Oral BID Karmen Bongo,  MD   40 mg at 10/13/19 1028  . polyethylene glycol (MIRALAX / GLYCOLAX) packet 17 g  17 g Oral Daily PRN Karmen Bongo, MD   17 g at 10/02/19 0115  . spironolactone (ALDACTONE) tablet 50 mg  50 mg Oral Daily Charlynne Cousins, MD   50 mg at 10/13/19 1026  . thiamine tablet 100 mg  100 mg Oral Daily Karmen Bongo, MD   100 mg at 10/13/19 1028   Or  . thiamine (B-1) injection 100 mg  100 mg Intravenous Daily Karmen Bongo, MD        Musculoskeletal: Strength & Muscle Tone: within normal limits Gait & Station: normal Patient leans: N/A  Psychiatric Specialty Exam: Physical Exam Psychiatric:        Attention and Perception: Attention normal.        Mood and Affect: Mood normal.        Speech: Speech normal.        Behavior: Behavior normal. Behavior is cooperative.        Thought Content: Thought content normal.        Cognition and Memory: Cognition and memory normal.        Judgment: Judgment normal.     Review of Systems  Psychiatric/Behavioral: Negative.     Blood pressure 127/67, pulse 80, temperature 97.9 F (36.6 C), temperature source Oral, resp. rate 16, height 5\' 10"  (1.778 m), weight 79 kg, SpO2 91 %.Body mass index is 25 kg/m.  General Appearance: Casual  Eye Contact:  Good  Speech:  Clear and Coherent and Normal Rate  Volume:  Normal  Mood:  Euthymic  Affect:  Appropriate and Congruent  Thought Process:  Coherent, Goal Directed and Linear  Orientation:  Full (Time, Place, and Person)  Thought Content:  Logical  Suicidal Thoughts:  No  Homicidal Thoughts:  No  Memory:  Immediate;   Good Recent;   Good Remote;   Good  Judgement:  Intact  Insight:  Present  Psychomotor Activity:  Normal  Concentration:  Concentration: Good and Attention Span: Good  Recall:  Good  Fund of Knowledge:  Good  Language:  Good  Akathisia:  No  Handed:  Right  AIMS (if indicated):     Assets:  Communication Skills Desire for Improvement Social Support Others:  family   ADL's:  Intact  Cognition:  WNL  Sleep:        Treatment Plan Summary:  74 year old male with history of Major depression, Anxiety and PTSD who was admitted due to suicide attempt by overdose. Today,  patient is alert, oriented, cooperative, denies psychosis, delusions or self harming thoughts. Today, based on my assessment, patient no longer meet criteria for inpatient Geriatric psychiatric facility and he is cleared by psychiatric service.  Recommendations: -Social worker to refer patient for outpatient bereavement therapy to process the death of his wife. -Optimize pain/medical problem treatment. -Discontinue 1: 1 sitter   Disposition: No evidence of imminent risk to self or others at present.   Patient does not meet criteria for psychiatric inpatient admission. Supportive therapy provided about ongoing stressors. Psychiatric service siging off. Re-consult as needed  Corena Pilgrim, MD 10/13/2019  12:51 PM

## 2019-11-20 ENCOUNTER — Emergency Department (HOSPITAL_BASED_OUTPATIENT_CLINIC_OR_DEPARTMENT_OTHER)
Admit: 2019-11-20 | Discharge: 2019-11-20 | Disposition: A | Discharge status: Home / Self Care | Payer: Medicare Other | Attending: Emergency Medicine | Admitting: Emergency Medicine

## 2019-11-20 ENCOUNTER — Emergency Department (HOSPITAL_COMMUNITY): Payer: Medicare Other

## 2019-11-20 ENCOUNTER — Inpatient Hospital Stay (HOSPITAL_COMMUNITY)
Admission: EM | Admit: 2019-11-20 | Discharge: 2019-11-30 | DRG: 291 | Disposition: A | Discharge status: Skilled Nursing Facility | Payer: Medicare Other | Attending: Internal Medicine | Admitting: Internal Medicine

## 2019-11-20 ENCOUNTER — Encounter (HOSPITAL_COMMUNITY): Payer: Self-pay

## 2019-11-20 DIAGNOSIS — C3412 Malignant neoplasm of upper lobe, left bronchus or lung: Secondary | ICD-10-CM | POA: Diagnosis present

## 2019-11-20 DIAGNOSIS — I714 Abdominal aortic aneurysm, without rupture: Secondary | ICD-10-CM | POA: Diagnosis present

## 2019-11-20 DIAGNOSIS — Z8551 Personal history of malignant neoplasm of bladder: Secondary | ICD-10-CM | POA: Diagnosis not present

## 2019-11-20 DIAGNOSIS — R609 Edema, unspecified: Secondary | ICD-10-CM | POA: Diagnosis not present

## 2019-11-20 DIAGNOSIS — Z66 Do not resuscitate: Secondary | ICD-10-CM | POA: Diagnosis not present

## 2019-11-20 DIAGNOSIS — R0600 Dyspnea, unspecified: Secondary | ICD-10-CM

## 2019-11-20 DIAGNOSIS — Z20822 Contact with and (suspected) exposure to covid-19: Secondary | ICD-10-CM | POA: Diagnosis present

## 2019-11-20 DIAGNOSIS — J189 Pneumonia, unspecified organism: Secondary | ICD-10-CM | POA: Diagnosis present

## 2019-11-20 DIAGNOSIS — D509 Iron deficiency anemia, unspecified: Secondary | ICD-10-CM | POA: Diagnosis present

## 2019-11-20 DIAGNOSIS — F17210 Nicotine dependence, cigarettes, uncomplicated: Secondary | ICD-10-CM | POA: Diagnosis present

## 2019-11-20 DIAGNOSIS — Z9114 Patient's other noncompliance with medication regimen: Secondary | ICD-10-CM | POA: Diagnosis not present

## 2019-11-20 DIAGNOSIS — I110 Hypertensive heart disease with heart failure: Principal | ICD-10-CM | POA: Diagnosis present

## 2019-11-20 DIAGNOSIS — F1010 Alcohol abuse, uncomplicated: Secondary | ICD-10-CM | POA: Diagnosis not present

## 2019-11-20 DIAGNOSIS — T501X6A Underdosing of loop [high-ceiling] diuretics, initial encounter: Secondary | ICD-10-CM | POA: Diagnosis present

## 2019-11-20 DIAGNOSIS — Z515 Encounter for palliative care: Secondary | ICD-10-CM | POA: Diagnosis not present

## 2019-11-20 DIAGNOSIS — J9621 Acute and chronic respiratory failure with hypoxia: Secondary | ICD-10-CM | POA: Diagnosis present

## 2019-11-20 DIAGNOSIS — E876 Hypokalemia: Secondary | ICD-10-CM | POA: Diagnosis not present

## 2019-11-20 DIAGNOSIS — K219 Gastro-esophageal reflux disease without esophagitis: Secondary | ICD-10-CM | POA: Diagnosis present

## 2019-11-20 DIAGNOSIS — E538 Deficiency of other specified B group vitamins: Secondary | ICD-10-CM | POA: Diagnosis present

## 2019-11-20 DIAGNOSIS — Z79891 Long term (current) use of opiate analgesic: Secondary | ICD-10-CM | POA: Diagnosis not present

## 2019-11-20 DIAGNOSIS — Z6828 Body mass index (BMI) 28.0-28.9, adult: Secondary | ICD-10-CM

## 2019-11-20 DIAGNOSIS — G8929 Other chronic pain: Secondary | ICD-10-CM | POA: Diagnosis present

## 2019-11-20 DIAGNOSIS — E669 Obesity, unspecified: Secondary | ICD-10-CM | POA: Diagnosis present

## 2019-11-20 DIAGNOSIS — D649 Anemia, unspecified: Secondary | ICD-10-CM | POA: Diagnosis not present

## 2019-11-20 DIAGNOSIS — Z79899 Other long term (current) drug therapy: Secondary | ICD-10-CM

## 2019-11-20 DIAGNOSIS — F17200 Nicotine dependence, unspecified, uncomplicated: Secondary | ICD-10-CM | POA: Diagnosis present

## 2019-11-20 DIAGNOSIS — F1020 Alcohol dependence, uncomplicated: Secondary | ICD-10-CM | POA: Diagnosis present

## 2019-11-20 DIAGNOSIS — Z7189 Other specified counseling: Secondary | ICD-10-CM | POA: Diagnosis not present

## 2019-11-20 DIAGNOSIS — F419 Anxiety disorder, unspecified: Secondary | ICD-10-CM | POA: Diagnosis present

## 2019-11-20 DIAGNOSIS — Z8249 Family history of ischemic heart disease and other diseases of the circulatory system: Secondary | ICD-10-CM

## 2019-11-20 DIAGNOSIS — I5033 Acute on chronic diastolic (congestive) heart failure: Secondary | ICD-10-CM | POA: Diagnosis present

## 2019-11-20 DIAGNOSIS — I5031 Acute diastolic (congestive) heart failure: Secondary | ICD-10-CM | POA: Diagnosis not present

## 2019-11-20 LAB — COMPREHENSIVE METABOLIC PANEL
ALT: 12 U/L (ref 0–44)
AST: 22 U/L (ref 15–41)
Albumin: 3.4 g/dL — ABNORMAL LOW (ref 3.5–5.0)
Alkaline Phosphatase: 68 U/L (ref 38–126)
Anion gap: 9 (ref 5–15)
BUN: 16 mg/dL (ref 8–23)
CO2: 30 mmol/L (ref 22–32)
Calcium: 8.4 mg/dL — ABNORMAL LOW (ref 8.9–10.3)
Chloride: 98 mmol/L (ref 98–111)
Creatinine, Ser: 0.83 mg/dL (ref 0.61–1.24)
GFR, Estimated: >60 mL/min (ref >60)
Glucose, Bld: 118 mg/dL — ABNORMAL HIGH (ref 70–99)
Potassium: 4.1 mmol/L (ref 3.5–5.1)
Sodium: 137 mmol/L (ref 135–145)
Total Bilirubin: 0.4 mg/dL (ref 0.3–1.2)
Total Protein: 7.6 g/dL (ref 6.5–8.1)

## 2019-11-20 LAB — CBC WITH DIFFERENTIAL/PLATELET
Abs Immature Granulocytes: 0.05 10*3/uL (ref 0.00–0.07)
Basophils Absolute: 0 10*3/uL (ref 0.0–0.1)
Basophils Relative: 0 %
Eosinophils Absolute: 0 10*3/uL (ref 0.0–0.5)
Eosinophils Relative: 0 %
HCT: 34 % — ABNORMAL LOW (ref 39.0–52.0)
Hemoglobin: 9.7 g/dL — ABNORMAL LOW (ref 13.0–17.0)
Immature Granulocytes: 1 %
Lymphocytes Relative: 5 %
Lymphs Abs: 0.4 10*3/uL — ABNORMAL LOW (ref 0.7–4.0)
MCH: 24.4 pg — ABNORMAL LOW (ref 26.0–34.0)
MCHC: 28.5 g/dL — ABNORMAL LOW (ref 30.0–36.0)
MCV: 85.4 fL (ref 80.0–100.0)
Monocytes Absolute: 0.8 10*3/uL (ref 0.1–1.0)
Monocytes Relative: 9 %
Neutro Abs: 7.1 10*3/uL (ref 1.7–7.7)
Neutrophils Relative %: 85 %
Platelets: 300 10*3/uL (ref 150–400)
RBC: 3.98 MIL/uL — ABNORMAL LOW (ref 4.22–5.81)
RDW: 20.4 % — ABNORMAL HIGH (ref 11.5–15.5)
WBC: 8.3 10*3/uL (ref 4.0–10.5)
nRBC: 0.2 % (ref 0.0–0.2)

## 2019-11-20 LAB — FIBRINOGEN: Fibrinogen: 431 mg/dL (ref 210–475)

## 2019-11-20 LAB — PROCALCITONIN: Procalcitonin: <0.1 ng/mL

## 2019-11-20 LAB — RESPIRATORY PANEL BY RT PCR (FLU A&B, COVID)
Influenza A by PCR: NEGATIVE
Influenza B by PCR: NEGATIVE
SARS Coronavirus 2 by RT PCR: NEGATIVE

## 2019-11-20 LAB — FERRITIN: Ferritin: 13 ng/mL — ABNORMAL LOW (ref 24–336)

## 2019-11-20 LAB — BRAIN NATRIURETIC PEPTIDE: B Natriuretic Peptide: 1251.9 pg/mL — ABNORMAL HIGH (ref 0.0–100.0)

## 2019-11-20 LAB — TRIGLYCERIDES: Triglycerides: 74 mg/dL (ref <150)

## 2019-11-20 LAB — C-REACTIVE PROTEIN: CRP: 1.3 mg/dL — ABNORMAL HIGH (ref <1.0)

## 2019-11-20 LAB — LACTATE DEHYDROGENASE: LDH: 145 U/L (ref 98–192)

## 2019-11-20 LAB — LACTIC ACID, PLASMA: Lactic Acid, Venous: 1.4 mmol/L (ref 0.5–1.9)

## 2019-11-20 LAB — D-DIMER, QUANTITATIVE: D-Dimer, Quant: 1.8 ug/mL-FEU — ABNORMAL HIGH (ref 0.00–0.50)

## 2019-11-20 MED ORDER — LORAZEPAM 2 MG/ML IJ SOLN: 1.0000 mg | INTRAMUSCULAR | Status: AC | PRN

## 2019-11-20 MED ORDER — ADULT MULTIVITAMIN W/MINERALS CH
1.0000 | ORAL_TABLET | Freq: Every day | ORAL | Status: DC
  Administered 2019-11-21 – 2019-11-29 (×9): 1 via ORAL
  Filled 2019-11-20 (×9): qty 1

## 2019-11-20 MED ORDER — THIAMINE HCL 100 MG/ML IJ SOLN
100.0000 mg | Freq: Every day | INTRAMUSCULAR | Status: DC
  Filled 2019-11-20: qty 2

## 2019-11-20 MED ORDER — THIAMINE HCL 100 MG PO TABS
100.0000 mg | ORAL_TABLET | Freq: Every day | ORAL | Status: DC
  Administered 2019-11-21 – 2019-11-30 (×10): 100 mg via ORAL
  Filled 2019-11-20 (×10): qty 1

## 2019-11-20 MED ORDER — FOLIC ACID 1 MG PO TABS
1.0000 mg | ORAL_TABLET | Freq: Every day | ORAL | Status: DC
  Administered 2019-11-21 – 2019-11-29 (×9): 1 mg via ORAL
  Filled 2019-11-20 (×9): qty 1

## 2019-11-20 MED ORDER — LORAZEPAM 1 MG PO TABS: 1.0000 mg | ORAL_TABLET | ORAL | Status: AC | PRN

## 2019-11-20 MED ORDER — FUROSEMIDE 10 MG/ML IJ SOLN
40.0000 mg | Freq: Once | INTRAMUSCULAR | Status: AC
  Administered 2019-11-20: 40 mg via INTRAVENOUS
  Filled 2019-11-20: qty 4

## 2019-11-20 MED ORDER — IOHEXOL 350 MG/ML SOLN
100.0000 mL | Freq: Once | INTRAVENOUS | Status: AC | PRN
  Administered 2019-11-20: 100 mL via INTRAVENOUS

## 2019-11-20 NOTE — ED Notes (Signed)
Vascular tech at bedside.

## 2019-11-20 NOTE — H&P (Signed)
History and Physical   Tyler Mcmillan GMW:[id number] DOB: [birth year] DOA: 11/20/2019  Referring MD/NP/PA: Dr. Vanita Panda  PCP: [name], MD   Outpatient Specialists: Dr. Servando Snare.  And Dr. Lorna Few  Patient coming from: Home  Chief Complaint: Shortness of breath  HPI: Tyler Mcmillan is a 74 y.o. male with medical history significant of bladder cancer, abdominal aortic aneurysm, chronic anxiety, alcoholism, GERD, hypertension, squamous cell carcinoma of the left lung who was diagnosed previously in 2018.  Followed by Dr. [name] and Dr. Earlie Server.  He was also seen by Dr. [name].  He has stereotactic radiation therapy into 08/25/2017.  Patient appears to have lost follow-up since last year.  He was referred for continued oncology follow-up.  Still Thoracics surgery was evaluated patient for his resection.  He came to the ER today complaining of shortness of breath cough and feeling terrible.  He is slightly tremulous.  He still drinks alcohol and has had drinks today.  Patient has been on chronic pain medication as well.  His evaluation today showed increasing bilateral lower extremity edema with chronic stasis, has run out of his oral Lasix for a while.  His BNP is elevated but CT and x-ray of the chest do not showed overt CHF.  Is being admitted to the hospital for further evaluation and treatment..  ED Course: Temperature 98.2 blood pressure 177/84, pulse 130 respirate of 25 oxygen sat 82% on room air.  White count is 8.3 hemoglobin 9.7 platelets 300.  BNP of 1251 LDH 145 triglyceride 74 ferritin 13 CRP 1.3 lactic acid 1.4 procalcitonin less than 0.1.  COVID-19 screen is negative.  CT angiogram of the chest showed small right pleural effusion with 3.5 x 2.7 cm mass in the left upper lobe no evidence of PE.  Patient being admitted to the hospital with acute respiratory failure with hypoxia probably related to his malignancy, pleural effusions pneumonia or CHF.  Review of  Systems: As per HPI otherwise 10 point review of systems negative.    Past Medical History:  Diagnosis Date  . AAA (abdominal aortic aneurysm) without rupture ([place])    [place] ABD.  DONE  JUNE 2014  3.5  . Bladder cancer ([place])   . Chronic anxiety   . Chronic pain   . GERD (gastroesophageal reflux disease)   . Hypertension   . Stage I squamous cell carcinoma of left lung ([place]) 08/09/2016    Past Surgical History:  Procedure Laterality Date  . ABDOMINAL AORTIC ENDOVASCULAR STENT GRAFT N/A 09/08/2016   Procedure: ABDOMINAL AORTIC ENDOVASCULAR STENT GRAFT;  Surgeon: [name], MD;  Location: [place];  Service: Vascular;  Laterality: N/A;  . BIOPSY  03/31/2018   Procedure: BIOPSY;  Surgeon: Wonda Horner, MD;  [facility] END[facility]COPY;  Service: Endoscopy;;  . CHEST TUBE INSERTION Left 04/07/2018   Procedure: INSERTION PLEURAL DRAINAGE CATHETER USING ULTRASOUND AND FLURO GUIDANCE;  Surgeon: Grace Isaac, MD;  Location: [place];  Service: Thoracic;  Laterality: Left;  . CYSTOSCOPY W/ URETERAL STENT PLACEMENT Left 10/04/2012   Procedure: CYSTOSCOPY WITH RETROGRADE PYELOGRAM/URETERAL STENT PLACEMENT   "POSSIBLE LEFT STENT";  Surgeon: Alexis Frock, MD;  Location: [facility];  Service: Urology;  Laterality: Left;  . ESOPHAGOGASTRODUODENOSCOPY (EGD) WITH PROPOFOL N/A 03/31/2018   Procedure: ESOPHAGOGASTRODUODENOSCOPY (EGD) WITH PROPOFOL;  Surgeon: Wonda Horner, MD;  Location: [facility] ENDOSCOPY;  Service: Endoscopy;  Laterality: N/A;  . IR THORACENTESIS ASP PLEURAL SPACE W/IMG GUIDE  03/31/2018  . IR THORACENTESIS ASP  PLEURAL SPACE W/IMG GUIDE  04/03/2018  . IR THORACENTESIS ASP PLEURAL SPACE W/IMG GUIDE  04/04/2018  . LEFT HEART CATH AND CORONARY ANGIOGRAPHY N/A 07/02/2016   Procedure: Left Heart Cath and Coronary Angiography;  Surgeon: Martinique, Peter M, MD;  Location: [place] [facility];  Service: Cardiovascular;  Laterality: N/A;  . MEATOTOMY N/A 10/04/2012   Procedure: MEATOTOMY ADULT;   Surgeon: Alexis Frock, MD;  Location: [facility];  Service: Urology;  Laterality: N/A;  . REMOVAL OF PLEURAL DRAINAGE CATHETER Left 05/24/2018   Procedure: REMOVAL OF PLEURAL DRAINAGE CATHETER;  Surgeon: Grace Isaac, MD;  Location: [place];  Service: Thoracic;  Laterality: Left;  . TRANSURETHRAL RESECTION OF BLADDER TUMOR WITH GYRUS (TURBT-GYRUS) N/A 10/04/2012   Procedure: TRANSURETHRAL RESECTION OF BLADDER TUMOR WITH GYRUS (TURBT-GYRUS);  Surgeon: Alexis Frock, MD;  Location: [facility];  Service: Urology;  Laterality: N/A;  . VIDEO BRONCHOSCOPY Bilateral 07/19/2016   Procedure: VIDEO BRONCHOSCOPY WITH FLUORO;  Surgeon: Marshell Garfinkel, MD;  Location: [facility] ENDOSCOPY;  Service: Cardiopulmonary;  Laterality: Bilateral;     reports that he has been smoking cigarettes. He has a 25.00 pack-year smoking history. He has never used smokeless tobacco. He reports current alcohol use of about 24.0 standard drinks of alcohol per week. He reports that he does not use drugs.  No Known Allergies  Family History  Problem Relation Age of Onset  . Cancer Mother   . Heart disease Father        before age 79  . Heart attack Father   . Cancer Brother      Prior to Admission medications   Medication Sig Start Date End Date Taking? Authorizing Provider  clonazePAM (KLONOPIN) 1 MG tablet Take 1 tablet (1 mg total) by mouth at bedtime as needed for anxiety. 10/13/19  Yes Debbe Odea, MD  furosemide (LASIX) 20 MG tablet Take 1 tablet (20 mg total) by mouth daily. 10/13/19  Yes Debbe Odea, MD  Oxycodone HCl 20 MG TABS Take 20 mg by mouth every 4 (four) hours.  09/11/19  Yes [provider]  pantoprazole (PROTONIX) 40 MG tablet Take 1 tablet (40 mg total) by mouth 2 (two) times daily. 04/08/18  Yes Charlynne Cousins, MD  senna (SENOKOT) 8.6 MG tablet Take 1 tablet by mouth daily.   Yes [provider]  amLODipine (NORVASC) 10 MG tablet Take 1 tablet (10 mg  total) by mouth daily. Patient not taking: Reported on 11/20/2019 10/13/19   Debbe Odea, MD  docusate sodium (COLACE) 100 MG capsule Take 1 capsule (100 mg total) by mouth 2 (two) times daily. Patient not taking: Reported on 11/20/2019 10/07/19   Charlynne Cousins, MD  oxyCODONE (ROXICODONE) 15 MG immediate release tablet Take 1 tablet (15 mg total) by mouth every 4 (four) hours as needed for severe pain. Patient not taking: Reported on 11/20/2019 10/13/19   Debbe Odea, MD    Physical Exam: Vitals:   11/20/19 1900 11/20/19 1940 11/20/19 2000 11/20/19 2030  BP: (!) 164/90 (!) 177/84 (!) 145/75 (!) 163/82  Pulse: 93 (!) 113 87 89  Resp: (!) 25 (!) 25 19 (!) 22  Temp:      TempSrc:      SpO2: 99% 94% 96% 95%      Constitutional: Obese, tremulous, no distress Vitals:   11/20/19 1900 11/20/19 1940 11/20/19 2000 11/20/19 2030  BP: (!) 164/90 (!) 177/84 (!) 145/75 (!) 163/82  Pulse: 93 (!) 113 87 89  Resp: (!) 25 (!) 25 19 (!) 22  Temp:      TempSrc:      SpO2: 99% 94% 96% 95%   Eyes: PERRL, lids and conjunctivae normal ENMT: Mucous membranes are moist. Posterior pharynx clear of any exudate or lesions.Normal dentition.  Neck: normal, supple, no masses, no thyromegaly Respiratory: Decreased air entry bilaterally with some crackles, rhonchi, dull to percussion especially on the left. No accessory muscle use.  Cardiovascular: Regular rate and rhythm, no murmurs / rubs / gallops.  2+ pedal edema 2+ pedal pulses. No carotid bruits.  Abdomen: no tenderness, no masses palpated. No hepatosplenomegaly. Bowel sounds positive.  Musculoskeletal: no clubbing / cyanosis. No joint deformity upper and lower extremities. Good ROM, no contractures. Normal muscle tone.  Skin: no rashes, lesions, ulcers. No induration Neurologic: CN 2-12 grossly intact. Sensation intact, DTR normal. Strength 5/5 in all 4.  Psychiatric: Normal judgment and insight. Alert and oriented x 3.  Depressed mood.      Labs on Admission: I have personally reviewed following labs and imaging studies  CBC: Recent Labs  Lab 11/20/19 1547  WBC 8.3  NEUTROABS 7.1  HGB 9.7*  HCT 34.0*  MCV 85.4  PLT 829   Basic Metabolic Panel: Recent Labs  Lab 11/20/19 1547  NA 137  K 4.1  CL 98  CO2 30  GLUCOSE 118*  BUN 16  CREATININE 0.83  CALCIUM 8.4*   GFR: CrCl cannot be calculated (Unknown ideal weight.). Liver Function Tests: Recent Labs  Lab 11/20/19 1547  AST 22  ALT 12  ALKPHOS 68  BILITOT 0.4  PROT 7.6  ALBUMIN 3.4*   No results for input(s): LIPASE, AMYLASE in the last 168 hours. No results for input(s): AMMONIA in the last 168 hours. Coagulation Profile: No results for input(s): INR, PROTIME in the last 168 hours. Cardiac Enzymes: No results for input(s): CKTOTAL, CKMB, CKMBINDEX, TROPONINI in the last 168 hours. BNP (last 3 results) No results for input(s): PROBNP in the last 8760 hours. HbA1C: No results for input(s): HGBA1C in the last 72 hours. CBG: No results for input(s): GLUCAP in the last 168 hours. Lipid Profile: Recent Labs    11/20/19 1547  TRIG 74   Thyroid Function Tests: No results for input(s): TSH, T4TOTAL, FREET4, T3FREE, THYROIDAB in the last 72 hours. Anemia Panel: Recent Labs    11/20/19 1547  FERRITIN 13*   Urine analysis:    Component Value Date/Time   COLORURINE YELLOW 03/31/2018 0630   APPEARANCEUR CLEAR 03/31/2018 0630   LABSPEC 1.042 (H) 03/31/2018 0630   PHURINE 5.0 03/31/2018 0630   GLUCOSEU NEGATIVE 03/31/2018 0630   HGBUR SMALL (A) 03/31/2018 0630   BILIRUBINUR NEGATIVE 03/31/2018 0630   KETONESUR NEGATIVE 03/31/2018 0630   PROTEINUR NEGATIVE 03/31/2018 0630   NITRITE NEGATIVE 03/31/2018 0630   LEUKOCYTESUR NEGATIVE 03/31/2018 0630   Sepsis Labs: @LABRCNTIP (procalcitonin:4,lacticidven:4) ) Recent Results (from the past 240 hour(s))  Respiratory Panel by RT PCR (Flu A&B, Covid) - Nasopharyngeal Swab     Status: None    Collection Time: 11/20/19  3:47 PM   Specimen: Nasopharyngeal Swab  Result Value Ref Range Status   SARS Coronavirus 2 by RT PCR NEGATIVE NEGATIVE Final    Comment: (NOTE) SARS-CoV-2 target nucleic acids are NOT DETECTED.  The SARS-CoV-2 RNA is generally detectable in upper respiratoy specimens during the acute phase of infection. The lowest concentration of SARS-CoV-2 viral copies this assay can detect is 131 copies/mL. A negative result does not preclude  SARS-Cov-2 infection and should not be used as the sole basis for treatment or other patient management decisions. A negative result may occur with  improper specimen collection/handling, submission of specimen other than nasopharyngeal swab, presence of viral mutation(s) within the areas targeted by this assay, and inadequate number of viral copies (<131 copies/mL). A negative result must be combined with clinical observations, patient history, and epidemiological information. The expected result is Negative.  Fact Sheet for Patients:  PinkCheek.be  Fact Sheet for Healthcare Providers:  GravelBags.it  This test is no t yet approved or cleared by the [place] FDA and  has been authorized for detection and/or diagnosis of SARS-CoV-2 by FDA under an Emergency Use Authorization (EUA). This EUA will remain  in effect (meaning this test can be used) for the duration of the COVID-19 declaration under Section 564(b)(1) of the Act, 21 U.S.C. section 360bbb-3(b)(1), unless the authorization is terminated or revoked sooner.     Influenza A by PCR NEGATIVE NEGATIVE Final   Influenza B by PCR NEGATIVE NEGATIVE Final    Comment: (NOTE) The Xpert Xpress SARS-CoV-2/FLU/RSV assay is intended as an aid in  the diagnosis of influenza from Nasopharyngeal swab specimens and  should not be used as a sole basis for treatment. Nasal washings and  aspirates are unacceptable for Xpert  Xpress SARS-CoV-2/FLU/RSV  testing.  Fact Sheet for Patients: PinkCheek.be  Fact Sheet for Healthcare Providers: GravelBags.it  This test is not yet approved or cleared by the [place] FDA and  has been authorized for detection and/or diagnosis of SARS-CoV-2 by  FDA under an Emergency Use Authorization (EUA). This EUA will remain  in effect (meaning this test can be used) for the duration of the  Covid-19 declaration under Section 564(b)(1) of the Act, 21  U.S.C. section 360bbb-3(b)(1), unless the authorization is  terminated or revoked. Performed at [facility], [place] [address] [place],  [address]      Radiological Exams on Admission: DG Chest 2 View  Result Date: 11/20/2019 CLINICAL DATA:  Shortness of breath. EXAM: CHEST - 2 VIEW COMPARISON:  October 02, 2019. FINDINGS: Stable cardiomediastinal silhouette. Right lung is clear. Left upper lobe nodule or mass is enlarged compared to prior exam concerning for malignancy. Stable loculated left pleural effusion is noted with associated atelectasis or infiltrate. Bony thorax is unremarkable. IMPRESSION: Left upper lobe nodule or mass is enlarged compared to prior exam concerning for malignancy. Stable loculated left pleural effusion with associated atelectasis or infiltrate. Electronically Signed   By: Marijo Conception M.D.   On: 11/20/2019 15:55   CT Angio Chest PE W and/or Wo Contrast  Result Date: 11/20/2019 CLINICAL DATA:  Shortness of breath, productive cough. History of lung cancer. EXAM: CT ANGIOGRAPHY CHEST WITH CONTRAST TECHNIQUE: Multidetector CT imaging of the chest was performed using the standard protocol during bolus administration of intravenous contrast. Multiplanar CT image reconstructions and MIPs were obtained to evaluate the vascular anatomy. CONTRAST:  155mL OMNIPAQUE IOHEXOL 350 MG/ML SOLN COMPARISON:  September 28, 2019. FINDINGS:  Cardiovascular: Satisfactory opacification of the pulmonary arteries to the segmental level. No evidence of pulmonary embolism. Normal heart size. No pericardial effusion. Coronary artery calcifications are noted. Mediastinum/Nodes: No enlarged mediastinal, hilar, or axillary lymph nodes. Thyroid gland, trachea, and esophagus demonstrate no significant findings. Lungs/Pleura: No pneumothorax is noted. Emphysematous disease is noted in the upper lobes. Small right pleural effusion is noted with adjacent subsegmental atelectasis of the right lower lobe. Mild left posterior basilar  subsegmental atelectasis is noted. Grossly stable 3.5 x 2.7 cm mass is noted in left upper lobe consistent with malignancy and or post treatment change. Upper Abdomen: No acute abnormality. Musculoskeletal: No chest wall abnormality. No acute or significant osseous findings. Review of the MIP images confirms the above findings. IMPRESSION: 1. No definite evidence of pulmonary embolus. 2. Coronary artery calcifications are noted suggesting coronary artery disease. 3. Small right pleural effusion is noted with adjacent subsegmental atelectasis of the right lower lobe. 4. Grossly stable 3.5 x 2.7 cm mass is noted in left upper lobe consistent with malignancy and or post treatment change. 5. Emphysema and aortic atherosclerosis. Aortic Atherosclerosis (ICD10-I70.0) and Emphysema (ICD10-J43.9). Electronically Signed   By: Marijo Conception M.D.   On: 11/20/2019 18:58   VAS [place] LOWER EXTREMITY VENOUS (DVT) ([facility] and [facility] 7a-7p)  Result Date: 11/20/2019  Lower Venous DVTStudy Indications: Edema.  Risk Factors: None identified. Limitations: Body habitus and poor ultrasound/tissue interface. Comparison Study: No prior studies. Performing Technologist: Oliver Hum RVT  Examination Guidelines: A complete evaluation includes B-mode imaging, spectral Doppler, color Doppler, and power Doppler as needed of all accessible portions of each vessel.  Bilateral testing is considered an integral part of a complete examination. Limited examinations for reoccurring indications may be performed as noted. The reflux portion of the exam is performed with the patient in reverse Trendelenburg.  +---------+---------------+---------+-----------+----------+--------------+ RIGHT    CompressibilityPhasicitySpontaneityPropertiesThrombus Aging +---------+---------------+---------+-----------+----------+--------------+ CFV      Full           Yes      Yes                                 +---------+---------------+---------+-----------+----------+--------------+ SFJ      Full                                                        +---------+---------------+---------+-----------+----------+--------------+ FV Prox  Full                                                        +---------+---------------+---------+-----------+----------+--------------+ FV Mid                  Yes      Yes                                 +---------+---------------+---------+-----------+----------+--------------+ FV Distal               Yes      Yes                                 +---------+---------------+---------+-----------+----------+--------------+ PFV      Full                                                        +---------+---------------+---------+-----------+----------+--------------+  POP      Full           Yes      Yes                                 +---------+---------------+---------+-----------+----------+--------------+ PTV                                                   Not visualized +---------+---------------+---------+-----------+----------+--------------+ PERO                                                  Not visualized +---------+---------------+---------+-----------+----------+--------------+   +---------+---------------+---------+-----------+----------+--------------+ LEFT      CompressibilityPhasicitySpontaneityPropertiesThrombus Aging +---------+---------------+---------+-----------+----------+--------------+ CFV      Full           Yes      Yes                                 +---------+---------------+---------+-----------+----------+--------------+ SFJ      Full                                                        +---------+---------------+---------+-----------+----------+--------------+ FV Prox  Full                                                        +---------+---------------+---------+-----------+----------+--------------+ FV Mid   Full                                                        +---------+---------------+---------+-----------+----------+--------------+ FV Distal               Yes      Yes                                 +---------+---------------+---------+-----------+----------+--------------+ PFV      Full                                                        +---------+---------------+---------+-----------+----------+--------------+ POP      Full           Yes      Yes                                 +---------+---------------+---------+-----------+----------+--------------+ PTV      Full                                                        +---------+---------------+---------+-----------+----------+--------------+  PERO                                                  Not visualized +---------+---------------+---------+-----------+----------+--------------+     Summary: RIGHT: - There is no evidence of deep vein thrombosis in the lower extremity. However, portions of this examination were limited- see technologist comments above.  - No cystic structure found in the popliteal fossa.  LEFT: - There is no evidence of deep vein thrombosis in the lower extremity. However, portions of this examination were limited- see technologist comments above.  - No cystic structure found in the popliteal  fossa.  *See table(s) above for measurements and observations. Electronically signed by Deitra Mayo MD on 11/20/2019 at 8:14:37 PM.    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  May have be related to CHF with the elevated BNP.  He has been out of his oral Lasix.  Pneumonia and postobstructive symptoms from the lung cancer can also be responsible.  Patient will be admitted.  Initiate IV Lasix.  Continue with antibiotics and supportive care.  Work-up for CHF including echocardiogram.   #2 non-small cell lung cancer squamous cell: Patient has lost follow-up with oncology.  Last scheduled appointment was about a year ago.  I have discussed with patient that he will need to return back to Dr. Earlie Server.  May be a candidate for some type of treatment.  #3 alcohol abuse: I will initiate CIWA protocol.  Counseling provided.  #4 tobacco dependence: Nicotine patch and counseling.  DVT prophylaxis: Lovenox Code Status: Full code Family Communication: No family at bedside Disposition Plan: Home Consults called: None but may consult Dr. Earlie Server tomorrow Admission status: Inpatient  Severity of Illness: The appropriate patient status for this patient is INPATIENT. Inpatient status is judged to be reasonable and necessary in order to provide the required intensity of service to ensure the patient's safety. The patient's presenting symptoms, physical exam findings, and initial radiographic and laboratory data in the context of their chronic comorbidities is felt to place them at high risk for further clinical deterioration. Furthermore, it is not anticipated that the patient will be  medically stable for discharge from the hospital within 2 midnights of admission. The following factors support the patient status of inpatient.   " The patient's presenting symptoms include shortness of breath. " The worrisome physical exam findings include bilateral lower extremity edema with basilar crackles. " The initial radiographic and laboratory data are worrisome because of CT findings of bilateral findings. " The chronic co-morbidities include lung cancer.   * I certify that at the point of admission it is my clinical judgment that the patient will require inpatient hospital care spanning beyond 2 midnights from the point of admission due to high intensity of service, high risk for further deterioration and high frequency of surveillance required.Barbette Merino MD [facility] Pager [phone]  If 7PM-7AM, please contact night-coverage [email or web address] Password [facility]  11/20/2019, 9:39 PM

## 2019-11-20 NOTE — ED Notes (Signed)
Patient ambulated to bathroom with cane, O2 sats dropped to the mid 70s. Escorted patient back to bed, placed [place] back on patient, O2 sats rose back to 90s.

## 2019-11-20 NOTE — Progress Notes (Signed)
Bilateral lower extremity venous duplex has been completed. Preliminary results can be found in CV Proc through chart review.  Results were given to Dr. Alvino Chapel.  11/20/19 4:03 PM Carlos Levering RVT

## 2019-11-20 NOTE — ED Provider Notes (Signed)
Tyler Mcmill[facility] DEPT Provider Note   CSN: [id number] Arrival date & time: 11/20/19  1455     History Chief Complaint  Patient presents with  . Shortness of Breath  . Leg Swelling    Tyler Mcmillan is a 74 y.o. male.     Patient with multiple medical issues including AAA, lung cancer now presents with dyspnea, lower extremity edema.  Symptoms have been present for about 10 days, worsening, to the point where he is incapable of performing ADLs, ambulating without any dyspnea. He has had increasing severe bilateral lower extremity edema throughout this period. Patient notes that he has had previous similar episodes, requiring hospitalization, IV Lasix. Patient is not taking any oral Lasix currently. He denies chest pain, abdominal pain, fever, nausea, vomiting.   Past Medical History:  Diagnosis Date  . AAA (abdominal aortic aneurysm) without rupture ([place])    [place] ABD.  DONE  JUNE 2014  3.5  . Bladder cancer ([place])   . Chronic anxiety   . Chronic pain   . GERD (gastroesophageal reflux disease)   . Hypertension   . 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 Acute on chronic respiratory failure with hypoxemia ([place]) 11/20/2019  . Suicide attempt by substance overdose ([place]) 10/07/2019  . Hypokalemia 10/02/2019  . Other cirrhosis of liver ([place]) 09/30/2019  . Aspiration pneumonia ([place]) 09/28/2019  . Acute respiratory failure with hypoxia ([place]) 09/27/2019  . Intentional overdose of drug in tablet form ([place]) 09/27/2019  . Alcohol abuse 09/27/2019  . Tobacco dependence 09/27/2019  . Acute on chronic respiratory failure with hypoxia ([place]) 04/06/2018  . Malnutrition of moderate degree 04/05/2018  . Pressure injury of skin 04/05/2018  . GI bleed 03/31/2018  . Acute post-hemorrhagic anemia   . Malignant neoplasm metastatic to left lung ([place])   . Peripheral edema   .  Pleural effusion on left   . Pleural effusion, left   . Fall at home, initial encounter   . Generalized weakness   . Acute GI bleeding 03/30/2018  . Malignant neoplasm of bronchus of left upper lobe ([place]) 08/09/2016  . Hypertensive heart disease without heart failure   . Chest pain 07/02/2016  . Cavitary lesion of lung 07/02/2016  . Unstable angina ([place])   . AAA (abdominal aortic aneurysm) ([place]) 07/09/2014  . Lumbar degenerative disc disease 07/09/2014    Past Surgical History:  Procedure Laterality Date  . ABDOMINAL AORTIC ENDOVASCULAR STENT GRAFT N/A 09/08/2016   Procedure: ABDOMINAL AORTIC ENDOVASCULAR STENT GRAFT;  Surgeon: [name], MD;  Location: [place];  Service: Vascular;  Laterality: N/A;  . BIOPSY  03/31/2018   Procedure: BIOPSY;  Surgeon: Wonda Horner, MD;  Location: [facility] ENDOSCOPY;  Service: Endoscopy;;  . CHEST TUBE INSERTION Left 04/07/2018   Procedure: INSERTION PLEURAL DRAINAGE CATHETER USING ULTRASOUND AND FLURO GUIDANCE;  Surgeon: Grace Isaac, MD;  Location: [place];  Service: Thoracic;  Laterality: Left;  . CYSTOSCOPY W/ URETERAL STENT PLACEMENT Left 10/04/2012   Procedure: CYSTOSCOPY WITH RETROGRADE PYELOGRAM/URETERAL STENT PLACEMENT   "POSSIBLE LEFT STENT";  Surgeon: Alexis Frock, MD;  Location: [facility];  Service: Urology;  Laterality: Left;  . ESOPHAGOGASTRODUODENOSCOPY (EGD) WITH PROPOFOL N/A 03/31/2018   Procedure: ESOPHAGOGASTRODUODENOSCOPY (EGD) WITH PROPOFOL;  Surgeon: Wonda Horner, MD;  Location[facility]COPY;  Service: Endoscopy;  Laterality: N/A;  . IR THORACENTESIS ASP PLEURAL SPACE W/IMG  GUIDE  03/31/2018  . IR THORACENTESIS ASP PLEURAL SPACE W/IMG GUIDE  04/03/2018  . IR THORACENTESIS ASP PLEURAL SPACE W/IMG GUIDE  04/04/2018  . LEFT HEART CATH AND CORONARY ANGIOGRAPHY N/A 07/02/2016   Procedure: Left Heart Cath and Coronary Angiography;  Surgeon: Martinique, Peter M, MD;  Location: [place] CV LAB;  Service: Cardiovascular;   Laterality: N/A;  . MEATOTOMY N/A 10/04/2012   Procedure: MEATOTOMY ADULT;  Surgeon: Alexis Frock, MD;  Location[facility];  Service: Urology;  Laterality: N/A;  . REMOVAL OF PLEURAL DRAINAGE CATHETER Left 05/24/2018   Procedure: REMOVAL OF PLEURAL DRAINAGE CATHETER;  Surgeon: Grace Isaac, MD;  Location: [place];  Service: Thoracic;  Laterality: Left;  . TRANSURETHRAL RESECTION OF BLADDER TUMOR WITH GYRUS (TURBT-GYRUS) N/A 10/04/2012   Procedure: TRANSURETHRAL RESECTION OF BLADDER TUMOR WITH GYRUS (TURBT-GYRUS);  Surgeon: Alexis Frock, MD;  Location: [facility];  Service: Urology;  Laterality: N/A;  . VIDEO BRONCHOSCOPY Bilateral 07/19/2016   Procedure: VIDEO BRONCHOSCOPY WITH FLUORO;  Surgeon: Marshell Garfinkel, MD;  Location: [facility] ENDOSCOPY;  Service: Cardiopulmonary;  Laterality: Bilateral;       Family History  Problem Relation Age of Onset  . Cancer Mother   . Heart disease Father        before age 84  . Heart attack Father   . Cancer Brother     Social History   Tobacco Use  . Smoking status: Current Every Day Smoker    Packs/day: 0.50    Years: 50.00    Pack years: 25.00    Types: Cigarettes  . Smokeless tobacco: Never Used  . Tobacco comment: pt refuses info  Vaping Use  . Vaping Use: Never used  Substance Use Topics  . Alcohol use: Yes    Alcohol/week: 24.0 standard drinks    Types: 10 Glasses of wine, 14 Cans of beer per week    Comment: few glases wine per day, beer  . Drug use: No    Home Medications Prior to Admission medications   Medication Sig Start Date End Date Taking? Authorizing Provider  clonazePAM (KLONOPIN) 1 MG tablet Take 1 tablet (1 mg total) by mouth at bedtime as needed for anxiety. 10/13/19  Yes Debbe Odea, MD  furosemide (LASIX) 20 MG tablet Take 1 tablet (20 mg total) by mouth daily. 10/13/19  Yes Debbe Odea, MD  Oxycodone HCl 20 MG TABS Take 20 mg by mouth every 4 (four) hours.  09/11/19  Yes [provider]  pantoprazole (PROTONIX) 40 MG tablet Take 1 tablet (40 mg total) by mouth 2 (two) times daily. 04/08/18  Yes Charlynne Cousins, MD  senna (SENOKOT) 8.6 MG tablet Take 1 tablet by mouth daily.   Yes [provider]  amLODipine (NORVASC) 10 MG tablet Take 1 tablet (10 mg total) by mouth daily. Patient not taking: Reported on 11/20/2019 10/13/19   Debbe Odea, MD  docusate sodium (COLACE) 100 MG capsule Take 1 capsule (100 mg total) by mouth 2 (two) times daily. Patient not taking: Reported on 11/20/2019 10/07/19   Charlynne Cousins, MD  oxyCODONE (ROXICODONE) 15 MG immediate release tablet Take 1 tablet (15 mg total) by mouth every 4 (four) hours as needed for severe pain. Patient not taking: Reported on 11/20/2019 10/13/19   Debbe Odea, MD    Allergies    Patient has no known allergies.  Review of Systems   Review of Systems  Constitutional:       Per  HPI, otherwise negative  HENT:       Per HPI, otherwise negative  Respiratory:       Per HPI, otherwise negative  Cardiovascular:       Per HPI, otherwise negative  Gastrointestinal: Negative for vomiting.  Endocrine:       Negative aside from HPI  Genitourinary:       Neg aside from HPI   Musculoskeletal:       Per HPI, otherwise negative  Skin: Negative.   Allergic/Immunologic: Positive for immunocompromised state.  Neurological: Positive for weakness. Negative for syncope.    Physical Exam Updated Vital Signs BP (!) 169/93   Pulse 86   Temp 98.2 F (36.8 C) (Oral)   Resp 20   SpO2 96%   Physical Exam Vitals and nursing note reviewed.  Constitutional:      General: He is not in acute distress.    Appearance: He is obese. He is ill-appearing.  HENT:     Head: Normocephalic and atraumatic.  Eyes:     Conjunctiva/sclera: Conjunctivae normal.  Cardiovascular:     Rate and Rhythm: Normal rate and regular rhythm.  Pulmonary:     Effort: Pulmonary effort is normal.     Breath sounds:  Decreased breath sounds and wheezing present.  Abdominal:     General: There is no distension.  Musculoskeletal:     Right lower leg: Edema present.     Left lower leg: Edema present.  Skin:    General: Skin is warm and dry.  Neurological:     Mental Status: He is alert and oriented to person, place, and time.      ED Results / Procedures / Treatments   Labs (all labs ordered are listed, but only abnormal results are displayed) Labs Reviewed  CBC WITH DIFFERENTIAL/PLATELET - Abnormal; Notable for the following components:      Result Value   RBC 3.98 (*)    Hemoglobin 9.7 (*)    HCT 34.0 (*)    MCH 24.4 (*)    MCHC 28.5 (*)    RDW 20.4 (*)    Lymphs Abs 0.4 (*)    All other components within normal limits  COMPREHENSIVE METABOLIC PANEL - Abnormal; Notable for the following components:   Glucose, Bld 118 (*)    Calcium 8.4 (*)    Albumin 3.4 (*)    All other components within normal limits  D-DIMER, QUANTITATIVE (NOT AT [facility]) - Abnormal; Notable for the following components:   D-Dimer, Quant 1.80 (*)    All other components within normal limits  FERRITIN - Abnormal; Notable for the following components:   Ferritin 13 (*)    All other components within normal limits  C-REACTIVE PROTEIN - Abnormal; Notable for the following components:   CRP 1.3 (*)    All other components within normal limits  BRAIN NATRIURETIC PEPTIDE - Abnormal; Notable for the following components:   B Natriuretic Peptide 1,251.9 (*)    All other components within normal limits  RESPIRATORY PANEL BY RT PCR (FLU A&B, COVID)  CULTURE, BLOOD (ROUTINE X 2)  CULTURE, BLOOD (ROUTINE X 2)  LACTIC ACID, PLASMA  PROCALCITONIN  LACTATE DEHYDROGENASE  TRIGLYCERIDES  FIBRINOGEN    EKG EKG Interpretation  Date/Time:  Tuesday November 20 2019 15:22:09 EDT Ventricular Rate:  96 PR Interval:    QRS Duration: 100 QT Interval:  373 QTC Calculation: 472 R Axis:   75 Text Interpretation: Sinus rhythm  Borderline T abnormalities, anterior leads Baseline  wander Artifact Abnormal ECG Confirmed by Carmin Muskrat [phone]) on 11/20/2019 3:34:10 PM   Radiology DG Chest 2 View  Result Date: 11/20/2019 CLINICAL DATA:  Shortness of breath. EXAM: CHEST - 2 VIEW COMPARISON:  October 02, 2019. FINDINGS: Stable cardiomediastinal silhouette. Right lung is clear. Left upper lobe nodule or mass is enlarged compared to prior exam concerning for malignancy. Stable loculated left pleural effusion is noted with associated atelectasis or infiltrate. Bony thorax is unremarkable. IMPRESSION: Left upper lobe nodule or mass is enlarged compared to prior exam concerning for malignancy. Stable loculated left pleural effusion with associated atelectasis or infiltrate. Electronically Signed   By: Marijo Conception M.D.   On: 11/20/2019 15:55   CT Angio Chest PE W and/or Wo Contrast  Result Date: 11/20/2019 CLINICAL DATA:  Shortness of breath, productive cough. History of lung cancer. EXAM: CT ANGIOGRAPHY CHEST WITH CONTRAST TECHNIQUE: Multidetector CT imaging of the chest was performed using the standard protocol during bolus administration of intravenous contrast. Multiplanar CT image reconstructions and MIPs were obtained to evaluate the vascular anatomy. CONTRAST:  135mL OMNIPAQUE IOHEXOL 350 MG/ML SOLN COMPARISON:  September 28, 2019. FINDINGS: Cardiovascular: Satisfactory opacification of the pulmonary arteries to the segmental level. No evidence of pulmonary embolism. Normal heart size. No pericardial effusion. Coronary artery calcifications are noted. Mediastinum/Nodes: No enlarged mediastinal, hilar, or axillary lymph nodes. Thyroid gland, trachea, and esophagus demonstrate no significant findings. Lungs/Pleura: No pneumothorax is noted. Emphysematous disease is noted in the upper lobes. Small right pleural effusion is noted with adjacent subsegmental atelectasis of the right lower lobe. Mild left posterior basilar subsegmental  atelectasis is noted. Grossly stable 3.5 x 2.7 cm mass is noted in left upper lobe consistent with malignancy and or post treatment change. Upper Abdomen: No acute abnormality. Musculoskeletal: No chest wall abnormality. No acute or significant osseous findings. Review of the MIP images confirms the above findings. IMPRESSION: 1. No definite evidence of pulmonary embolus. 2. Coronary artery calcifications are noted suggesting coronary artery disease. 3. Small right pleural effusion is noted with adjacent subsegmental atelectasis of the right lower lobe. 4. Grossly stable 3.5 x 2.7 cm mass is noted in left upper lobe consistent with malignancy and or post treatment change. 5. Emphysema and aortic atherosclerosis. Aortic Atherosclerosis (ICD10-I70.0) and Emphysema (ICD10-J43.9). Electronically Signed   By: Marijo Conception M.D.   On: 11/20/2019 18:58   VAS [place] LOWER EXTREMITY VENOUS (DVT) ([facility] 7a-7p)  Result Date: 11/20/2019  Lower Venous DVTStudy Indications: Edema.  Risk Factors: None identified. Limitations: Body habitus and poor ultrasound/tissue interface. Comparison Study: No prior studies. Performing Technologist: Oliver Hum RVT  Examination Guidelines: A complete evaluation includes B-mode imaging, spectral Doppler, color Doppler, and power Doppler as needed of all accessible portions of each vessel. Bilateral testing is considered an integral part of a complete examination. Limited examinations for reoccurring indications may be performed as noted. The reflux portion of the exam is performed with the patient in reverse Trendelenburg.  +---------+---------------+---------+-----------+----------+--------------+ RIGHT    CompressibilityPhasicitySpontaneityPropertiesThrombus Aging +---------+---------------+---------+-----------+----------+--------------+ CFV      Full           Yes      Yes                                  +---------+---------------+---------+-----------+----------+--------------+ SFJ      Full                                                        +---------+---------------+---------+-----------+----------+--------------+  FV Prox  Full                                                        +---------+---------------+---------+-----------+----------+--------------+ FV Mid                  Yes      Yes                                 +---------+---------------+---------+-----------+----------+--------------+ FV Distal               Yes      Yes                                 +---------+---------------+---------+-----------+----------+--------------+ PFV      Full                                                        +---------+---------------+---------+-----------+----------+--------------+ POP      Full           Yes      Yes                                 +---------+---------------+---------+-----------+----------+--------------+ PTV                                                   Not visualized +---------+---------------+---------+-----------+----------+--------------+ PERO                                                  Not visualized +---------+---------------+---------+-----------+----------+--------------+   +---------+---------------+---------+-----------+----------+--------------+ LEFT     CompressibilityPhasicitySpontaneityPropertiesThrombus Aging +---------+---------------+---------+-----------+----------+--------------+ CFV      Full           Yes      Yes                                 +---------+---------------+---------+-----------+----------+--------------+ SFJ      Full                                                        +---------+---------------+---------+-----------+----------+--------------+ FV Prox  Full                                                         +---------+---------------+---------+-----------+----------+--------------+ FV Mid   Full                                                        +---------+---------------+---------+-----------+----------+--------------+  FV Distal               Yes      Yes                                 +---------+---------------+---------+-----------+----------+--------------+ PFV      Full                                                        +---------+---------------+---------+-----------+----------+--------------+ POP      Full           Yes      Yes                                 +---------+---------------+---------+-----------+----------+--------------+ PTV      Full                                                        +---------+---------------+---------+-----------+----------+--------------+ PERO                                                  Not visualized +---------+---------------+---------+-----------+----------+--------------+     Summary: RIGHT: - There is no evidence of deep vein thrombosis in the lower extremity. However, portions of this examination were limited- see technologist comments above.  - No cystic structure found in the popliteal fossa.  LEFT: - There is no evidence of deep vein thrombosis in the lower extremity. However, portions of this examination were limited- see technologist comments above.  - No cystic structure found in the popliteal fossa.  *See table(s) above for measurements and observations. Electronically signed by Deitra Mayo MD on 11/20/2019 at 8:14:37 PM.    Final     Procedures Procedures (including critical care time)  Medications Ordered in ED Medications  iohexol (OMNIPAQUE) 350 MG/ML injection 100 mL (100 mLs Intravenous Contrast Given 11/20/19 1836)  furosemide (LASIX) injection 40 mg (40 mg Intravenous Given 11/20/19 1915)    ED Course  I have reviewed the triage vital signs and the nursing notes.  Pertinent  labs & imaging results that were available during my care of the patient were reviewed by me and considered in my medical decision making (see chart for details).  On repeat exam the patient is joined by family member.  She notes that the patient has been progressively weaker.  Patient corroborates that he may have been missing some of his medication, but has been dyspneic prior to that occurring. Family member notes that the patient is no longer a candidate for additional therapy for his known a lung tumor.  Update:, Remaining labs notable for substantial elevated BNP.  CT angiography performed, notable for no PE, but for redemonstration of left sided lung mass, with pleural effusion. Patient continues to require oxygen intermittently, and with attempted ambulation. He has received IV Lasix per Given his commendation of worsening heart failure, tumor burden, patient will require  admission for further monitoring, management.  MDM Number of Diagnoses or Management Options Acute on chronic respiratory failure with hypoxia ([place]): new, needed workup   Amount and/or Complexity of Data Reviewed Clinical lab tests: reviewed Tests in the radiology section of CPT: reviewed Tests in the medicine section of CPT: reviewed Decide to obtain previous medical records or to obtain history from someone other than the patient: yes Obtain history from someone other than the patient: yes Review and summarize past medical records: yes Discuss the patient with other providers: yes Independent visualization of images, tracings, or specimens: yes  Risk of Complications, Morbidity, and/or Mortality Presenting problems: high Diagnostic procedures: high Management options: high  Critical Care Total time providing critical care: 30-74 minutes (35)  Patient Progress Patient progress: stable  Final Clinical Impression(s) / ED Diagnoses Final diagnoses:  Acute on chronic respiratory failure with hypoxia ([place])      Carmin Muskrat, MD 11/20/19 2327

## 2019-11-20 NOTE — ED Triage Notes (Addendum)
Pt presents with c/o shortness of breath and leg swelling. Pt reports the swelling has been present for some time but has been getting worse over the last couple of months. Pt also reports some shortness of breath, reports he has lung cancer.

## 2019-11-21 ENCOUNTER — Inpatient Hospital Stay (HOSPITAL_COMMUNITY): Payer: Medicare Other

## 2019-11-21 ENCOUNTER — Other Ambulatory Visit: Payer: Self-pay

## 2019-11-21 DIAGNOSIS — I5031 Acute diastolic (congestive) heart failure: Secondary | ICD-10-CM

## 2019-11-21 DIAGNOSIS — F1010 Alcohol abuse, uncomplicated: Secondary | ICD-10-CM

## 2019-11-21 DIAGNOSIS — C3412 Malignant neoplasm of upper lobe, left bronchus or lung: Secondary | ICD-10-CM

## 2019-11-21 DIAGNOSIS — J9621 Acute and chronic respiratory failure with hypoxia: Secondary | ICD-10-CM | POA: Diagnosis not present

## 2019-11-21 LAB — BASIC METABOLIC PANEL
Anion gap: 10 (ref 5–15)
BUN: 16 mg/dL (ref 8–23)
CO2: 31 mmol/L (ref 22–32)
Calcium: 8.2 mg/dL — ABNORMAL LOW (ref 8.9–10.3)
Chloride: 98 mmol/L (ref 98–111)
Creatinine, Ser: 0.79 mg/dL (ref 0.61–1.24)
GFR, Estimated: >60 mL/min (ref >60)
Glucose, Bld: 95 mg/dL (ref 70–99)
Potassium: 3.6 mmol/L (ref 3.5–5.1)
Sodium: 139 mmol/L (ref 135–145)

## 2019-11-21 LAB — CBC
HCT: 32.6 % — ABNORMAL LOW (ref 39.0–52.0)
Hemoglobin: 9.2 g/dL — ABNORMAL LOW (ref 13.0–17.0)
MCH: 24.1 pg — ABNORMAL LOW (ref 26.0–34.0)
MCHC: 28.2 g/dL — ABNORMAL LOW (ref 30.0–36.0)
MCV: 85.6 fL (ref 80.0–100.0)
Platelets: 265 10*3/uL (ref 150–400)
RBC: 3.81 MIL/uL — ABNORMAL LOW (ref 4.22–5.81)
RDW: 20.2 % — ABNORMAL HIGH (ref 11.5–15.5)
WBC: 10 10*3/uL (ref 4.0–10.5)
nRBC: 0 % (ref 0.0–0.2)

## 2019-11-21 LAB — CBC WITH DIFFERENTIAL/PLATELET
Abs Immature Granulocytes: 0.04 10*3/uL (ref 0.00–0.07)
Basophils Absolute: 0 10*3/uL (ref 0.0–0.1)
Basophils Relative: 0 %
Eosinophils Absolute: 0 10*3/uL (ref 0.0–0.5)
Eosinophils Relative: 0 %
HCT: 32.3 % — ABNORMAL LOW (ref 39.0–52.0)
Hemoglobin: 9 g/dL — ABNORMAL LOW (ref 13.0–17.0)
Immature Granulocytes: 0 %
Lymphocytes Relative: 4 %
Lymphs Abs: 0.4 10*3/uL — ABNORMAL LOW (ref 0.7–4.0)
MCH: 24.1 pg — ABNORMAL LOW (ref 26.0–34.0)
MCHC: 27.9 g/dL — ABNORMAL LOW (ref 30.0–36.0)
MCV: 86.6 fL (ref 80.0–100.0)
Monocytes Absolute: 1 10*3/uL (ref 0.1–1.0)
Monocytes Relative: 11 %
Neutro Abs: 7.6 10*3/uL (ref 1.7–7.7)
Neutrophils Relative %: 85 %
Platelets: 262 10*3/uL (ref 150–400)
RBC: 3.73 MIL/uL — ABNORMAL LOW (ref 4.22–5.81)
RDW: 20.1 % — ABNORMAL HIGH (ref 11.5–15.5)
WBC: 9 10*3/uL (ref 4.0–10.5)
nRBC: 0.2 % (ref 0.0–0.2)

## 2019-11-21 LAB — ECHOCARDIOGRAM COMPLETE
Area-P 1/2: 3.72 cm2
S' Lateral: 2.5 cm

## 2019-11-21 LAB — BLOOD GAS, ARTERIAL
Acid-Base Excess: 6.1 mmol/L — ABNORMAL HIGH (ref 0.0–2.0)
Bicarbonate: 33.3 mmol/L — ABNORMAL HIGH (ref 20.0–28.0)
O2 Content: 4 L/min
O2 Saturation: 94.2 %
Patient temperature: 98.6
pCO2 arterial: 69.5 mmHg (ref 32.0–48.0)
pH, Arterial: 7.302 — ABNORMAL LOW (ref 7.350–7.450)
pO2, Arterial: 77.9 mmHg — ABNORMAL LOW (ref 83.0–108.0)

## 2019-11-21 LAB — MAGNESIUM: Magnesium: 2 mg/dL (ref 1.7–2.4)

## 2019-11-21 LAB — COMPREHENSIVE METABOLIC PANEL
ALT: 12 U/L (ref 0–44)
AST: 22 U/L (ref 15–41)
Albumin: 3.2 g/dL — ABNORMAL LOW (ref 3.5–5.0)
Alkaline Phosphatase: 67 U/L (ref 38–126)
Anion gap: 9 (ref 5–15)
BUN: 17 mg/dL (ref 8–23)
CO2: 32 mmol/L (ref 22–32)
Calcium: 8.1 mg/dL — ABNORMAL LOW (ref 8.9–10.3)
Chloride: 97 mmol/L — ABNORMAL LOW (ref 98–111)
Creatinine, Ser: 0.77 mg/dL (ref 0.61–1.24)
GFR, Estimated: >60 mL/min (ref >60)
Glucose, Bld: 106 mg/dL — ABNORMAL HIGH (ref 70–99)
Potassium: 3.9 mmol/L (ref 3.5–5.1)
Sodium: 138 mmol/L (ref 135–145)
Total Bilirubin: 0.6 mg/dL (ref 0.3–1.2)
Total Protein: 7.1 g/dL (ref 6.5–8.1)

## 2019-11-21 LAB — ETHANOL: Alcohol, Ethyl (B): <10 mg/dL (ref <10)

## 2019-11-21 LAB — PHOSPHORUS: Phosphorus: 3 mg/dL (ref 2.5–4.6)

## 2019-11-21 MED ORDER — PANTOPRAZOLE SODIUM 40 MG PO TBEC
40.0000 mg | DELAYED_RELEASE_TABLET | Freq: Two times a day (BID) | ORAL | Status: DC
  Administered 2019-11-21 – 2019-11-30 (×20): 40 mg via ORAL
  Filled 2019-11-21 (×20): qty 1

## 2019-11-21 MED ORDER — GUAIFENESIN ER 600 MG PO TB12
600.0000 mg | ORAL_TABLET | Freq: Two times a day (BID) | ORAL | Status: DC
  Administered 2019-11-21 – 2019-11-30 (×18): 600 mg via ORAL
  Filled 2019-11-21 (×19): qty 1

## 2019-11-21 MED ORDER — SODIUM CHLORIDE 0.9% FLUSH: 3.0000 mL | INTRAVENOUS | Status: DC | PRN

## 2019-11-21 MED ORDER — OXYCODONE HCL 5 MG PO TABS
20.0000 mg | ORAL_TABLET | ORAL | Status: DC
  Administered 2019-11-21 – 2019-11-30 (×55): 20 mg via ORAL
  Filled 2019-11-21 (×56): qty 4

## 2019-11-21 MED ORDER — SODIUM CHLORIDE 0.9% FLUSH
3.0000 mL | Freq: Two times a day (BID) | INTRAVENOUS | Status: DC
  Administered 2019-11-21 – 2019-11-29 (×15): 3 mL via INTRAVENOUS

## 2019-11-21 MED ORDER — SODIUM CHLORIDE 0.9 % IV SOLN: 250.0000 mL | INTRAVENOUS | Status: DC | PRN

## 2019-11-21 MED ORDER — SENNA 8.6 MG PO TABS
1.0000 | ORAL_TABLET | Freq: Every day | ORAL | Status: DC
  Administered 2019-11-21 – 2019-11-29 (×7): 8.6 mg via ORAL
  Filled 2019-11-21 (×8): qty 1

## 2019-11-21 MED ORDER — ACETAMINOPHEN 325 MG PO TABS
650.0000 mg | ORAL_TABLET | ORAL | Status: DC | PRN
  Administered 2019-11-24 – 2019-11-30 (×11): 650 mg via ORAL
  Filled 2019-11-21 (×10): qty 2

## 2019-11-21 MED ORDER — FUROSEMIDE 10 MG/ML IJ SOLN
40.0000 mg | Freq: Two times a day (BID) | INTRAMUSCULAR | Status: DC
  Administered 2019-11-21 – 2019-11-23 (×5): 40 mg via INTRAVENOUS
  Filled 2019-11-21 (×5): qty 4

## 2019-11-21 MED ORDER — CHLORHEXIDINE GLUCONATE CLOTH 2 % EX PADS
6.0000 | MEDICATED_PAD | Freq: Every day | CUTANEOUS | Status: DC
  Administered 2019-11-21 – 2019-11-27 (×7): 6 via TOPICAL

## 2019-11-21 MED ORDER — ENOXAPARIN SODIUM 40 MG/0.4ML ~~LOC~~ SOLN
40.0000 mg | SUBCUTANEOUS | Status: DC
  Administered 2019-11-21 – 2019-11-29 (×9): 40 mg via SUBCUTANEOUS
  Filled 2019-11-21 (×9): qty 0.4

## 2019-11-21 MED ORDER — ONDANSETRON HCL 4 MG/2ML IJ SOLN: 4.0000 mg | Freq: Four times a day (QID) | INTRAMUSCULAR | Status: DC | PRN

## 2019-11-21 MED ORDER — CLONAZEPAM 1 MG PO TABS
1.0000 mg | ORAL_TABLET | Freq: Three times a day (TID) | ORAL | Status: DC | PRN
  Administered 2019-11-21 – 2019-11-28 (×9): 1 mg via ORAL
  Filled 2019-11-21: qty 2
  Filled 2019-11-21 (×9): qty 1

## 2019-11-21 NOTE — ED Notes (Signed)
Date and time results received: 11/21/19 1057 (use smartphrase ".now" to insert current time)  Test: PCo2 Critical Value: 69.5  Name of Provider Notified: Eleonore Chiquito, MD  Orders Received? Or Actions Taken?: Provider notified

## 2019-11-21 NOTE — ED Notes (Signed)
Pt had incontinent episode. Complete bed linen change was performed. Condom cath has been attempted twice with no success. Anatomy does not allow condom cath. Concerned for pt skin breakdown.

## 2019-11-21 NOTE — Progress Notes (Signed)
[facility]  PROGRESS NOTE  Tyler Mcmillan UKG:[id number] DOB: September 30, 1945 DOA: 11/20/2019 PCP: [name], MD   Brief HPI:   74 year old male with history of bladder cancer, abdominal attic and rhythm, chronic anxiety, alcoholism, GERD, hypertension, squamous cell carcinoma of the left lung was diagnosed previously in 2018.  Followed by Dr. [name], Dr. [name], Dr. [name].  He had stereotactic radiation therapy on 08/25/2017.  Patient appears to have lost follow-up since last year.  Came to ED with complaints of shortness of breath cough and feeling terrible.  BNP was elevated, CT and x-ray chest did not show overt CHF.  CTA chest showed small right pleural effusion with 3.5 cm x 2.7 cm mass in the left upper lobe, no evidence of PE.    Subjective   Patient seen and examined, he became dyspneic on exertion today briefly requiring 6 L/min of oxygen via nasal cannula, it has been weaned down back to 4 L/min.  Denies any shortness of breath at this time.   Assessment/Plan:     1. Acute on chronic hypoxemic respiratory failure-patient requiring 5 L/min of oxygen via nasal cannula.  Likely from underlying diastolic CHF.  BNP is elevated to 1200.  Patient ran out of oral Lasix.  Patient also has pneumonia and postobstructive symptoms from lung cancer.  Patient started on Lasix 40 mg IV every 12 hours.  ABG done this morning showed PCO2 69.5, pH 7.302, PO2 77.9. 2. Acute on chronic diastolic CHF-echocardiogram done today shows grade 2 diastolic dysfunction, EF 76%.  Started on Lasix as above.  BNP is elevated at 1200.  Foley catheter has been inserted as patient unable to get up to urinate.  Condom catheter could not be placed due to patient's anatomy. 3. Alcohol abuse-continue CIWA protocol.     COVID-19 Labs  Recent Labs    11/20/19 1547  DDIMER 1.80*  FERRITIN 13*  LDH 145  CRP 1.3*    Lab Results  Component Value Date   SARSCOV2NAA NEGATIVE 11/20/2019    [place] NEGATIVE 10/06/2019   [place] NEGATIVE 09/27/2019     Scheduled medications:    enoxaparin (LOVENOX) injection  40 mg Subcutaneous E83T   folic acid  1 mg Oral Daily   furosemide  40 mg Intravenous BID   guaiFENesin  600 mg Oral BID   multivitamin with minerals  1 tablet Oral Daily   oxyCODONE  20 mg Oral Q4H   pantoprazole  40 mg Oral BID   senna  1 tablet Oral Daily   sodium chloride flush  3 mL Intravenous Q12H   thiamine  100 mg Oral Daily   Or   thiamine  100 mg Intravenous Daily         CBG: No results for input(s): GLUCAP in the last 168 hours.  SpO2: 97 % O2 Flow Rate (L/min): 4 L/min (Weaned to 3 L)    CBC: Recent Labs  Lab 11/20/19 1547 11/21/19 0000 11/21/19 0527  WBC 8.3 10.0 9.0  NEUTROABS 7.1  --  7.6  HGB 9.7* 9.2* 9.0*  HCT 34.0* 32.6* 32.3*  MCV 85.4 85.6 86.6  PLT 300 265 517    Basic Metabolic Panel: Recent Labs  Lab 11/20/19 1547 11/21/19 0000 11/21/19 0527  NA 137 138 139  K 4.1 3.9 3.6  CL 98 97* 98  CO2 30 32 31  GLUCOSE 118* 106* 95  BUN 16 17 16   CREATININE 0.83 0.77 0.79  CALCIUM 8.4* 8.1* 8.2*  MG  --  2.0  --   PHOS  --  3.0  --      Liver Function Tests: Recent Labs  Lab 11/20/19 1547 11/21/19 0000  AST 22 22  ALT 12 12  ALKPHOS 68 67  BILITOT 0.4 0.6  PROT 7.6 7.1  ALBUMIN 3.4* 3.2*     Antibiotics: Anti-infectives (From admission, onward)   None       DVT prophylaxis: Lovenox  Code Status: Full code  Family Communication: No family at bedside    Status is: Inpatient  Dispo: The patient is from: Home              Anticipated d/c is to: Home              Anticipated d/c date is: 11/23/2019              Patient currently not medically stable for discharge  Barrier to discharge-ongoing treatment for acute on chronic hypoxemic respiratory failure  Pressure Injury 03/30/18 Stage I -  Intact skin with non-blanchable redness of a localized area usually over a bony  prominence. (Active)  03/30/18 2000  Location: Sacrum  Location Orientation:   Staging: Stage I -  Intact skin with non-blanchable redness of a localized area usually over a bony prominence.  Wound Description (Comments):   Present on Admission: Yes         Consultants:    Procedures:     Objective   Vitals:   11/21/19 1552 11/21/19 1600 11/21/19 1630 11/21/19 1729  BP: (!) 149/67 (!) 145/62 (!) 144/65 (!) 162/76  Pulse: 83 79 71 85  Resp: (!) 22 18 19 19   Temp:    98.3 F (36.8 C)  TempSrc:    Oral  SpO2: 93% 93% 94% 97%    Physical Examination:    General: Appears in no acute distress  Cardiovascular: S1-S2, regular, no murmur auscultated  Respiratory: Decreased breath sounds bilaterally at lung bases  Abdomen: Abdomen is soft, nontender, no organomegaly  Extremities: No edema in the lower extremities  Neurologic: Alert, oriented x 3, intact insight and judgment    Data Reviewed:   Recent Results (from the past 240 hour(s))  Respiratory Panel by RT PCR (Flu A&B, Covid) - Nasopharyngeal Swab     Status: None   Collection Time: 11/20/19  3:47 PM   Specimen: Nasopharyngeal Swab  Result Value Ref Range Status   SARS Coronavirus 2 by RT PCR NEGATIVE NEGATIVE Final    Comment: (NOTE) SARS-CoV-2 target nucleic acids are NOT DETECTED.  The SARS-CoV-2 RNA is generally detectable in upper respiratoy specimens during the acute phase of infection. The lowest concentration of SARS-CoV-2 viral copies this assay can detect is 131 copies/mL. A negative result does not preclude SARS-Cov-2 infection and should not be used as the sole basis for treatment or other patient management decisions. A negative result may occur with  improper specimen collection/handling, submission of specimen other than nasopharyngeal swab, presence of viral mutation(s) within the areas targeted by this assay, and inadequate number of viral copies (<131 copies/mL). A negative result  must be combined with clinical observations, patient history, and epidemiological information. The expected result is Negative.  Fact Sheet for Patients:  PinkCheek.be  Fact Sheet for Healthcare Providers:  GravelBags.it  This test is no t yet approved or cleared by the [place] FDA and  has been authorized for detection and/or diagnosis of SARS-CoV-2 by FDA under an Emergency Use Authorization (EUA). This EUA will remain  in effect (meaning this test can be used) for the duration of the COVID-19 declaration under Section 564(b)(1) of the Act, 21 U.S.C. section 360bbb-3(b)(1), unless the authorization is terminated or revoked sooner.     Influenza A by PCR NEGATIVE NEGATIVE Final   Influenza B by PCR NEGATIVE NEGATIVE Final    Comment: (NOTE) The Xpert Xpress SARS-CoV-2/FLU/RSV assay is intended as an aid in  the diagnosis of influenza from Nasopharyngeal swab specimens and  should not be used as a sole basis for treatment. Nasal washings and  aspirates are unacceptable for Xpert Xpress SARS-CoV-2/FLU/RSV  testing.  Fact Sheet for Patients: PinkCheek.be  Fact Sheet for Healthcare Providers: GravelBags.it  This test is not yet approved or cleared by the [place] FDA and  has been authorized for detection and/or diagnosis of SARS-CoV-2 by  FDA under an Emergency Use Authorization (EUA). This EUA will remain  in effect (meaning this test can be used) for the duration of the  Covid-19 declaration under Section 564(b)(1) of the Act, 21  U.S.C. section 360bbb-3(b)(1), unless the authorization is  terminated or revoked. Performed at [facility], [place] [address], [place], [place] [address]   Blood Culture (routine x 2)     Status: None (Preliminary result)   Collection Time: 11/20/19  3:47 PM   Specimen: BLOOD  Result Value Ref Range  Status   Specimen Description   Final    BLOOD LEFT ANTECUBITAL Performed at [facility] [address], [place], [place] [address]    Special Requests   Final    BOTTLES DRAWN AEROBIC AND ANAEROBIC Blood Culture results may not be optimal due to an excessive volume of blood received in culture bottles Performed at [facility] [address] [place], [place] [address]    Culture   Final    NO GROWTH < 12 HOURS Performed at [facility] [address], [place], [place] [address]    Report Status PENDING  Incomplete    No results for input(s): LIPASE, AMYLASE in the last 168 hours. No results for input(s): AMMONIA in the last 168 hours.  Cardiac Enzymes: No results for input(s): CKTOTAL, CKMB, CKMBINDEX, TROPONINI in the last 168 hours. BNP (last 3 results) Recent Labs    09/27/19 0403 11/20/19 1547  BNP 609.5* 1,251.9*    ProBNP (last 3 results) No results for input(s): PROBNP in the last 8760 hours.  Studies:  DG Chest 2 View  Result Date: 11/20/2019 CLINICAL DATA:  Shortness of breath. EXAM: CHEST - 2 VIEW COMPARISON:  October 02, 2019. FINDINGS: Stable cardiomediastinal silhouette. Right lung is clear. Left upper lobe nodule or mass is enlarged compared to prior exam concerning for malignancy. Stable loculated left pleural effusion is noted with associated atelectasis or infiltrate. Bony thorax is unremarkable. IMPRESSION: Left upper lobe nodule or mass is enlarged compared to prior exam concerning for malignancy. Stable loculated left pleural effusion with associated atelectasis or infiltrate. Electronically Signed   By: Marijo Conception M.D.   On: 11/20/2019 15:55   CT Angio Chest PE W and/or Wo Contrast  Result Date: 11/20/2019 CLINICAL DATA:  Shortness of breath, productive cough. History of lung cancer. EXAM: CT ANGIOGRAPHY CHEST WITH CONTRAST TECHNIQUE: Multidetector CT imaging of the chest was performed using the  standard protocol during bolus administration of intravenous contrast. Multiplanar CT image reconstructions and MIPs were obtained to evaluate the vascular anatomy. CONTRAST:  129mL OMNIPAQUE IOHEXOL 350 MG/ML SOLN  COMPARISON:  September 28, 2019. FINDINGS: Cardiovascular: Satisfactory opacification of the pulmonary arteries to the segmental level. No evidence of pulmonary embolism. Normal heart size. No pericardial effusion. Coronary artery calcifications are noted. Mediastinum/Nodes: No enlarged mediastinal, hilar, or axillary lymph nodes. Thyroid gland, trachea, and esophagus demonstrate no significant findings. Lungs/Pleura: No pneumothorax is noted. Emphysematous disease is noted in the upper lobes. Small right pleural effusion is noted with adjacent subsegmental atelectasis of the right lower lobe. Mild left posterior basilar subsegmental atelectasis is noted. Grossly stable 3.5 x 2.7 cm mass is noted in left upper lobe consistent with malignancy and or post treatment change. Upper Abdomen: No acute abnormality. Musculoskeletal: No chest wall abnormality. No acute or significant osseous findings. Review of the MIP images confirms the above findings. IMPRESSION: 1. No definite evidence of pulmonary embolus. 2. Coronary artery calcifications are noted suggesting coronary artery disease. 3. Small right pleural effusion is noted with adjacent subsegmental atelectasis of the right lower lobe. 4. Grossly stable 3.5 x 2.7 cm mass is noted in left upper lobe consistent with malignancy and or post treatment change. 5. Emphysema and aortic atherosclerosis. Aortic Atherosclerosis (ICD10-I70.0) and Emphysema (ICD10-J43.9). Electronically Signed   By: Marijo Conception M.D.   On: 11/20/2019 18:58   DG Chest Port 1 View  Result Date: 11/21/2019 CLINICAL DATA:  Shortness of breath EXAM: PORTABLE CHEST 1 VIEW COMPARISON:  11/20/2019 FINDINGS: Small right pleural effusion and adjacent atelectasis. Small left pleural effusion  and adjacent atelectasis. Similar patchy left lung opacification with left upper lobe mass. No pneumothorax. Similar cardiomediastinal contours. IMPRESSION: No substantial change since prior study. Persistent pleural effusions and adjacent atelectasis. Left upper lobe mass with patchy left lung atelectasis/consolidation. Electronically Signed   By: Macy Mis M.D.   On: 11/21/2019 11:20   ECHOCARDIOGRAM COMPLETE  Result Date: 11/21/2019    ECHOCARDIOGRAM REPORT   Patient Name:   Tyler Mcmillan Date of Exam: 11/21/2019 Medical Rec #:  [id number]             Height:       70.0 in Accession #:    [id number]            Weight:       174.2 lb Date of Birth:  1945-07-24             BSA:          1.968 m Patient Age:    35 years              BP:           158/72 mmHg Patient Gender: M                     HR:           85 bpm. Exam Location:  Inpatient Procedure: 2D Echo, Color Doppler and Cardiac Doppler Indications:     X58.83 Acute diastolic (congestive) heart failure  History:         Patient has prior history of Echocardiogram examinations, most                  recent 09/28/2019. Risk Factors:Hypertension, Current Smoker and                  ETOH Abuse. Lung Cancer.  Sonographer:     Raquel Sarna Senior RDCS Referring Phys:  [place] Diagnosing Phys: Gwyndolyn Kaufman MD IMPRESSIONS  1. Left ventricular ejection fraction, by estimation, is  60 to 65%. The left ventricle has normal function. The left ventricle has no regional wall motion abnormalities. There is assymetric severe hypertrophy of the basal septum with mild concentric hypertrophy of the other LV segments. Left ventricular diastolic parameters are consistent with Grade II diastolic dysfunction (pseudonormalization).  2. Right ventricular systolic function is normal. The right ventricular size is mildly enlarged.  3. Right atrial size was moderately dilated.  4. The mitral valve is grossly normal. Mild mitral valve regurgitation.  5. The  aortic valve is tricuspid. There is mild calcification of the aortic valve. There is mild thickening of the aortic valve. Aortic valve regurgitation is not visualized. Mild to moderate aortic valve sclerosis/calcification is present, without any evidence of aortic stenosis.  6. The inferior vena cava is dilated in size with <50% respiratory variability, suggesting right atrial pressure of 15 mmHg.  7. Aortic dilatation noted. There is borderline dilatation of the aortic root, measuring 38 mm. Comparison(s): Compared to prior study on 09/2019, there is grade II diastolic dysfunction and the RV appears mildly dilated. Otherwise, no significant changes. FINDINGS  Left Ventricle: Left ventricular ejection fraction, by estimation, is 60 to 65%. The left ventricle has normal function. The left ventricle has no regional wall motion abnormalities. The left ventricular internal cavity size was normal in size. There is  assymetric severe hypertrophy of the basal septum with mild concentric hypertrophy of the other LV segments. Left ventricular diastolic parameters are consistent with Grade II diastolic dysfunction (pseudonormalization). Right Ventricle: The right ventricular size is mildly enlarged. No increase in right ventricular wall thickness. Right ventricular systolic function is normal. Left Atrium: Left atrial size was normal in size. Right Atrium: Right atrial size was moderately dilated. Pericardium: There is no evidence of pericardial effusion. Mitral Valve: The mitral valve is grossly normal. There is mild thickening of the mitral valve leaflet(s). Mild mitral annular calcification. Mild mitral valve regurgitation. Tricuspid Valve: The tricuspid valve is normal in structure. Tricuspid valve regurgitation is trivial. Aortic Valve: The aortic valve is tricuspid. There is mild calcification of the aortic valve. There is mild thickening of the aortic valve. Aortic valve regurgitation is not visualized. Mild to  moderate aortic valve sclerosis/calcification is present, without any evidence of aortic stenosis. Pulmonic Valve: The pulmonic valve was normal in structure. Pulmonic valve regurgitation is not visualized. Aorta: Aortic dilatation noted. There is borderline dilatation of the aortic root, measuring 38 mm. Venous: The inferior vena cava is dilated in size with less than 50% respiratory variability, suggesting right atrial pressure of 15 mmHg. IAS/Shunts: No atrial level shunt detected by color flow Doppler.  LEFT VENTRICLE PLAX 2D LVIDd:         4.10 cm  Diastology LVIDs:         2.50 cm  LV e' medial:    7.94 cm/s LV PW:         1.20 cm  LV E/e' medial:  14.9 LV IVS:        1.30 cm  LV e' lateral:   9.14 cm/s LVOT diam:     2.20 cm  LV E/e' lateral: 12.9 LV SV:         90 LV SV Index:   46 LVOT Area:     3.80 cm  RIGHT VENTRICLE RV S prime:     13.50 cm/s TAPSE (M-mode): 2.3 cm LEFT ATRIUM             Index       RIGHT  ATRIUM           Index LA diam:        3.90 cm 1.98 cm/m  RA Area:     26.60 cm LA Vol (A2C):   56.2 ml 28.55 ml/m RA Volume:   85.90 ml  43.64 ml/m LA Vol (A4C):   30.3 ml 15.39 ml/m LA Biplane Vol: 44.1 ml 22.40 ml/m  AORTIC VALVE LVOT Vmax:   105.00 cm/s LVOT Vmean:  77.200 cm/s LVOT VTI:    0.238 m  AORTA Ao Root diam: 3.70 cm Ao Asc diam:  3.20 cm MITRAL VALVE MV Area (PHT): 3.72 cm     SHUNTS MV Decel Time: 204 msec     Systemic VTI:  0.24 m MV E velocity: 118.00 cm/s  Systemic Diam: 2.20 cm MV A velocity: 98.10 cm/s MV E/A ratio:  1.20 Gwyndolyn Kaufman MD Electronically signed by Gwyndolyn Kaufman MD Signature Date/Time: 11/21/2019/10:40:50 AM    Final (Updated)    VAS [place] LOWER EXTREMITY VENOUS (DVT) (MC and WL 7a-7p)  Result Date: 11/20/2019  Lower Venous DVTStudy Indications: Edema.  Risk Factors: None identified. Limitations: Body habitus and poor ultrasound/tissue interface. Comparison Study: No prior studies. Performing Technologist: Oliver Hum RVT  Examination  Guidelines: A complete evaluation includes B-mode imaging, spectral Doppler, color Doppler, and power Doppler as needed of all accessible portions of each vessel. Bilateral testing is considered an integral part of a complete examination. Limited examinations for reoccurring indications may be performed as noted. The reflux portion of the exam is performed with the patient in reverse Trendelenburg.  +---------+---------------+---------+-----------+----------+--------------+  RIGHT     Compressibility Phasicity Spontaneity Properties Thrombus Aging  +---------+---------------+---------+-----------+----------+--------------+  CFV       Full            Yes       Yes                                    +---------+---------------+---------+-----------+----------+--------------+  SFJ       Full                                                             +---------+---------------+---------+-----------+----------+--------------+  FV Prox   Full                                                             +---------+---------------+---------+-----------+----------+--------------+  FV Mid                    Yes       Yes                                    +---------+---------------+---------+-----------+----------+--------------+  FV Distal                 Yes       Yes                                    +---------+---------------+---------+-----------+----------+--------------+  PFV       Full                                                             +---------+---------------+---------+-----------+----------+--------------+  POP       Full            Yes       Yes                                    +---------+---------------+---------+-----------+----------+--------------+  PTV                                                        Not visualized  +---------+---------------+---------+-----------+----------+--------------+  PERO                                                       Not visualized   +---------+---------------+---------+-----------+----------+--------------+   +---------+---------------+---------+-----------+----------+--------------+  LEFT      Compressibility Phasicity Spontaneity Properties Thrombus Aging  +---------+---------------+---------+-----------+----------+--------------+  CFV       Full            Yes       Yes                                    +---------+---------------+---------+-----------+----------+--------------+  SFJ       Full                                                             +---------+---------------+---------+-----------+----------+--------------+  FV Prox   Full                                                             +---------+---------------+---------+-----------+----------+--------------+  FV Mid    Full                                                             +---------+---------------+---------+-----------+----------+--------------+  FV Distal                 Yes       Yes                                    +---------+---------------+---------+-----------+----------+--------------+  PFV       Full                                                             +---------+---------------+---------+-----------+----------+--------------+  POP       Full            Yes       Yes                                    +---------+---------------+---------+-----------+----------+--------------+  PTV       Full                                                             +---------+---------------+---------+-----------+----------+--------------+  PERO                                                       Not visualized  +---------+---------------+---------+-----------+----------+--------------+     Summary: RIGHT: - There is no evidence of deep vein thrombosis in the lower extremity. However, portions of this examination were limited- see technologist comments above.  - No cystic structure found in the popliteal fossa.  LEFT: - There is no evidence of deep vein  thrombosis in the lower extremity. However, portions of this examination were limited- see technologist comments above.  - No cystic structure found in the popliteal fossa.  *See table(s) above for measurements and observations. Electronically signed by Deitra Mayo MD on 11/20/2019 at 8:14:37 PM.    Final        Oswald Hillock   [facility] If 7PM-7AM, please contact night-coverage at [email or web address], Office  [phone]   11/21/2019, 6:35 PM  LOS: 1 day

## 2019-11-21 NOTE — Progress Notes (Signed)
Echocardiogram 2D Echocardiogram has been performed.  Tyler Mcmillan Tyler Mcmillan 11/21/2019, 8:43 AM

## 2019-11-21 NOTE — ED Provider Notes (Signed)
Called to bedside for hypoxia with saturations of 59%. On arrival to the room patient laying supine on the stretcher with nasal cannula in place. Patient lethargic, tachypnea but arouses to verbal stimuli. He has decreased air movement bilaterally. He has increased work of breathing. He was repositioned in the stretcher and placed on increased supplemental oxygen. With repositioning his mental status did improve, he does have persistent tachypnea. Will check ABG to evaluate for CO2 retention. Will check repeat chest x-ray.  Case discussed with hospitalist for repeat assessment.   Quintella Reichert, MD 11/21/19 1550

## 2019-11-22 DIAGNOSIS — C3412 Malignant neoplasm of upper lobe, left bronchus or lung: Secondary | ICD-10-CM | POA: Diagnosis not present

## 2019-11-22 DIAGNOSIS — F1010 Alcohol abuse, uncomplicated: Secondary | ICD-10-CM | POA: Diagnosis not present

## 2019-11-22 DIAGNOSIS — J9621 Acute and chronic respiratory failure with hypoxia: Secondary | ICD-10-CM | POA: Diagnosis not present

## 2019-11-22 LAB — BASIC METABOLIC PANEL
Anion gap: 10 (ref 5–15)
Anion gap: 8 (ref 5–15)
BUN: 16 mg/dL (ref 8–23)
BUN: 19 mg/dL (ref 8–23)
CO2: 34 mmol/L — ABNORMAL HIGH (ref 22–32)
CO2: 34 mmol/L — ABNORMAL HIGH (ref 22–32)
Calcium: 8 mg/dL — ABNORMAL LOW (ref 8.9–10.3)
Calcium: 7.9 mg/dL — ABNORMAL LOW (ref 8.9–10.3)
Chloride: 96 mmol/L — ABNORMAL LOW (ref 98–111)
Chloride: 97 mmol/L — ABNORMAL LOW (ref 98–111)
Creatinine, Ser: 0.74 mg/dL (ref 0.61–1.24)
Creatinine, Ser: 0.8 mg/dL (ref 0.61–1.24)
GFR, Estimated: >60 mL/min (ref >60)
GFR, Estimated: >60 mL/min (ref >60)
Glucose, Bld: 91 mg/dL (ref 70–99)
Glucose, Bld: 102 mg/dL — ABNORMAL HIGH (ref 70–99)
Potassium: 2.9 mmol/L — ABNORMAL LOW (ref 3.5–5.1)
Potassium: 3.8 mmol/L (ref 3.5–5.1)
Sodium: 140 mmol/L (ref 135–145)
Sodium: 139 mmol/L (ref 135–145)

## 2019-11-22 MED ORDER — POTASSIUM CHLORIDE CRYS ER 20 MEQ PO TBCR
40.0000 meq | EXTENDED_RELEASE_TABLET | Freq: Once | ORAL | Status: AC
  Administered 2019-11-22: 40 meq via ORAL
  Filled 2019-11-22: qty 2

## 2019-11-22 MED ORDER — ENSURE ENLIVE PO LIQD
237.0000 mL | ORAL | Status: DC
  Administered 2019-11-27: 237 mL via ORAL

## 2019-11-22 MED ORDER — PROSOURCE PLUS PO LIQD
30.0000 mL | Freq: Two times a day (BID) | ORAL | Status: DC
  Administered 2019-11-22 – 2019-11-24 (×3): 30 mL via ORAL
  Filled 2019-11-22 (×9): qty 30

## 2019-11-22 MED ORDER — POTASSIUM CHLORIDE 10 MEQ/100ML IV SOLN
10.0000 meq | INTRAVENOUS | Status: AC
  Administered 2019-11-22 (×5): 10 meq via INTRAVENOUS
  Filled 2019-11-22 (×5): qty 100

## 2019-11-22 NOTE — Progress Notes (Signed)
Initial Nutrition Assessment  RD working remotely.  DOCUMENTATION CODES:   Not applicable  INTERVENTION:  - will order Ensure Enlive once/day, each supplement provides 350 kcal and 20 grams of protein. - will order 30 ml Prosource Plus BID, each supplement provides 100 kcal and 15 grams protein.  - will complete NFPE at follow-up.  NUTRITION DIAGNOSIS:   Increased nutrient needs related to acute illness as evidenced by estimated needs.  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 He presented to ED with complaints of shortness of breath, cough, and overall feeling unwell. CT angio chest showed small R pleural effusion with 3.5 cm x 2.7 cm mass in the LUL, no evidence of PE.  No intakes documented since admission. Weight today is 193 lb and weight is significantly up (+19 lb) compared to weight on 9/4. Generalized mild pitting edema, non-pitting edema to LUE, and moderate pitting edema to BLE documented in the flow sheet.   Per notes: - acute on chronic hypoxemic respiratory failure  - suspected underlying CHF with acute on chronic exacerbation - PNA - hx of alcohol abuse on CIWA protocol    Labs reviewed; K: 2.9 mmol/l, Cl: 96 mmol/l, Ca: 8 mg/dl. Medications reviewed; 1 mg folvite/day, 40 mg IV lasix BID, 1 tablet multivitamin with minerals/day, 40 mg oral protonix BID, 10 mEq IV KCl x5 runs 10/14, 40 mEq Klor-Con x1 dose 10/14, 1 tablet senokot/day, 100 mg oral thiamine/day.     NUTRITION - FOCUSED PHYSICAL EXAM:  unable to complete due to not being on campus.  Diet Order:   Diet Order             Diet Heart Room service appropriate? Yes; Fluid consistency: Thin  Diet effective now                 EDUCATION NEEDS:   No education needs have been identified at this time  Skin:  Skin Assessment: Reviewed RN Assessment  Last BM:  10/14 (type 7 x2)  Height:   Ht Readings from Last 1 Encounters:  11/21/19 5\' 10"  (1.778 m)    Weight:   Wt Readings from Last 1 Encounters:  11/22/19 87.6 kg    Estimated Nutritional Needs:  Kcal:  1800-2000 kcal Protein:  85-95 grams Fluid:  >/= 1.5 L/day      Jarome Matin, MS, RD, LDN, CNSC Inpatient Clinical Dietitian RD pager # available in [facility]  After hours/weekend pager # available in [facility]

## 2019-11-22 NOTE — Progress Notes (Signed)
[facility]  PROGRESS NOTE  Tyler Mcmillan PRF:[id number] DOB: 1946/01/24 DOA: 11/20/2019 PCP: [name], MD   Brief HPI:   74 year old male with history of bladder cancer, abdominal attic and rhythm, chronic anxiety, alcoholism, GERD, hypertension, squamous cell carcinoma of the left lung was diagnosed previously in 2018.  Followed by Dr. [name], Dr. [name], Dr. [name].  He had stereotactic radiation therapy on 08/25/2017.  Patient appears to have lost follow-up since last year.  Came to ED with complaints of shortness of breath cough and feeling terrible.  BNP was elevated, CT and x-ray chest did not show overt CHF.  CTA chest showed small right pleural effusion with 3.5 cm x 2.7 cm mass in the left upper lobe, no evidence of PE.ABG done this morning showed PCO2 69.5, pH 7.302, PO2 77.9.    Subjective   Patient seen and examined, breathing somewhat improved.  He is diuresing well with IV Lasix.   Assessment/Plan:     1. Acute on chronic hypoxemic respiratory failure-patient requiring 5 L/min of oxygen via nasal cannula.  Likely from underlying diastolic CHF.  BNP is elevated to 1200.  Patient ran out of oral Lasix.  Patient also has pneumonia and postobstructive symptoms from lung cancer.  Patient started on Lasix 40 mg IV every 12 hours.  Continue monitor intake and output. 2. Acute on chronic diastolic CHF-echocardiogram done today shows grade 2 diastolic dysfunction, EF 93%.  Started on Lasix as above.  BNP is elevated at 1200.  Foley catheter has been inserted as patient unable to get up to urinate due to ongoing worsening respiratory status.  Condom catheter could not be placed due to patient's anatomy. 3. Alcohol abuse-continue CIWA protocol. 4. Hypokalemia-potassium is 2.9, will replace potassium, follow BMP in am.     COVID-19 Labs  Recent Labs    11/20/19 1547  DDIMER 1.80*  FERRITIN 13*  LDH 145  CRP 1.3*    Lab Results  Component Value Date    SARSCOV2NAA NEGATIVE 11/20/2019   SARSCOV2NAA NEGATIVE 10/06/2019   [place] NEGATIVE 09/27/2019     Scheduled medications:   . (feeding supplement) PROSource Plus  30 mL Oral BID BM  . Chlorhexidine Gluconate Cloth  6 each Topical Daily  . enoxaparin (LOVENOX) injection  40 mg Subcutaneous Q24H  . feeding supplement  237 mL Oral Q24H  . folic acid  1 mg Oral Daily  . furosemide  40 mg Intravenous BID  . guaiFENesin  600 mg Oral BID  . multivitamin with minerals  1 tablet Oral Daily  . oxyCODONE  20 mg Oral Q4H  . pantoprazole  40 mg Oral BID  . senna  1 tablet Oral Daily  . sodium chloride flush  3 mL Intravenous Q12H  . thiamine  100 mg Oral Daily   Or  . thiamine  100 mg Intravenous Daily         CBG: No results for input(s): GLUCAP in the last 168 hours.  SpO2: 96 % O2 Flow Rate (L/min): 2 L/min    CBC: Recent Labs  Lab 11/20/19 1547 11/21/19 0000 11/21/19 0527  WBC 8.3 10.0 9.0  NEUTROABS 7.1  --  7.6  HGB 9.7* 9.2* 9.0*  HCT 34.0* 32.6* 32.3*  MCV 85.4 85.6 86.6  PLT 300 265 570    Basic Metabolic Panel: Recent Labs  Lab 11/20/19 1547 11/21/19 0000 11/21/19 0527 11/22/19 0425  NA 137 138 139 140  K 4.1 3.9 3.6 2.9*  CL 98 97*  98 96*  CO2 30 32 31 34*  GLUCOSE 118* 106* 95 91  BUN 16 17 16 16   CREATININE 0.83 0.77 0.79 0.74  CALCIUM 8.4* 8.1* 8.2* 8.0*  MG  --  2.0  --   --   PHOS  --  3.0  --   --      Liver Function Tests: Recent Labs  Lab 11/20/19 1547 11/21/19 0000  AST 22 22  ALT 12 12  ALKPHOS 68 67  BILITOT 0.4 0.6  PROT 7.6 7.1  ALBUMIN 3.4* 3.2*     Antibiotics: Anti-infectives (From admission, onward)   None       DVT prophylaxis: Lovenox  Code Status: Full code  Family Communication: No family at bedside    Status is: Inpatient  Dispo: The patient is from: Home              Anticipated d/c is to: Home              Anticipated d/c date is: 11/23/2019              Patient currently not medically  stable for discharge  Barrier to discharge-ongoing treatment for acute on chronic hypoxemic respiratory failure  Pressure Injury 03/30/18 Stage I -  Intact skin with non-blanchable redness of a localized area usually over a bony prominence. (Active)  03/30/18 2000  Location: Sacrum  Location Orientation:   Staging: Stage I -  Intact skin with non-blanchable redness of a localized area usually over a bony prominence.  Wound Description (Comments):   Present on Admission: Yes         Consultants:    Procedures:     Objective   Vitals:   11/22/19 0135 11/22/19 0432 11/22/19 0448 11/22/19 1254  BP: (!) 163/74 (!) 158/83  (!) 149/76  Pulse: 81 80  81  Resp: 20 (!) 22  13  Temp: 98.2 F (36.8 C) 98.7 F (37.1 C)  98.7 F (37.1 C)  TempSrc:    Oral  SpO2: 96% 94%  96%  Weight:   87.6 kg   Height:        Physical Examination:   General-appears in no acute distress  Heart-S1-S2, regular, no murmur auscultated  Lungs-decreased breath sounds bilaterally  Abdomen-soft, nontender, no organomegaly  Extremities-no edema in the lower extremities  Neuro-alert, oriented x3, no focal deficit noted    Data Reviewed:   Recent Results (from the past 240 hour(s))  Respiratory Panel by RT PCR (Flu A&B, Covid) - Nasopharyngeal Swab     Status: None   Collection Time: 11/20/19  3:47 PM   Specimen: Nasopharyngeal Swab  Result Value Ref Range Status   SARS Coronavirus 2 by RT PCR NEGATIVE NEGATIVE Final    Comment: (NOTE) SARS-CoV-2 target nucleic acids are NOT DETECTED.  The SARS-CoV-2 RNA is generally detectable in upper respiratoy specimens during the acute phase of infection. The lowest concentration of SARS-CoV-2 viral copies this assay can detect is 131 copies/mL. A negative result does not preclude SARS-Cov-2 infection and should not be used as the sole basis for treatment or other patient management decisions. A negative result may occur with  improper  specimen collection/handling, submission of specimen other than nasopharyngeal swab, presence of viral mutation(s) within the areas targeted by this assay, and inadequate number of viral copies (<131 copies/mL). A negative result must be combined with clinical observations, patient history, and epidemiological information. The expected result is Negative.  Fact Sheet for Patients:  PinkCheek.be  Fact Sheet for Healthcare Providers:  GravelBags.it  This test is no t yet approved or cleared by the [place] FDA and  has been authorized for detection and/or diagnosis of SARS-CoV-2 by FDA under an Emergency Use Authorization (EUA). This EUA will remain  in effect (meaning this test can be used) for the duration of the COVID-19 declaration under Section 564(b)(1) of the Act, 21 U.S.C. section 360bbb-3(b)(1), unless the authorization is terminated or revoked sooner.     Influenza A by PCR NEGATIVE NEGATIVE Final   Influenza B by PCR NEGATIVE NEGATIVE Final    Comment: (NOTE) The Xpert Xpress SARS-CoV-2/FLU/RSV assay is intended as an aid in  the diagnosis of influenza from Nasopharyngeal swab specimens and  should not be used as a sole basis for treatment. Nasal washings and  aspirates are unacceptable for Xpert Xpress SARS-CoV-2/FLU/RSV  testing.  Fact Sheet for Patients: PinkCheek.be  Fact Sheet for Healthcare Providers: GravelBags.it  This test is not yet approved or cleared by the [place] FDA and  has been authorized for detection and/or diagnosis of SARS-CoV-2 by  FDA under an Emergency Use Authorization (EUA). This EUA will remain  in effect (meaning this test can be used) for the duration of the  Covid-19 declaration under Section 564(b)(1) of the Act, 21  U.S.C. section 360bbb-3(b)(1), unless the authorization is  terminated or revoked. Performed at  [facility] [address] [place], [place] [address]   Blood Culture (routine x 2)     Status: None (Preliminary result)   Collection Time: 11/20/19  3:47 PM   Specimen: BLOOD  Result Value Ref Range Status   Specimen Description   Final    BLOOD LEFT ANTECUBITAL Performed at [facility] [address], [place], [place] [address]    Special Requests   Final    BOTTLES DRAWN AEROBIC AND ANAEROBIC Blood Culture results may not be optimal due to an excessive volume of blood received in culture bottles Performed at [facility] [address], [place], [place] [address]    Culture   Final    NO GROWTH 2 DAYS Performed at [facility] [address], [place], [place] [address]    Report Status PENDING  Incomplete    No results for input(s): LIPASE, AMYLASE in the last 168 hours. No results for input(s): AMMONIA in the last 168 hours.  Cardiac Enzymes: No results for input(s): CKTOTAL, CKMB, CKMBINDEX, TROPONINI in the last 168 hours. BNP (last 3 results) Recent Labs    09/27/19 0403 11/20/19 1547  BNP 609.5* 1,251.9*    ProBNP (last 3 results) No results for input(s): PROBNP in the last 8760 hours.  Studies:  DG Chest 2 View  Result Date: 11/20/2019 CLINICAL DATA:  Shortness of breath. EXAM: CHEST - 2 VIEW COMPARISON:  October 02, 2019. FINDINGS: Stable cardiomediastinal silhouette. Right lung is clear. Left upper lobe nodule or mass is enlarged compared to prior exam concerning for malignancy. Stable loculated left pleural effusion is noted with associated atelectasis or infiltrate. Bony thorax is unremarkable. IMPRESSION: Left upper lobe nodule or mass is enlarged compared to prior exam concerning for malignancy. Stable loculated left pleural effusion with associated atelectasis or infiltrate. Electronically Signed   By: Marijo Conception M.D.   On: 11/20/2019 15:55   CT Angio Chest PE W and/or Wo  Contrast  Result Date: 11/20/2019 CLINICAL DATA:  Shortness of breath, productive cough. History of lung  cancer. EXAM: CT ANGIOGRAPHY CHEST WITH CONTRAST TECHNIQUE: Multidetector CT imaging of the chest was performed using the standard protocol during bolus administration of intravenous contrast. Multiplanar CT image reconstructions and MIPs were obtained to evaluate the vascular anatomy. CONTRAST:  122mL OMNIPAQUE IOHEXOL 350 MG/ML SOLN COMPARISON:  September 28, 2019. FINDINGS: Cardiovascular: Satisfactory opacification of the pulmonary arteries to the segmental level. No evidence of pulmonary embolism. Normal heart size. No pericardial effusion. Coronary artery calcifications are noted. Mediastinum/Nodes: No enlarged mediastinal, hilar, or axillary lymph nodes. Thyroid gland, trachea, and esophagus demonstrate no significant findings. Lungs/Pleura: No pneumothorax is noted. Emphysematous disease is noted in the upper lobes. Small right pleural effusion is noted with adjacent subsegmental atelectasis of the right lower lobe. Mild left posterior basilar subsegmental atelectasis is noted. Grossly stable 3.5 x 2.7 cm mass is noted in left upper lobe consistent with malignancy and or post treatment change. Upper Abdomen: No acute abnormality. Musculoskeletal: No chest wall abnormality. No acute or significant osseous findings. Review of the MIP images confirms the above findings. IMPRESSION: 1. No definite evidence of pulmonary embolus. 2. Coronary artery calcifications are noted suggesting coronary artery disease. 3. Small right pleural effusion is noted with adjacent subsegmental atelectasis of the right lower lobe. 4. Grossly stable 3.5 x 2.7 cm mass is noted in left upper lobe consistent with malignancy and or post treatment change. 5. Emphysema and aortic atherosclerosis. Aortic Atherosclerosis (ICD10-I70.0) and Emphysema (ICD10-J43.9). Electronically Signed   By: Marijo Conception M.D.   On: 11/20/2019 18:58    DG Chest Port 1 View  Result Date: 11/21/2019 CLINICAL DATA:  Shortness of breath EXAM: PORTABLE CHEST 1 VIEW COMPARISON:  11/20/2019 FINDINGS: Small right pleural effusion and adjacent atelectasis. Small left pleural effusion and adjacent atelectasis. Similar patchy left lung opacification with left upper lobe mass. No pneumothorax. Similar cardiomediastinal contours. IMPRESSION: No substantial change since prior study. Persistent pleural effusions and adjacent atelectasis. Left upper lobe mass with patchy left lung atelectasis/consolidation. Electronically Signed   By: Macy Mis M.D.   On: 11/21/2019 11:20   ECHOCARDIOGRAM COMPLETE  Result Date: 11/21/2019    ECHOCARDIOGRAM REPORT   Patient Name:   Tyler Mcmillan Date of Exam: 11/21/2019 Medical Rec #:  [id number]             Height:       70.0 in Accession #:    [id number]            Weight:       174.2 lb Date of Birth:  1945-09-03             BSA:          1.968 m Patient Age:    58 years              BP:           158/72 mmHg Patient Gender: M                     HR:           85 bpm. Exam Location:  Inpatient Procedure: 2D Echo, Color Doppler and Cardiac Doppler Indications:     H84.69 Acute diastolic (congestive) heart failure  History:         Patient has prior history of Echocardiogram examinations, most                  recent 09/28/2019. Risk Factors:Hypertension, Current Smoker and  ETOH Abuse. Lung Cancer.  Sonographer:     Raquel Sarna Senior RDCS Referring Phys:  [place] Diagnosing Phys: Gwyndolyn Kaufman MD IMPRESSIONS  1. Left ventricular ejection fraction, by estimation, is 60 to 65%. The left ventricle has normal function. The left ventricle has no regional wall motion abnormalities. There is assymetric severe hypertrophy of the basal septum with mild concentric hypertrophy of the other LV segments. Left ventricular diastolic parameters are consistent with Grade II diastolic dysfunction  (pseudonormalization).  2. Right ventricular systolic function is normal. The right ventricular size is mildly enlarged.  3. Right atrial size was moderately dilated.  4. The mitral valve is grossly normal. Mild mitral valve regurgitation.  5. The aortic valve is tricuspid. There is mild calcification of the aortic valve. There is mild thickening of the aortic valve. Aortic valve regurgitation is not visualized. Mild to moderate aortic valve sclerosis/calcification is present, without any evidence of aortic stenosis.  6. The inferior vena cava is dilated in size with <50% respiratory variability, suggesting right atrial pressure of 15 mmHg.  7. Aortic dilatation noted. There is borderline dilatation of the aortic root, measuring 38 mm. Comparison(s): Compared to prior study on 09/2019, there is grade II diastolic dysfunction and the RV appears mildly dilated. Otherwise, no significant changes. FINDINGS  Left Ventricle: Left ventricular ejection fraction, by estimation, is 60 to 65%. The left ventricle has normal function. The left ventricle has no regional wall motion abnormalities. The left ventricular internal cavity size was normal in size. There is  assymetric severe hypertrophy of the basal septum with mild concentric hypertrophy of the other LV segments. Left ventricular diastolic parameters are consistent with Grade II diastolic dysfunction (pseudonormalization). Right Ventricle: The right ventricular size is mildly enlarged. No increase in right ventricular wall thickness. Right ventricular systolic function is normal. Left Atrium: Left atrial size was normal in size. Right Atrium: Right atrial size was moderately dilated. Pericardium: There is no evidence of pericardial effusion. Mitral Valve: The mitral valve is grossly normal. There is mild thickening of the mitral valve leaflet(s). Mild mitral annular calcification. Mild mitral valve regurgitation. Tricuspid Valve: The tricuspid valve is normal in  structure. Tricuspid valve regurgitation is trivial. Aortic Valve: The aortic valve is tricuspid. There is mild calcification of the aortic valve. There is mild thickening of the aortic valve. Aortic valve regurgitation is not visualized. Mild to moderate aortic valve sclerosis/calcification is present, without any evidence of aortic stenosis. Pulmonic Valve: The pulmonic valve was normal in structure. Pulmonic valve regurgitation is not visualized. Aorta: Aortic dilatation noted. There is borderline dilatation of the aortic root, measuring 38 mm. Venous: The inferior vena cava is dilated in size with less than 50% respiratory variability, suggesting right atrial pressure of 15 mmHg. IAS/Shunts: No atrial level shunt detected by color flow Doppler.  LEFT VENTRICLE PLAX 2D LVIDd:         4.10 cm  Diastology LVIDs:         2.50 cm  LV e' medial:    7.94 cm/s LV PW:         1.20 cm  LV E/e' medial:  14.9 LV IVS:        1.30 cm  LV e' lateral:   9.14 cm/s LVOT diam:     2.20 cm  LV E/e' lateral: 12.9 LV SV:         90 LV SV Index:   46 LVOT Area:     3.80 cm  RIGHT VENTRICLE  RV S prime:     13.50 cm/s TAPSE (M-mode): 2.3 cm LEFT ATRIUM             Index       RIGHT ATRIUM           Index LA diam:        3.90 cm 1.98 cm/m  RA Area:     26.60 cm LA Vol (A2C):   56.2 ml 28.55 ml/m RA Volume:   85.90 ml  43.64 ml/m LA Vol (A4C):   30.3 ml 15.39 ml/m LA Biplane Vol: 44.1 ml 22.40 ml/m  AORTIC VALVE LVOT Vmax:   105.00 cm/s LVOT Vmean:  77.200 cm/s LVOT VTI:    0.238 m  AORTA Ao Root diam: 3.70 cm Ao Asc diam:  3.20 cm MITRAL VALVE MV Area (PHT): 3.72 cm     SHUNTS MV Decel Time: 204 msec     Systemic VTI:  0.24 m MV E velocity: 118.00 cm/s  Systemic Diam: 2.20 cm MV A velocity: 98.10 cm/s MV E/A ratio:  1.20 Gwyndolyn Kaufman MD Electronically signed by Gwyndolyn Kaufman MD Signature Date/Time: 11/21/2019/10:40:50 AM    Final (Updated)    VAS [place] LOWER EXTREMITY VENOUS (DVT) (MC and WL 7a-7p)  Result Date:  11/20/2019  Lower Venous DVTStudy Indications: Edema.  Risk Factors: None identified. Limitations: Body habitus and poor ultrasound/tissue interface. Comparison Study: No prior studies. Performing Technologist: Oliver Hum RVT  Examination Guidelines: A complete evaluation includes B-mode imaging, spectral Doppler, color Doppler, and power Doppler as needed of all accessible portions of each vessel. Bilateral testing is considered an integral part of a complete examination. Limited examinations for reoccurring indications may be performed as noted. The reflux portion of the exam is performed with the patient in reverse Trendelenburg.  +---------+---------------+---------+-----------+----------+--------------+ RIGHT    CompressibilityPhasicitySpontaneityPropertiesThrombus Aging +---------+---------------+---------+-----------+----------+--------------+ CFV      Full           Yes      Yes                                 +---------+---------------+---------+-----------+----------+--------------+ SFJ      Full                                                        +---------+---------------+---------+-----------+----------+--------------+ FV Prox  Full                                                        +---------+---------------+---------+-----------+----------+--------------+ FV Mid                  Yes      Yes                                 +---------+---------------+---------+-----------+----------+--------------+ FV Distal               Yes      Yes                                 +---------+---------------+---------+-----------+----------+--------------+  PFV      Full                                                        +---------+---------------+---------+-----------+----------+--------------+ POP      Full           Yes      Yes                                 +---------+---------------+---------+-----------+----------+--------------+ PTV                                                    Not visualized +---------+---------------+---------+-----------+----------+--------------+ PERO                                                  Not visualized +---------+---------------+---------+-----------+----------+--------------+   +---------+---------------+---------+-----------+----------+--------------+ LEFT     CompressibilityPhasicitySpontaneityPropertiesThrombus Aging +---------+---------------+---------+-----------+----------+--------------+ CFV      Full           Yes      Yes                                 +---------+---------------+---------+-----------+----------+--------------+ SFJ      Full                                                        +---------+---------------+---------+-----------+----------+--------------+ FV Prox  Full                                                        +---------+---------------+---------+-----------+----------+--------------+ FV Mid   Full                                                        +---------+---------------+---------+-----------+----------+--------------+ FV Distal               Yes      Yes                                 +---------+---------------+---------+-----------+----------+--------------+ PFV      Full                                                        +---------+---------------+---------+-----------+----------+--------------+ POP  Full           Yes      Yes                                 +---------+---------------+---------+-----------+----------+--------------+ PTV      Full                                                        +---------+---------------+---------+-----------+----------+--------------+ PERO                                                  Not visualized +---------+---------------+---------+-----------+----------+--------------+     Summary: RIGHT: - There is no evidence of deep vein  thrombosis in the lower extremity. However, portions of this examination were limited- see technologist comments above.  - No cystic structure found in the popliteal fossa.  LEFT: - There is no evidence of deep vein thrombosis in the lower extremity. However, portions of this examination were limited- see technologist comments above.  - No cystic structure found in the popliteal fossa.  *See table(s) above for measurements and observations. Electronically signed by Deitra Mayo MD on 11/20/2019 at 8:14:37 PM.    Final        Oswald Hillock   [facility] If 7PM-7AM, please contact night-coverage at [email or web address], Office  [phone]   11/22/2019, 2:35 PM  LOS: 2 days

## 2019-11-22 NOTE — Progress Notes (Signed)
CRITICAL VALUE ALERT  Critical Value: K- 2.9   Date & Time Notied:  11/22/2019 5501  Provider Notified: Lorra Hals MD  Orders Received/Actions taken: Notified/Awaiting Orders

## 2019-11-22 NOTE — Plan of Care (Signed)
Problem: Clinical Measurements: Goal: Respiratory complications will improve Outcome: Progressing   Problem: Activity: Goal: Risk for activity intolerance will decrease Outcome: Progressing   Problem: Nutrition: Goal: Adequate nutrition will be maintained Outcome: Progressing   Problem: Coping: Goal: Level of anxiety will decrease Outcome: Progressing   Problem: Education: Goal: Knowledge of General Education information will improve Description: Including pain rating scale, medication(s)/side effects and non-pharmacologic comfort measures Outcome: Completed/Met

## 2019-11-22 NOTE — Plan of Care (Signed)
  Problem: Education: Goal: Knowledge of General Education information will improve Description: Including pain rating scale, medication(s)/side effects and non-pharmacologic comfort measures Outcome: Progressing   Problem: Safety: Goal: Ability to remain free from injury will improve Outcome: Progressing   Problem: Pain Managment: Goal: General experience of comfort will improve Outcome: Progressing

## 2019-11-23 DIAGNOSIS — J9621 Acute and chronic respiratory failure with hypoxia: Secondary | ICD-10-CM | POA: Diagnosis not present

## 2019-11-23 DIAGNOSIS — C3412 Malignant neoplasm of upper lobe, left bronchus or lung: Secondary | ICD-10-CM | POA: Diagnosis not present

## 2019-11-23 DIAGNOSIS — F1010 Alcohol abuse, uncomplicated: Secondary | ICD-10-CM | POA: Diagnosis not present

## 2019-11-23 LAB — BASIC METABOLIC PANEL
Anion gap: 9 (ref 5–15)
BUN: 18 mg/dL (ref 8–23)
CO2: 33 mmol/L — ABNORMAL HIGH (ref 22–32)
Calcium: 8 mg/dL — ABNORMAL LOW (ref 8.9–10.3)
Chloride: 97 mmol/L — ABNORMAL LOW (ref 98–111)
Creatinine, Ser: 0.79 mg/dL (ref 0.61–1.24)
GFR, Estimated: >60 mL/min (ref >60)
Glucose, Bld: 84 mg/dL (ref 70–99)
Potassium: 3.2 mmol/L — ABNORMAL LOW (ref 3.5–5.1)
Sodium: 139 mmol/L (ref 135–145)

## 2019-11-23 MED ORDER — FUROSEMIDE 40 MG PO TABS
40.0000 mg | ORAL_TABLET | Freq: Every day | ORAL | Status: DC
  Administered 2019-11-24 – 2019-11-29 (×6): 40 mg via ORAL
  Filled 2019-11-23 (×6): qty 1

## 2019-11-23 MED ORDER — MAGIC MOUTHWASH W/LIDOCAINE
10.0000 mL | Freq: Four times a day (QID) | ORAL | Status: DC | PRN
  Administered 2019-11-23 – 2019-11-26 (×2): 10 mL via ORAL
  Filled 2019-11-23 (×3): qty 10

## 2019-11-23 MED ORDER — POTASSIUM CHLORIDE CRYS ER 20 MEQ PO TBCR
40.0000 meq | EXTENDED_RELEASE_TABLET | Freq: Two times a day (BID) | ORAL | Status: AC
  Administered 2019-11-23 (×2): 40 meq via ORAL
  Filled 2019-11-23 (×2): qty 2

## 2019-11-23 NOTE — Progress Notes (Addendum)
[facility]  PROGRESS NOTE  Tyler Mcmillan ZOX:[id number] DOB: 1945/12/06 DOA: 11/20/2019 PCP: [name], MD   Brief HPI:   74 year old male with history of bladder cancer, abdominal attic and rhythm, chronic anxiety, alcoholism, GERD, hypertension, squamous cell carcinoma of the left lung was diagnosed previously in 2018.  Followed by Dr. [name], Dr. [name], Dr. [name].  He had stereotactic radiation therapy on 08/25/2017.  Patient appears to have lost follow-up since last year.  Came to ED with complaints of shortness of breath cough and feeling terrible.  BNP was elevated, CT and x-ray chest did not show overt CHF.  CTA chest showed small right pleural effusion with 3.5 cm x 2.7 cm mass in the left upper lobe, no evidence of PE.ABG done this morning showed PCO2 69.5, pH 7.302, PO2 77.9.    Subjective   Patient seen and examined, breathing is improved.  Excellent diuresis with IV Lasix.   Assessment/Plan:     1. Acute on chronic hypoxemic respiratory failure-patient requiring 5 L/min of oxygen via nasal cannula.  Likely from underlying diastolic CHF.  BNP is elevated to 1200.  Patient ran out of oral Lasix.  Patient also has pneumonia and postobstructive symptoms from lung cancer.  Patient started on Lasix 40 mg IV every 12 hours.  He has diuresed well.  Will discontinue IV Lasix and start Lasix 40 mg p.o. daily. 2. Acute on chronic diastolic CHF-echocardiogram done today shows grade 2 diastolic dysfunction, EF 81%.  Started on Lasix as above.  BNP is elevated at 1200.  Foley catheter has been inserted as patient unable to get up to urinate due to ongoing worsening respiratory status.  Condom catheter could not be placed due to patient's anatomy.  IV Lasix has been discontinued.  Patient started on Lasix 40 mg p.o. daily. 3. Alcohol abuse-continue CIWA protocol. 4. Hypokalemia-potassium is 3.2, will replace potassium and follow BMP in am.     COVID-19 Labs  No results  for input(s): DDIMER, FERRITIN, LDH, CRP in the last 72 hours.  Lab Results  Component Value Date   SARSCOV2NAA NEGATIVE 11/20/2019   [place] NEGATIVE 10/06/2019   [place] NEGATIVE 09/27/2019     Scheduled medications:   . (feeding supplement) PROSource Plus  30 mL Oral BID BM  . Chlorhexidine Gluconate Cloth  6 each Topical Daily  . enoxaparin (LOVENOX) injection  40 mg Subcutaneous Q24H  . feeding supplement  237 mL Oral Q24H  . folic acid  1 mg Oral Daily  . [START ON 11/24/2019] furosemide  40 mg Oral Daily  . guaiFENesin  600 mg Oral BID  . multivitamin with minerals  1 tablet Oral Daily  . oxyCODONE  20 mg Oral Q4H  . pantoprazole  40 mg Oral BID  . potassium chloride  40 mEq Oral BID  . senna  1 tablet Oral Daily  . sodium chloride flush  3 mL Intravenous Q12H  . thiamine  100 mg Oral Daily   Or  . thiamine  100 mg Intravenous Daily         CBG: No results for input(s): GLUCAP in the last 168 hours.  SpO2: 94 % O2 Flow Rate (L/min): 3 L/min    CBC: Recent Labs  Lab 11/20/19 1547 11/21/19 0000 11/21/19 0527  WBC 8.3 10.0 9.0  NEUTROABS 7.1  --  7.6  HGB 9.7* 9.2* 9.0*  HCT 34.0* 32.6* 32.3*  MCV 85.4 85.6 86.6  PLT 300 265 191    Basic Metabolic  Panel: Recent Labs  Lab 11/21/19 0000 11/21/19 0527 11/22/19 0425 11/22/19 1456 11/23/19 0344  NA 138 139 140 139 139  K 3.9 3.6 2.9* 3.8 3.2*  CL 97* 98 96* 97* 97*  CO2 32 31 34* 34* 33*  GLUCOSE 106* 95 91 102* 84  BUN 17 16 16 19 18   CREATININE 0.77 0.79 0.74 0.80 0.79  CALCIUM 8.1* 8.2* 8.0* 7.9* 8.0*  MG 2.0  --   --   --   --   PHOS 3.0  --   --   --   --      Liver Function Tests: Recent Labs  Lab 11/20/19 1547 11/21/19 0000  AST 22 22  ALT 12 12  ALKPHOS 68 67  BILITOT 0.4 0.6  PROT 7.6 7.1  ALBUMIN 3.4* 3.2*     Antibiotics: Anti-infectives (From admission, onward)   None       DVT prophylaxis: Lovenox  Code Status: Full code  Family Communication: No  family at bedside    Status is: Inpatient  Dispo: The patient is from: Home              Anticipated d/c is to: Home              Anticipated d/c date is: 11/23/2019              Patient currently not medically stable for discharge  Barrier to discharge-ongoing treatment for acute on chronic hypoxemic respiratory failure  Pressure Injury 03/30/18 Stage I -  Intact skin with non-blanchable redness of a localized area usually over a bony prominence. (Active)  03/30/18 2000  Location: Sacrum  Location Orientation:   Staging: Stage I -  Intact skin with non-blanchable redness of a localized area usually over a bony prominence.  Wound Description (Comments):   Present on Admission: Yes         Consultants:    Procedures:     Objective   Vitals:   11/22/19 1254 11/22/19 2031 11/23/19 0551 11/23/19 1300  BP: (!) 149/76 (!) 154/66 (!) 157/76 (!) 147/72  Pulse: 81 78 79 74  Resp: 13 18 18 18   Temp: 98.7 F (37.1 C) 99.4 F (37.4 C) 98.7 F (37.1 C) 98.3 F (36.8 C)  TempSrc: Oral Oral Oral Oral  SpO2: 96% 95% 95% 94%  Weight:      Height:        Physical Examination:   General-appears in no acute distress  Heart-S1-S2, regular, no murmur auscultated  Lungs-decreased breath sounds bilaterally  Abdomen-soft, nontender, no organomegaly  Extremities-no edema in the lower extremities  Neuro-alert, oriented x3, no focal deficit noted    Data Reviewed:   Recent Results (from the past 240 hour(s))  Respiratory Panel by RT PCR (Flu A&B, Covid) - Nasopharyngeal Swab     Status: None   Collection Time: 11/20/19  3:47 PM   Specimen: Nasopharyngeal Swab  Result Value Ref Range Status   SARS Coronavirus 2 by RT PCR NEGATIVE NEGATIVE Final    Comment: (NOTE) SARS-CoV-2 target nucleic acids are NOT DETECTED.  The SARS-CoV-2 RNA is generally detectable in upper respiratoy specimens during the acute phase of infection. The lowest concentration of SARS-CoV-2  viral copies this assay can detect is 131 copies/mL. A negative result does not preclude SARS-Cov-2 infection and should not be used as the sole basis for treatment or other patient management decisions. A negative result may occur with  improper specimen collection/handling, submission of specimen other  than nasopharyngeal swab, presence of viral mutation(s) within the areas targeted by this assay, and inadequate number of viral copies (<131 copies/mL). A negative result must be combined with clinical observations, patient history, and epidemiological information. The expected result is Negative.  Fact Sheet for Patients:  PinkCheek.be  Fact Sheet for Healthcare Providers:  GravelBags.it  This test is no t yet approved or cleared by the [place] FDA and  has been authorized for detection and/or diagnosis of SARS-CoV-2 by FDA under an Emergency Use Authorization (EUA). This EUA will remain  in effect (meaning this test can be used) for the duration of the COVID-19 declaration under Section 564(b)(1) of the Act, 21 U.S.C. section 360bbb-3(b)(1), unless the authorization is terminated or revoked sooner.     Influenza A by PCR NEGATIVE NEGATIVE Final   Influenza B by PCR NEGATIVE NEGATIVE Final    Comment: (NOTE) The Xpert Xpress SARS-CoV-2/FLU/RSV assay is intended as an aid in  the diagnosis of influenza from Nasopharyngeal swab specimens and  should not be used as a sole basis for treatment. Nasal washings and  aspirates are unacceptable for Xpert Xpress SARS-CoV-2/FLU/RSV  testing.  Fact Sheet for Patients: PinkCheek.be  Fact Sheet for Healthcare Providers: GravelBags.it  This test is not yet approved or cleared by the [place] FDA and  has been authorized for detection and/or diagnosis of SARS-CoV-2 by  FDA under an Emergency Use Authorization (EUA).  This EUA will remain  in effect (meaning this test can be used) for the duration of the  Covid-19 declaration under Section 564(b)(1) of the Act, 21  U.S.C. section 360bbb-3(b)(1), unless the authorization is  terminated or revoked. Performed at [facility], [employer] [address] [place], [place] [address]   Blood Culture (routine x 2)     Status: None (Preliminary result)   Collection Time: 11/20/19  3:47 PM   Specimen: BLOOD  Result Value Ref Range Status   Specimen Description   Final    BLOOD LEFT ANTECUBITAL Performed at [facility] [address], [place], [place] [address]    Special Requests   Final    BOTTLES DRAWN AEROBIC AND ANAEROBIC Blood Culture results may not be optimal due to an excessive volume of blood received in culture bottles Performed at [facility] [address], [place], [place] [address]    Culture   Final    NO GROWTH 3 DAYS Performed at [facility], [place] [address] [place], [place] [address]    Report Status PENDING  Incomplete    No results for input(s): LIPASE, AMYLASE in the last 168 hours. No results for input(s): AMMONIA in the last 168 hours.  Cardiac Enzymes: No results for input(s): CKTOTAL, CKMB, CKMBINDEX, TROPONINI in the last 168 hours. BNP (last 3 results) Recent Labs    09/27/19 0403 11/20/19 1547  BNP 609.5* 1,251.9*    ProBNP (last 3 results) No results for input(s): PROBNP in the last 8760 hours.  Studies:  No results found.     Oswald Hillock   [facility] If 7PM-7AM, please contact night-coverage at [email or web address], Office  [phone]   11/23/2019, 4:16 PM  LOS: 3 days

## 2019-11-23 NOTE — Care Management Important Message (Signed)
Important Message  Patient Details IM Letter given to the Patient Name: Tyler Mcmillan MRN: [id number] Date of Birth: 12/16/1945   Medicare Important Message Given:  Yes     Kerin Salen 11/23/2019, 11:26 AM

## 2019-11-23 NOTE — Progress Notes (Signed)
PT Cancellation Note  Patient Details Name: Tyler Mcmillan MRN: [id number] DOB: 08/10/1945   Cancelled Treatment:    Reason Eval/Treat Not Completed: Patient declined, no reason specified Pt requests to be "left alone for a little bit" and then states he knows he needs to get up and move around before he discharges.  Pt requests to start physical therapy tomorrow.   Tyler Mcmillan,KATHrine E 11/23/2019, 11:58 AM Arlyce Dice, DPT Acute Rehabilitation Services Pager: [phone] Office: [phone]

## 2019-11-24 DIAGNOSIS — J9621 Acute and chronic respiratory failure with hypoxia: Secondary | ICD-10-CM | POA: Diagnosis not present

## 2019-11-24 DIAGNOSIS — C3412 Malignant neoplasm of upper lobe, left bronchus or lung: Secondary | ICD-10-CM | POA: Diagnosis not present

## 2019-11-24 DIAGNOSIS — F1010 Alcohol abuse, uncomplicated: Secondary | ICD-10-CM | POA: Diagnosis not present

## 2019-11-24 LAB — BASIC METABOLIC PANEL
Anion gap: 9 (ref 5–15)
BUN: 15 mg/dL (ref 8–23)
CO2: 32 mmol/L (ref 22–32)
Calcium: 8.5 mg/dL — ABNORMAL LOW (ref 8.9–10.3)
Chloride: 97 mmol/L — ABNORMAL LOW (ref 98–111)
Creatinine, Ser: 0.74 mg/dL (ref 0.61–1.24)
GFR, Estimated: >60 mL/min (ref >60)
Glucose, Bld: 101 mg/dL — ABNORMAL HIGH (ref 70–99)
Potassium: 4.2 mmol/L (ref 3.5–5.1)
Sodium: 138 mmol/L (ref 135–145)

## 2019-11-24 NOTE — Evaluation (Signed)
Physical Therapy Evaluation Patient Details Name: Tyler Mcmillan MRN: [id number] DOB: 11-May-1945 Today's Date: 11/24/2019   History of Present Illness  Pt is 74 yo male with hx of bladder CA, anxiety, alcoholism, GERD, HTN, Squamous cell L lung CA. He presented with shortness of breath and cough.  Pt found to have small R pleural effusion and acute on chronic resp failure likely from underlying CHF.  Clinical Impression  Pt admitted with above diagnosis. Pt required encouragement to participate with PT but was pleasant.  He required supervision to min guard level for safety and was able to ambulate 120' with RW.  Pt was on 3 LPM O2 with sats stable - does not wear O2 at home.  Required min cues for safety and transfer technique.  Pt lives alone but was independent with AD for all activities.   Pt currently with functional limitations due to the deficits listed below (see PT Problem List). Pt will benefit from skilled PT to increase their independence and safety with mobility to allow discharge to the venue listed below.       Follow Up Recommendations Home health PT    Equipment Recommendations  None recommended by PT    Recommendations for Other Services       Precautions / Restrictions Precautions Precautions: Fall Precaution Comments: On CIWA protocol      Mobility  Bed Mobility Overal bed mobility: Needs Assistance Bed Mobility: Supine to Sit;Sit to Supine     Supine to sit: Min guard Sit to supine: Min guard   General bed mobility comments: assist with lines/leads  Transfers Overall transfer level: Needs assistance Equipment used: Rolling walker (2 wheeled) Transfers: Sit to/from Stand Sit to Stand: Min guard         General transfer comment: required 2 attempts and cues for hand placement  Ambulation/Gait Ambulation/Gait assistance: Min guard Gait Distance (Feet): 120 Feet Assistive device: Rolling walker (2 wheeled) Gait Pattern/deviations: Step-to  pattern;Decreased stride length;Shuffle Gait velocity: decreased   General Gait Details: cues for RW proximity; min guard for safety - no overt LOB  Stairs            Wheelchair Mobility    Modified Rankin (Stroke Patients Only)       Balance Overall balance assessment: Needs assistance Sitting-balance support: No upper extremity supported Sitting balance-Leahy Scale: Good     Standing balance support: Bilateral upper extremity supported Standing balance-Leahy Scale: Fair Standing balance comment: Used RW for gait; static without AD                             Pertinent Vitals/Pain Pain Assessment: 0-10 Pain Score: 8  Pain Location: "tumor site" and bones Pain Descriptors / Indicators: Discomfort Pain Intervention(s): Limited activity within patient's tolerance;Monitored during session    Home Living Family/patient expects to be discharged to:: Private residence Living Arrangements: Alone Available Help at Discharge: Family;Available PRN/intermittently Type of Home: House Home Access: Stairs to enter Entrance Stairs-Rails: None Entrance Stairs-Number of Steps: 2 Home Layout: One level Home Equipment: Walker - 2 wheels;Shower seat;Cane - single point Additional Comments:  Per prior admission: wife had been primary caregiver/driver etc and she died August 11, 2022; he is "tired of being a burden to people"    Prior Function Level of Independence: Needs assistance   Gait / Transfers Assistance Needed: Could ambulate short distances in community holding grocery carts at stores, RW in home, cane for outside  ADL's /  Homemaking Assistance Needed: Independent wtih ADLs and IADLs  Comments: reports step-son lives in town and can assist intemittently     Hand Dominance        Extremity/Trunk Assessment   Upper Extremity Assessment Upper Extremity Assessment: Overall WFL for tasks assessed    Lower Extremity Assessment Lower Extremity Assessment: RLE  deficits/detail;LLE deficits/detail RLE Deficits / Details: ROM WFL, MMT 4+/5; + edema LLE Deficits / Details: ROM WFL, MMT 4+/5; + edema    Cervical / Trunk Assessment Cervical / Trunk Assessment: Normal  Communication   Communication: No difficulties  Cognition Arousal/Alertness: Awake/alert Behavior During Therapy: WFL for tasks assessed/performed Overall Cognitive Status: Within Functional Limits for tasks assessed                                        General Comments General comments (skin integrity, edema, etc.): Pt on 3 LPM O2 with sats 95% rest and 92% ambulation.    Exercises     Assessment/Plan    PT Assessment Patient needs continued PT services  PT Problem List Decreased strength;Decreased mobility;Decreased coordination;Decreased activity tolerance;Cardiopulmonary status limiting activity;Decreased balance;Decreased knowledge of use of DME       PT Treatment Interventions DME instruction;Therapeutic activities;Gait training;Therapeutic exercise;Patient/family education;Balance training;Functional mobility training;Stair training    PT Goals (Current goals can be found in the Care Plan section)  Acute Rehab PT Goals Patient Stated Goal: return home PT Goal Formulation: With patient Time For Goal Achievement: 12/08/19 Potential to Achieve Goals: Good    Frequency     Barriers to discharge        Co-evaluation               AM-PAC PT "6 Clicks" Mobility  Outcome Measure Help needed turning from your back to your side while in a flat bed without using bedrails?: None Help needed moving from lying on your back to sitting on the side of a flat bed without using bedrails?: None Help needed moving to and from a bed to a chair (including a wheelchair)?: A Little Help needed standing up from a chair using your arms (e.g., wheelchair or bedside chair)?: A Little Help needed to walk in hospital room?: A Little Help needed climbing 3-5 steps  with a railing? : A Little 6 Click Score: 20    End of Session Equipment Utilized During Treatment: Gait belt;Oxygen Activity Tolerance: Patient tolerated treatment well Patient left: in bed;with call bell/phone within reach;with bed alarm set Nurse Communication: Mobility status PT Visit Diagnosis: Other abnormalities of gait and mobility (R26.89);Muscle weakness (generalized) (M62.81)    Time: 6381-7711 PT Time Calculation (min) (ACUTE ONLY): 21 min   Charges:   PT Evaluation $PT Eval Moderate Complexity: 1 Melina Schools, PT Acute Rehab Services Pager [phone] [facility] [phone]    Karlton Lemon 11/24/2019, 2:47 PM

## 2019-11-24 NOTE — Plan of Care (Signed)
  Problem: Activity: Goal: Risk for activity intolerance will decrease Outcome: Progressing   Problem: Safety: Goal: Ability to remain free from injury will improve Outcome: Progressing   Problem: Skin Integrity: Goal: Risk for impaired skin integrity will decrease Outcome: Progressing   

## 2019-11-24 NOTE — Progress Notes (Signed)
[facility]  PROGRESS NOTE  Tyler Mcmillan PTW:[id number] DOB: [birth year] DOA: 11/20/2019 PCP: [name], MD   Brief HPI:   74 year old male with history of bladder cancer, abdominal attic and rhythm, chronic anxiety, alcoholism, GERD, hypertension, squamous cell carcinoma of the left lung was diagnosed previously in 2018.  Followed by Dr. [name], Dr. [name], Dr. [name].  He had stereotactic radiation therapy on 08/25/2017.  Patient appears to have lost follow-up since last year.  Came to ED with complaints of shortness of breath cough and feeling terrible.  BNP was elevated, CT and x-ray chest did not show overt CHF.  CTA chest showed small right pleural effusion with 3.5 cm x 2.7 cm mass in the left upper lobe, no evidence of PE.ABG done this morning showed PCO2 69.5, pH 7.302, PO2 77.9.    Subjective   Patient seen and examined, breathing is significantly improved.   Assessment/Plan:     1. Acute on chronic hypoxemic respiratory failure-improved, patient initially required 5 L/min of oxygen via nasal cannula,.  He has underlying diastolic CHF.   BNP was elevated to 1200.  Patient ran out of oral Lasix.  Patient also has pneumonia and postobstructive symptoms from lung cancer.  Patient was started on Lasix 40 mg IV every 12 hours.  He has diuresed well.  Lasix was discontinued and started on Lasix 40 mg p.o. daily. 2. Acute on chronic diastolic CHF-echocardiogram done today shows grade 2 diastolic dysfunction, EF 70%.  Started on Lasix as above.  BNP is elevated at 1200.  Foley catheter has been inserted as patient unable to get up to urinate due to ongoing worsening respiratory status.  Condom catheter could not be placed due to patient's anatomy.  IV Lasix has been discontinued.  Patient started on Lasix 40 mg p.o. daily. 3. Alcohol abuse-continue CIWA protocol. 4. Hypokalemia-potassium replete     COVID-19 Labs  No results for input(s): DDIMER, FERRITIN, LDH, CRP  in the last 72 hours.  Lab Results  Component Value Date   SARSCOV2NAA NEGATIVE 11/20/2019   [place] NEGATIVE 10/06/2019   [place] NEGATIVE 09/27/2019     Scheduled medications:   . (feeding supplement) PROSource Plus  30 mL Oral BID BM  . Chlorhexidine Gluconate Cloth  6 each Topical Daily  . enoxaparin (LOVENOX) injection  40 mg Subcutaneous Q24H  . feeding supplement  237 mL Oral Q24H  . folic acid  1 mg Oral Daily  . furosemide  40 mg Oral Daily  . guaiFENesin  600 mg Oral BID  . multivitamin with minerals  1 tablet Oral Daily  . oxyCODONE  20 mg Oral Q4H  . pantoprazole  40 mg Oral BID  . senna  1 tablet Oral Daily  . sodium chloride flush  3 mL Intravenous Q12H  . thiamine  100 mg Oral Daily   Or  . thiamine  100 mg Intravenous Daily         CBG: No results for input(s): GLUCAP in the last 168 hours.  SpO2: 96 % O2 Flow Rate (L/min): 3 L/min    CBC: Recent Labs  Lab 11/20/19 1547 11/21/19 0000 11/21/19 0527  WBC 8.3 10.0 9.0  NEUTROABS 7.1  --  7.6  HGB 9.7* 9.2* 9.0*  HCT 34.0* 32.6* 32.3*  MCV 85.4 85.6 86.6  PLT 300 265 017    Basic Metabolic Panel: Recent Labs  Lab 11/21/19 0000 11/21/19 0000 11/21/19 0527 11/22/19 0425 11/22/19 1456 11/23/19 0344 11/24/19 0518  NA 138   < >  139 140 139 139 138  K 3.9   < > 3.6 2.9* 3.8 3.2* 4.2  CL 97*   < > 98 96* 97* 97* 97*  CO2 32   < > 31 34* 34* 33* 32  GLUCOSE 106*   < > 95 91 102* 84 101*  BUN 17   < > 16 16 19 18 15   CREATININE 0.77   < > 0.79 0.74 0.80 0.79 0.74  CALCIUM 8.1*   < > 8.2* 8.0* 7.9* 8.0* 8.5*  MG 2.0  --   --   --   --   --   --   PHOS 3.0  --   --   --   --   --   --    < > = values in this interval not displayed.     Liver Function Tests: Recent Labs  Lab 11/20/19 1547 11/21/19 0000  AST 22 22  ALT 12 12  ALKPHOS 68 67  BILITOT 0.4 0.6  PROT 7.6 7.1  ALBUMIN 3.4* 3.2*     Antibiotics: Anti-infectives (From admission, onward)   None       DVT  prophylaxis: Lovenox  Code Status: Full code  Family Communication: No family at bedside    Status is: Inpatient  Dispo: The patient is from: Home              Anticipated d/c is to: Home              Anticipated d/c date is: 11/25/2019              Patient currently not medically stable for discharge  Barrier to discharge-ongoing treatment for acute on chronic hypoxemic respiratory failure  Pressure Injury 03/30/18 Stage I -  Intact skin with non-blanchable redness of a localized area usually over a bony prominence. (Active)  03/30/18 2000  Location: Sacrum  Location Orientation:   Staging: Stage I -  Intact skin with non-blanchable redness of a localized area usually over a bony prominence.  Wound Description (Comments):   Present on Admission: Yes         Consultants:    Procedures:     Objective   Vitals:   11/23/19 1300 11/23/19 2135 11/24/19 0642 11/24/19 1254  BP: (!) 147/72 138/66 140/69 134/66  Pulse: 74 72 72 72  Resp: 18 15 15 18   Temp: 98.3 F (36.8 C) 98.3 F (36.8 C) 98.8 F (37.1 C) 98.5 F (36.9 C)  TempSrc: Oral Oral  Oral  SpO2: 94% 93% 96% 96%  Weight:      Height:        Physical Examination:   General-appears in no acute distress  Heart-S1-S2, regular, no murmur auscultated  Lungs-clear to auscultation bilaterally, no wheezing or crackles auscultated  Abdomen-soft, nontender, no organomegaly  Extremities-no edema in the lower extremities  Neuro-alert, oriented x3, no focal deficit noted    Data Reviewed:   Recent Results (from the past 240 hour(s))  Respiratory Panel by RT PCR (Flu A&B, Covid) - Nasopharyngeal Swab     Status: None   Collection Time: 11/20/19  3:47 PM   Specimen: Nasopharyngeal Swab  Result Value Ref Range Status   SARS Coronavirus 2 by RT PCR NEGATIVE NEGATIVE Final    Comment: (NOTE) SARS-CoV-2 target nucleic acids are NOT DETECTED.  The SARS-CoV-2 RNA is generally detectable in upper  respiratoy specimens during the acute phase of infection. The lowest concentration of SARS-CoV-2 viral copies this assay  can detect is 131 copies/mL. A negative result does not preclude SARS-Cov-2 infection and should not be used as the sole basis for treatment or other patient management decisions. A negative result may occur with  improper specimen collection/handling, submission of specimen other than nasopharyngeal swab, presence of viral mutation(s) within the areas targeted by this assay, and inadequate number of viral copies (<131 copies/mL). A negative result must be combined with clinical observations, patient history, and epidemiological information. The expected result is Negative.  Fact Sheet for Patients:  PinkCheek.be  Fact Sheet for Healthcare Providers:  GravelBags.it  This test is no t yet approved or cleared by the [place] FDA and  has been authorized for detection and/or diagnosis of SARS-CoV-2 by FDA under an Emergency Use Authorization (EUA). This EUA will remain  in effect (meaning this test can be used) for the duration of the COVID-19 declaration under Section 564(b)(1) of the Act, 21 U.S.C. section 360bbb-3(b)(1), unless the authorization is terminated or revoked sooner.     Influenza A by PCR NEGATIVE NEGATIVE Final   Influenza B by PCR NEGATIVE NEGATIVE Final    Comment: (NOTE) The Xpert Xpress SARS-CoV-2/FLU/RSV assay is intended as an aid in  the diagnosis of influenza from Nasopharyngeal swab specimens and  should not be used as a sole basis for treatment. Nasal washings and  aspirates are unacceptable for Xpert Xpress SARS-CoV-2/FLU/RSV  testing.  Fact Sheet for Patients: PinkCheek.be  Fact Sheet for Healthcare Providers: GravelBags.it  This test is not yet approved or cleared by the [place] FDA and  has been  authorized for detection and/or diagnosis of SARS-CoV-2 by  FDA under an Emergency Use Authorization (EUA). This EUA will remain  in effect (meaning this test can be used) for the duration of the  Covid-19 declaration under Section 564(b)(1) of the Act, 21  U.S.C. section 360bbb-3(b)(1), unless the authorization is  terminated or revoked. Performed at [facility], [place] [address], [place], [place] [address]   Blood Culture (routine x 2)     Status: None (Preliminary result)   Collection Time: 11/20/19  3:47 PM   Specimen: BLOOD  Result Value Ref Range Status   Specimen Description   Final    BLOOD LEFT ANTECUBITAL Performed at [facility] [address] [place], [place] [address]    Special Requests   Final    BOTTLES DRAWN AEROBIC AND ANAEROBIC Blood Culture results may not be optimal due to an excessive volume of blood received in culture bottles Performed at [facility] [address], [place], [place] [address]    Culture   Final    NO GROWTH 4 DAYS Performed at [facility], [place] [address] [place], [place] [address]    Report Status PENDING  Incomplete    No results for input(s): LIPASE, AMYLASE in the last 168 hours. No results for input(s): AMMONIA in the last 168 hours.  Cardiac Enzymes: No results for input(s): CKTOTAL, CKMB, CKMBINDEX, TROPONINI in the last 168 hours. BNP (last 3 results) Recent Labs    09/27/19 0403 11/20/19 1547  BNP 609.5* 1,251.9*    ProBNP (last 3 results) No results for input(s): PROBNP in the last 8760 hours.  Studies:  No results found.     Oswald Hillock   [facility] If 7PM-7AM, please contact night-coverage at [email or web address], Office  [phone]   11/24/2019, 5:38 PM  LOS: 4 days

## 2019-11-25 DIAGNOSIS — J9621 Acute and chronic respiratory failure with hypoxia: Secondary | ICD-10-CM | POA: Diagnosis not present

## 2019-11-25 LAB — CULTURE, BLOOD (ROUTINE X 2): Culture: NO GROWTH

## 2019-11-25 LAB — BASIC METABOLIC PANEL
Anion gap: 10 (ref 5–15)
BUN: 14 mg/dL (ref 8–23)
CO2: 33 mmol/L — ABNORMAL HIGH (ref 22–32)
Calcium: 8.7 mg/dL — ABNORMAL LOW (ref 8.9–10.3)
Chloride: 96 mmol/L — ABNORMAL LOW (ref 98–111)
Creatinine, Ser: 0.79 mg/dL (ref 0.61–1.24)
GFR, Estimated: >60 mL/min (ref >60)
Glucose, Bld: 119 mg/dL — ABNORMAL HIGH (ref 70–99)
Potassium: 3.9 mmol/L (ref 3.5–5.1)
Sodium: 139 mmol/L (ref 135–145)

## 2019-11-25 NOTE — Progress Notes (Signed)
[facility]  PROGRESS NOTE  Tyler Mcmillan YNW:[id number] DOB: 1945/10/25 DOA: 11/20/2019 PCP: [name], MD   Brief HPI:   74 year old male with history of bladder cancer, abdominal attic and rhythm, chronic anxiety, alcoholism, GERD, hypertension, squamous cell carcinoma of the left lung was diagnosed previously in 2018.  Followed by Dr. [name], Dr. [name], Dr. [name].  He had stereotactic radiation therapy on 08/25/2017.  Patient appears to have lost follow-up since last year.  Came to ED with complaints of shortness of breath cough and feeling terrible.  BNP was elevated, CT and x-ray chest did not show overt CHF.  CTA chest showed small right pleural effusion with 3.5 cm x 2.7 cm mass in the left upper lobe, no evidence of PE.ABG done this morning showed PCO2 69.5, pH 7.302, PO2 77.9.    Subjective   Patient seen and examined, denies any complaints.   Assessment/Plan:     1. Acute on chronic hypoxemic respiratory failure-improved, patient initially required 5 L/min of oxygen via nasal cannula,.  He has underlying diastolic CHF.   BNP was elevated to 1200.  Patient ran out of oral Lasix.  Patient also has pneumonia and postobstructive symptoms from lung cancer.  Patient was started on Lasix 40 mg IV every 12 hours.  He has diuresed well.  IV Lasix was discontinued and started on Lasix 40 mg p.o. daily. 2. Acute on chronic diastolic CHF-echocardiogram done today shows grade 2 diastolic dysfunction, EF 65%.  Started on Lasix as above.  BNP is elevated at 1200.  Foley catheter has been inserted as patient unable to get up to urinate due to ongoing worsening respiratory status.  Condom catheter could not be placed due to patient's anatomy.  IV Lasix has been discontinued.  Patient started on Lasix 40 mg p.o. daily. 3. Alcohol abuse-continue CIWA protocol. 4. Hypokalemia-potassium replete     COVID-19 Labs  No results for input(s): DDIMER, FERRITIN, LDH, CRP in the last  72 hours.  Lab Results  Component Value Date   SARSCOV2NAA NEGATIVE 11/20/2019   [place] NEGATIVE 10/06/2019   [place] NEGATIVE 09/27/2019     Scheduled medications:   . (feeding supplement) PROSource Plus  30 mL Oral BID BM  . Chlorhexidine Gluconate Cloth  6 each Topical Daily  . enoxaparin (LOVENOX) injection  40 mg Subcutaneous Q24H  . feeding supplement  237 mL Oral Q24H  . folic acid  1 mg Oral Daily  . furosemide  40 mg Oral Daily  . guaiFENesin  600 mg Oral BID  . multivitamin with minerals  1 tablet Oral Daily  . oxyCODONE  20 mg Oral Q4H  . pantoprazole  40 mg Oral BID  . senna  1 tablet Oral Daily  . sodium chloride flush  3 mL Intravenous Q12H  . thiamine  100 mg Oral Daily   Or  . thiamine  100 mg Intravenous Daily         CBG: No results for input(s): GLUCAP in the last 168 hours.  SpO2: 95 % O2 Flow Rate (L/min): 3 L/min    CBC: Recent Labs  Lab 11/20/19 1547 11/21/19 0000 11/21/19 0527  WBC 8.3 10.0 9.0  NEUTROABS 7.1  --  7.6  HGB 9.7* 9.2* 9.0*  HCT 34.0* 32.6* 32.3*  MCV 85.4 85.6 86.6  PLT 300 265 784    Basic Metabolic Panel: Recent Labs  Lab 11/21/19 0000 11/21/19 0527 11/22/19 0425 11/22/19 1456 11/23/19 0344 11/24/19 0518 11/25/19 0429  NA 138   < >  140 139 139 138 139  K 3.9   < > 2.9* 3.8 3.2* 4.2 3.9  CL 97*   < > 96* 97* 97* 97* 96*  CO2 32   < > 34* 34* 33* 32 33*  GLUCOSE 106*   < > 91 102* 84 101* 119*  BUN 17   < > 16 19 18 15 14   CREATININE 0.77   < > 0.74 0.80 0.79 0.74 0.79  CALCIUM 8.1*   < > 8.0* 7.9* 8.0* 8.5* 8.7*  MG 2.0  --   --   --   --   --   --   PHOS 3.0  --   --   --   --   --   --    < > = values in this interval not displayed.     Liver Function Tests: Recent Labs  Lab 11/20/19 1547 11/21/19 0000  AST 22 22  ALT 12 12  ALKPHOS 68 67  BILITOT 0.4 0.6  PROT 7.6 7.1  ALBUMIN 3.4* 3.2*     Antibiotics: Anti-infectives (From admission, onward)   None       DVT  prophylaxis: Lovenox  Code Status: Full code  Family Communication: No family at bedside    Status is: Inpatient  Dispo: The patient is from: Home              Anticipated d/c is to: Home              Anticipated d/c date is: 11/26/2019              Patient currently not medically stable for discharge  Barrier to discharge-ongoing treatment for acute on chronic hypoxemic respiratory failure  Pressure Injury 03/30/18 Stage I -  Intact skin with non-blanchable redness of a localized area usually over a bony prominence. (Active)  03/30/18 2000  Location: Sacrum  Location Orientation:   Staging: Stage I -  Intact skin with non-blanchable redness of a localized area usually over a bony prominence.  Wound Description (Comments):   Present on Admission: Yes         Consultants:    Procedures:     Objective   Vitals:   11/24/19 1254 11/24/19 2107 11/25/19 0357 11/25/19 1219  BP: 134/66 (!) 144/67 (!) 153/63 (!) 152/75  Pulse: 72 74 83 83  Resp: 18 20 20 18   Temp: 98.5 F (36.9 C) 98.9 F (37.2 C) 98.3 F (36.8 C) 99.2 F (37.3 C)  TempSrc: Oral Oral Oral Oral  SpO2: 96% 96% 91% 95%  Weight:      Height:        Physical Examination:  General-appears in no acute distress Heart-S1-S2, regular, no murmur auscultated Lungs-clear to auscultation bilaterally, no wheezing or crackles auscultated Abdomen-soft, nontender, no organomegaly Extremities-no edema in the lower extremities Neuro-alert, oriented x3, no focal deficit noted   Data Reviewed:   Recent Results (from the past 240 hour(s))  Respiratory Panel by RT PCR (Flu A&B, Covid) - Nasopharyngeal Swab     Status: None   Collection Time: 11/20/19  3:47 PM   Specimen: Nasopharyngeal Swab  Result Value Ref Range Status   SARS Coronavirus 2 by RT PCR NEGATIVE NEGATIVE Final    Comment: (NOTE) SARS-CoV-2 target nucleic acids are NOT DETECTED.  The SARS-CoV-2 RNA is generally detectable in upper  respiratoy specimens during the acute phase of infection. The lowest concentration of SARS-CoV-2 viral copies this assay can detect is 131 copies/mL.  A negative result does not preclude SARS-Cov-2 infection and should not be used as the sole basis for treatment or other patient management decisions. A negative result may occur with  improper specimen collection/handling, submission of specimen other than nasopharyngeal swab, presence of viral mutation(s) within the areas targeted by this assay, and inadequate number of viral copies (<131 copies/mL). A negative result must be combined with clinical observations, patient history, and epidemiological information. The expected result is Negative.  Fact Sheet for Patients:  PinkCheek.be  Fact Sheet for Healthcare Providers:  GravelBags.it  This test is no t yet approved or cleared by the [place] FDA and  has been authorized for detection and/or diagnosis of SARS-CoV-2 by FDA under an Emergency Use Authorization (EUA). This EUA will remain  in effect (meaning this test can be used) for the duration of the COVID-19 declaration under Section 564(b)(1) of the Act, 21 U.S.C. section 360bbb-3(b)(1), unless the authorization is terminated or revoked sooner.     Influenza A by PCR NEGATIVE NEGATIVE Final   Influenza B by PCR NEGATIVE NEGATIVE Final    Comment: (NOTE) The Xpert Xpress SARS-CoV-2/FLU/RSV assay is intended as an aid in  the diagnosis of influenza from Nasopharyngeal swab specimens and  should not be used as a sole basis for treatment. Nasal washings and  aspirates are unacceptable for Xpert Xpress SARS-CoV-2/FLU/RSV  testing.  Fact Sheet for Patients: PinkCheek.be  Fact Sheet for Healthcare Providers: GravelBags.it  This test is not yet approved or cleared by the [place] FDA and  has been  authorized for detection and/or diagnosis of SARS-CoV-2 by  FDA under an Emergency Use Authorization (EUA). This EUA will remain  in effect (meaning this test can be used) for the duration of the  Covid-19 declaration under Section 564(b)(1) of the Act, 21  U.S.C. section 360bbb-3(b)(1), unless the authorization is  terminated or revoked. Performed at [facility], [place] [address], [place], [place] [address]   Blood Culture (routine x 2)     Status: None   Collection Time: 11/20/19  3:47 PM   Specimen: BLOOD  Result Value Ref Range Status   Specimen Description   Final    BLOOD LEFT ANTECUBITAL Performed at [facility] [address] [place], [place] [address]    Special Requests   Final    BOTTLES DRAWN AEROBIC AND ANAEROBIC Blood Culture results may not be optimal due to an excessive volume of blood received in culture bottles Performed at [facility] [address], [place], [place] [address]    Culture   Final    NO GROWTH 5 DAYS Performed at [facility], [place] [address], [place], [place] [address]    Report Status 11/25/2019 FINAL  Final    No results for input(s): LIPASE, AMYLASE in the last 168 hours. No results for input(s): AMMONIA in the last 168 hours.  Cardiac Enzymes: No results for input(s): CKTOTAL, CKMB, CKMBINDEX, TROPONINI in the last 168 hours. BNP (last 3 results) Recent Labs    09/27/19 0403 11/20/19 1547  BNP 609.5* 1,251.9*    ProBNP (last 3 results) No results for input(s): PROBNP in the last 8760 hours.  Studies:  No results found.     Oswald Hillock   [facility] If 7PM-7AM, please contact night-coverage at [email or web address], Office  [phone]   11/25/2019, 3:53 PM  LOS: 5 days

## 2019-11-26 DIAGNOSIS — C3412 Malignant neoplasm of upper lobe, left bronchus or lung: Secondary | ICD-10-CM | POA: Diagnosis not present

## 2019-11-26 DIAGNOSIS — J9621 Acute and chronic respiratory failure with hypoxia: Secondary | ICD-10-CM | POA: Diagnosis not present

## 2019-11-26 DIAGNOSIS — F1010 Alcohol abuse, uncomplicated: Secondary | ICD-10-CM | POA: Diagnosis not present

## 2019-11-26 NOTE — Progress Notes (Addendum)
Physical Therapy Treatment Patient Details Name: Tyler Mcmillan MRN: [id number] DOB: 26-Oct-1945 Today's Date: 11/26/2019    History of Present Illness Pt is 74 yo male with hx of bladder CA, anxiety, alcoholism, GERD, HTN, Squamous cell L lung CA. He presented with shortness of breath and cough.  Pt found to have small R pleural effusion and acute on chronic resp failure likely from underlying CHF.    PT Comments    Pt assisted with ambulating in hallway.  Pt mildly unsteady today and had 2 instances of LOB requiring min assist.  Pt reports some numbness and tingling in lower legs as well.  D/C recommendations updated to SNF as pt reports difficulty managing at home and requiring more assist today.     Follow Up Recommendations  SNF;Supervision/Assistance - 24 hour     Equipment Recommendations  None recommended by PT    Recommendations for Other Services       Precautions / Restrictions Precautions Precautions: Fall Precaution Comments: monitor sats    Mobility  Bed Mobility Overal bed mobility: Needs Assistance Bed Mobility: Supine to Sit;Sit to Supine     Supine to sit: Min guard Sit to supine: Min guard   General bed mobility comments: assist with lines/leads  Transfers Overall transfer level: Needs assistance Equipment used: Rolling walker (2 wheeled) Transfers: Sit to/from Stand Sit to Stand: Min guard;Min assist         General transfer comment: verbal cues for safe technique, pt with uncontrolled descent  Ambulation/Gait Ambulation/Gait assistance: Min assist Gait Distance (Feet): 100 Feet Assistive device: Rolling walker (2 wheeled) Gait Pattern/deviations: Decreased stride length;Shuffle;Step-through pattern     General Gait Details: pt remained on 3L O2 [place] and SPO2 92% upon return to room, pt with 2 instances of LOB   Stairs             Wheelchair Mobility    Modified Rankin (Stroke Patients Only)       Balance            Standing balance support: Bilateral upper extremity supported Standing balance-Leahy Scale: Poor                              Cognition Arousal/Alertness: Awake/alert Behavior During Therapy: WFL for tasks assessed/performed Overall Cognitive Status: Within Functional Limits for tasks assessed                                        Exercises      General Comments        Pertinent Vitals/Pain Pain Assessment: Faces Faces Pain Scale: Hurts a little bit Pain Location: generalized Pain Descriptors / Indicators: Grimacing;Discomfort Pain Intervention(s): Monitored during session;Repositioned    Home Living                      Prior Function            PT Goals (current goals can now be found in the care plan section) Progress towards PT goals: Progressing toward goals    Frequency    Min 2X/week      PT Plan Discharge plan needs to be updated    Co-evaluation              AM-PAC PT "6 Clicks" Mobility   Outcome Measure  Help needed turning from  your back to your side while in a flat bed without using bedrails?: None Help needed moving from lying on your back to sitting on the side of a flat bed without using bedrails?: None Help needed moving to and from a bed to a chair (including a wheelchair)?: A Little Help needed standing up from a chair using your arms (e.g., wheelchair or bedside chair)?: A Little Help needed to walk in hospital room?: A Little Help needed climbing 3-5 steps with a railing? : A Little 6 Click Score: 20    End of Session Equipment Utilized During Treatment: Gait belt;Oxygen Activity Tolerance: Patient tolerated treatment well Patient left: in bed;with call bell/phone within reach;with bed alarm set Nurse Communication: Mobility status PT Visit Diagnosis: Other abnormalities of gait and mobility (R26.89);Muscle weakness (generalized) (M62.81)     Time: 1610-9604 PT Time Calculation  (min) (ACUTE ONLY): 15 min  Charges:  $Gait Training: 8-22 mins                     Arlyce Dice, DPT Acute Rehabilitation Services Pager: [phone] Office: [phone]  [place] Ram E 11/26/2019, 12:18 PM

## 2019-11-26 NOTE — Plan of Care (Signed)

## 2019-11-26 NOTE — TOC Progression Note (Signed)
Transition of Care [facility]) - Progression Note    Patient Details  Name: Tyler Mcmillan MRN: [id number] Date of Birth: 31-May-1945  [facility]) CM/SW Contact  Ross Ludwig, [place] Phone Number: 11/26/2019, 5:40 PM  Clinical Narrative:     Patient requesting to go to SNF for short term rehab.  CSW faxed patient's information out and started insurance authorization reference number [id number].  Waiting for bed offers and insurance approval.        Expected Discharge Plan and Services                                                 Social Determinants of Health (SDOH) Interventions    Readmission Risk Interventions Readmission Risk Prevention Plan 10/12/2019  Transportation Screening Complete  PCP or Specialist Appt within 5-7 Days Complete  Home Care Screening Complete  Medication Review (RN CM) Complete  Some recent data might be hidden

## 2019-11-26 NOTE — NC FL2 (Signed)
[place] LEVEL OF CARE SCREENING TOOL     IDENTIFICATION  Patient Name: Tyler Mcmillan Birthdate: [birth year] Sex: male Admission Date (Current Location): 11/20/2019  [facility] and [place] Number:  Herbalist and Address:  [facility],  [address], [place]      Provider Number: [id number]  Attending Physician Name and Address:  Oswald Hillock, MD  Relative Name and Phone Number:  Phifer,David Relative [phone] or Phifer,David Relative [phone]    Current Level of Care: Hospital Recommended Level of Care: [place] Prior Approval Number:    Date Approved/Denied:   PASRR Number: [id number]  Discharge Plan: SNF    Current Diagnoses: Patient Active Problem List   Diagnosis Date Noted  . Acute on chronic respiratory failure with hypoxemia ([place]) 11/20/2019  . Suicide attempt by substance overdose ([place]) 10/07/2019  . Hypokalemia 10/02/2019  . Other cirrhosis of liver ([place]) 09/30/2019  . Aspiration pneumonia ([place]) 09/28/2019  . Acute respiratory failure with hypoxia ([place]) 09/27/2019  . Intentional overdose of drug in tablet form ([place]) 09/27/2019  . Alcohol abuse 09/27/2019  . Tobacco dependence 09/27/2019  . Acute on chronic respiratory failure with hypoxia ([place]) 04/06/2018  . Malnutrition of moderate degree 04/05/2018  . Pressure injury of skin 04/05/2018  . GI bleed 03/31/2018  . Acute post-hemorrhagic anemia   . Malignant neoplasm metastatic to left lung ([place])   . Peripheral edema   . Pleural effusion on left   . Pleural effusion, left   . Fall at home, initial encounter   . Generalized weakness   . Acute GI bleeding 03/30/2018  . 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 Hypertensive heart disease without heart failure   . Chest pain 07/02/2016  . Cavitary lesion of lung 07/02/2016  . Unstable angina ([place])   . AAA (abdominal aortic aneurysm) ([place])  07/09/2014  . Lumbar degenerative disc disease 07/09/2014    Orientation RESPIRATION BLADDER Height & Weight     Time, Situation, Place, Self  O2 (3L) Continent Weight: 180 lb 3.2 oz (81.7 kg) Height:  5\' 10"  (177.8 cm)  BEHAVIORAL SYMPTOMS/MOOD NEUROLOGICAL BOWEL NUTRITION STATUS      Continent Diet (Cardiac)  AMBULATORY STATUS COMMUNICATION OF NEEDS Skin   Limited Assist Verbally Normal                       Personal Care Assistance Level of Assistance  Bathing, Feeding, Dressing Bathing Assistance: Limited assistance Feeding assistance: Independent Dressing Assistance: Limited assistance     Functional Limitations Info  Sight, Speech, Hearing Sight Info: Adequate Hearing Info: Adequate Speech Info: Adequate    SPECIAL CARE FACTORS FREQUENCY  PT (By licensed PT), OT (By licensed OT)     PT Frequency: Minimum 5x a week OT Frequency: Minimum 5x a week            Contractures Contractures Info: Not present    Additional Factors Info  Code Status, Allergies Code Status Info: Full Code Allergies Info: NKA           Current Medications (11/26/2019):  This is the current hospital active medication list Current Facility-Administered Medications  Medication Dose Route Frequency Provider Last Rate Last Admin  . (feeding supplement) PROSource Plus liquid 30 mL  30 mL Oral BID BM Oswald Hillock, MD   30 mL at 11/24/19 2009  . 0.9 %  sodium chloride infusion  250 mL  Intravenous PRN Elwyn Reach, MD      . acetaminophen (TYLENOL) tablet 650 mg  650 mg Oral Q4H PRN Elwyn Reach, MD   650 mg at 11/25/19 1822  . Chlorhexidine Gluconate Cloth 2 % PADS 6 each  6 each Topical Daily Oswald Hillock, MD   6 each at 11/26/19 0915  . clonazePAM (KLONOPIN) tablet 1 mg  1 mg Oral TID PRN Elwyn Reach, MD   1 mg at 11/25/19 2155  . enoxaparin (LOVENOX) injection 40 mg  40 mg Subcutaneous Q24H Gala Romney L, MD   40 mg at 11/26/19 0913  . feeding supplement  (ENSURE ENLIVE / ENSURE PLUS) liquid 237 mL  237 mL Oral Q24H [place], Gagan S, MD      . folic acid (FOLVITE) tablet 1 mg  1 mg Oral Daily Gala Romney L, MD   1 mg at 11/26/19 0914  . furosemide (LASIX) tablet 40 mg  40 mg Oral Daily Oswald Hillock, MD   40 mg at 11/26/19 0914  . guaiFENesin (MUCINEX) 12 hr tablet 600 mg  600 mg Oral BID Oswald Hillock, MD   600 mg at 11/26/19 0914  . magic mouthwash w/lidocaine  10 mL Oral QID PRN Oswald Hillock, MD   10 mL at 11/23/19 1545  . multivitamin with minerals tablet 1 tablet  1 tablet Oral Daily Elwyn Reach, MD   1 tablet at 11/26/19 0914  . ondansetron (ZOFRAN) injection 4 mg  4 mg Intravenous Q6H PRN Gala Romney L, MD      . oxyCODONE (Oxy IR/ROXICODONE) immediate release tablet 20 mg  20 mg Oral Q4H Gala Romney L, MD   20 mg at 11/26/19 1523  . pantoprazole (PROTONIX) EC tablet 40 mg  40 mg Oral BID Elwyn Reach, MD   40 mg at 11/26/19 0914  . senna (SENOKOT) tablet 8.6 mg  1 tablet Oral Daily Gala Romney L, MD   8.6 mg at 11/26/19 0914  . sodium chloride flush (NS) 0.9 % injection 3 mL  3 mL Intravenous Q12H Gala Romney L, MD   3 mL at 11/26/19 0915  . sodium chloride flush (NS) 0.9 % injection 3 mL  3 mL Intravenous PRN Gala Romney L, MD      . thiamine tablet 100 mg  100 mg Oral Daily Gala Romney L, MD   100 mg at 11/26/19 3149   Or  . thiamine (B-1) injection 100 mg  100 mg Intravenous Daily Elwyn Reach, MD         Discharge Medications: Please see discharge summary for a list of discharge medications.  Relevant Imaging Results:  Relevant Lab Results:   Additional Information SSN [id number]  Ross Ludwig, LCSW

## 2019-11-26 NOTE — Progress Notes (Signed)
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  Followed by Dr. [name], Dr. [name], Dr. [name].  He had stereotactic radiation therapy on 08/25/2017.  Patient appears to have lost follow-up since last year.  Came to ED with complaints of shortness of breath cough and feeling terrible.  BNP was elevated, CT and x-ray chest did not show overt CHF.  CTA chest showed small right pleural effusion with 3.5 cm x 2.7 cm mass in the left upper lobe, no evidence of PE.ABG done this morning showed PCO2 69.5, pH 7.302, PO2 77.9.    Subjective   Patient seen and examined, he became short of breath on ambulation to bathroom yesterday.  Feels that he may not be able to go home.   Assessment/Plan:     1. Acute on chronic hypoxemic respiratory failure-improved, patient initially required 5 L/min of oxygen via nasal cannula,.  He has underlying diastolic CHF.   BNP was elevated to 1200.  Patient ran out of oral Lasix.  Patient also has pneumonia and postobstructive symptoms from lung cancer.  Patient was started on Lasix 40 mg IV every 12 hours.  He has diuresed well.  IV Lasix was discontinued and started on Lasix 40 mg p.o. daily. 2. Acute on chronic diastolic CHF-echocardiogram done today shows grade 2 diastolic dysfunction, EF 76%.  Started on Lasix as above.  BNP is elevated at 1200.  Foley catheter has been inserted as patient unable to get up to urinate due to ongoing worsening respiratory status.  Condom catheter could not be placed due to patient's anatomy.  IV Lasix has been discontinued.  Patient started on Lasix 40 mg p.o. daily. 3. Alcohol abuse-continue CIWA protocol. 4. Hypokalemia-potassium  replete. 5. Disposition-PT evaluation obtained today again, recommend skilled nursing facility.  Will obtain [facility] consultation for skilled nursing facility for rehab.     COVID-19 Labs  No results for input(s): DDIMER, FERRITIN, LDH, CRP in the last 72 hours.  Lab Results  Component Value Date   SARSCOV2NAA NEGATIVE 11/20/2019   [place] NEGATIVE 10/06/2019   [place] NEGATIVE 09/27/2019     Scheduled medications:   . (feeding supplement) PROSource Plus  30 mL Oral BID BM  . Chlorhexidine Gluconate Cloth  6 each Topical Daily  . enoxaparin (LOVENOX) injection  40 mg Subcutaneous Q24H  . feeding supplement  237 mL Oral Q24H  . folic acid  1 mg Oral Daily  . furosemide  40 mg Oral Daily  . guaiFENesin  600 mg Oral BID  . multivitamin with minerals  1 tablet Oral Daily  . oxyCODONE  20 mg Oral Q4H  . pantoprazole  40 mg Oral BID  . senna  1 tablet Oral Daily  . sodium chloride flush  3 mL Intravenous Q12H  . thiamine  100 mg Oral Daily   Or  . thiamine  100 mg Intravenous Daily         CBG: No results for input(s): GLUCAP in the last 168 hours.  SpO2: 94 % O2 Flow Rate (L/min): 3 L/min    CBC: Recent Labs  Lab 11/20/19 1547 11/21/19 0000 11/21/19 0527  WBC 8.3 10.0 9.0  NEUTROABS 7.1  --  7.6  HGB 9.7* 9.2* 9.0*  HCT 34.0*  32.6* 32.3*  MCV 85.4 85.6 86.6  PLT 300 265 154    Basic Metabolic Panel: Recent Labs  Lab 11/21/19 0000 11/21/19 0527 11/22/19 0425 11/22/19 1456 11/23/19 0344 11/24/19 0518 11/25/19 0429  NA 138   < > 140 139 139 138 139  K 3.9   < > 2.9* 3.8 3.2* 4.2 3.9  CL 97*   < > 96* 97* 97* 97* 96*  CO2 32   < > 34* 34* 33* 32 33*  GLUCOSE 106*   < > 91 102* 84 101* 119*  BUN 17   < > 16 19 18 15 14   CREATININE 0.77   < > 0.74 0.80 0.79 0.74 0.79  CALCIUM 8.1*   < > 8.0* 7.9* 8.0* 8.5* 8.7*  MG 2.0  --   --   --   --   --   --   PHOS 3.0  --   --   --   --   --   --    < > = values in this interval not displayed.      Liver Function Tests: Recent Labs  Lab 11/20/19 1547 11/21/19 0000  AST 22 22  ALT 12 12  ALKPHOS 68 67  BILITOT 0.4 0.6  PROT 7.6 7.1  ALBUMIN 3.4* 3.2*     Antibiotics: Anti-infectives (From admission, onward)   None       DVT prophylaxis: Lovenox  Code Status: Full code  Family Communication: No family at bedside    Status is: Inpatient  Dispo: The patient is from: Home              Anticipated d/c is to: Skilled nursing facility              Anticipated d/c date is: 11/28/19              Patient currently not medically stable for discharge  Barrier to discharge-ongoing treatment for acute on chronic hypoxemic respiratory failure  Pressure Injury 03/30/18 Stage I -  Intact skin with non-blanchable redness of a localized area usually over a bony prominence. (Active)  03/30/18 2000  Location: Sacrum  Location Orientation:   Staging: Stage I -  Intact skin with non-blanchable redness of a localized area usually over a bony prominence.  Wound Description (Comments):   Present on Admission: Yes         Consultants:    Procedures:     Objective   Vitals:   11/25/19 2000 11/25/19 2019 11/26/19 0453 11/26/19 1241  BP:  137/65 (!) 144/66 133/68  Pulse:  99 89 92  Resp: 19  20 20   Temp:  98.7 F (37.1 C) 98.9 F (37.2 C) 98.2 F (36.8 C)  TempSrc:  Oral Oral Oral  SpO2:  94% 93% 94%  Weight:   81.7 kg   Height:        Physical Examination:  General-appears in no acute distress Heart-S1-S2, regular, no murmur auscultated Lungs-clear to auscultation bilaterally, no wheezing or crackles auscultated Abdomen-soft, nontender, no organomegaly Extremities-no edema in the lower extremities Neuro-alert, oriented x3, no focal deficit noted  Data Reviewed:   Recent Results (from the past 240 hour(s))  Respiratory Panel by RT PCR (Flu A&B, Covid) - Nasopharyngeal Swab     Status: None   Collection Time: 11/20/19  3:47 PM   Specimen:  Nasopharyngeal Swab  Result Value Ref Range Status   SARS Coronavirus 2 by RT PCR NEGATIVE NEGATIVE Final  Comment: (NOTE) SARS-CoV-2 target nucleic acids are NOT DETECTED.  The SARS-CoV-2 RNA is generally detectable in upper respiratoy specimens during the acute phase of infection. The lowest concentration of SARS-CoV-2 viral copies this assay can detect is 131 copies/mL. A negative result does not preclude SARS-Cov-2 infection and should not be used as the sole basis for treatment or other patient management decisions. A negative result may occur with  improper specimen collection/handling, submission of specimen other than nasopharyngeal swab, presence of viral mutation(s) within the areas targeted by this assay, and inadequate number of viral copies (<131 copies/mL). A negative result must be combined with clinical observations, patient history, and epidemiological information. The expected result is Negative.  Fact Sheet for Patients:  PinkCheek.be  Fact Sheet for Healthcare Providers:  GravelBags.it  This test is no t yet approved or cleared by the [place] FDA and  has been authorized for detection and/or diagnosis of SARS-CoV-2 by FDA under an Emergency Use Authorization (EUA). This EUA will remain  in effect (meaning this test can be used) for the duration of the COVID-19 declaration under Section 564(b)(1) of the Act, 21 U.S.C. section 360bbb-3(b)(1), unless the authorization is terminated or revoked sooner.     Influenza A by PCR NEGATIVE NEGATIVE Final   Influenza B by PCR NEGATIVE NEGATIVE Final    Comment: (NOTE) The Xpert Xpress SARS-CoV-2/FLU/RSV assay is intended as an aid in  the diagnosis of influenza from Nasopharyngeal swab specimens and  should not be used as a sole basis for treatment. Nasal washings and  aspirates are unacceptable for Xpert Xpress SARS-CoV-2/FLU/RSV  testing.  Fact Sheet  for Patients: PinkCheek.be  Fact Sheet for Healthcare Providers: GravelBags.it  This test is not yet approved or cleared by the [place] FDA and  has been authorized for detection and/or diagnosis of SARS-CoV-2 by  FDA under an Emergency Use Authorization (EUA). This EUA will remain  in effect (meaning this test can be used) for the duration of the  Covid-19 declaration under Section 564(b)(1) of the Act, 21  U.S.C. section 360bbb-3(b)(1), unless the authorization is  terminated or revoked. Performed at [facility], [place] [address], [place], [place] [address]   Blood Culture (routine x 2)     Status: None   Collection Time: 11/20/19  3:47 PM   Specimen: BLOOD  Result Value Ref Range Status   Specimen Description   Final    BLOOD LEFT ANTECUBITAL Performed at [facility] [address], [place], [place] [address]    Special Requests   Final    BOTTLES DRAWN AEROBIC AND ANAEROBIC Blood Culture results may not be optimal due to an excessive volume of blood received in culture bottles Performed at [facility] [address], [place], [place] [address]    Culture   Final    NO GROWTH 5 DAYS Performed at [facility], [place] [address] [place], [place] [address]    Report Status 11/25/2019 FINAL  Final    BNP (last 3 results) Recent Labs    09/27/19 0403 11/20/19 1547  BNP 609.5* 1,251.9*        [place]   [facility] If 7PM-7AM, please contact night-coverage at [email or web address], Office  [phone]   11/26/2019, 2:36 PM  LOS: 6 days

## 2019-11-26 NOTE — Care Management Important Message (Signed)
Important Message  Patient Details IM Letter given to the Patient Name: Tyler Mcmillan MRN: [id number] Date of Birth: 1945-08-21   Medicare Important Message Given:  Yes     Kerin Salen 11/26/2019, 10:52 AM

## 2019-11-27 DIAGNOSIS — J9621 Acute and chronic respiratory failure with hypoxia: Secondary | ICD-10-CM | POA: Diagnosis not present

## 2019-11-27 DIAGNOSIS — C3412 Malignant neoplasm of upper lobe, left bronchus or lung: Secondary | ICD-10-CM | POA: Diagnosis not present

## 2019-11-27 DIAGNOSIS — F1010 Alcohol abuse, uncomplicated: Secondary | ICD-10-CM | POA: Diagnosis not present

## 2019-11-27 LAB — CREATININE, SERUM
Creatinine, Ser: 0.94 mg/dL (ref 0.61–1.24)
GFR, Estimated: >60 mL/min (ref >60)

## 2019-11-27 NOTE — Plan of Care (Signed)

## 2019-11-27 NOTE — Progress Notes (Addendum)
[facility]  PROGRESS NOTE  Tyler Mcmillan DGL:[id number] DOB: [birth year] DOA: 11/20/2019 PCP: [name], MD   Brief HPI:   74 year old male with history of bladder cancer, abdominal attic and rhythm, chronic anxiety, alcoholism, GERD, hypertension, squamous cell carcinoma of the left lung was diagnosed previously in 2018.  Followed by Dr. [name], Dr. [name], Dr. [name].  He had stereotactic radiation therapy on 08/25/2017.  Patient appears to have lost follow-up since last year.  Came to ED with complaints of shortness of breath cough and feeling terrible.  BNP was elevated, CT and x-ray chest did not show overt CHF.  CTA chest showed small right pleural effusion with 3.5 cm x 2.7 cm mass in the left upper lobe, no evidence of PE.ABG done this morning showed PCO2 69.5, pH 7.302, PO2 77.9.    Subjective   Patient seen and examined, denies any new complaints.  Awaiting bed at skilled nursing facility.   Assessment/Plan:     1. Acute on chronic hypoxemic respiratory failure-improved, currently at baseline.  Patient initially required 5 L/min of oxygen via nasal cannula,.  He has underlying diastolic CHF.   BNP was elevated to 1200.  Patient ran out of oral Lasix.  Patient also has pneumonia and postobstructive symptoms from lung cancer.  Patient was started on Lasix 40 mg IV every 12 hours.  He has diuresed well.  IV Lasix was discontinued and started on Lasix 40 mg p.o. daily. 2. Acute on chronic diastolic CHF-echocardiogram done today shows grade 2 diastolic dysfunction, EF 51%.  Started on Lasix as above.  BNP is elevated at 1200.  Foley catheter has been inserted as patient unable to get up to urinate due to ongoing worsening respiratory status.  Condom catheter could not be placed due to patient's anatomy.  IV Lasix has been discontinued.  Patient started on Lasix 40 mg p.o. daily. 3. Alcohol abuse-no signs and symptoms of alcohol withdrawal.  Continue as needed Ativan with  CIWA protocol. 4. Hypokalemia-potassium replete. 5. Disposition-PT evaluation obtained , recommend skilled nursing facility.  Awaiting bed at skilled nursing facility.   6. Non-small cell lung cancer-patient has non-small cell lung cancer cavitary mass in left upper lobe is stable for past 1 year.  No recurrence of cancer.  He has not followed with oncology in over a year.  Recommended to follow-up with oncology as outpatient. 7. Bilateral pleural effusion-patient has chronic small bilateral pleural effusion which has been stable for past many months.  He at one point had Pleurx catheter and left pleural cavity which was removed by CT surgery.  Patient had pleural fluid cytology which was negative for malignant cells on 04/07/2018 8. Goals of care-discussed with patient stepson at bedside he would like to have palliative care discussion to discuss CODE STATUS and goals of care.  Will obtain palliative care consultation.     COVID-19 Labs  No results for input(s): DDIMER, FERRITIN, LDH, CRP in the last 72 hours.  Lab Results  Component Value Date   SARSCOV2NAA NEGATIVE 11/20/2019   [place] NEGATIVE 10/06/2019   [place] NEGATIVE 09/27/2019     Scheduled medications:   . (feeding supplement) PROSource Plus  30 mL Oral BID BM  . Chlorhexidine Gluconate Cloth  6 each Topical Daily  . enoxaparin (LOVENOX) injection  40 mg Subcutaneous Q24H  . feeding supplement  237 mL Oral Q24H  . folic acid  1 mg Oral Daily  . furosemide  40 mg Oral Daily  . guaiFENesin  600 mg Oral BID  . multivitamin with minerals  1 tablet Oral Daily  . oxyCODONE  20 mg Oral Q4H  . pantoprazole  40 mg Oral BID  . senna  1 tablet Oral Daily  . sodium chloride flush  3 mL Intravenous Q12H  . thiamine  100 mg Oral Daily   Or  . thiamine  100 mg Intravenous Daily         CBG: No results for input(s): GLUCAP in the last 168 hours.  SpO2: 95 % O2 Flow Rate (L/min): 3 L/min    CBC: Recent Labs  Lab  11/21/19 0000 11/21/19 0527  WBC 10.0 9.0  NEUTROABS  --  7.6  HGB 9.2* 9.0*  HCT 32.6* 32.3*  MCV 85.6 86.6  PLT 265 962    Basic Metabolic Panel: Recent Labs  Lab 11/21/19 0000 11/21/19 0527 11/22/19 0425 11/22/19 0425 11/22/19 1456 11/23/19 0344 11/24/19 0518 11/25/19 0429 11/27/19 0405  NA 138   < > 140  --  139 139 138 139  --   K 3.9   < > 2.9*  --  3.8 3.2* 4.2 3.9  --   CL 97*   < > 96*  --  97* 97* 97* 96*  --   CO2 32   < > 34*  --  34* 33* 32 33*  --   GLUCOSE 106*   < > 91  --  102* 84 101* 119*  --   BUN 17   < > 16  --  19 18 15 14   --   CREATININE 0.77   < > 0.74   < > 0.80 0.79 0.74 0.79 0.94  CALCIUM 8.1*   < > 8.0*  --  7.9* 8.0* 8.5* 8.7*  --   MG 2.0  --   --   --   --   --   --   --   --   PHOS 3.0  --   --   --   --   --   --   --   --    < > = values in this interval not displayed.     Liver Function Tests: Recent Labs  Lab 11/21/19 0000  AST 22  ALT 12  ALKPHOS 67  BILITOT 0.6  PROT 7.1  ALBUMIN 3.2*     Antibiotics: Anti-infectives (From admission, onward)   None       DVT prophylaxis: Lovenox  Code Status: Full code  Family Communication: No family at bedside    Status is: Inpatient  Dispo: The patient is from: Home              Anticipated d/c is to: Skilled nursing facility              Anticipated d/c date is: 11/28/19              Patient currently medically stable for discharge  Barrier to discharge-awaiting bed at skilled nursing facility  Pressure Injury 03/30/18 Stage I -  Intact skin with non-blanchable redness of a localized area usually over a bony prominence. (Active)  03/30/18 2000  Location: Sacrum  Location Orientation:   Staging: Stage I -  Intact skin with non-blanchable redness of a localized area usually over a bony prominence.  Wound Description (Comments):   Present on Admission: Yes         Consultants:    Procedures:     Objective   Vitals:  11/27/19 0459 11/27/19 0802  11/27/19 0827 11/27/19 1258  BP:    131/73  Pulse:    90  Resp:    13  Temp:    98 F (36.7 C)  TempSrc:    Oral  SpO2:  96% (!) 85% 95%  Weight: 83.8 kg     Height:        Physical Examination:  General-appears in no acute distress Heart-S1-S2, regular, no murmur auscultated Lungs-scattered rhonchi bilaterally Abdomen-soft, nontender, no organomegaly Extremities-no edema in the lower extremities Neuro-alert, oriented x3, no focal deficit noted  Data Reviewed:   Recent Results (from the past 240 hour(s))  Respiratory Panel by RT PCR (Flu A&B, Covid) - Nasopharyngeal Swab     Status: None   Collection Time: 11/20/19  3:47 PM   Specimen: Nasopharyngeal Swab  Result Value Ref Range Status   SARS Coronavirus 2 by RT PCR NEGATIVE NEGATIVE Final    Comment: (NOTE) SARS-CoV-2 target nucleic acids are NOT DETECTED.  The SARS-CoV-2 RNA is generally detectable in upper respiratoy specimens during the acute phase of infection. The lowest concentration of SARS-CoV-2 viral copies this assay can detect is 131 copies/mL. A negative result does not preclude SARS-Cov-2 infection and should not be used as the sole basis for treatment or other patient management decisions. A negative result may occur with  improper specimen collection/handling, submission of specimen other than nasopharyngeal swab, presence of viral mutation(s) within the areas targeted by this assay, and inadequate number of viral copies (<131 copies/mL). A negative result must be combined with clinical observations, patient history, and epidemiological information. The expected result is Negative.  Fact Sheet for Patients:  PinkCheek.be  Fact Sheet for Healthcare Providers:  GravelBags.it  This test is no t yet approved or cleared by the [place] FDA and  has been authorized for detection and/or diagnosis of SARS-CoV-2 by FDA under an Emergency Use  Authorization (EUA). This EUA will remain  in effect (meaning this test can be used) for the duration of the COVID-19 declaration under Section 564(b)(1) of the Act, 21 U.S.C. section 360bbb-3(b)(1), unless the authorization is terminated or revoked sooner.     Influenza A by PCR NEGATIVE NEGATIVE Final   Influenza B by PCR NEGATIVE NEGATIVE Final    Comment: (NOTE) The Xpert Xpress SARS-CoV-2/FLU/RSV assay is intended as an aid in  the diagnosis of influenza from Nasopharyngeal swab specimens and  should not be used as a sole basis for treatment. Nasal washings and  aspirates are unacceptable for Xpert Xpress SARS-CoV-2/FLU/RSV  testing.  Fact Sheet for Patients: PinkCheek.be  Fact Sheet for Healthcare Providers: GravelBags.it  This test is not yet approved or cleared by the [place] FDA and  has been authorized for detection and/or diagnosis of SARS-CoV-2 by  FDA under an Emergency Use Authorization (EUA). This EUA will remain  in effect (meaning this test can be used) for the duration of the  Covid-19 declaration under Section 564(b)(1) of the Act, 21  U.S.C. section 360bbb-3(b)(1), unless the authorization is  terminated or revoked. Performed at [facility], [place] [address], [place], [place] [address]   Blood Culture (routine x 2)     Status: None   Collection Time: 11/20/19  3:47 PM   Specimen: BLOOD  Result Value Ref Range Status   Specimen Description   Final    BLOOD LEFT ANTECUBITAL Performed at [facility] [address], [place],  [address]    Special Requests  Final    BOTTLES DRAWN AEROBIC AND ANAEROBIC Blood Culture results may not be optimal due to an excessive volume of blood received in culture bottles Performed at [facility] [address], [place], [place] [address]    Culture   Final    NO GROWTH 5 DAYS Performed at [facility], [place] [address], [place], [place] [address]    Report Status 11/25/2019 FINAL  Final    BNP (last 3 results) Recent Labs    09/27/19 0403 11/20/19 1547  BNP 609.5* 1,251.9*        [place]   [facility] If 7PM-7AM, please contact night-coverage at [email or web address], Office  [phone]   11/27/2019, 3:56 PM  LOS: 7 days

## 2019-11-28 ENCOUNTER — Inpatient Hospital Stay (HOSPITAL_COMMUNITY): Payer: Medicare Other

## 2019-11-28 DIAGNOSIS — C3412 Malignant neoplasm of upper lobe, left bronchus or lung: Secondary | ICD-10-CM | POA: Diagnosis not present

## 2019-11-28 DIAGNOSIS — J9621 Acute and chronic respiratory failure with hypoxia: Secondary | ICD-10-CM | POA: Diagnosis not present

## 2019-11-28 DIAGNOSIS — I5033 Acute on chronic diastolic (congestive) heart failure: Secondary | ICD-10-CM

## 2019-11-28 DIAGNOSIS — F17200 Nicotine dependence, unspecified, uncomplicated: Secondary | ICD-10-CM | POA: Diagnosis not present

## 2019-11-28 DIAGNOSIS — F1010 Alcohol abuse, uncomplicated: Secondary | ICD-10-CM | POA: Diagnosis not present

## 2019-11-28 LAB — BASIC METABOLIC PANEL
Anion gap: 8 (ref 5–15)
BUN: 18 mg/dL (ref 8–23)
CO2: 34 mmol/L — ABNORMAL HIGH (ref 22–32)
Calcium: 8.7 mg/dL — ABNORMAL LOW (ref 8.9–10.3)
Chloride: 94 mmol/L — ABNORMAL LOW (ref 98–111)
Creatinine, Ser: 0.97 mg/dL (ref 0.61–1.24)
GFR, Estimated: >60 mL/min (ref >60)
Glucose, Bld: 101 mg/dL — ABNORMAL HIGH (ref 70–99)
Potassium: 4.3 mmol/L (ref 3.5–5.1)
Sodium: 136 mmol/L (ref 135–145)

## 2019-11-28 LAB — CBC WITH DIFFERENTIAL/PLATELET
Abs Immature Granulocytes: 0.05 10*3/uL (ref 0.00–0.07)
Basophils Absolute: 0.1 10*3/uL (ref 0.0–0.1)
Basophils Relative: 1 %
Eosinophils Absolute: 0.3 10*3/uL (ref 0.0–0.5)
Eosinophils Relative: 5 %
HCT: 32.6 % — ABNORMAL LOW (ref 39.0–52.0)
Hemoglobin: 9.5 g/dL — ABNORMAL LOW (ref 13.0–17.0)
Immature Granulocytes: 1 %
Lymphocytes Relative: 11 %
Lymphs Abs: 0.7 10*3/uL (ref 0.7–4.0)
MCH: 24.4 pg — ABNORMAL LOW (ref 26.0–34.0)
MCHC: 29.1 g/dL — ABNORMAL LOW (ref 30.0–36.0)
MCV: 83.8 fL (ref 80.0–100.0)
Monocytes Absolute: 0.9 10*3/uL (ref 0.1–1.0)
Monocytes Relative: 14 %
Neutro Abs: 4.6 10*3/uL (ref 1.7–7.7)
Neutrophils Relative %: 68 %
Platelets: 311 10*3/uL (ref 150–400)
RBC: 3.89 MIL/uL — ABNORMAL LOW (ref 4.22–5.81)
RDW: 18.6 % — ABNORMAL HIGH (ref 11.5–15.5)
WBC: 6.7 10*3/uL (ref 4.0–10.5)
nRBC: 0 % (ref 0.0–0.2)

## 2019-11-28 MED ORDER — HYDROXYZINE HCL 25 MG PO TABS
25.0000 mg | ORAL_TABLET | Freq: Three times a day (TID) | ORAL | Status: DC | PRN
  Administered 2019-11-29: 25 mg via ORAL
  Filled 2019-11-28: qty 1

## 2019-11-28 MED ORDER — BOOST / RESOURCE BREEZE PO LIQD CUSTOM: 1.0000 | Freq: Two times a day (BID) | ORAL | Status: DC

## 2019-11-28 NOTE — Progress Notes (Signed)
Nutrition Follow-up  DOCUMENTATION CODES:   Not applicable  INTERVENTION:  - will d/c Ensure Enlive and Prosource Plus. - will order Boost Breeze BID, each supplement provides 250 kcal and 9 grams of protein. - will order Magic Cup BID with lunch and dinner meals, each supplement provides 290 kcal and 9 grams of protein.  NUTRITION DIAGNOSIS:   Increased nutrient needs related to acute illness as evidenced by estimated needs. -ongoing  GOAL:   Patient will meet greater than or equal to 90% of their needs -unmet  MONITOR:   PO intake, Supplement acceptance, Labs, Weight trends  ASSESSMENT:   74 year old male with medical history of bladder cancer, abdominal attic and rhythm, anxiety, alcohol abuse, GERD, HTN, squamous cell carcinoma of the L lung diagnosed in 2018 s/p stereotactic radiation therapy on 08/25/2017. He presented to ED with complaints of shortness of breath, cough, and overall feeling unwell. CT angio chest showed small R pleural effusion with 3.5 cm x 2.7 cm mass in the LUL, no evidence of PE.  He ate 100% of breakfast, 0% of lunch, and 2% of dinner on 10/15. No meal intakes documented since that date. He has been refusing nearly all bottles of Ensure and feels it gives him loose stools. He has also been refusing nearly all packets of Prosource Plus.   Weight has been trending down with lasix order in place. Weight has been stable since 10/18. Flow sheet documentation indicates mild pitting edema to BLE.  Per notes: - acute on chronic respiratory failure--improved - acute on chronic CHF - alcohol abuse without signs of withdrawal - PT recommends SNF, pending SNF bed - NSCLC which has been stable x1 year--Oncology as outpatient - chronic small bilateral pleural effusions - MD note 10/19 stated anticipated d/c of today (10/20)     Labs reviewed; Cl: 94 mmol/l, Ca: 8.7 mg/dl. Medications reviewed; 1 mg folvite/day, 40 mg oral lasix/day, 1 tablet multivitamin with  minerals/day, 40 mg oral protonix BID, 100 mg thiamine/day.   Diet Order:   Diet Order            Diet Heart Room service appropriate? Yes; Fluid consistency: Thin  Diet effective now                 EDUCATION NEEDS:   No education needs have been identified at this time  Skin:  Skin Assessment: Reviewed RN Assessment  Last BM:  10/19 per RN documentation  Height:   Ht Readings from Last 1 Encounters:  11/21/19 5\' 10"  (1.778 m)    Weight:   Wt Readings from Last 1 Encounters:  11/28/19 82.3 kg     Estimated Nutritional Needs:  Kcal:  1800-2000 kcal Protein:  85-95 grams Fluid:  >/= 1.5 L/day      Jarome Matin, MS, RD, LDN, CNSC Inpatient Clinical Dietitian RD pager # available in AMION  After hours/weekend pager # available in [facility]

## 2019-11-28 NOTE — Plan of Care (Signed)
  Problem: Health Behavior/Discharge Planning: Goal: Ability to manage health-related needs will improve Outcome: Progressing   Problem: Clinical Measurements: Goal: Ability to maintain clinical measurements within normal limits will improve Outcome: Progressing Goal: Will remain free from infection Outcome: Progressing Goal: Respiratory complications will improve Outcome: Progressing Goal: Cardiovascular complication will be avoided Outcome: Progressing   Problem: Activity: Goal: Risk for activity intolerance will decrease Outcome: Progressing   Problem: Nutrition: Goal: Adequate nutrition will be maintained Outcome: Progressing   Problem: Coping: Goal: Level of anxiety will decrease Outcome: Progressing   Problem: Elimination: Goal: Will not experience complications related to urinary retention Outcome: Progressing   Problem: Safety: Goal: Ability to remain free from injury will improve Outcome: Progressing   Problem: Skin Integrity: Goal: Risk for impaired skin integrity will decrease Outcome: Progressing

## 2019-11-28 NOTE — Progress Notes (Signed)
PT Cancellation Note  Patient Details Name: Tyler Mcmillan MRN: [id number] DOB: [birth year]   Cancelled Treatment:    Reason Eval/Treat Not Completed: Patient declined, no reason specified Pt declined today.  Pt states he has been getting up to [facility] even to urinate so he is trying be OOB more however declines further mobility today.   Kineta Fudala,KATHrine E 11/28/2019, 2:06 PM Arlyce Dice, DPT Acute Rehabilitation Services Pager: [phone] Office: [phone]

## 2019-11-28 NOTE — Progress Notes (Signed)
Palliative Medicine Team consult was received.   I have scheduled a meeting with patient and his stepson for tomorrow at [place].   If there are urgent needs or questions please call [phone]. Thank you for consulting out team to assist with this patients care.  Micheline Rough, MD [place] Palliative Medicine Team [phone]  NO CHARGE NOTE

## 2019-11-28 NOTE — NC FL2 (Signed)
[place] LEVEL OF CARE SCREENING TOOL     IDENTIFICATION  Patient Name: Tyler Mcmillan Birthdate: 02-Aug-1945 Sex: male Admission Date (Current Location): 11/20/2019  [facility] and [place] Number:  Herbalist and Address:  [facility],  [place], [place]      Provider Number: [id number]  Attending Physician Name and Address:  Kerney Elbe, DO  Relative Name and Phone Number:  Phifer,David Relative [phone] or Phifer,David Relative [phone]    Current Level of Care: Hospital Recommended Level of Care: [place] Prior Approval Number:    Date Approved/Denied:   PASRR Number: [id number]  Discharge Plan: SNF    Current Diagnoses: Patient Active Problem List   Diagnosis Date Noted  . Acute on chronic respiratory failure with hypoxemia ([place]) 11/20/2019  . Suicide attempt by substance overdose ([place]) 10/07/2019  . Hypokalemia 10/02/2019  . Other cirrhosis of liver ([place]) 09/30/2019  . Aspiration pneumonia ([place]) 09/28/2019  . Acute respiratory failure with hypoxia ([place]) 09/27/2019  . Intentional overdose of drug in tablet form ([place]) 09/27/2019  . Alcohol abuse 09/27/2019  . Tobacco dependence 09/27/2019  . Acute on chronic respiratory failure with hypoxia ([place]) 04/06/2018  . Malnutrition of moderate degree 04/05/2018  . Pressure injury of skin 04/05/2018  . GI bleed 03/31/2018  . Acute post-hemorrhagic anemia   . Malignant neoplasm metastatic to left lung ([place])   . Peripheral edema   . Pleural effusion on left   . Pleural effusion, left   . Fall at home, initial encounter   . Generalized weakness   . Acute GI bleeding 03/30/2018  . Malignant neoplasm of bronchus of left upper lobe ([place]) 08/09/2016  . Hypertensive heart disease without heart failure   . Chest pain 07/02/2016  . Cavitary lesion of lung 07/02/2016  . Unstable angina ([place])   . AAA (abdominal aortic aneurysm) ([place])  07/09/2014  . Lumbar degenerative disc disease 07/09/2014    Orientation RESPIRATION BLADDER Height & Weight     Time, Situation, Place, Self  O2 (3L) Continent Weight: 181 lb 7 oz (82.3 kg) Height:  5\' 10"  (177.8 cm)  BEHAVIORAL SYMPTOMS/MOOD NEUROLOGICAL BOWEL NUTRITION STATUS      Continent Diet (Cardiac)  AMBULATORY STATUS COMMUNICATION OF NEEDS Skin   Limited Assist Verbally Normal                       Personal Care Assistance Level of Assistance  Bathing, Feeding, Dressing Bathing Assistance: Limited assistance Feeding assistance: Independent Dressing Assistance: Limited assistance     Functional Limitations Info  Sight, Speech, Hearing Sight Info: Adequate Hearing Info: Adequate Speech Info: Adequate    SPECIAL CARE FACTORS FREQUENCY  PT (By licensed PT), OT (By licensed OT)     PT Frequency: Minimum 5x a week OT Frequency: Minimum 5x a week            Contractures Contractures Info: Not present    Additional Factors Info  Code Status, Allergies Code Status Info: Full Code Allergies Info: NKA           Current Medications (11/28/2019):  This is the current hospital active medication list Current Facility-Administered Medications  Medication Dose Route Frequency Provider Last Rate Last Admin  . (feeding supplement) PROSource Plus liquid 30 mL  30 mL Oral BID BM Oswald Hillock, MD   30 mL at 11/24/19 2009  . 0.9 %  sodium chloride infusion  250 mL  Intravenous PRN Elwyn Reach, MD      . acetaminophen (TYLENOL) tablet 650 mg  650 mg Oral Q4H PRN Elwyn Reach, MD   650 mg at 11/28/19 0955  . clonazePAM (KLONOPIN) tablet 1 mg  1 mg Oral TID PRN Elwyn Reach, MD   1 mg at 11/27/19 2004  . enoxaparin (LOVENOX) injection 40 mg  40 mg Subcutaneous Q24H Gala Romney L, MD   40 mg at 11/28/19 0957  . feeding supplement (ENSURE ENLIVE / ENSURE PLUS) liquid 237 mL  237 mL Oral Q24H Oswald Hillock, MD   237 mL at 11/27/19 1552  . folic acid  (FOLVITE) tablet 1 mg  1 mg Oral Daily Gala Romney L, MD   1 mg at 11/28/19 0957  . furosemide (LASIX) tablet 40 mg  40 mg Oral Daily Oswald Hillock, MD   40 mg at 11/28/19 0957  . guaiFENesin (MUCINEX) 12 hr tablet 600 mg  600 mg Oral BID Oswald Hillock, MD   600 mg at 11/28/19 0957  . magic mouthwash w/lidocaine  10 mL Oral QID PRN Oswald Hillock, MD   10 mL at 11/26/19 2222  . multivitamin with minerals tablet 1 tablet  1 tablet Oral Daily Elwyn Reach, MD   1 tablet at 11/28/19 0956  . ondansetron (ZOFRAN) injection 4 mg  4 mg Intravenous Q6H PRN Gala Romney L, MD      . oxyCODONE (Oxy IR/ROXICODONE) immediate release tablet 20 mg  20 mg Oral Q4H Gala Romney L, MD   20 mg at 11/28/19 0736  . pantoprazole (PROTONIX) EC tablet 40 mg  40 mg Oral BID Elwyn Reach, MD   40 mg at 11/28/19 0957  . senna (SENOKOT) tablet 8.6 mg  1 tablet Oral Daily Gala Romney L, MD   8.6 mg at 11/28/19 0956  . sodium chloride flush (NS) 0.9 % injection 3 mL  3 mL Intravenous Q12H Elwyn Reach, MD   3 mL at 11/27/19 2152  . sodium chloride flush (NS) 0.9 % injection 3 mL  3 mL Intravenous PRN Gala Romney L, MD      . thiamine tablet 100 mg  100 mg Oral Daily Gala Romney L, MD   100 mg at 11/28/19 0957   Or  . thiamine (B-1) injection 100 mg  100 mg Intravenous Daily Elwyn Reach, MD         Discharge Medications: Please see discharge summary for a list of discharge medications.  Relevant Imaging Results:  Relevant Lab Results:   Additional Information SSN [id number]  Ross Ludwig, LCSW

## 2019-11-28 NOTE — Progress Notes (Signed)
PROGRESS NOTE    Tyler Mcmillan  TGG:[id number] DOB: 1945/06/03 DOA: 11/20/2019 PCP: [name], MD   Brief Narrative:  HPI per Dr. Gala Romney on 11/20/19 Tyler Mcmillan is a 74 y.o. male with medical history significant of bladder cancer, abdominal aortic aneurysm, chronic anxiety, alcoholism, GERD, hypertension, squamous cell carcinoma of the left lung who was diagnosed previously in 2018.  Followed by Dr. [name] and Dr. Earlie Server.  He was also seen by Dr. [name].  He has stereotactic radiation therapy into 08/25/2017.  Patient appears to have lost follow-up since last year.  He was referred for continued oncology follow-up.  Still Thoracics surgery was evaluated patient for his resection.  He came to the ER today complaining of shortness of breath cough and feeling terrible.  He is slightly tremulous.  He still drinks alcohol and has had drinks today.  Patient has been on chronic pain medication as well.  His evaluation today showed increasing bilateral lower extremity edema with chronic stasis, has run out of his oral Lasix for a while.  His BNP is elevated but CT and x-ray of the chest do not showed overt CHF.  Is being admitted to the hospital for further evaluation and treatment..  ED Course: Temperature 98.2 blood pressure 177/84, pulse 130 respirate of 25 oxygen sat 82% on room air.  White count is 8.3 hemoglobin 9.7 platelets 300.  BNP of 1251 LDH 145 triglyceride 74 ferritin 13 CRP 1.3 lactic acid 1.4 procalcitonin less than 0.1.  COVID-19 screen is negative.  CT angiogram of the chest showed small right pleural effusion with 3.5 x 2.7 cm mass in the left upper lobe no evidence of PE.  Patient being admitted to the hospital with acute respiratory failure with hypoxia probably related to his malignancy, pleural effusions pneumonia or CHF.  **Interim History  Patient was diuresed and changed to p.o. diuresis.  He has improved significantly from his respiratory status and  has been deemed stable for discharge as he is diuresed almost 10 L and dropped almost 17 pounds.  Patient is open to going to skilled nursing facility prior to going home and currently bed is is pending.  Currently stable to be discharged at this time.  Assessment & Plan:   Principal Problem:   Acute on chronic respiratory failure with hypoxia ([name]) Active Problems:   Malignant neoplasm of bronchus of left upper lobe ([name])   Alcohol abuse   Tobacco dependence   Acute on chronic respiratory failure with hypoxemia ([name])  Acute on Chronic Hypoxemic Respiratory Failure -Improved, currently at baseline.   -Patient initially required 5 L/min of oxygen via nasal cannula,.   -He has underlying diastolic CHF.   BNP was elevated to 1200.  Patient ran out of oral Lasix.   -Patient also had pneumonia and postobstructive symptoms from lung cancer.  Complete Abx -Patient was started on Lasix 40 mg IV every 12 hours.  He has diuresed well.  IV Lasix was discontinued and started on Lasix 40 mg p.o. daily. -SpO2: 94 % O2 Flow Rate (L/min): 3 L/min -Continue to Monitor Respiratory Status carefully and continue supplemental oxygen via nasal cannula and wean O2 as tolerated -C/w Guaifenesin 600 mg po BID -Continuous pulse oximetry maintain O2 saturations greater than 90%  Acute on Chronic Diastolic CHF -Echocardiogram done today shows grade 2 diastolic dysfunction, EF 70%.   -Started on Lasix as above.   -BNP is elevated at 1200.   -Foley catheter had been inserted as patient  unable to get up to urinate due to ongoing worsening respiratory status.   -Condom catheter could not be placed due to patient's anatomy.  -IV Lasix has been discontinued.  Patient started on Lasix 40 mg p.o. daily and will continue -Patient is -17 lbs and he is -9.262 liters -Continue to Monitor for S/Sx of Volume Overload -Repeat CXR showed "  Alcohol Abuse -No signs and symptoms of alcohol withdrawal.  Continue as needed  Ativan with CIWA protocol. -C/w Folic Acid 1 mg po Daily, MVI+ Minerals, and Thiamine 100 mg po Daily  GERD -C/w Pantoprazole 40 mg po BID  Hypokalemia -Improved and K+ is 4.3 -Continue to Monitor and Replete as Necessary -Repeat CMP in the AM   Disposition -PT evaluation obtained , recommend skilled nursing facility.  Awaiting bed at skilled nursing facility and this is pending    Non-Small Cell Lung Cancer -patient has non-small cell lung cancer cavitary mass in left upper lobe is stable for past 1 year.   -No recurrence of cancer.   -He has not followed with oncology in over a year.  Recommended to follow-up with oncology as outpatient.  Bilateral Pleural Effusion -Patient has chronic small bilateral pleural effusion which has been stable for past many months.   -He at one point had Pleurx catheter and left pleural cavity which was removed by CT surgery Dr. Servando Snare.   -Patient had pleural fluid cytology which was negative for malignant cells on 04/07/2018 -Repeat CXR "Bilateral pleural effusions, left more than right. Less pleural fluid than was seen 1 week ago. No worsening or new finding."  Goals of Care -Dr. Darrick Meigs discussed with patient stepson at bedside he would like to have palliative care discussion to discuss CODE STATUS and goals of care.   -Palliative care consultation pending and family meeting scheduled for 11/29/19 at 10 AM  Normocytic Anemia -Patient's Hgb/Hct went from 9.0/32.3 -> 9.5/32.6 -Ferritin level was 13 -Check anemia panel in the a.m. -Continue to Monitor for S/Sx of Bleeding; Currently no overt bleeding noted -Repeat CBC in the AM   Overweight -Estimated body mass index is 26.03 kg/m as calculated from the following:   Height as of this encounter: 5\' 10"  (1.778 m).   Weight as of this encounter: 82.3 kg. -Nutritionist consulted for further evaluation and recommendations -Going to discontinue Ensure Enlive and Prosource plus in order to boost  breeze twice daily and Magic cup twice daily  Anxiety -C/w Clonazepam 1 mg po TIDprn Anxiety   DVT prophylaxis: Enoxaparin 40 mg sq q24h Code Status: FULL CODE Family Communication: No family present at bedside  Disposition Plan: Anticipating discharging to SNF when bed is available  Status is: Inpatient  Remains inpatient appropriate because:Unsafe d/c plan, IV treatments appropriate due to intensity of illness or inability to take PO and Inpatient level of care appropriate due to severity of illness   Dispo: The patient is from: Home              Anticipated d/c is to: SNF              Anticipated d/c date is: 1 day              Patient currently is medically stable to d/c.  Consultants:   Palliative Care   Procedures: None  Antimicrobials:  Anti-infectives (From admission, onward)   None     Subjective: Seen and examined at bedside and is still a little short of breath but feels much better  and back to his baseline.  Denies any chest pain, lightheadedness or dizziness.  Has diuresed quite well and states that his dry weight is usually around 174.  He denies any nausea or vomiting.  Denies any lightheadedness or dizziness.  Feels well and ready to go to skilled nursing facility.  No other concerns or complaints at this time.  Objective: Vitals:   11/27/19 2113 11/28/19 0441 11/28/19 0500 11/28/19 1318  BP: (!) 127/57 131/60  127/61  Pulse: 77 82  75  Resp: 16 16  16   Temp: 99.5 F (37.5 C) 99 F (37.2 C)  98.3 F (36.8 C)  TempSrc: Oral Oral  Oral  SpO2: 92% 92%  94%  Weight:   82.3 kg   Height:        Intake/Output Summary (Last 24 hours) at 11/28/2019 1906 Last data filed at 11/28/2019 1500 Gross per 24 hour  Intake 580 ml  Output --  Net 580 ml   Filed Weights   11/26/19 0453 11/27/19 0459 11/28/19 0500  Weight: 81.7 kg 83.8 kg 82.3 kg   Examination: Physical Exam:  Constitutional: WN/WD overweight Caucasian male currently in NAD and appears calm  and comfortable Eyes: Lids and conjunctivae normal, sclerae anicteric  ENMT: External Ears, Nose appear normal. Grossly normal hearing.  Neck: Appears normal, supple, no cervical masses, normal ROM, no appreciable thyromegaly; no JVD Respiratory: Diminished to auscultation bilaterally with coarse breath sounds and some crackles bilaterally, no wheezing, rales, rhonchi or crackles. Normal respiratory effort and patient is not tachypenic. No accessory muscle use.  Unlabored breathing wearing supplemental oxygen via nasal cannula Cardiovascular: RRR, no murmurs / rubs / gallops. S1 and S2 auscultated. No extremity edema. 2+ pedal pulses. No carotid bruits.  Abdomen: Soft, non-tender, distended secondary body habitus. Bowel sounds positive.  GU: Deferred. Musculoskeletal: No clubbing / cyanosis of digits/nails. No joint deformity upper and lower extremities.   Skin: No rashes, lesions, ulcers on limited skin evaluation. No induration; Warm and dry.  Neurologic: CN 2-12 grossly intact with no focal deficits. Romberg sign and cerebellar reflexes not assessed.  Psychiatric: Normal judgment and insight. Alert and oriented x 3. Normal mood and appropriate affect.   Data Reviewed: I have personally reviewed following labs and imaging studies  CBC: Recent Labs  Lab 11/28/19 1103  WBC 6.7  NEUTROABS 4.6  HGB 9.5*  HCT 32.6*  MCV 83.8  PLT 440   Basic Metabolic Panel: Recent Labs  Lab 11/22/19 1456 11/22/19 1456 11/23/19 0344 11/24/19 0518 11/25/19 0429 11/27/19 0405 11/28/19 0353  NA 139  --  139 138 139  --  136  K 3.8  --  3.2* 4.2 3.9  --  4.3  CL 97*  --  97* 97* 96*  --  94*  CO2 34*  --  33* 32 33*  --  34*  GLUCOSE 102*  --  84 101* 119*  --  101*  BUN 19  --  18 15 14   --  18  CREATININE 0.80   < > 0.79 0.74 0.79 0.94 0.97  CALCIUM 7.9*  --  8.0* 8.5* 8.7*  --  8.7*   < > = values in this interval not displayed.   GFR: Estimated Creatinine Clearance: 69 mL/min (by C-G  formula based on SCr of 0.97 mg/dL). Liver Function Tests: No results for input(s): AST, ALT, ALKPHOS, BILITOT, PROT, ALBUMIN in the last 168 hours. No results for input(s): LIPASE, AMYLASE in the last 168 hours. No results  for input(s): AMMONIA in the last 168 hours. Coagulation Profile: No results for input(s): INR, PROTIME in the last 168 hours. Cardiac Enzymes: No results for input(s): CKTOTAL, CKMB, CKMBINDEX, TROPONINI in the last 168 hours. BNP (last 3 results) No results for input(s): PROBNP in the last 8760 hours. HbA1C: No results for input(s): HGBA1C in the last 72 hours. CBG: No results for input(s): GLUCAP in the last 168 hours. Lipid Profile: No results for input(s): CHOL, HDL, LDLCALC, TRIG, CHOLHDL, LDLDIRECT in the last 72 hours. Thyroid Function Tests: No results for input(s): TSH, T4TOTAL, FREET4, T3FREE, THYROIDAB in the last 72 hours. Anemia Panel: No results for input(s): VITAMINB12, FOLATE, FERRITIN, TIBC, IRON, RETICCTPCT in the last 72 hours. Sepsis Labs: No results for input(s): PROCALCITON, LATICACIDVEN in the last 168 hours.  Recent Results (from the past 240 hour(s))  Respiratory Panel by RT PCR (Flu A&B, Covid) - Nasopharyngeal Swab     Status: None   Collection Time: 11/20/19  3:47 PM   Specimen: Nasopharyngeal Swab  Result Value Ref Range Status   SARS Coronavirus 2 by RT PCR NEGATIVE NEGATIVE Final    Comment: (NOTE) SARS-CoV-2 target nucleic acids are NOT DETECTED.  The SARS-CoV-2 RNA is generally detectable in upper respiratoy specimens during the acute phase of infection. The lowest concentration of SARS-CoV-2 viral copies this assay can detect is 131 copies/mL. A negative result does not preclude SARS-Cov-2 infection and should not be used as the sole basis for treatment or other patient management decisions. A negative result may occur with  improper specimen collection/handling, submission of specimen other than nasopharyngeal swab,  presence of viral mutation(s) within the areas targeted by this assay, and inadequate number of viral copies (<131 copies/mL). A negative result must be combined with clinical observations, patient history, and epidemiological information. The expected result is Negative.  Fact Sheet for Patients:  PinkCheek.be  Fact Sheet for Healthcare Providers:  GravelBags.it  This test is no t yet approved or cleared by the [place] FDA and  has been authorized for detection and/or diagnosis of SARS-CoV-2 by FDA under an Emergency Use Authorization (EUA). This EUA will remain  in effect (meaning this test can be used) for the duration of the COVID-19 declaration under Section 564(b)(1) of the Act, 21 U.S.C. section 360bbb-3(b)(1), unless the authorization is terminated or revoked sooner.     Influenza A by PCR NEGATIVE NEGATIVE Final   Influenza B by PCR NEGATIVE NEGATIVE Final    Comment: (NOTE) The Xpert Xpress SARS-CoV-2/FLU/RSV assay is intended as an aid in  the diagnosis of influenza from Nasopharyngeal swab specimens and  should not be used as a sole basis for treatment. Nasal washings and  aspirates are unacceptable for Xpert Xpress SARS-CoV-2/FLU/RSV  testing.  Fact Sheet for Patients: PinkCheek.be  Fact Sheet for Healthcare Providers: GravelBags.it  This test is not yet approved or cleared by the [place] FDA and  has been authorized for detection and/or diagnosis of SARS-CoV-2 by  FDA under an Emergency Use Authorization (EUA). This EUA will remain  in effect (meaning this test can be used) for the duration of the  Covid-19 declaration under Section 564(b)(1) of the Act, 21  U.S.C. section 360bbb-3(b)(1), unless the authorization is  terminated or revoked. Performed at [facility], [place] [address] [place], [place] [address]     Blood Culture (routine x 2)     Status: None   Collection Time: 11/20/19  3:47 PM   Specimen: BLOOD  Result Value Ref Range Status   Specimen Description   Final    BLOOD LEFT ANTECUBITAL Performed at [facility] [address] [place], [place] [address]    Special Requests   Final    BOTTLES DRAWN AEROBIC AND ANAEROBIC Blood Culture results may not be optimal due to an excessive volume of blood received in culture bottles Performed at [facility] [address], [place], [place] [address]    Culture   Final    NO GROWTH 5 DAYS Performed at [facility], [place] [address] [place], [place] [address]    Report Status 11/25/2019 FINAL  Final     RN Pressure Injury Documentation: Pressure Injury 03/30/18 Stage I -  Intact skin with non-blanchable redness of a localized area usually over a bony prominence. (Active)  03/30/18 2000  Location: Sacrum  Location Orientation:   Staging: Stage I -  Intact skin with non-blanchable redness of a localized area usually over a bony prominence.  Wound Description (Comments):   Present on Admission: Yes     Estimated body mass index is 26.03 kg/m as calculated from the following:   Height as of this encounter: 5\' 10"  (1.778 m).   Weight as of this encounter: 82.3 kg.  Malnutrition Type:  Nutrition Problem: Increased nutrient needs Etiology: acute illness   Malnutrition Characteristics:  Signs/Symptoms: estimated needs   Nutrition Interventions:  Interventions: Ensure Enlive (each supplement provides 350kcal and 20 grams of protein), MVI, Other (Comment) (Prosource Plus)     Radiology Studies: DG CHEST PORT 1 VIEW  Result Date: 11/28/2019 CLINICAL DATA:  Shortness of breath over the last 6 days. History of lung cancer and pleural effusion. EXAM: PORTABLE CHEST 1 VIEW COMPARISON:  11/21/2019 FINDINGS: Probable small amount of layering pleural fluid on the right with mild right base  atelectasis. Small pleural effusion on the left with associated atelectasis. Postoperative scarring in the left chest. There is probably less pleural fluid than was visible 1 week ago. No worsening or new finding. IMPRESSION: Bilateral pleural effusions, left more than right. Less pleural fluid than was seen 1 week ago. No worsening or new finding. Electronically Signed   By: Nelson Chimes M.D.   On: 11/28/2019 09:09        Scheduled Meds:  enoxaparin (LOVENOX) injection  40 mg Subcutaneous Q24H   feeding supplement  1 Container Oral BID BM   folic acid  1 mg Oral Daily   furosemide  40 mg Oral Daily   guaiFENesin  600 mg Oral BID   multivitamin with minerals  1 tablet Oral Daily   oxyCODONE  20 mg Oral Q4H   pantoprazole  40 mg Oral BID   senna  1 tablet Oral Daily   sodium chloride flush  3 mL Intravenous Q12H   thiamine  100 mg Oral Daily   Or   thiamine  100 mg Intravenous Daily   Continuous Infusions:  sodium chloride       LOS: 8 days   Kerney Elbe, DO [facility] PAGER is on AMION  If 7PM-7AM, please contact night-coverage [email or web address]

## 2019-11-29 DIAGNOSIS — D649 Anemia, unspecified: Secondary | ICD-10-CM

## 2019-11-29 DIAGNOSIS — Z515 Encounter for palliative care: Secondary | ICD-10-CM

## 2019-11-29 DIAGNOSIS — F1010 Alcohol abuse, uncomplicated: Secondary | ICD-10-CM | POA: Diagnosis not present

## 2019-11-29 DIAGNOSIS — C3412 Malignant neoplasm of upper lobe, left bronchus or lung: Secondary | ICD-10-CM | POA: Diagnosis not present

## 2019-11-29 DIAGNOSIS — E538 Deficiency of other specified B group vitamins: Secondary | ICD-10-CM

## 2019-11-29 DIAGNOSIS — F17200 Nicotine dependence, unspecified, uncomplicated: Secondary | ICD-10-CM | POA: Diagnosis not present

## 2019-11-29 DIAGNOSIS — Z7189 Other specified counseling: Secondary | ICD-10-CM

## 2019-11-29 DIAGNOSIS — J9621 Acute and chronic respiratory failure with hypoxia: Secondary | ICD-10-CM | POA: Diagnosis not present

## 2019-11-29 LAB — CBC WITH DIFFERENTIAL/PLATELET
Abs Immature Granulocytes: 0.04 10*3/uL (ref 0.00–0.07)
Basophils Absolute: 0.1 10*3/uL (ref 0.0–0.1)
Basophils Relative: 1 %
Eosinophils Absolute: 0.3 10*3/uL (ref 0.0–0.5)
Eosinophils Relative: 6 %
HCT: 34.5 % — ABNORMAL LOW (ref 39.0–52.0)
Hemoglobin: 9.8 g/dL — ABNORMAL LOW (ref 13.0–17.0)
Immature Granulocytes: 1 %
Lymphocytes Relative: 11 %
Lymphs Abs: 0.7 10*3/uL (ref 0.7–4.0)
MCH: 24.3 pg — ABNORMAL LOW (ref 26.0–34.0)
MCHC: 28.4 g/dL — ABNORMAL LOW (ref 30.0–36.0)
MCV: 85.6 fL (ref 80.0–100.0)
Monocytes Absolute: 0.9 10*3/uL (ref 0.1–1.0)
Monocytes Relative: 14 %
Neutro Abs: 4.2 10*3/uL (ref 1.7–7.7)
Neutrophils Relative %: 67 %
Platelets: 316 10*3/uL (ref 150–400)
RBC: 4.03 MIL/uL — ABNORMAL LOW (ref 4.22–5.81)
RDW: 18.5 % — ABNORMAL HIGH (ref 11.5–15.5)
WBC: 6.2 10*3/uL (ref 4.0–10.5)
nRBC: 0 % (ref 0.0–0.2)

## 2019-11-29 LAB — VITAMIN B12: Vitamin B-12: 154 pg/mL — ABNORMAL LOW (ref 180–914)

## 2019-11-29 LAB — RESPIRATORY PANEL BY RT PCR (FLU A&B, COVID)
Influenza A by PCR: NEGATIVE
Influenza B by PCR: NEGATIVE
SARS Coronavirus 2 by RT PCR: NEGATIVE

## 2019-11-29 LAB — BASIC METABOLIC PANEL
Anion gap: 13 (ref 5–15)
BUN: 17 mg/dL (ref 8–23)
CO2: 32 mmol/L (ref 22–32)
Calcium: 9 mg/dL (ref 8.9–10.3)
Chloride: 91 mmol/L — ABNORMAL LOW (ref 98–111)
Creatinine, Ser: 0.87 mg/dL (ref 0.61–1.24)
GFR, Estimated: >60 mL/min (ref >60)
Glucose, Bld: 101 mg/dL — ABNORMAL HIGH (ref 70–99)
Potassium: 4.6 mmol/L (ref 3.5–5.1)
Sodium: 136 mmol/L (ref 135–145)

## 2019-11-29 LAB — IRON AND TIBC
Iron: 39 ug/dL — ABNORMAL LOW (ref 45–182)
Saturation Ratios: 8 % — ABNORMAL LOW (ref 17.9–39.5)
TIBC: 471 ug/dL — ABNORMAL HIGH (ref 250–450)
UIBC: 432 ug/dL

## 2019-11-29 LAB — FOLATE: Folate: 27.6 ng/mL (ref >5.9)

## 2019-11-29 LAB — RETICULOCYTES
Immature Retic Fract: 16.1 % — ABNORMAL HIGH (ref 2.3–15.9)
RBC.: 4.05 MIL/uL — ABNORMAL LOW (ref 4.22–5.81)
Retic Count, Absolute: 66.4 10*3/uL (ref 19.0–186.0)
Retic Ct Pct: 1.6 % (ref 0.4–3.1)

## 2019-11-29 LAB — PHOSPHORUS: Phosphorus: 3.6 mg/dL (ref 2.5–4.6)

## 2019-11-29 LAB — MAGNESIUM: Magnesium: 2.3 mg/dL (ref 1.7–2.4)

## 2019-11-29 LAB — FERRITIN: Ferritin: 38 ng/mL (ref 24–336)

## 2019-11-29 MED ORDER — POLYSACCHARIDE IRON COMPLEX 150 MG PO CAPS
150.0000 mg | ORAL_CAPSULE | Freq: Every day | ORAL | Status: DC
  Administered 2019-11-29: 150 mg via ORAL
  Filled 2019-11-29: qty 1

## 2019-11-29 MED ORDER — GABAPENTIN 100 MG PO CAPS
100.0000 mg | ORAL_CAPSULE | Freq: Three times a day (TID) | ORAL | Status: DC
Start: 2019-11-29 — End: 2022-12-28

## 2019-11-29 MED ORDER — FOLIC ACID 1 MG PO TABS
1.0000 mg | ORAL_TABLET | Freq: Every day | ORAL | 0 refills | Status: DC
Start: 2019-11-30 — End: 2022-12-28

## 2019-11-29 MED ORDER — POLYSACCHARIDE IRON COMPLEX 150 MG PO CAPS
150.0000 mg | ORAL_CAPSULE | Freq: Every day | ORAL | Status: DC
Start: 2019-11-30 — End: 2022-12-28

## 2019-11-29 MED ORDER — FUROSEMIDE 40 MG PO TABS
40.0000 mg | ORAL_TABLET | Freq: Two times a day (BID) | ORAL | Status: DC
  Administered 2019-11-29 – 2019-11-30 (×2): 40 mg via ORAL
  Filled 2019-11-29 (×2): qty 1

## 2019-11-29 MED ORDER — VITAMIN B-12 1000 MCG PO TABS
1000.0000 ug | ORAL_TABLET | Freq: Every day | ORAL | Status: DC
  Administered 2019-11-29 – 2019-11-30 (×2): 1000 ug via ORAL
  Filled 2019-11-29 (×2): qty 1

## 2019-11-29 MED ORDER — ADULT MULTIVITAMIN W/MINERALS CH
1.0000 | ORAL_TABLET | Freq: Every day | ORAL | Status: DC
Start: 2019-11-30 — End: 2022-12-28

## 2019-11-29 MED ORDER — ACETAMINOPHEN 325 MG PO TABS: 650.0000 mg | ORAL_TABLET | ORAL | Status: DC | PRN

## 2019-11-29 MED ORDER — HYDROXYZINE HCL 25 MG PO TABS: 25.0000 mg | ORAL_TABLET | Freq: Three times a day (TID) | ORAL | 0 refills | Status: DC | PRN

## 2019-11-29 MED ORDER — OXYCODONE HCL 20 MG PO TABS
20.0000 mg | ORAL_TABLET | ORAL | 0 refills | Status: DC | PRN
Start: 2019-11-29 — End: 2019-11-30

## 2019-11-29 MED ORDER — THIAMINE HCL 100 MG PO TABS
100.0000 mg | ORAL_TABLET | Freq: Every day | ORAL | Status: DC
Start: 2019-11-30 — End: 2022-12-28

## 2019-11-29 MED ORDER — FUROSEMIDE 40 MG PO TABS
ORAL_TABLET | ORAL | Status: DC
Start: 2019-11-29 — End: 2022-12-28

## 2019-11-29 MED ORDER — GUAIFENESIN ER 600 MG PO TB12: 600.0000 mg | ORAL_TABLET | Freq: Two times a day (BID) | ORAL | 0 refills | Status: AC

## 2019-11-29 MED ORDER — CYANOCOBALAMIN 1000 MCG PO TABS: 1000.0000 ug | ORAL_TABLET | Freq: Every day | ORAL | Status: DC

## 2019-11-29 MED ORDER — GABAPENTIN 100 MG PO CAPS
100.0000 mg | ORAL_CAPSULE | Freq: Three times a day (TID) | ORAL | Status: DC
  Administered 2019-11-29 – 2019-11-30 (×4): 100 mg via ORAL
  Filled 2019-11-29 (×4): qty 1

## 2019-11-29 MED ORDER — CLONAZEPAM 1 MG PO TABS: 1.0000 mg | ORAL_TABLET | Freq: Every evening | ORAL | 0 refills | Status: DC | PRN

## 2019-11-29 NOTE — TOC Transition Note (Addendum)
Transition of Care [facility]) - CM/SW Discharge Note   Patient Details  Name: Tyler Mcmillan MRN: [id number] Date of Birth: January 09, 1946  Transition of Care [facility]) CM/SW Contact:  Ross Ludwig, LCSW Phone Number: 11/29/2019, 12:59 PM   Clinical Narrative:     CSW presented bed offers to patient and his stepson, they chose [facility].  CSW contacted [facility], and they can accept patient today pending negative Covid test.  Insurance authroization has been approved [place] number [id number].  Patient to be d/c'ed today to [facility] in [place].  Patient and family agreeable to plans will transport via ems RN to call report to [phone].  Patient's stepson Shanon Brow was made aware of patient discharging today.  Final next level of care: Skilled Nursing Facility Barriers to Discharge: Barriers Resolved   Patient Goals and CMS Choice Patient states their goals for this hospitalization and ongoing recovery are:: To go to SNF for short term rehab, then return back home with home health. CMS Medicare.gov Compare Post Acute Care list provided to:: Patient Represenative (must comment) Choice offered to / list presented to : Patient, [facility] POA / Guardian  Discharge Placement PASRR number recieved: 11/26/19            Patient chooses bed at: Other - please specify in the comment section below: ([facility]) Patient to be transferred to facility by: [facility] Name of family member notified: Son Shanon Brow Patient and family notified of of transfer: 11/29/19  Discharge Plan and Services     Post Acute Care Choice: [place]                               Social Determinants of Health (SDOH) Interventions     Readmission Risk Interventions Readmission Risk Prevention Plan 10/12/2019  Transportation Screening Complete  PCP or Specialist Appt within 5-7 Days Complete  Home Care Screening Complete  Medication Review (RN CM) Complete  Some recent data might be  hidden

## 2019-11-29 NOTE — Plan of Care (Signed)
  Problem: Health Behavior/Discharge Planning: Goal: Ability to manage health-related needs will improve 11/29/2019 1458 by Zadie Rhine, RN Outcome: Progressing 11/29/2019 1433 by Zadie Rhine, RN Outcome: Progressing 11/29/2019 1319 by Zadie Rhine, RN Outcome: Progressing   Problem: Clinical Measurements: Goal: Ability to maintain clinical measurements within normal limits will improve 11/29/2019 1458 by Zadie Rhine, RN Outcome: Progressing 11/29/2019 1433 by Zadie Rhine, RN Outcome: Progressing 11/29/2019 1319 by Zadie Rhine, RN Outcome: Progressing

## 2019-11-29 NOTE — Plan of Care (Signed)
  Problem: Health Behavior/Discharge Planning: Goal: Ability to manage health-related needs will improve Outcome: Progressing   Problem: Clinical Measurements: Goal: Ability to maintain clinical measurements within normal limits will improve Outcome: Progressing   

## 2019-11-29 NOTE — Consult Note (Signed)
Palliative care consult note  Reason for consult: Goals of care in light of advanced heart failure and lung cancer  Palliative care consult received.  Chart reviewed including personal review of pertinent labs and imaging.  Briefly, Mr. Dufault is a 74 year old male with past medical history of bladder cancer, abdominal aortic aneurysm, anxiety, alcoholism, GERD, hypertension, squamous cell carcinoma of the left lung status post SBRT who presented to the ED with worsening shortness of breath and cough is currently being treated for congestive heart failure as well as concern for potential pneumonia/postobstructive symptoms from prior lung cancer.  Palliative consulted for goals of care.  I met today with Mr. Broughton and his stepson/HC POA, Shanon Brow.  I introduced palliative care as specialized medical care for people living with serious illness. It focuses on providing relief from the symptoms and stress of a serious illness. The goal is to improve quality of life for both the patient and the family.  Mr. Micheletti is a widower who lives alone.  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  Currently, he reports being very inactive and focuses his day on watching TV and working crosswords and other activities in the daily newspaper.  We discussed his multiple medical problems as well as changes in his nutrition, cognition, and functional status being indicators of potential disease progression in light of his chronic medical problems.  We discussed clinical course as well as wishes moving forward in regard to advanced directives.  Concepts specific to code status and rehospitalization discussed.  We discussed difference between a aggressive medical intervention path and a palliative, comfort focused care path.  Values and goals of care important to patient and family were attempted to be elicited.  We reviewed a MOST form and discussed how to develop plan of care to focus on continuing  therapies that would maximize chance of being well enough to return home and limiting therapies not in line with this goal.  We discussed regarding heroic interventions at the end-of-life. There is agreement this would not be in line with expressed wishes for a natural death or be likely to lead to getting well enough to go back home.  He and Shanon Brow are in agreement with changing CODE STATUS to DO NOT RESUSCITATE.  We discussed that the hospital can be useful as long as he is getting well enough from care he receives at the hospital to enjoy his time outside of the hospital, but there is going to come a time where, if his goal is to be at home, he may be better served to plan on being at home and bringing care to him at home rather repeated trips to the hospital. We discussed hospice as a tool that may be beneficial in this goal when he reaches a point where we are trying to fix problems that are not fixable.  He is in agreement that a good plan would be to plan transition to skilled facility for rehab as they have previously been arranging. He has done well with rehabbing in the past. If he does well at home and continues to thrive, I encouraged they continue with this plan. If, however he is unable to regain function and he continues to decline, I recommended that he be followed by outpatient palliative care to determine if he may be better served by focusing his care on staying at home with support of organization such as hospice.  Recommendations: - DNR/DNI - We completed MOST form today. DNR,  Limited additional interventions, IVF and ABX if indicated, no feeding tube. - Concepts of hospice and palliative care were discussed.  Recommend palliative care to continue to follow him as an outpatient at skilled facility.  Questions and concerns addressed.   PMT will continue to support holistically.  Start time: 0945 End time: 1110 Total time: 85 minutes  Greater than 50%  of this time was spent  counseling and coordinating care related to the above assessment and plan.  Micheline Rough, MD [place] Team [phone]

## 2019-11-29 NOTE — Plan of Care (Signed)
  Problem: Health Behavior/Discharge Planning: Goal: Ability to manage health-related needs will improve 11/29/2019 1433 by Zadie Rhine, RN Outcome: Progressing 11/29/2019 1319 by Zadie Rhine, RN Outcome: Progressing   Problem: Clinical Measurements: Goal: Ability to maintain clinical measurements within normal limits will improve 11/29/2019 1433 by Zadie Rhine, RN Outcome: Progressing 11/29/2019 1319 by Zadie Rhine, RN Outcome: Progressing

## 2019-11-29 NOTE — Discharge Summary (Addendum)
Physician Discharge Summary  [name]:[id number] DOB: July 26, 1945 DOA: 11/20/2019  PCP: [name], MD  Admit date: 11/20/2019 Discharge date: 11/29/2019  Admitted From: Home Disposition: SNF  Recommendations for Outpatient Follow-up:  1. Follow up with PCP in 1-2 weeks 2. Follow-up with oncology in outpatient setting 3. Palliative Care to follow up at SNF 4. Follow up with Cardiology as an outpatient  5. Please obtain CMP/CBC, Mag, Phos in one week 6. Please follow up on the following pending results:  Home Health: No Equipment/Devices: None   Discharge Condition: Stable CODE STATUS: DO NOT RESUSCITATE  Diet recommendation: Heart Healthy Diet with 1500 mL Fluid Restriction  Brief/Interim Summary: HPI per Dr. Gala Romney on 11/20/19 Tyler Gutter Swensonis a 74 y.o.malewith medical history significant ofbladder cancer, abdominal aortic aneurysm, chronic anxiety, alcoholism, GERD, hypertension, squamous cell carcinoma of the left lung who was diagnosed previously in 2018. Followed by Dr. [name] and Dr. Earlie Server. He was also seen by Dr. [name]. He has stereotactic radiation therapy into 08/25/2017. Patient appears to have lost follow-up since last year. He was referred for continued oncology follow-up. Still Thoracics surgery was evaluated patient for his resection. He came to the ER today complaining of shortness of breath cough and feeling terrible. He is slightly tremulous. He still drinks alcohol and has had drinks today. Patient has been on chronic pain medication as well. His evaluation today showed increasing bilateral lower extremity edema with chronic stasis, has run out of his oral Lasix for a while. His BNP is elevated but CT and x-ray of the chest do not showed overt CHF. Is being admitted to the hospital for further evaluation and treatment..  ED Course:Temperature 98.2 blood pressure 177/84, pulse 130 respirate of 25 oxygen sat 82% on  room air. White count is 8.3 hemoglobin 9.7 platelets 300. BNP of 1251 LDH 145 triglyceride 74 ferritin 13 CRP 1.3 lactic acid 1.4 procalcitonin less than 0.1. COVID-19 screen is negative. CT angiogram of the chest showed small right pleural effusion with 3.5 x 2.7 cm mass in the left upper lobe no evidence of PE. Patient being admitted to the hospital with acute respiratory failure with hypoxia probably related to his malignancy, pleural effusions pneumonia or CHF.  **Interim History  Patient was diuresed and changed to p.o. diuresis.  He has improved significantly from his respiratory status and has been deemed stable for discharge as he is diuresed almost 10 L and dropped almost 17 pounds.  Patient is improved and respiratory status is stable and back on his chronic baseline and he will need to follow-up with PCP, palliative care, cardiology and oncology in outpatient setting.  Palliative met with the patient and he is now DNR.  Will need to follow-up and he understands agrees with plan of care.  ADDENDUM 11/30/19: Patient was unfortunately not discharged yesterday due to transportation issues and was not seen this AM as he left prior to being evaluated. He was currently stable to be discharged today and no acute events overnight reported by nursing.  Nursing reports that he asked for his pain medications but otherwise remains stable.  Will need to follow-up with PCP and oncology outpatient setting with palliative care to follow at the skilled nursing facility.  Discharge Diagnoses:  Principal Problem:   Acute on chronic respiratory failure with hypoxia ([name]) Active Problems:   Malignant neoplasm of bronchus of left upper lobe ([name])   Alcohol abuse   Tobacco dependence   Acute on chronic respiratory failure with  hypoxemia ([name])  Acute on Chronic Hypoxemic Respiratory Failure -Improved,currently at baseline.  -Patient initially required 5 L/min of oxygen via nasal cannula,.  -He has  underlying diastolic CHF. BNP was elevated to 1200. Patient ran out of oral Lasix.  -Patient also had pneumonia and postobstructive symptoms from lung cancer. Complete Abx -Patient was started on Lasix 40 mg IV every 12 hours. He has diuresed well. IV Lasix was discontinued and started on Lasix 40 mg p.o. daily and will double today for the next 3 days and then go back to 40 mg daily -SpO2: 97 % O2 Flow Rate (L/min): 3 L/min -Continue to Monitor Respiratory Status carefully and continue supplemental oxygen via nasal cannula and wean O2 as tolerated -C/w Guaifenesin 600 mg po BID -Continuous pulse oximetry maintain O2 saturations greater than 90%  Acute on Chronic Diastolic CHF, improved -Echocardiogram done today shows grade 2 diastolic dysfunction, EF 76%.  -Started on Lasix as above.  -BNP is elevated at 1200.  -Foley catheter had been inserted as patient unable to get up to urinate due to ongoing worsening respiratory status.  -Condom catheter could not be placed due to patient's anatomy.  -IV Lasix has been discontinued. Patient started on Lasix 40 mg p.o. daily and will increase to 40 mg p.o. twice daily for 3 days and then go back down to 40 daily -Patient is -17 lbs and he is - 8.782 liters weight is not been done today -Continue to Monitor for S/Sx of Volume Overload -Repeat CXR showed "Probable small amount of layering pleural fluid on the right with mild right base atelectasis. Small pleural effusion on the left with associated atelectasis. Postoperative scarring in the left chest. There is probably less pleural fluid than was visible 1 week ago. No worsening or new finding." -Continue to monitor volume status carefully and follow-up in outpatient setting with cardiology  Alcohol Abuse -No signs and symptoms of alcohol withdrawal. Continueas needed Ativan withCIWA protocol. -C/w Folic Acid 1 mg po Daily, MVI+ Minerals, and Thiamine 100 mg po Daily  GERD -C/w  Pantoprazole 40 mg po BID  Hypokalemia -Improved and K+ is 4.3 yesterday and today is 4.6 -Continue to Monitor and Replete as Necessary -Repeat CMP in the AM   Disposition -PT evaluation obtained , recommend skilled nursing facility.Awaiting bed at skilled nursing facility and this this has been obtained and will be discharged today  Non-Small Cell Lung Cancer Chronic Pain Syndrome -patient has non-small cell lung cancer cavitary mass in left upper lobe is stable for past 1 year.  -No recurrence of cancer.  -He has not followed with oncology in over a year. Recommended to follow-up with oncology as outpatient. -Takes chronic oxycodone 20 mg every 4 hours scheduled and will change to as needed at discharge and will likely need referral to pain clinic  Bilateral Pleural Effusion, stable -Patient has chronic small bilateral pleural effusion which has been stable for past many months.  -He at one point had Pleurx catheter and left pleural cavity which was removed by CT surgery Dr. Servando Snare. -Patient had pleural fluid cytology which was negative for malignant cells on 04/07/2018 -Repeat CXR "Bilateral pleural effusions, left more than right. Less pleural fluid than was seen 1 week ago. No worsening or new finding."  Goals of Care -Dr. Darrick Meigs discussed with patient stepson at bedside he would like to have palliative care discussion to discuss CODE STATUS and goals of care.  -Palliative care consultation pending and family meeting scheduled for 11/29/19  at 10 AM  Normocytic Anemia likely secondary to iron deficiency anemia Vitamin B12 deficiency -Patient's Hgb/Hct went from 9.0/32.3 -> 9.5/32.6 -> today was 9.8/34.5 -Ferritin level was 13 -Checked anemia panel this morning showed an iron level of 39, U IBC of 432, TIBC of 471, saturation ratios of 8%, ferritin level 38, folate level 27.6, vitamin B12 level of 154 -We will start supplementation and start Niferex 150 mg p.o. daily  and also vitamin B12 supplementation with cyanocobalamin 1000 mcg p.o. daily -Continue to Monitor for S/Sx of Bleeding; Currently no overt bleeding noted -Repeat CBC in the AM   Overweight -Estimated body mass index is 26.03 kg/m as calculated from the following:   Height as of this encounter: _0  (1.778 m).   Weight as of this encounter: 82.3 kg. -Nutritionist consulted for further evaluation and recommendations -Going to discontinue Ensure Enlive and Prosource plus in order to boost breeze twice daily and Magic cup twice daily  Anxiety -C/w Clonazepam 1 mg po TIDprn Anxiety  while hospitalized and will go back on the 1 mg p.o. nightly and start hydroxyzine 25 mg 3 times daily  Discharge Instructions Discharge Instructions    (HEART FAILURE PATIENTS) Call MD:  Anytime you have any of the following symptoms: 1) 3 pound weight gain in 24 hours or 5 pounds in 1 week 2) shortness of breath, with or without a dry hacking cough 3) swelling in the hands, feet or stomach 4) if you have to sleep on extra pillows at night in order to breathe.   Complete by: As directed    Call MD for:  difficulty breathing, headache or visual disturbances   Complete by: As directed    Call MD for:  extreme fatigue   Complete by: As directed    Call MD for:  hives   Complete by: As directed    Call MD for:  persistant dizziness or light-headedness   Complete by: As directed    Call MD for:  persistant nausea and vomiting   Complete by: As directed    Call MD for:  redness, tenderness, or signs of infection (pain, swelling, redness, odor or green/yellow discharge around incision site)   Complete by: As directed    Call MD for:  severe uncontrolled pain   Complete by: As directed    Call MD for:  temperature >100.4   Complete by: As directed    Diet - low sodium heart healthy   Complete by: As directed    Discharge instructions   Complete by: As directed    You were cared for by a hospitalist during  your hospital stay. If you have any questions about your discharge medications or the care you received while you were in the hospital after you are discharged, you can call the unit and ask to speak with the hospitalist on call if the hospitalist that took care of you is not available. Once you are discharged, your primary care physician will handle any further medical issues. Please note that NO REFILLS for any discharge medications will be authorized once you are discharged, as it is imperative that you return to your primary care physician (or establish a relationship with a primary care physician if you do not have one) for your aftercare needs so that they can reassess your need for medications and monitor your lab values.  Follow up with PCP, Palliative Care, and Oncology in the outpatient setting. Take all medications as prescribed. If symptoms change or  worsen please return to the ED for evaluation   Increase activity slowly   Complete by: As directed      Allergies as of 11/29/2019   No Known Allergies     Medication List    STOP taking these medications   amLODipine 10 MG tablet Commonly known as: NORVASC   docusate sodium 100 MG capsule Commonly known as: COLACE     TAKE these medications   acetaminophen 325 MG tablet Commonly known as: TYLENOL Take 2 tablets (650 mg total) by mouth every 4 (four) hours as needed for headache or mild pain.   clonazePAM 1 MG tablet Commonly known as: KLONOPIN Take 1 tablet (1 mg total) by mouth at bedtime as needed for anxiety.   cyanocobalamin 1000 MCG tablet Take 1 tablet (1,000 mcg total) by mouth daily.   folic acid 1 MG tablet Commonly known as: FOLVITE Take 1 tablet (1 mg total) by mouth daily. Start taking on: November 30, 2019   furosemide 40 MG tablet Commonly known as: LASIX Take 40 mg p.o. twice daily for 3 days and then go back to 40 mg daily from then on What changed:   medication strength  how much to take  how  to take this  when to take this  additional instructions   gabapentin 100 MG capsule Commonly known as: NEURONTIN Take 1 capsule (100 mg total) by mouth 3 (three) times daily.   guaiFENesin 600 MG 12 hr tablet Commonly known as: MUCINEX Take 1 tablet (600 mg total) by mouth 2 (two) times daily for 5 days.   hydrOXYzine 25 MG tablet Commonly known as: ATARAX/VISTARIL Take 1 tablet (25 mg total) by mouth 3 (three) times daily as needed for anxiety.   iron polysaccharides 150 MG capsule Commonly known as: NIFEREX Take 1 capsule (150 mg total) by mouth daily. Start taking on: November 30, 2019   multivitamin with minerals Tabs tablet Take 1 tablet by mouth daily. Start taking on: November 30, 2019   Oxycodone HCl 20 MG Tabs Take 1 tablet (20 mg total) by mouth every 4 (four) hours as needed for up to 2 days. What changed:   when to take this  reasons to take this  Another medication with the same name was removed. Continue taking this medication, and follow the directions you see here.   pantoprazole 40 MG tablet Commonly known as: PROTONIX Take 1 tablet (40 mg total) by mouth 2 (two) times daily.   senna 8.6 MG tablet Commonly known as: SENOKOT Take 1 tablet by mouth daily.   thiamine 100 MG tablet Take 1 tablet (100 mg total) by mouth daily. Start taking on: November 30, 2019       Contact information for after-discharge care    [facility] .   Service: Skilled Nursing Contact information: [address] [place] [phone]                 No Known Allergies  Consultations:  Palliative care  Procedures/Studies: DG Chest 2 View  Result Date: 11/20/2019 CLINICAL DATA:  Shortness of breath. EXAM: CHEST - 2 VIEW COMPARISON:  October 02, 2019. FINDINGS: Stable cardiomediastinal silhouette. Right lung is clear. Left upper lobe nodule or mass is enlarged compared to prior exam  concerning for malignancy. Stable loculated left pleural effusion is noted with associated atelectasis or infiltrate. Bony thorax is unremarkable. IMPRESSION: Left upper lobe nodule or mass is enlarged  compared to prior exam concerning for malignancy. Stable loculated left pleural effusion with associated atelectasis or infiltrate. Electronically Signed   By: Marijo Conception M.D.   On: 11/20/2019 15:55   CT Angio Chest PE W and/or Wo Contrast  Result Date: 11/20/2019 CLINICAL DATA:  Shortness of breath, productive cough. History of lung cancer. EXAM: CT ANGIOGRAPHY CHEST WITH CONTRAST TECHNIQUE: Multidetector CT imaging of the chest was performed using the standard protocol during bolus administration of intravenous contrast. Multiplanar CT image reconstructions and MIPs were obtained to evaluate the vascular anatomy. CONTRAST:  171m OMNIPAQUE IOHEXOL 350 MG/ML SOLN COMPARISON:  September 28, 2019. FINDINGS: Cardiovascular: Satisfactory opacification of the pulmonary arteries to the segmental level. No evidence of pulmonary embolism. Normal heart size. No pericardial effusion. Coronary artery calcifications are noted. Mediastinum/Nodes: No enlarged mediastinal, hilar, or axillary lymph nodes. Thyroid gland, trachea, and esophagus demonstrate no significant findings. Lungs/Pleura: No pneumothorax is noted. Emphysematous disease is noted in the upper lobes. Small right pleural effusion is noted with adjacent subsegmental atelectasis of the right lower lobe. Mild left posterior basilar subsegmental atelectasis is noted. Grossly stable 3.5 x 2.7 cm mass is noted in left upper lobe consistent with malignancy and or post treatment change. Upper Abdomen: No acute abnormality. Musculoskeletal: No chest wall abnormality. No acute or significant osseous findings. Review of the MIP images confirms the above findings. IMPRESSION: 1. No definite evidence of pulmonary embolus. 2. Coronary artery calcifications are noted  suggesting coronary artery disease. 3. Small right pleural effusion is noted with adjacent subsegmental atelectasis of the right lower lobe. 4. Grossly stable 3.5 x 2.7 cm mass is noted in left upper lobe consistent with malignancy and or post treatment change. 5. Emphysema and aortic atherosclerosis. Aortic Atherosclerosis (ICD10-I70.0) and Emphysema (ICD10-J43.9). Electronically Signed   By: JMarijo ConceptionM.D.   On: 11/20/2019 18:58   DG CHEST PORT 1 VIEW  Result Date: 11/28/2019 CLINICAL DATA:  Shortness of breath over the last 6 days. History of lung cancer and pleural effusion. EXAM: PORTABLE CHEST 1 VIEW COMPARISON:  11/21/2019 FINDINGS: Probable small amount of layering pleural fluid on the right with mild right base atelectasis. Small pleural effusion on the left with associated atelectasis. Postoperative scarring in the left chest. There is probably less pleural fluid than was visible 1 week ago. No worsening or new finding. IMPRESSION: Bilateral pleural effusions, left more than right. Less pleural fluid than was seen 1 week ago. No worsening or new finding. Electronically Signed   By: MNelson ChimesM.D.   On: 11/28/2019 09:09   DG Chest Port 1 View  Result Date: 11/21/2019 CLINICAL DATA:  Shortness of breath EXAM: PORTABLE CHEST 1 VIEW COMPARISON:  11/20/2019 FINDINGS: Small right pleural effusion and adjacent atelectasis. Small left pleural effusion and adjacent atelectasis. Similar patchy left lung opacification with left upper lobe mass. No pneumothorax. Similar cardiomediastinal contours. IMPRESSION: No substantial change since prior study. Persistent pleural effusions and adjacent atelectasis. Left upper lobe mass with patchy left lung atelectasis/consolidation. Electronically Signed   By: PMacy MisM.D.   On: 11/21/2019 11:20   ECHOCARDIOGRAM COMPLETE  Result Date: 11/21/2019    ECHOCARDIOGRAM REPORT   Patient Name:   Tyler BINSFELDDate of Exam: 11/21/2019 Medical Rec #:   [id number]            Height:       70.0 in Accession #:    [id number]  Weight:       174.2 lb Date of Birth:  [birth year]             BSA:          1.968 m Patient Age:    74 years              BP:           158/72 mmHg Patient Gender: M                     HR:           85 bpm. Exam Location:  Inpatient Procedure: 2D Echo, Color Doppler and Cardiac Doppler Indications:     D62.22 Acute diastolic (congestive) heart failure  History:         Patient has prior history of Echocardiogram examinations, most                  recent 09/28/2019. Risk Factors:Hypertension, Current Smoker and                  ETOH Abuse. Lung Cancer.  Sonographer:     Raquel Sarna Senior RDCS Referring Phys:  [place] Diagnosing Phys: Gwyndolyn Kaufman MD IMPRESSIONS  1. Left ventricular ejection fraction, by estimation, is 60 to 65%. The left ventricle has normal function. The left ventricle has no regional wall motion abnormalities. There is assymetric severe hypertrophy of the basal septum with mild concentric hypertrophy of the other LV segments. Left ventricular diastolic parameters are consistent with Grade II diastolic dysfunction (pseudonormalization).  2. Right ventricular systolic function is normal. The right ventricular size is mildly enlarged.  3. Right atrial size was moderately dilated.  4. The mitral valve is grossly normal. Mild mitral valve regurgitation.  5. The aortic valve is tricuspid. There is mild calcification of the aortic valve. There is mild thickening of the aortic valve. Aortic valve regurgitation is not visualized. Mild to moderate aortic valve sclerosis/calcification is present, without any evidence of aortic stenosis.  6. The inferior vena cava is dilated in size with <50% respiratory variability, suggesting right atrial pressure of 15 mmHg.  7. Aortic dilatation noted. There is borderline dilatation of the aortic root, measuring 38 mm. Comparison(s): Compared to prior study on 09/2019, there  is grade II diastolic dysfunction and the RV appears mildly dilated. Otherwise, no significant changes. FINDINGS  Left Ventricle: Left ventricular ejection fraction, by estimation, is 60 to 65%. The left ventricle has normal function. The left ventricle has no regional wall motion abnormalities. The left ventricular internal cavity size was normal in size. There is  assymetric severe hypertrophy of the basal septum with mild concentric hypertrophy of the other LV segments. Left ventricular diastolic parameters are consistent with Grade II diastolic dysfunction (pseudonormalization). Right Ventricle: The right ventricular size is mildly enlarged. No increase in right ventricular wall thickness. Right ventricular systolic function is normal. Left Atrium: Left atrial size was normal in size. Right Atrium: Right atrial size was moderately dilated. Pericardium: There is no evidence of pericardial effusion. Mitral Valve: The mitral valve is grossly normal. There is mild thickening of the mitral valve leaflet(s). Mild mitral annular calcification. Mild mitral valve regurgitation. Tricuspid Valve: The tricuspid valve is normal in structure. Tricuspid valve regurgitation is trivial. Aortic Valve: The aortic valve is tricuspid. There is mild calcification of the aortic valve. There is mild thickening of the aortic valve. Aortic valve regurgitation is not visualized. Mild to moderate aortic  valve sclerosis/calcification is present, without any evidence of aortic stenosis. Pulmonic Valve: The pulmonic valve was normal in structure. Pulmonic valve regurgitation is not visualized. Aorta: Aortic dilatation noted. There is borderline dilatation of the aortic root, measuring 38 mm. Venous: The inferior vena cava is dilated in size with less than 50% respiratory variability, suggesting right atrial pressure of 15 mmHg. IAS/Shunts: No atrial level shunt detected by color flow Doppler.  LEFT VENTRICLE PLAX 2D LVIDd:         4.10 cm   Diastology LVIDs:         2.50 cm  LV e' medial:    7.94 cm/s LV PW:         1.20 cm  LV E/e' medial:  14.9 LV IVS:        1.30 cm  LV e' lateral:   9.14 cm/s LVOT diam:     2.20 cm  LV E/e' lateral: 12.9 LV SV:         90 LV SV Index:   46 LVOT Area:     3.80 cm  RIGHT VENTRICLE RV S prime:     13.50 cm/s TAPSE (M-mode): 2.3 cm LEFT ATRIUM             Index       RIGHT ATRIUM           Index LA diam:        3.90 cm 1.98 cm/m  RA Area:     26.60 cm LA Vol (A2C):   56.2 ml 28.55 ml/m RA Volume:   85.90 ml  43.64 ml/m LA Vol (A4C):   30.3 ml 15.39 ml/m LA Biplane Vol: 44.1 ml 22.40 ml/m  AORTIC VALVE LVOT Vmax:   105.00 cm/s LVOT Vmean:  77.200 cm/s LVOT VTI:    0.238 m  AORTA Ao Root diam: 3.70 cm Ao Asc diam:  3.20 cm MITRAL VALVE MV Area (PHT): 3.72 cm     SHUNTS MV Decel Time: 204 msec     Systemic VTI:  0.24 m MV E velocity: 118.00 cm/s  Systemic Diam: 2.20 cm MV A velocity: 98.10 cm/s MV E/A ratio:  1.20 Gwyndolyn Kaufman MD Electronically signed by Gwyndolyn Kaufman MD Signature Date/Time: 11/21/2019/10:40:50 AM    Final (Updated)    VAS [place] LOWER EXTREMITY VENOUS (DVT) ([facility] and [facility] 7a-7p)  Result Date: 11/20/2019  Lower Venous DVTStudy Indications: Edema.  Risk Factors: None identified. Limitations: Body habitus and poor ultrasound/tissue interface. Comparison Study: No prior studies. Performing Technologist: Oliver Hum RVT  Examination Guidelines: A complete evaluation includes B-mode imaging, spectral Doppler, color Doppler, and power Doppler as needed of all accessible portions of each vessel. Bilateral testing is considered an integral part of a complete examination. Limited examinations for reoccurring indications may be performed as noted. The reflux portion of the exam is performed with the patient in reverse Trendelenburg.  +---------+---------------+---------+-----------+----------+--------------+ RIGHT    CompressibilityPhasicitySpontaneityPropertiesThrombus Aging  +---------+---------------+---------+-----------+----------+--------------+ CFV      Full           Yes      Yes                                 +---------+---------------+---------+-----------+----------+--------------+ SFJ      Full                                                        +---------+---------------+---------+-----------+----------+--------------+  FV Prox  Full                                                        +---------+---------------+---------+-----------+----------+--------------+ FV Mid                  Yes      Yes                                 +---------+---------------+---------+-----------+----------+--------------+ FV Distal               Yes      Yes                                 +---------+---------------+---------+-----------+----------+--------------+ PFV      Full                                                        +---------+---------------+---------+-----------+----------+--------------+ POP      Full           Yes      Yes                                 +---------+---------------+---------+-----------+----------+--------------+ PTV                                                   Not visualized +---------+---------------+---------+-----------+----------+--------------+ PERO                                                  Not visualized +---------+---------------+---------+-----------+----------+--------------+   +---------+---------------+---------+-----------+----------+--------------+ LEFT     CompressibilityPhasicitySpontaneityPropertiesThrombus Aging +---------+---------------+---------+-----------+----------+--------------+ CFV      Full           Yes      Yes                                 +---------+---------------+---------+-----------+----------+--------------+ SFJ      Full                                                         +---------+---------------+---------+-----------+----------+--------------+ FV Prox  Full                                                        +---------+---------------+---------+-----------+----------+--------------+ FV Mid   Full                                                        +---------+---------------+---------+-----------+----------+--------------+  FV Distal               Yes      Yes                                 +---------+---------------+---------+-----------+----------+--------------+ PFV      Full                                                        +---------+---------------+---------+-----------+----------+--------------+ POP      Full           Yes      Yes                                 +---------+---------------+---------+-----------+----------+--------------+ PTV      Full                                                        +---------+---------------+---------+-----------+----------+--------------+ PERO                                                  Not visualized +---------+---------------+---------+-----------+----------+--------------+     Summary: RIGHT: - There is no evidence of deep vein thrombosis in the lower extremity. However, portions of this examination were limited- see technologist comments above.  - No cystic structure found in the popliteal fossa.  LEFT: - There is no evidence of deep vein thrombosis in the lower extremity. However, portions of this examination were limited- see technologist comments above.  - No cystic structure found in the popliteal fossa.  *See table(s) above for measurements and observations. Electronically signed by Deitra Mayo MD on 11/20/2019 at 8:14:37 PM.    Final     Subjective: Seen and examined at bedside and the patient was doing fairly well.  Denied any complaints.  No nausea or vomiting.  Still has a cough but states that Mucinex helped.  Had a palliative care meeting and  is now currently DNR.  Stable to be discharged as he has been significantly improved from the time of admission.  Will need to follow-up with PCP, palliative care, cardiology, as well as oncology in outpatient setting and he understands agrees with plan of care.  Discharge Exam: Vitals:   11/29/19 0535 11/29/19 1349  BP: 133/61 135/67  Pulse: 78 82  Resp:  13  Temp: 98.7 F (37.1 C) 97.9 F (36.6 C)  SpO2: 95% 97%   Vitals:   11/28/19 1318 11/28/19 2054 11/29/19 0535 11/29/19 1349  BP: 127/61 (!) 130/58 133/61 135/67  Pulse: 75 74 78 82  Resp: _0 Temp: 98.3 F (36.8 C) 98 F (36.7 C) 98.7 F (37.1 C) 97.9 F (36.6 C)  TempSrc: Oral Oral Oral Oral  SpO2: 94% 97% 95% 97%  Weight:      Height:       General: Pt is alert, awake, not in acute  distress Cardiovascular: RRR, S1/S2 +, no rubs, no gallops Respiratory: Diminished bilaterally with slight crackles, no wheezing, no rhonchi; wearing supplemental oxygen via nasal cannula Abdominal: Soft, NT, slightly distended, bowel sounds + Extremities: Mild lower extremity edema, no cyanosis  The results of significant diagnostics from this hospitalization (including imaging, microbiology, ancillary and laboratory) are listed below for reference.    Microbiology: Recent Results (from the past 240 hour(s))  Respiratory Panel by RT PCR (Flu A&B, Covid) - Nasopharyngeal Swab     Status: None   Collection Time: 11/20/19  3:47 PM   Specimen: Nasopharyngeal Swab  Result Value Ref Range Status   SARS Coronavirus 2 by RT PCR NEGATIVE NEGATIVE Final    Comment: (NOTE) SARS-CoV-2 target nucleic acids are NOT DETECTED.  The SARS-CoV-2 RNA is generally detectable in upper respiratoy specimens during the acute phase of infection. The lowest concentration of SARS-CoV-2 viral copies this assay can detect is 131 copies/mL. A negative result does not preclude SARS-Cov-2 infection and should not be used as the sole basis for treatment  or other patient management decisions. A negative result may occur with  improper specimen collection/handling, submission of specimen other than nasopharyngeal swab, presence of viral mutation(s) within the areas targeted by this assay, and inadequate number of viral copies (<131 copies/mL). A negative result must be combined with clinical observations, patient history, and epidemiological information. The expected result is Negative.  Fact Sheet for Patients:  PinkCheek.be  Fact Sheet for Healthcare Providers:  GravelBags.it  This test is no t yet approved or cleared by the [place] FDA and  has been authorized for detection and/or diagnosis of SARS-CoV-2 by FDA under an Emergency Use Authorization (EUA). This EUA will remain  in effect (meaning this test can be used) for the duration of the COVID-19 declaration under Section 564(b)(1) of the Act, 21 U.S.C. section 360bbb-3(b)(1), unless the authorization is terminated or revoked sooner.     Influenza A by PCR NEGATIVE NEGATIVE Final   Influenza B by PCR NEGATIVE NEGATIVE Final    Comment: (NOTE) The Xpert Xpress SARS-CoV-2/FLU/RSV assay is intended as an aid in  the diagnosis of influenza from Nasopharyngeal swab specimens and  should not be used as a sole basis for treatment. Nasal washings and  aspirates are unacceptable for Xpert Xpress SARS-CoV-2/FLU/RSV  testing.  Fact Sheet for Patients: PinkCheek.be  Fact Sheet for Healthcare Providers: GravelBags.it  This test is not yet approved or cleared by the [place] FDA and  has been authorized for detection and/or diagnosis of SARS-CoV-2 by  FDA under an Emergency Use Authorization (EUA). This EUA will remain  in effect (meaning this test can be used) for the duration of the  Covid-19 declaration under Section 564(b)(1) of the Act, 21  U.S.C.  section 360bbb-3(b)(1), unless the authorization is  terminated or revoked. Performed at [facility], [place] [address] [place], [place] [address]   Blood Culture (routine x 2)     Status: None   Collection Time: 11/20/19  3:47 PM   Specimen: BLOOD  Result Value Ref Range Status   Specimen Description   Final    BLOOD LEFT ANTECUBITAL Performed at [facility] [address] [place], [place] [address]    Special Requests   Final    BOTTLES DRAWN AEROBIC AND ANAEROBIC Blood Culture results may not be optimal due to an excessive volume of blood received in culture bottles Performed at [facility]  [address] [place], [place] [address]    Culture   Final    NO GROWTH 5 DAYS Performed at [facility], [place] [address], [place],  [address]    Report Status 11/25/2019 FINAL  Final    Labs: BNP (last 3 results) Recent Labs    09/27/19 0403 11/20/19 1547  BNP 609.5* 6,226.3*   Basic Metabolic Panel: Recent Labs  Lab 11/23/19 0344 11/23/19 0344 11/24/19 0518 11/25/19 0429 11/27/19 0405 11/28/19 0353 11/29/19 0337  NA 139  --  138 139  --  136 136  K 3.2*  --  4.2 3.9  --  4.3 4.6  CL 97*  --  97* 96*  --  94* 91*  CO2 33*  --  32 33*  --  34* 32  GLUCOSE 84  --  101* 119*  --  101* 101*  BUN 18  --  15 14  --  18 17  CREATININE 0.79   < > 0.74 0.79 0.94 0.97 0.87  CALCIUM 8.0*  --  8.5* 8.7*  --  8.7* 9.0  MG  --   --   --   --   --   --  2.3  PHOS  --   --   --   --   --   --  3.6   < > = values in this interval not displayed.   Liver Function Tests: No results for input(s): AST, ALT, ALKPHOS, BILITOT, PROT, ALBUMIN in the last 168 hours. No results for input(s): LIPASE, AMYLASE in the last 168 hours. No results for input(s): AMMONIA in the last 168 hours. CBC: Recent Labs  Lab 11/28/19 1103 11/29/19 0337  WBC 6.7 6.2  NEUTROABS 4.6 4.2  HGB 9.5* 9.8*  HCT 32.6* 34.5*  MCV 83.8 85.6   PLT 311 316   Cardiac Enzymes: No results for input(s): CKTOTAL, CKMB, CKMBINDEX, TROPONINI in the last 168 hours. BNP: Invalid input(s): POCBNP CBG: No results for input(s): GLUCAP in the last 168 hours. D-Dimer No results for input(s): DDIMER in the last 72 hours. Hgb A1c No results for input(s): HGBA1C in the last 72 hours. Lipid Profile No results for input(s): CHOL, HDL, LDLCALC, TRIG, CHOLHDL, LDLDIRECT in the last 72 hours. Thyroid function studies No results for input(s): TSH, T4TOTAL, T3FREE, THYROIDAB in the last 72 hours.  Invalid input(s): FREET3 Anemia work up Recent Labs    11/29/19 0337  VITAMINB12 154*  FOLATE 27.6  FERRITIN 38  TIBC 471*  IRON 39*  RETICCTPCT 1.6   Urinalysis    Component Value Date/Time   COLORURINE YELLOW 03/31/2018 0630   APPEARANCEUR CLEAR 03/31/2018 0630   LABSPEC 1.042 (H) 03/31/2018 0630   PHURINE 5.0 03/31/2018 0630   GLUCOSEU NEGATIVE 03/31/2018 0630   HGBUR SMALL (A) 03/31/2018 0630   BILIRUBINUR NEGATIVE 03/31/2018 0630   KETONESUR NEGATIVE 03/31/2018 0630   PROTEINUR NEGATIVE 03/31/2018 0630   NITRITE NEGATIVE 03/31/2018 0630   LEUKOCYTESUR NEGATIVE 03/31/2018 0630   Sepsis Labs Invalid input(s): PROCALCITONIN,  WBC,  LACTICIDVEN Microbiology Recent Results (from the past 240 hour(s))  Respiratory Panel by RT PCR (Flu A&B, Covid) - Nasopharyngeal Swab     Status: None   Collection Time: 11/20/19  3:47 PM   Specimen: Nasopharyngeal Swab  Result Value Ref Range Status   SARS Coronavirus 2 by RT PCR NEGATIVE NEGATIVE Final    Comment: (NOTE) SARS-CoV-2 target nucleic acids are NOT DETECTED.  The SARS-CoV-2 RNA is generally detectable in upper  respiratoy specimens during the acute phase of infection. The lowest concentration of SARS-CoV-2 viral copies this assay can detect is 131 copies/mL. A negative result does not preclude SARS-Cov-2 infection and should not be used as the sole basis for treatment or other  patient management decisions. A negative result may occur with  improper specimen collection/handling, submission of specimen other than nasopharyngeal swab, presence of viral mutation(s) within the areas targeted by this assay, and inadequate number of viral copies (<131 copies/mL). A negative result must be combined with clinical observations, patient history, and epidemiological information. The expected result is Negative.  Fact Sheet for Patients:  PinkCheek.be  Fact Sheet for Healthcare Providers:  GravelBags.it  This test is no t yet approved or cleared by the [place] FDA and  has been authorized for detection and/or diagnosis of SARS-CoV-2 by FDA under an Emergency Use Authorization (EUA). This EUA will remain  in effect (meaning this test can be used) for the duration of the COVID-19 declaration under Section 564(b)(1) of the Act, 21 U.S.C. section 360bbb-3(b)(1), unless the authorization is terminated or revoked sooner.     Influenza A by PCR NEGATIVE NEGATIVE Final   Influenza B by PCR NEGATIVE NEGATIVE Final    Comment: (NOTE) The Xpert Xpress SARS-CoV-2/FLU/RSV assay is intended as an aid in  the diagnosis of influenza from Nasopharyngeal swab specimens and  should not be used as a sole basis for treatment. Nasal washings and  aspirates are unacceptable for Xpert Xpress SARS-CoV-2/FLU/RSV  testing.  Fact Sheet for Patients: PinkCheek.be  Fact Sheet for Healthcare Providers: GravelBags.it  This test is not yet approved or cleared by the [place] FDA and  has been authorized for detection and/or diagnosis of SARS-CoV-2 by  FDA under an Emergency Use Authorization (EUA). This EUA will remain  in effect (meaning this test can be used) for the duration of the  Covid-19 declaration under Section 564(b)(1) of the Act, 21  U.S.C. section  360bbb-3(b)(1), unless the authorization is  terminated or revoked. Performed at [facility], [place] [address] [place], [place] [address]   Blood Culture (routine x 2)     Status: None   Collection Time: 11/20/19  3:47 PM   Specimen: BLOOD  Result Value Ref Range Status   Specimen Description   Final    BLOOD LEFT ANTECUBITAL Performed at [facility] [address], [place], [place] [address]    Special Requests   Final    BOTTLES DRAWN AEROBIC AND ANAEROBIC Blood Culture results may not be optimal due to an excessive volume of blood received in culture bottles Performed at [facility] [address], [place], [place] [address]    Culture   Final    NO GROWTH 5 DAYS Performed at [facility], [place] [address], [place], [place] [address]    Report Status 11/25/2019 FINAL  Final   Time coordinating discharge: 35 minutes  SIGNED:  Kerney Elbe, DO [facility] 11/29/2019, 2:16 PM Pager is on [place]  If 7PM-7AM, please contact night-coverage [email or web address]

## 2019-11-29 NOTE — Progress Notes (Signed)
Covid Negative, Report called to [facility], EMS dispatched, Pt prepared for transport. SRP, RN

## 2019-11-30 ENCOUNTER — Other Ambulatory Visit: Payer: Self-pay | Admitting: Internal Medicine

## 2019-11-30 MED ORDER — OXYCODONE HCL 20 MG PO TABS: 20.0000 mg | ORAL_TABLET | ORAL | 0 refills | Status: AC | PRN

## 2019-12-20 ENCOUNTER — Telehealth: Payer: Self-pay | Admitting: *Deleted

## 2019-12-20 NOTE — Telephone Encounter (Signed)
Received call from Normajean Baxter, NP with [facility] [phone].  She is evaluating patient for Palliative vs Hospice.  She wanted to know if the patient had a good prognosis based on his recent hospitalization.  Advised patient hasn't followed through on our recommendations when we last saw him in 2020.    Enouraged her to reach to PCP to see if they could give opinion on prognosis for patient or encourage him to follow back up with [place] so that we could give a better recommendation.  Patient was referred to Oncology at his recent hospitalization and patient declined that.  She is trying to gather this information to determine if he is hospice appropriate vs palliative.

## 2020-05-09 DEATH — deceased
